# Patient Record
Sex: Male | Born: 1935 | Race: White | Hispanic: No | Smoking: Former smoker
Health system: Southern US, Community
[De-identification: ages and names within clinical notes are randomized; demographics above are authoritative.]

## PROBLEM LIST (undated history)

## (undated) DIAGNOSIS — Z7901 Long term (current) use of anticoagulants: Secondary | ICD-10-CM

## (undated) DIAGNOSIS — C61 Malignant neoplasm of prostate: Secondary | ICD-10-CM

## (undated) DIAGNOSIS — G459 Transient cerebral ischemic attack, unspecified: Secondary | ICD-10-CM

## (undated) DIAGNOSIS — I4510 Unspecified right bundle-branch block: Secondary | ICD-10-CM

## (undated) DIAGNOSIS — R29898 Other symptoms and signs involving the musculoskeletal system: Secondary | ICD-10-CM

## (undated) DIAGNOSIS — M489 Spondylopathy, unspecified: Secondary | ICD-10-CM

## (undated) DIAGNOSIS — I351 Nonrheumatic aortic (valve) insufficiency: Secondary | ICD-10-CM

## (undated) DIAGNOSIS — R001 Bradycardia, unspecified: Secondary | ICD-10-CM

## (undated) DIAGNOSIS — D472 Monoclonal gammopathy: Secondary | ICD-10-CM

## (undated) DIAGNOSIS — R197 Diarrhea, unspecified: Secondary | ICD-10-CM

## (undated) DIAGNOSIS — I739 Peripheral vascular disease, unspecified: Secondary | ICD-10-CM

## (undated) DIAGNOSIS — I4891 Unspecified atrial fibrillation: Secondary | ICD-10-CM

## (undated) DIAGNOSIS — D649 Anemia, unspecified: Secondary | ICD-10-CM

## (undated) DIAGNOSIS — I340 Nonrheumatic mitral (valve) insufficiency: Secondary | ICD-10-CM

## (undated) DIAGNOSIS — I350 Nonrheumatic aortic (valve) stenosis: Secondary | ICD-10-CM

## (undated) DIAGNOSIS — IMO0002 Reserved for concepts with insufficient information to code with codable children: Secondary | ICD-10-CM

## (undated) DIAGNOSIS — J209 Acute bronchitis, unspecified: Principal | ICD-10-CM

## (undated) DIAGNOSIS — K219 Gastro-esophageal reflux disease without esophagitis: Secondary | ICD-10-CM

## (undated) DIAGNOSIS — R413 Other amnesia: Secondary | ICD-10-CM

## (undated) DIAGNOSIS — S2231XA Fracture of one rib, right side, initial encounter for closed fracture: Secondary | ICD-10-CM

## (undated) DIAGNOSIS — I779 Disorder of arteries and arterioles, unspecified: Secondary | ICD-10-CM

## (undated) DIAGNOSIS — E785 Hyperlipidemia, unspecified: Secondary | ICD-10-CM

## (undated) DIAGNOSIS — Z79899 Other long term (current) drug therapy: Secondary | ICD-10-CM

## (undated) DIAGNOSIS — N289 Disorder of kidney and ureter, unspecified: Secondary | ICD-10-CM

## (undated) DIAGNOSIS — Z9079 Acquired absence of other genital organ(s): Secondary | ICD-10-CM

## (undated) DIAGNOSIS — R9430 Abnormal result of cardiovascular function study, unspecified: Secondary | ICD-10-CM

## (undated) DIAGNOSIS — I2510 Atherosclerotic heart disease of native coronary artery without angina pectoris: Secondary | ICD-10-CM

## (undated) DIAGNOSIS — R319 Hematuria, unspecified: Secondary | ICD-10-CM

## (undated) DIAGNOSIS — R0781 Pleurodynia: Secondary | ICD-10-CM

## (undated) HISTORY — DX: Acquired absence of other genital organ(s): Z90.79

## (undated) HISTORY — DX: Bradycardia, unspecified: R00.1

## (undated) HISTORY — DX: Spondylopathy, unspecified: M48.9

## (undated) HISTORY — DX: Other symptoms and signs involving the musculoskeletal system: R29.898

## (undated) HISTORY — DX: Acute bronchitis, unspecified: J20.9

## (undated) HISTORY — DX: Pleurodynia: R07.81

## (undated) HISTORY — DX: Abnormal result of cardiovascular function study, unspecified: R94.30

## (undated) HISTORY — DX: Reserved for concepts with insufficient information to code with codable children: IMO0002

## (undated) HISTORY — DX: Disorder of arteries and arterioles, unspecified: I77.9

## (undated) HISTORY — DX: Nonrheumatic mitral (valve) insufficiency: I34.0

## (undated) HISTORY — DX: Other long term (current) drug therapy: Z79.899

## (undated) HISTORY — DX: Nonrheumatic aortic (valve) insufficiency: I35.1

## (undated) HISTORY — PX: HERNIA REPAIR: SHX51

## (undated) HISTORY — DX: Long term (current) use of anticoagulants: Z79.01

## (undated) HISTORY — PX: EYE SURGERY: SHX253

## (undated) HISTORY — DX: Unspecified right bundle-branch block: I45.10

## (undated) HISTORY — DX: Other amnesia: R41.3

## (undated) HISTORY — DX: Malignant neoplasm of prostate: C61

## (undated) HISTORY — PX: OTHER SURGICAL HISTORY: SHX169

## (undated) HISTORY — DX: Hyperlipidemia, unspecified: E78.5

## (undated) HISTORY — DX: Atherosclerotic heart disease of native coronary artery without angina pectoris: I25.10

## (undated) HISTORY — PX: COLONOSCOPY: SHX174

## (undated) HISTORY — DX: Disorder of kidney and ureter, unspecified: N28.9

## (undated) HISTORY — DX: Unspecified atrial fibrillation: I48.91

## (undated) HISTORY — PX: CATARACT EXTRACTION: SUR2

## (undated) HISTORY — DX: Hematuria, unspecified: R31.9

## (undated) HISTORY — DX: Fracture of one rib, right side, initial encounter for closed fracture: S22.31XA

## (undated) HISTORY — PX: CARDIAC CATHETERIZATION: SHX172

## (undated) HISTORY — DX: Diarrhea, unspecified: R19.7

## (undated) HISTORY — DX: Nonrheumatic aortic (valve) stenosis: I35.0

## (undated) HISTORY — DX: Peripheral vascular disease, unspecified: I73.9

## (undated) HISTORY — DX: Monoclonal gammopathy: D47.2

## (undated) HISTORY — DX: Gastro-esophageal reflux disease without esophagitis: K21.9

## (undated) HISTORY — DX: Transient cerebral ischemic attack, unspecified: G45.9

---

## 1998-04-29 ENCOUNTER — Other Ambulatory Visit: Admission: RE | Admit: 1998-04-29 | Discharge: 1998-04-29 | Payer: Self-pay | Admitting: Urology

## 1998-11-25 ENCOUNTER — Encounter: Admission: RE | Admit: 1998-11-25 | Discharge: 1999-02-23 | Payer: Self-pay | Admitting: Radiation Oncology

## 1998-11-28 ENCOUNTER — Encounter: Payer: Self-pay | Admitting: Urology

## 1998-11-28 ENCOUNTER — Ambulatory Visit (HOSPITAL_COMMUNITY): Admission: RE | Admit: 1998-11-28 | Discharge: 1998-11-28 | Payer: Self-pay | Admitting: Urology

## 1998-12-01 ENCOUNTER — Encounter: Payer: Self-pay | Admitting: Urology

## 1998-12-01 ENCOUNTER — Ambulatory Visit (HOSPITAL_COMMUNITY): Admission: RE | Admit: 1998-12-01 | Discharge: 1998-12-01 | Payer: Self-pay | Admitting: Urology

## 1998-12-16 ENCOUNTER — Ambulatory Visit (HOSPITAL_COMMUNITY): Admission: RE | Admit: 1998-12-16 | Discharge: 1998-12-16 | Payer: Self-pay | Admitting: Urology

## 1998-12-16 ENCOUNTER — Encounter: Payer: Self-pay | Admitting: Urology

## 1999-01-08 ENCOUNTER — Ambulatory Visit (HOSPITAL_BASED_OUTPATIENT_CLINIC_OR_DEPARTMENT_OTHER): Admission: RE | Admit: 1999-01-08 | Discharge: 1999-01-08 | Payer: Self-pay | Admitting: Urology

## 1999-05-27 ENCOUNTER — Other Ambulatory Visit: Admission: RE | Admit: 1999-05-27 | Discharge: 1999-05-27 | Payer: Self-pay | Admitting: Oncology

## 2000-10-04 ENCOUNTER — Ambulatory Visit (HOSPITAL_COMMUNITY): Admission: RE | Admit: 2000-10-04 | Discharge: 2000-10-04 | Payer: Self-pay | Admitting: Neurology

## 2000-10-04 ENCOUNTER — Encounter: Payer: Self-pay | Admitting: Neurology

## 2000-12-16 ENCOUNTER — Encounter: Payer: Self-pay | Admitting: General Surgery

## 2000-12-16 ENCOUNTER — Encounter: Admission: RE | Admit: 2000-12-16 | Discharge: 2000-12-16 | Payer: Self-pay | Admitting: General Surgery

## 2000-12-19 ENCOUNTER — Ambulatory Visit (HOSPITAL_BASED_OUTPATIENT_CLINIC_OR_DEPARTMENT_OTHER): Admission: RE | Admit: 2000-12-19 | Discharge: 2000-12-20 | Payer: Self-pay | Admitting: *Deleted

## 2001-09-04 ENCOUNTER — Encounter: Payer: Self-pay | Admitting: Oncology

## 2001-09-04 ENCOUNTER — Ambulatory Visit (HOSPITAL_COMMUNITY): Admission: RE | Admit: 2001-09-04 | Discharge: 2001-09-04 | Payer: Self-pay | Admitting: Oncology

## 2002-01-10 ENCOUNTER — Ambulatory Visit (HOSPITAL_COMMUNITY): Admission: RE | Admit: 2002-01-10 | Discharge: 2002-01-10 | Payer: Self-pay | Admitting: Oncology

## 2002-01-10 ENCOUNTER — Encounter: Payer: Self-pay | Admitting: Oncology

## 2002-11-15 DIAGNOSIS — Z9079 Acquired absence of other genital organ(s): Secondary | ICD-10-CM

## 2002-11-15 HISTORY — DX: Acquired absence of other genital organ(s): Z90.79

## 2003-06-14 ENCOUNTER — Encounter: Payer: Self-pay | Admitting: Urology

## 2003-06-14 ENCOUNTER — Encounter (INDEPENDENT_AMBULATORY_CARE_PROVIDER_SITE_OTHER): Payer: Self-pay | Admitting: Specialist

## 2003-06-14 ENCOUNTER — Observation Stay (HOSPITAL_COMMUNITY): Admission: RE | Admit: 2003-06-14 | Discharge: 2003-06-15 | Payer: Self-pay | Admitting: Urology

## 2004-02-04 ENCOUNTER — Inpatient Hospital Stay (HOSPITAL_BASED_OUTPATIENT_CLINIC_OR_DEPARTMENT_OTHER): Admission: RE | Admit: 2004-02-04 | Discharge: 2004-02-04 | Payer: Self-pay | Admitting: Cardiology

## 2004-09-20 ENCOUNTER — Ambulatory Visit: Payer: Self-pay | Admitting: Oncology

## 2004-12-25 ENCOUNTER — Ambulatory Visit: Payer: Self-pay | Admitting: Cardiology

## 2005-03-19 ENCOUNTER — Ambulatory Visit: Payer: Self-pay | Admitting: Oncology

## 2005-09-17 ENCOUNTER — Ambulatory Visit: Payer: Self-pay | Admitting: Oncology

## 2006-01-24 ENCOUNTER — Ambulatory Visit: Payer: Self-pay | Admitting: Cardiology

## 2006-03-17 ENCOUNTER — Ambulatory Visit: Payer: Self-pay | Admitting: Oncology

## 2006-03-21 LAB — MORPHOLOGY: RBC Comments: NORMAL

## 2006-03-21 LAB — CBC WITH DIFFERENTIAL/PLATELET
EOS%: 3.8 % (ref 0.0–7.0)
Eosinophils Absolute: 0.3 10*3/uL (ref 0.0–0.5)
MCV: 96.9 fL (ref 81.6–98.0)
MONO%: 8.3 % (ref 0.0–13.0)
NEUT#: 4 10*3/uL (ref 1.5–6.5)
RBC: 3.85 10*6/uL — ABNORMAL LOW (ref 4.20–5.71)
RDW: 14 % (ref 11.2–14.6)
lymph#: 2.4 10*3/uL (ref 0.9–3.3)

## 2006-03-22 LAB — COMPREHENSIVE METABOLIC PANEL
AST: 23 U/L (ref 0–37)
Alkaline Phosphatase: 41 U/L (ref 39–117)
BUN: 19 mg/dL (ref 6–23)
Creatinine, Ser: 1.2 mg/dL (ref 0.4–1.5)
Potassium: 4.5 mEq/L (ref 3.5–5.3)

## 2006-03-22 LAB — PSA, TOTAL AND FREE
PSA, Free: 0.4 ng/mL
PSA: 2.32 ng/mL (ref 0.10–4.00)

## 2006-03-22 LAB — BETA 2 MICROGLOBULIN, SERUM: Beta-2 Microglobulin: 1.83 mg/L — ABNORMAL HIGH (ref 1.01–1.73)

## 2006-04-15 ENCOUNTER — Ambulatory Visit: Payer: Self-pay | Admitting: Cardiology

## 2006-08-18 ENCOUNTER — Ambulatory Visit: Payer: Self-pay | Admitting: Oncology

## 2006-08-26 LAB — UIFE/LIGHT CHAINS/TP QN, 24-HR UR
Albumin, U: DETECTED
Free Kappa Lt Chains,Ur: 19.2 mg/dL — ABNORMAL HIGH (ref 0.04–1.51)
Free Kappa/Lambda Ratio: 68.57 ratio — ABNORMAL HIGH (ref 0.46–4.00)
Free Lambda Excretion/Day: 3.92 mg/d
Time: 24 hours
Total Protein, Urine-Ur/day: 302 mg/d — ABNORMAL HIGH (ref 10–140)
Total Protein, Urine: 21.6 mg/dL

## 2007-01-23 ENCOUNTER — Ambulatory Visit: Payer: Self-pay | Admitting: Cardiology

## 2007-01-27 ENCOUNTER — Ambulatory Visit: Payer: Self-pay

## 2007-02-08 ENCOUNTER — Ambulatory Visit: Payer: Self-pay | Admitting: Oncology

## 2007-02-13 LAB — CBC WITH DIFFERENTIAL/PLATELET
BASO%: 0.3 % (ref 0.0–2.0)
Eosinophils Absolute: 0.1 10*3/uL (ref 0.0–0.5)
LYMPH%: 25 % (ref 14.0–48.0)
MCHC: 35 g/dL (ref 32.0–35.9)
MONO#: 0.7 10*3/uL (ref 0.1–0.9)
MONO%: 11.2 % (ref 0.0–13.0)
NEUT#: 4 10*3/uL (ref 1.5–6.5)
Platelets: 246 10*3/uL (ref 145–400)
RBC: 3.77 10*6/uL — ABNORMAL LOW (ref 4.20–5.71)
RDW: 13.8 % (ref 11.2–14.6)
WBC: 6.5 10*3/uL (ref 4.0–10.0)

## 2007-02-13 LAB — MORPHOLOGY: RBC Comments: NORMAL

## 2007-02-13 LAB — LIPID PANEL
LDL Cholesterol: 101 mg/dL — ABNORMAL HIGH (ref 0–99)
Total CHOL/HDL Ratio: 3.4 Ratio
VLDL: 12 mg/dL (ref 0–40)

## 2007-02-16 LAB — COMPREHENSIVE METABOLIC PANEL
AST: 19 U/L (ref 0–37)
Alkaline Phosphatase: 51 U/L (ref 39–117)
BUN: 15 mg/dL (ref 6–23)
Glucose, Bld: 97 mg/dL (ref 70–99)
Potassium: 4.2 mEq/L (ref 3.5–5.3)
Sodium: 140 mEq/L (ref 135–145)
Total Bilirubin: 0.8 mg/dL (ref 0.3–1.2)

## 2007-02-16 LAB — LACTATE DEHYDROGENASE: LDH: 126 U/L (ref 94–250)

## 2007-02-16 LAB — UIFE/LIGHT CHAINS/TP QN, 24-HR UR
Albumin, U: DETECTED
Free Lambda Excretion/Day: 10.36 mg/d
Gamma Globulin, Urine: DETECTED — AB

## 2007-02-16 LAB — KAPPA/LAMBDA LIGHT CHAINS: Kappa:Lambda Ratio: 1.64 (ref 0.26–1.65)

## 2007-02-16 LAB — PSA, TOTAL AND FREE: PSA, Free Pct: 13 % — ABNORMAL LOW (ref >25)

## 2007-02-16 LAB — IGG: IgG (Immunoglobin G), Serum: 2120 mg/dL — ABNORMAL HIGH (ref 694–1618)

## 2007-04-12 ENCOUNTER — Encounter: Admission: RE | Admit: 2007-04-12 | Discharge: 2007-04-12 | Payer: Self-pay | Admitting: Gastroenterology

## 2007-06-14 ENCOUNTER — Ambulatory Visit: Payer: Self-pay | Admitting: Oncology

## 2007-06-16 LAB — LIPID PANEL
LDL Cholesterol: 92 mg/dL (ref 0–99)
Triglycerides: 92 mg/dL (ref <150)

## 2007-06-16 LAB — CBC WITH DIFFERENTIAL/PLATELET
Eosinophils Absolute: 0.3 10*3/uL (ref 0.0–0.5)
HGB: 12.6 g/dL — ABNORMAL LOW (ref 13.0–17.1)
MCV: 96.1 fL (ref 81.6–98.0)
MONO#: 0.7 10*3/uL (ref 0.1–0.9)
MONO%: 9.5 % (ref 0.0–13.0)
NEUT#: 4.2 10*3/uL (ref 1.5–6.5)
RBC: 3.69 10*6/uL — ABNORMAL LOW (ref 4.20–5.71)
RDW: 14 % (ref 11.2–14.6)
WBC: 7.7 10*3/uL (ref 4.0–10.0)
lymph#: 2.5 10*3/uL (ref 0.9–3.3)

## 2007-06-16 LAB — MORPHOLOGY: PLT EST: ADEQUATE

## 2007-06-18 LAB — COMPREHENSIVE METABOLIC PANEL
ALT: 17 U/L (ref 0–53)
Albumin: 4.3 g/dL (ref 3.5–5.2)
Alkaline Phosphatase: 45 U/L (ref 39–117)
CO2: 24 mEq/L (ref 19–32)
Glucose, Bld: 88 mg/dL (ref 70–99)
Potassium: 4.3 mEq/L (ref 3.5–5.3)
Sodium: 142 mEq/L (ref 135–145)
Total Protein: 8.2 g/dL (ref 6.0–8.3)

## 2007-06-18 LAB — BETA 2 MICROGLOBULIN, SERUM: Beta-2 Microglobulin: 2.05 mg/L — ABNORMAL HIGH (ref 1.01–1.73)

## 2007-06-18 LAB — PSA, TOTAL AND FREE: PSA: 2.53 ng/mL (ref 0.10–4.00)

## 2007-06-21 LAB — UIFE/LIGHT CHAINS/TP QN, 24-HR UR
Albumin, U: DETECTED
Free Kappa/Lambda Ratio: 58.1 ratio — ABNORMAL HIGH (ref 0.46–4.00)
Free Lambda Lt Chains,Ur: 0.21 mg/dL (ref 0.08–1.01)
Free Lt Chn Excr Rate: 151.28 mg/d
Total Protein, Urine-Ur/day: 166 mg/d — ABNORMAL HIGH (ref 10–140)
Total Protein, Urine: 13.4 mg/dL

## 2007-07-12 ENCOUNTER — Ambulatory Visit (HOSPITAL_COMMUNITY): Admission: RE | Admit: 2007-07-12 | Discharge: 2007-07-12 | Payer: Self-pay | Admitting: Gastroenterology

## 2007-10-19 ENCOUNTER — Ambulatory Visit: Payer: Self-pay | Admitting: Oncology

## 2007-10-23 LAB — CBC WITH DIFFERENTIAL/PLATELET
BASO%: 0.3 % (ref 0.0–2.0)
EOS%: 5.4 % (ref 0.0–7.0)
HGB: 13.1 g/dL (ref 13.0–17.1)
MCH: 33.7 pg — ABNORMAL HIGH (ref 28.0–33.4)
MCHC: 34.6 g/dL (ref 32.0–35.9)
RDW: 13.4 % (ref 11.2–14.6)
lymph#: 2.2 10*3/uL (ref 0.9–3.3)

## 2007-10-23 LAB — COMPREHENSIVE METABOLIC PANEL
BUN: 16 mg/dL (ref 6–23)
CO2: 25 mEq/L (ref 19–32)
Calcium: 9.1 mg/dL (ref 8.4–10.5)
Chloride: 107 mEq/L (ref 96–112)
Creatinine, Ser: 1.22 mg/dL (ref 0.40–1.50)
Total Bilirubin: 0.8 mg/dL (ref 0.3–1.2)

## 2007-10-23 LAB — MORPHOLOGY

## 2007-10-23 LAB — IGG: IgG (Immunoglobin G), Serum: 2250 mg/dL — ABNORMAL HIGH (ref 694–1618)

## 2007-10-23 LAB — KAPPA/LAMBDA LIGHT CHAINS
Kappa free light chain: 1.1 mg/dL (ref 0.33–1.94)
Lambda Free Lght Chn: 1.15 mg/dL (ref 0.57–2.63)

## 2007-10-23 LAB — LACTATE DEHYDROGENASE: LDH: 133 U/L (ref 94–250)

## 2007-10-26 LAB — UIFE/LIGHT CHAINS/TP QN, 24-HR UR
Alpha 2, Urine: DETECTED — AB
Beta, Urine: DETECTED — AB
Free Kappa Lt Chains,Ur: 11.7 mg/dL — ABNORMAL HIGH (ref 0.04–1.51)
Free Kappa/Lambda Ratio: 78 ratio — ABNORMAL HIGH (ref 0.46–4.00)
Free Lambda Lt Chains,Ur: 0.15 mg/dL (ref 0.08–1.01)
Free Lt Chn Excr Rate: 272.03 mg/d
Total Protein, Urine-Ur/day: 293 mg/d — ABNORMAL HIGH (ref 10–140)
Total Protein, Urine: 12.6 mg/dL
Volume, Urine: 2325 mL

## 2007-11-16 DIAGNOSIS — I340 Nonrheumatic mitral (valve) insufficiency: Secondary | ICD-10-CM

## 2007-11-16 HISTORY — DX: Nonrheumatic mitral (valve) insufficiency: I34.0

## 2008-03-06 ENCOUNTER — Ambulatory Visit: Payer: Self-pay | Admitting: Cardiology

## 2008-03-13 ENCOUNTER — Ambulatory Visit: Payer: Self-pay

## 2008-03-13 ENCOUNTER — Encounter: Payer: Self-pay | Admitting: Cardiology

## 2008-04-18 ENCOUNTER — Ambulatory Visit: Payer: Self-pay | Admitting: Oncology

## 2008-04-22 LAB — CBC WITH DIFFERENTIAL/PLATELET
EOS%: 5.1 % (ref 0.0–7.0)
Eosinophils Absolute: 0.3 10*3/uL (ref 0.0–0.5)
MCV: 96.3 fL (ref 81.6–98.0)
MONO%: 9.9 % (ref 0.0–13.0)
NEUT#: 3 10*3/uL (ref 1.5–6.5)
RBC: 3.81 10*6/uL — ABNORMAL LOW (ref 4.20–5.71)
RDW: 13.7 % (ref 11.2–14.6)

## 2008-04-22 LAB — MORPHOLOGY: PLT EST: ADEQUATE

## 2008-04-23 LAB — IGG: IgG (Immunoglobin G), Serum: 2400 mg/dL — ABNORMAL HIGH (ref 694–1618)

## 2008-04-23 LAB — COMPREHENSIVE METABOLIC PANEL
BUN: 22 mg/dL (ref 6–23)
CO2: 24 mEq/L (ref 19–32)
Calcium: 8.9 mg/dL (ref 8.4–10.5)
Creatinine, Ser: 1.22 mg/dL (ref 0.40–1.50)
Glucose, Bld: 99 mg/dL (ref 70–99)
Total Bilirubin: 0.7 mg/dL (ref 0.3–1.2)

## 2008-04-23 LAB — KAPPA/LAMBDA LIGHT CHAINS
Kappa free light chain: 1.61 mg/dL (ref 0.33–1.94)
Kappa:Lambda Ratio: 1.31 (ref 0.26–1.65)
Lambda Free Lght Chn: 1.23 mg/dL (ref 0.57–2.63)

## 2008-04-23 LAB — LACTATE DEHYDROGENASE: LDH: 135 U/L (ref 94–250)

## 2008-04-29 LAB — UIFE/LIGHT CHAINS/TP QN, 24-HR UR
Alpha 1, Urine: DETECTED — AB
Alpha 2, Urine: DETECTED — AB
Beta, Urine: DETECTED — AB
Free Kappa Lt Chains,Ur: 15.7 mg/dL — ABNORMAL HIGH (ref 0.04–1.51)
Free Lt Chn Excr Rate: 231.58 mg/d

## 2008-10-17 ENCOUNTER — Ambulatory Visit: Payer: Self-pay | Admitting: Oncology

## 2008-10-21 LAB — CBC WITH DIFFERENTIAL/PLATELET
Basophils Absolute: 0 10*3/uL (ref 0.0–0.1)
Eosinophils Absolute: 0.3 10*3/uL (ref 0.0–0.5)
HCT: 37.7 % — ABNORMAL LOW (ref 38.7–49.9)
HGB: 12.9 g/dL — ABNORMAL LOW (ref 13.0–17.1)
LYMPH%: 34 % (ref 14.0–48.0)
MCV: 99.1 fL — ABNORMAL HIGH (ref 81.6–98.0)
MONO#: 0.5 10*3/uL (ref 0.1–0.9)
MONO%: 8.4 % (ref 0.0–13.0)
NEUT#: 2.9 10*3/uL (ref 1.5–6.5)
NEUT%: 51.7 % (ref 40.0–75.0)
Platelets: 226 10*3/uL (ref 145–400)
WBC: 5.6 10*3/uL (ref 4.0–10.0)

## 2008-10-21 LAB — MORPHOLOGY: PLT EST: ADEQUATE

## 2008-10-23 LAB — IGG: IgG (Immunoglobin G), Serum: 2110 mg/dL — ABNORMAL HIGH (ref 694–1618)

## 2008-10-23 LAB — COMPREHENSIVE METABOLIC PANEL
ALT: 14 U/L (ref 0–53)
Albumin: 3.9 g/dL (ref 3.5–5.2)
CO2: 25 mEq/L (ref 19–32)
Calcium: 8.9 mg/dL (ref 8.4–10.5)
Chloride: 106 mEq/L (ref 96–112)
Potassium: 4.3 mEq/L (ref 3.5–5.3)
Sodium: 138 mEq/L (ref 135–145)
Total Bilirubin: 0.7 mg/dL (ref 0.3–1.2)
Total Protein: 7.5 g/dL (ref 6.0–8.3)

## 2008-10-23 LAB — LIPID PANEL
Cholesterol: 163 mg/dL (ref 0–200)
Total CHOL/HDL Ratio: 2.6 Ratio
VLDL: 15 mg/dL (ref 0–40)

## 2009-04-21 ENCOUNTER — Encounter: Payer: Self-pay | Admitting: Cardiology

## 2009-04-21 ENCOUNTER — Ambulatory Visit: Payer: Self-pay | Admitting: Oncology

## 2009-04-21 LAB — CBC WITH DIFFERENTIAL/PLATELET
BASO%: 0.1 % (ref 0.0–2.0)
Basophils Absolute: 0 10*3/uL (ref 0.0–0.1)
HCT: 35.1 % — ABNORMAL LOW (ref 38.4–49.9)
HGB: 12 g/dL — ABNORMAL LOW (ref 13.0–17.1)
MCHC: 34.2 g/dL (ref 32.0–36.0)
MONO#: 0.5 10*3/uL (ref 0.1–0.9)
NEUT%: 53.3 % (ref 39.0–75.0)
RDW: 13.3 % (ref 11.0–14.6)
WBC: 6.7 10*3/uL (ref 4.0–10.3)
lymph#: 2.4 10*3/uL (ref 0.9–3.3)

## 2009-04-21 LAB — LIPID PANEL
Cholesterol: 158 mg/dL (ref 0–200)
HDL: 53 mg/dL (ref >39)
Total CHOL/HDL Ratio: 3 Ratio
VLDL: 13 mg/dL (ref 0–40)

## 2009-04-22 LAB — COMPREHENSIVE METABOLIC PANEL
ALT: 14 U/L (ref 0–53)
Albumin: 3.8 g/dL (ref 3.5–5.2)
CO2: 24 mEq/L (ref 19–32)
Calcium: 9.3 mg/dL (ref 8.4–10.5)
Chloride: 108 mEq/L (ref 96–112)
Creatinine, Ser: 1.25 mg/dL (ref 0.40–1.50)
Potassium: 4.2 mEq/L (ref 3.5–5.3)

## 2009-04-22 LAB — LACTATE DEHYDROGENASE: LDH: 123 U/L (ref 94–250)

## 2009-04-22 LAB — IGG: IgG (Immunoglobin G), Serum: 2110 mg/dL — ABNORMAL HIGH (ref 694–1618)

## 2009-04-22 LAB — KAPPA/LAMBDA LIGHT CHAINS: Kappa:Lambda Ratio: 1.31 (ref 0.26–1.65)

## 2009-04-28 ENCOUNTER — Encounter: Payer: Self-pay | Admitting: Cardiology

## 2009-04-30 LAB — UIFE/LIGHT CHAINS/TP QN, 24-HR UR
Albumin, U: DETECTED
Alpha 1, Urine: DETECTED — AB
Beta, Urine: DETECTED — AB
Free Kappa Lt Chains,Ur: 9.05 mg/dL — ABNORMAL HIGH (ref 0.04–1.51)
Free Lambda Excretion/Day: 3.33 mg/d
Gamma Globulin, Urine: DETECTED — AB
Time: 24 hours

## 2009-05-05 ENCOUNTER — Encounter: Payer: Self-pay | Admitting: Cardiology

## 2009-10-23 ENCOUNTER — Ambulatory Visit: Payer: Self-pay | Admitting: Oncology

## 2009-10-27 ENCOUNTER — Encounter: Payer: Self-pay | Admitting: Cardiology

## 2009-10-27 LAB — CBC WITH DIFFERENTIAL/PLATELET
BASO%: 1.4 % (ref 0.0–2.0)
EOS%: 3.1 % (ref 0.0–7.0)
MCH: 34 pg — ABNORMAL HIGH (ref 27.2–33.4)
MCHC: 33.7 g/dL (ref 32.0–36.0)
MCV: 100.9 fL — ABNORMAL HIGH (ref 79.3–98.0)
MONO%: 7.4 % (ref 0.0–14.0)
NEUT#: 4.4 10*3/uL (ref 1.5–6.5)
RBC: 3.8 10*6/uL — ABNORMAL LOW (ref 4.20–5.82)
RDW: 13.9 % (ref 11.0–14.6)

## 2009-10-27 LAB — MORPHOLOGY

## 2009-10-28 LAB — KAPPA/LAMBDA LIGHT CHAINS
Kappa free light chain: 1.49 mg/dL (ref 0.33–1.94)
Kappa:Lambda Ratio: 1.03 (ref 0.26–1.65)

## 2009-10-28 LAB — COMPREHENSIVE METABOLIC PANEL
Alkaline Phosphatase: 44 U/L (ref 39–117)
BUN: 15 mg/dL (ref 6–23)
CO2: 26 mEq/L (ref 19–32)
Glucose, Bld: 88 mg/dL (ref 70–99)
Total Bilirubin: 0.6 mg/dL (ref 0.3–1.2)

## 2009-10-28 LAB — LACTATE DEHYDROGENASE: LDH: 134 U/L (ref 94–250)

## 2009-10-28 LAB — IGG: IgG (Immunoglobin G), Serum: 2140 mg/dL — ABNORMAL HIGH (ref 694–1618)

## 2009-10-28 LAB — PSA: PSA: 1.27 ng/mL (ref 0.10–4.00)

## 2010-04-23 ENCOUNTER — Ambulatory Visit: Payer: Self-pay | Admitting: Oncology

## 2010-04-27 ENCOUNTER — Encounter: Payer: Self-pay | Admitting: Cardiology

## 2010-04-27 LAB — CBC WITH DIFFERENTIAL/PLATELET
Eosinophils Absolute: 0.2 10*3/uL (ref 0.0–0.5)
MONO#: 0.5 10*3/uL (ref 0.1–0.9)
NEUT#: 4.1 10*3/uL (ref 1.5–6.5)
RBC: 3.67 10*6/uL — ABNORMAL LOW (ref 4.20–5.82)
RDW: 13.4 % (ref 11.0–14.6)
WBC: 7.2 10*3/uL (ref 4.0–10.3)
lymph#: 2.4 10*3/uL (ref 0.9–3.3)

## 2010-04-27 LAB — MORPHOLOGY: RBC Comments: NORMAL

## 2010-04-28 LAB — COMPREHENSIVE METABOLIC PANEL
AST: 19 U/L (ref 0–37)
Albumin: 3.9 g/dL (ref 3.5–5.2)
Alkaline Phosphatase: 41 U/L (ref 39–117)
BUN: 18 mg/dL (ref 6–23)
Potassium: 4.2 mEq/L (ref 3.5–5.3)
Sodium: 139 mEq/L (ref 135–145)

## 2010-04-28 LAB — KAPPA/LAMBDA LIGHT CHAINS: Kappa:Lambda Ratio: 1.78 — ABNORMAL HIGH (ref 0.26–1.65)

## 2010-04-28 LAB — LIPID PANEL
Cholesterol: 155 mg/dL (ref 0–200)
Total CHOL/HDL Ratio: 2.8 Ratio

## 2010-04-28 LAB — IGG: IgG (Immunoglobin G), Serum: 2110 mg/dL — ABNORMAL HIGH (ref 694–1618)

## 2010-05-04 ENCOUNTER — Encounter: Payer: Self-pay | Admitting: Cardiology

## 2010-05-06 ENCOUNTER — Encounter: Payer: Self-pay | Admitting: Cardiology

## 2010-06-13 ENCOUNTER — Ambulatory Visit: Payer: Self-pay | Admitting: Cardiology

## 2010-06-13 ENCOUNTER — Inpatient Hospital Stay (HOSPITAL_COMMUNITY): Admission: EM | Admit: 2010-06-13 | Discharge: 2010-06-18 | Payer: Self-pay | Admitting: Emergency Medicine

## 2010-06-14 ENCOUNTER — Encounter (INDEPENDENT_AMBULATORY_CARE_PROVIDER_SITE_OTHER): Payer: Self-pay | Admitting: Internal Medicine

## 2010-06-14 ENCOUNTER — Encounter: Payer: Self-pay | Admitting: Cardiology

## 2010-06-18 ENCOUNTER — Encounter (INDEPENDENT_AMBULATORY_CARE_PROVIDER_SITE_OTHER): Payer: Self-pay | Admitting: Internal Medicine

## 2010-06-18 ENCOUNTER — Encounter: Payer: Self-pay | Admitting: Cardiology

## 2010-07-14 ENCOUNTER — Encounter: Payer: Self-pay | Admitting: Cardiology

## 2010-07-15 ENCOUNTER — Ambulatory Visit: Payer: Self-pay | Admitting: Cardiology

## 2010-07-31 ENCOUNTER — Ambulatory Visit: Payer: Self-pay | Admitting: Cardiology

## 2010-08-03 ENCOUNTER — Telehealth: Payer: Self-pay | Admitting: Cardiology

## 2010-08-10 ENCOUNTER — Ambulatory Visit: Payer: Self-pay | Admitting: Cardiology

## 2010-08-12 LAB — CONVERTED CEMR LAB
Basophils Absolute: 0.1 10*3/uL (ref 0.0–0.1)
Basophils Relative: 0.8 % (ref 0.0–3.0)
CO2: 28 meq/L (ref 19–32)
Calcium: 8.6 mg/dL (ref 8.4–10.5)
Chloride: 109 meq/L (ref 96–112)
Creatinine, Ser: 1.7 mg/dL — ABNORMAL HIGH (ref 0.4–1.5)
Eosinophils Absolute: 0.4 10*3/uL (ref 0.0–0.7)
Glucose, Bld: 90 mg/dL (ref 70–99)
MCHC: 34.4 g/dL (ref 30.0–36.0)
MCV: 100.3 fL — ABNORMAL HIGH (ref 78.0–100.0)
Monocytes Absolute: 0.7 10*3/uL (ref 0.1–1.0)
Neutro Abs: 4.4 10*3/uL (ref 1.4–7.7)
Neutrophils Relative %: 52.6 % (ref 43.0–77.0)
RBC: 3.44 M/uL — ABNORMAL LOW (ref 4.22–5.81)
RDW: 13.5 % (ref 11.5–14.6)

## 2010-08-13 ENCOUNTER — Telehealth: Payer: Self-pay | Admitting: Cardiology

## 2010-08-17 ENCOUNTER — Ambulatory Visit: Payer: Self-pay | Admitting: Cardiology

## 2010-08-17 LAB — CONVERTED CEMR LAB
CO2: 23 meq/L (ref 19–32)
Calcium: 8.9 mg/dL (ref 8.4–10.5)
Creatinine, Ser: 1.8 mg/dL — ABNORMAL HIGH (ref 0.4–1.5)
GFR calc non Af Amer: 39.48 mL/min (ref >60)
Glucose, Bld: 91 mg/dL (ref 70–99)

## 2010-08-24 ENCOUNTER — Ambulatory Visit (HOSPITAL_COMMUNITY): Admission: RE | Admit: 2010-08-24 | Discharge: 2010-08-24 | Payer: Self-pay | Admitting: Oncology

## 2010-08-24 ENCOUNTER — Encounter: Payer: Self-pay | Admitting: Cardiology

## 2010-08-26 ENCOUNTER — Ambulatory Visit: Payer: Self-pay | Admitting: Oncology

## 2010-08-26 ENCOUNTER — Encounter: Payer: Self-pay | Admitting: Cardiology

## 2010-08-26 ENCOUNTER — Other Ambulatory Visit: Payer: Self-pay | Admitting: Oncology

## 2010-08-28 LAB — CREATININE CLEARANCE, URINE, 24 HOUR
Creatinine, Urine: 77.5 mg/dL
Creatinine: 1.61 mg/dL — ABNORMAL HIGH (ref 0.40–1.50)

## 2010-08-28 LAB — UIFE/LIGHT CHAINS/TP QN, 24-HR UR
Albumin, U: DETECTED
Alpha 1, Urine: DETECTED — AB
Free Kappa/Lambda Ratio: 39.77 ratio — ABNORMAL HIGH (ref 0.46–4.00)
Free Lambda Excretion/Day: 4.95 mg/d
Time: 24 hours
Total Protein, Urine: 9.7 mg/dL

## 2010-09-10 ENCOUNTER — Ambulatory Visit: Payer: Self-pay | Admitting: Cardiology

## 2010-10-14 ENCOUNTER — Encounter: Payer: Self-pay | Admitting: Cardiology

## 2010-10-14 ENCOUNTER — Ambulatory Visit: Payer: Self-pay | Admitting: Cardiology

## 2010-10-14 DIAGNOSIS — R197 Diarrhea, unspecified: Secondary | ICD-10-CM

## 2010-11-06 ENCOUNTER — Ambulatory Visit: Payer: Self-pay | Admitting: Oncology

## 2010-11-11 ENCOUNTER — Encounter: Payer: Self-pay | Admitting: Cardiology

## 2010-11-11 LAB — CBC WITH DIFFERENTIAL/PLATELET
Basophils Absolute: 0 10*3/uL (ref 0.0–0.1)
EOS%: 4.8 % (ref 0.0–7.0)
Eosinophils Absolute: 0.4 10*3/uL (ref 0.0–0.5)
HCT: 34.8 % — ABNORMAL LOW (ref 38.4–49.9)
HGB: 12 g/dL — ABNORMAL LOW (ref 13.0–17.1)
MCH: 34.2 pg — ABNORMAL HIGH (ref 27.2–33.4)
MCV: 99.2 fL — ABNORMAL HIGH (ref 79.3–98.0)
NEUT#: 4.3 10*3/uL (ref 1.5–6.5)
NEUT%: 53.7 % (ref 39.0–75.0)
lymph#: 2.5 10*3/uL (ref 0.9–3.3)

## 2010-11-11 LAB — MORPHOLOGY

## 2010-11-12 LAB — COMPREHENSIVE METABOLIC PANEL
ALT: 17 U/L (ref 0–53)
AST: 22 U/L (ref 0–37)
Albumin: 3.8 g/dL (ref 3.5–5.2)
Alkaline Phosphatase: 45 U/L (ref 39–117)
BUN: 16 mg/dL (ref 6–23)
Calcium: 8.7 mg/dL (ref 8.4–10.5)
Chloride: 106 mEq/L (ref 96–112)
Potassium: 4.1 mEq/L (ref 3.5–5.3)
Sodium: 137 mEq/L (ref 135–145)
Total Protein: 7.3 g/dL (ref 6.0–8.3)

## 2010-11-12 LAB — KAPPA/LAMBDA LIGHT CHAINS
Kappa free light chain: 2.33 mg/dL — ABNORMAL HIGH (ref 0.33–1.94)
Kappa:Lambda Ratio: 1.45 (ref 0.26–1.65)
Lambda Free Lght Chn: 1.61 mg/dL (ref 0.57–2.63)

## 2010-11-12 LAB — LACTATE DEHYDROGENASE: LDH: 140 U/L (ref 94–250)

## 2010-11-12 LAB — IGG: IgG (Immunoglobin G), Serum: 2600 mg/dL — ABNORMAL HIGH (ref 694–1618)

## 2010-11-23 ENCOUNTER — Encounter: Payer: Self-pay | Admitting: Cardiology

## 2010-12-02 ENCOUNTER — Telehealth: Payer: Self-pay | Admitting: Cardiology

## 2010-12-02 ENCOUNTER — Encounter: Payer: Self-pay | Admitting: Cardiology

## 2010-12-15 NOTE — Assessment & Plan Note (Signed)
Summary: [name] [name]   Visit Type:  post hospitalization Referring Provider:  Cammie Mcgee,  CC:  atrial fibrillation.  History of Present Illness: The patient is seen post hospitalization for atrial fibrillation.  He developed atrial fibrillation and noted a rapid heart rate.  He also had some mild tightness in his throat.  He was admitted and ultimately underwent cardiac catheterization in August, 2011.  He had 30% lesions in all 3 major vessels.  There was no significant obstructive disease.  3 echo revealed an ejection fraction of 60%.  He was started on Pradaxa as he refused Coumadin.  He is aware that this drug is not formally recommended in this age group.  After the loading dose he had a TEE cardioversion and he went home.  He's had no palpitations since then.   The patient is very concerned that he is not on aspirin.  Historically he had a TIA in the past.  It is well documented that this occurred approximately 10 days after he had held his aspirin.  He was sent home on Pradaxa and no aspirin.  He was also told he could not use Aleve.  I've had an extensive discussion with him about this.  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  Current Medications (verified): 1)  Lipitor 10 Mg Tabs (Atorvastatin Calcium) .... Take One Tablet By Mouth Daily. 2)  Atenolol 50 Mg Tabs (Atenolol) .... Take One Tablet By Mouth in Am and One Half in The Pm 3)  Pradaxa 150 Mg Caps (Dabigatran Etexilate Mesylate) .... Take 1 Tablet By Mouth Two Times A Day  Allergies (verified): 1)  ! Penicillin  Past History:  Past Medical History: CAD   minimal...cath....2005  /   cath 06/2010..30% lesions in three vessels...normal LV EF   60%...echo.Marland KitchenMarland Kitchen4/2009  /  normal  TEE and cath...06/2010  /  60%...echo.Marland Kitchen.05/2010 Hyperlipidemia Atrial fibrillation  05/2010.[facility]Hospital..Medications reviewed with patient for rate.Marland KitchenMarland KitchenPradaxa started...TEE cardioversion on full dose Pradaxa...home later  that day Pradaxa Rx Monoclonal Gamopathy     [place] TIA    question in past....Dr. Sandria Manly  (off ASA for 10 days at that time) Aortic stenosis   mild....echo.Marland KitchenMarland Kitchen4/2009  /  minimal by TEE...06/2010 /  mild...echo,,,05/2010 GERD   esophageal dilitation   1992...some esophageal spasm Transurethral prostate ablation 2004 AI   mild...echo.Marland KitchenMarland Kitchen4/2009  /   mild  by TEE  06/2010  /  mild...echo.Marland Kitchen.05/2010 MR    mild....echo....02/2008  /   mild..TEE...06/2010  /  mild...echo... 05/2010 Cervical spine disease Carotid doppler 2008...Marland Kitchenno significant abnormality  Review of Systems       Patient denies fever, chills, headache, sweats, rash, change in vision, change in hearing, chest pain, cough, nausea vomiting, urinary symptoms.  All other systems are reviewed and are negative.  Vital Signs:  Patient profile:   77 year old male Height:      69 inches Weight:      197 pounds BMI:     29.20 Pulse rate:   51 / minute BP sitting:   118 / 80  (left arm) Cuff size:   regular  Vitals Entered By: Hardin Negus, RMA (July 15, 2010 11:47 AM)  Physical Exam  General:  patient is quite stable today. Eyes:  no xanthelasma. Neck:  no jugular venous distention. Lungs:  lungs are clear.  Respiratory effort is nonlabored. Heart:  cardiac exam reveals S1-S2.  There is a soft systolic murmur. Abdomen:  abdomen is soft. Extremities:  no  peripheral edema. Psych:  patient is oriented to person time and place.  Affect is normal.   Impression & Recommendations:  Problem # 1:  ATRIAL FIBRILLATION, PAROXYSMAL (ICD-427.31)  His updated medication list for this problem includes:    Atenolol 50 Mg Tabs (Atenolol) .Marland Kitchen... Take one tablet by mouth in am and one half in the pm  Orders: EKG w/ Interpretation (93000) EKG is done today and reviewed by me.  There is sinus rhythm with sinus bradycardia.  He is on Pradaxa.  He refused Coumadin.  Problem # 2:  GERD (ICD-530.81) His symptoms are treated.  He is  stable.  Problem # 3:  MITRAL REGURGITATION (ICD-396.3) We know that his mitral regurgitation and aortic insufficiency are stable as well his his mild aortic stenosis.  Recent echo data shows this.  Problem # 4:  CAD, NATIVE VESSEL (ICD-414.01)  His updated medication list for this problem includes:    Atenolol 50 Mg Tabs (Atenolol) .Marland Kitchen... Take one tablet by mouth in am and one half in the pm catheterization revealed that he has mild coronary disease..  Intervention was not needed.  The patient has felt fatigue and wonders if it could be related to his atenolol dose.  He does have bradycardia.  The dose will be decreased to 25 mg b.i.d.  Problem # 5:  * TIA The patient is very concerned about being off aspirin.  He is well aware of the potential risks of aspirin plus Pradaxa, but this is his choice.  He will take 81 mg of aspirin 3 days per week on the days that he uses his PPI for GERD.  Problem # 6:  * PRADAXA RX As outlined this drug is not formally recommended in this age group.  Patient refused Coumadin.  He has chosen to be on the drug.  He is very well informed.  Problem # 7:  * ARTHRITIS The patient is used to using Aleve for his aches and pains.  He is limited without it.  It is not contraindicated with Pradaxa.  He and I discussed this at length.  He will use the job very sparingly.  Patient Instructions: 1)  Your physician recommends that you schedule a follow-up appointment in: 8 weeks. 2)  Your physician recommends that you continue on your current medications as directed. Please refer to the Current Medication list given to you today.

## 2010-12-15 NOTE — Progress Notes (Signed)
Summary: major sided effect from meds  Phone Note Call from Patient Call back at Home Phone [phone]   Caller: Patient Reason for Call: Talk to Nurse Summary of Call: c/o major sided effects from meds. METOPROLOL TARTRATE 25 MG / dronedraone 400 mg. pt wanted to know should he come in today for echo .  Initial call taken by: Lorne Skeens,  August 13, 2010 9:15 AM  Follow-up for Phone Call        spoke w/pt he states he feels like last night he flipped back into sinus rythm, but he is having horrible side effects from med, he has been having diarrhea which starts around 2am and last until after breakfast, he is very weak and generally feels bad, will discuss w/Dr Myrtis Ser and call him back Meredith Staggers, RN  August 13, 2010 9:44 AM   have been unable to reach Dr Myrtis Ser, discussed w/Dr Shirlee Latch and Kennon Rounds, phmD they both agree that diarrhea usually gets better as pts cont. taking med, discussed this w/pt he states it is much more than just diarrhea, and just doesn't know if he can handle the way he felt this am, he is not explaining what that means and ask to speak w/Dr Jens Som as he saw him in the hospital, have spoken w/Dr Jens Som and he will call pt later Meredith Staggers, RN  August 13, 2010 2:17 PM   Additional Follow-up for Phone Call Additional follow up Details #1::        Spoke with patient; is having diarrhea and fatigue; explained that diarrhea may resolve after one week and that he should take multaq with food; he is agreeable. Will decrease toprol to 12.5 two times a day for fatigue. If symptoms persist, may need to try different antiarrhythmic. Ferman Hamming, MD, [facility]  August 13, 2010 6:26 PM

## 2010-12-15 NOTE — Assessment & Plan Note (Signed)
Summary: 8WK F/U PT WANTED TO WAIT UNTIL 10 WEEKS/SL   Visit Type:  Follow-up Referring Provider:  Cammie Mcgee,  CC:  atrial fibrillation.  History of Present Illness: The patient is seen for followup of atrial fibrillation.  He is continuing to take Multaq.  Diarrhea is gone.  He is feeling stronger.  He still has a sensation of feeling weakness after exercising.  He has read the literature extensively and this sensation is described and listed as asthenia.  For now we will assume that this is the issue.  We have decided to cut his morning dose from 400 to 200 and he'll take 400 later in the day. we will stay with this drug as his antiarrhythmic for now.  Current Medications (verified): 1)  Lipitor 10 Mg Tabs (Atorvastatin Calcium) .... Take One Tablet By Mouth Daily. 2)  Pradaxa 150 Mg Caps (Dabigatran Etexilate Mesylate) .... Take 1 Tablet By Mouth Two Times A Day 3)  Prilosec 20 Mg Cpdr (Omeprazole) .... Take 1 Tablet By Mouth M, W, F 4)  Metoprolol Tartrate 25 Mg Tabs (Metoprolol Tartrate) .... 1/2 Tab Every Pm Only 5)  Aspirin 81 Mg Tbec (Aspirin) .Marland Kitchen.. 1 Tab Mon, Wed, Fri 6)  Multaq 400 Mg Tabs (Dronedarone Hcl) .... Take 1 Tablet By Mouth Two Times A Day  Allergies (verified): 1)  ! Penicillin 2)  ! * Guafinison  Past History:  Past Medical History: CAD   minimal...cath....2005  /   cath 06/2010..30% lesions in three vessels...normal LV EF   60%...echo.Marland KitchenMarland Kitchen4/2009  /  normal  TEE and cath...06/2010  /  60%...echo.Marland Kitchen.05/2010 Hyperlipidemia Atrial fibrillation  05/2010.Marland KitchenMarland KitchenMarland KitchenHospital..Medications reviewed with patient for rate.Marland KitchenMarland KitchenPradaxa started...TEE cardioversion on full dose Pradaxa...home later that day  /   return of atrial fibrillation July 30, 2010  /  Multaq started August 10, 2010 with plans for outpatient cardioversion August 17, 2010 /  EKG on October 3,2011 normal sinus rhythm.... Lopressor decreased to 12.5 mg in the evening only /  September 10, 2010 Multaq reduced to  200 mg a.m. / 400 mg p.o. Pradaxa Rx Monoclonal Gamopathy     [place] TIA    question in past....Dr. Sandria Manly  (off ASA for 10 days at that time) Aortic stenosis   mild....echo.Marland KitchenMarland Kitchen4/2009  /  minimal by TEE...06/2010 /  mild...echo,,,05/2010 GERD   esophageal dilitation   1992...some esophageal spasm Transurethral prostate ablation 2004 AI   mild...echo.Marland KitchenMarland Kitchen4/2009  /   mild  by TEE  06/2010  /  mild...echo.Marland Kitchen.05/2010 MR    mild....echo....02/2008  /   mild..TEE...06/2010  /  mild...echo... 05/2010 Cervical spine disease Carotid doppler 2008...Marland Kitchenno significant abnormality Renal insufficiency    prior creatinine 1.2.Marland KitchenMarland Kitchen creatinine checked August 14, 2010 1.7.Marland KitchenMarland KitchenMarland Kitchen creatinine August 17, 2010  1.8  Review of Systems       Patient denies fever, chills, headache, sweats, rest, change in vision, change in hearing, chest pain, cough, nausea vomiting, urinary symptoms.  All other systems are reviewed and are negative.  Vital Signs:  Patient profile:   77 year old male Height:      69 inches Weight:      199 pounds BMI:     29.49 Pulse rate:   85 / minute BP sitting:   134 / 76  (right arm) Cuff size:   regular  Vitals Entered By: Hardin Negus, RMA (September 10, 2010 2:38 PM)  Physical Exam  General:  patient is stable. Eyes:  no xanthelasma. Neck:  no jugular venous distention. Lungs:  lungs are clear.  Respiratory effort is nonlabored. Heart:  cardiac exam reveals an S1-S2.  No clicks or significant murmurs. Abdomen:  abdomen is soft. Extremities:  no peripheral edema. Psych:  patient is oriented to person time and place.  Affect is normal.   Impression & Recommendations:  Problem # 1:  * PRADAXA RX This is being continued.  Problem # 2:  AORTIC STENOSIS (ICD-424.1)  His updated medication list for this problem includes:    Metoprolol Tartrate 25 Mg Tabs (Metoprolol tartrate) .Marland Kitchen... 1/2 tab every pm only This is stable.  No change in therapy.  Problem # 3:  ATRIAL FIBRILLATION,  PAROXYSMAL (ICD-427.31)  His updated medication list for this problem includes:    Metoprolol Tartrate 25 Mg Tabs (Metoprolol tartrate) .Marland Kitchen... 1/2 tab every pm only    Aspirin 81 Mg Tbec (Aspirin) .Marland Kitchen... 1 tab mon, wed, fri    Multaq 400 Mg Tabs (Dronedarone hcl) .Marland Kitchen... Take 1 tablet by mouth two times a day The patient's rhythm is regular today.  I have not done an EKG.  He is holding sinus rhythm. If we cannot continue Multaq, we have other drug options.  Problem # 4:  * MULTAQ RX The patient reported that his review of the literature shows that this drug can cause some mild increase in serum creatinine.  It also can cause some weakness with exercise.  This may be what he is suffering from.  We will try to reduce the dose slightly and see how he feels.  Patient Instructions: 1)  Your physician recommends that you schedule a follow-up appointment in: 4 to 5 weeks. 2)  Your physician recommends that you continue on your current medications as directed. Please refer to the Current Medication list given to you today.

## 2010-12-15 NOTE — Letter (Signed)
Summary: Custom - Lipid  [facility], Main Office  [address]   [place], [place] [address]   Phone: [phone]  Fax: [phone]     May 06, 2010 MRN: [id number]   ARVON SCHREINER [address] [place], [place]  [address]   Dear Mr. Kilty,  We have reviewed your cholesterol results.  They are as follows:     Total Cholesterol:    155 (Desirable: less than 200)       HDL  Cholesterol:     56  (Desirable: greater than 40 for men and 50 for women)       LDL Cholesterol:       89  (Desirable: less than 100 for low risk and less than 70 for moderate to high risk)       Triglycerides:         (Desirable: less than 150)  Our recommendations include:  Looks Good, continue current medications   Call our office at the number listed above if you have any questions.  Lowering your LDL cholesterol is important, but it is only one of a large number of "risk factors" that may indicate that you are at risk for heart disease, stroke or other complications of hardening of the arteries.  Other risk factors include:   A.  Cigarette Smoking* B.  High Blood Pressure* C.  Obesity* D.   Low HDL Cholesterol (see yours above)* E.   Diabetes Mellitus (higher risk if your is uncontrolled) F.  Family history of premature heart disease G.  Previous history of stroke or cardiovascular disease    *These are risk factors YOU HAVE CONTROL OVER.  For more information, visit .  There is now evidence that lowering the TOTAL CHOLESTEROL AND LDL CHOLESTEROL can reduce the risk of heart disease.  The American Heart Association recommends the following guidelines for the treatment of elevated cholesterol:  1.  If there is now current heart disease and less than two risk factors, TOTAL CHOLESTEROL should be less than 200 and LDL CHOLESTEROL should be less than 100. 2.  If there is current heart disease or two or more risk factors, TOTAL CHOLESTEROL should be less than 200 and LDL  CHOLESTEROL should be less than 70.  A diet low in cholesterol, saturated fat, and calories is the cornerstone of treatment for elevated cholesterol.  Cessation of smoking and exercise are also important in the management of elevated cholesterol and preventing vascular disease.  Studies have shown that 30 to 60 minutes of physical activity most days can help lower blood pressure, lower cholesterol, and keep your weight at a healthy level.  Drug therapy is used when cholesterol levels do not respond to therapeutic lifestyle changes (smoking cessation, diet, and exercise) and remains unacceptably high.  If medication is started, it is important to have you levels checked periodically to evaluate the need for further treatment options.  Thank you,    [employer] Team

## 2010-12-15 NOTE — Progress Notes (Signed)
Summary: tylox  Phone Note Other Incoming   Caller: Dr Myrtis Ser Summary of Call: Dr Myrtis Ser called today and states that he has spoken w/pt over the weekend, pt feeling ok but is having some pain, per Dr Myrtis Ser get pt a prescription for Tylox  Follow-up for Phone Call        Dr Excell Seltzer signed prescription pt is aware and will come by to pick up later Meredith Staggers, RN  August 03, 2010 12:23 PM     New/Updated Medications: TYLOX 5-500 MG CAPS (OXYCODONE-ACETAMINOPHEN) 1 tab every 6 hours as needed for pain Prescriptions: TYLOX 5-500 MG CAPS (OXYCODONE-ACETAMINOPHEN) 1 tab every 6 hours as needed for pain  #15 x 0   Entered by:   Meredith Staggers, RN   Authorized by:   Norva Karvonen, MD   Signed by:   Meredith Staggers, RN on 08/03/2010   Method used:   Print then Give to Patient   RxID:   [id number]

## 2010-12-15 NOTE — Assessment & Plan Note (Signed)
Summary: needs ekg prior to dccv, call heather s. Marvelous.Norman  Nurse Visit   Vital Signs:  Patient profile:   77 year old male Pulse rate:   51 / minute  Vitals Entered By: Meredith Staggers, RN (August 17, 2010 11:31 AM)  Impression & Recommendations: Dr Myrtis Ser examined pt, ordered labs and made med changes, see his OV note dated 10/5 Meredith Staggers, RN  August 19, 2010 10:00 AM   Other Orders: EKG w/ Interpretation (93000) TLB-BMP (Basic Metabolic Panel-BMET) (80048-METABOL) TLB-CK Total Only(Creatine Kinase/CPK) (82550-CK)  Comments pt in for EKG which showed sinus brady at 51, dccv sch for today cx'd, Dr Myrtis Ser aware and came and saw pt to discuss med changes Meredith Staggers, RN  August 17, 2010 11:40 AM    Current Medications (verified): 1)  Lipitor 10 Mg Tabs (Atorvastatin Calcium) .... Take One Tablet By Mouth Daily. 2)  Pradaxa 150 Mg Caps (Dabigatran Etexilate Mesylate) .... Take 1 Tablet By Mouth Two Times A Day 3)  Prilosec 20 Mg Cpdr (Omeprazole) .... Take 1 Tablet By Mouth Once A Day 4)  Metoprolol Tartrate 25 Mg Tabs (Metoprolol Tartrate) .... 1/2 Tab Two Times A Day 5)  Aspirin 81 Mg Tbec (Aspirin) .Marland Kitchen.. 1 Tab Mon, Wed, Fri 6)  Tylox 5-500 Mg Caps (Oxycodone-Acetaminophen) .Marland Kitchen.. 1 Tab Every 6 Hours As Needed For Pain 7)  Multaq 400 Mg Tabs (Dronedarone Hcl) .... Take 1 Tablet By Mouth Two Times A Day  Allergies (verified): 1)  ! Penicillin  Orders Added: 1)  EKG w/ Interpretation [93000] 2)  TLB-BMP (Basic Metabolic Panel-BMET) [80048-METABOL] 3)  TLB-CK Total Only(Creatine Kinase/CPK) [82550-CK]   Patient Instructions: 1)  Labs today 2)  Decrease Metoprolol to 12.5mg  in PM only 3)  If not feeling better in a few days can decrease Multaq to 100mg  in AM and 400mg  in PM

## 2010-12-15 NOTE — Miscellaneous (Signed)
  Clinical Lists Changes  Problems: Added new problem of ATRIAL FIBRILLATION, PAROXYSMAL (ICD-427.31) Added new problem of * MONOCLONAL GAMOPATHY Added new problem of * TIA Added new problem of AORTIC STENOSIS (ICD-424.1) Added new problem of AORTIC REGURGITATION (ICD-424.1) Added new problem of MITRAL REGURGITATION (ICD-396.3) Added new problem of GERD (ICD-530.81) Observations: Added new observation of PAST MED HX: CAD   minimal...cath....2005  /   cath 06/2010..30% lesions in three vessels...normal LV EF   60%...echo.Marland KitchenMarland Kitchen4/2009  /  normal  TEE and cath...06/2010  /  60%...echo.Marland Kitchen.05/2010 Hyperlipidemia Atrial fibrillation  05/2010.Marland KitchenMarland KitchenMarland KitchenHospital..Medications reviewed with patient for rate.Marland KitchenMarland KitchenPradaxa started...TEE cardioversion on full dose Pradaxa...home later that day Monoclonal Gamopathy     [place] TIA    question in past....Dr. Sandria Manly  (off ASA for 10 days at that time) Aortic stenosis   mild....echo.Marland KitchenMarland Kitchen4/2009  /  minimal by TEE...06/2010 /  mild...echo,,,05/2010 GERD   esophageal dilitation   1992...some esophageal spasm Transurethral prostate ablation 2004 AI   mild...echo.Marland KitchenMarland Kitchen4/2009  /   mild  by TEE  06/2010  /  mild...echo.Marland Kitchen.05/2010 MR    mild....echo....02/2008  /   mild..TEE...06/2010  /  mild...echo... 05/2010 Cervical spine disease Carotid doppler 2008...Marland Kitchenno significant abnormality (07/14/2010 19:20) Added new observation of REFERRING MD: Cammie Mcgee, (07/14/2010 19:20)       Past History:  Past Medical History: CAD   minimal...cath....2005  /   cath 06/2010..30% lesions in three vessels...normal LV EF   60%...echo.Marland KitchenMarland Kitchen4/2009  /  normal  TEE and cath...06/2010  /  60%...echo.Marland Kitchen.05/2010 Hyperlipidemia Atrial fibrillation  05/2010.Marland KitchenMarland KitchenMarland KitchenHospital..Medications reviewed with patient for rate.Marland KitchenMarland KitchenPradaxa started...TEE cardioversion on full dose Pradaxa...home later that day Monoclonal Gamopathy     [place] TIA    question in past....Dr. Sandria Manly  (off ASA for 10 days at that time) Aortic  stenosis   mild....echo.Marland KitchenMarland Kitchen4/2009  /  minimal by TEE...06/2010 /  mild...echo,,,05/2010 GERD   esophageal dilitation   1992...some esophageal spasm Transurethral prostate ablation 2004 AI   mild...echo.Marland KitchenMarland Kitchen4/2009  /   mild  by TEE  06/2010  /  mild...echo.Marland Kitchen.05/2010 MR    mild....echo....02/2008  /   mild..TEE...06/2010  /  mild...echo... 05/2010 Cervical spine disease Carotid doppler 2008...Marland Kitchenno significant abnormality

## 2010-12-15 NOTE — Assessment & Plan Note (Signed)
Summary: [name] [name]   Visit Type:  Add on Referring Provider:  Cammie Mcgee,   History of Present Illness: The patient was not seen today.  This is a summary note.  On August 17, 2010, the patient came to the office for an EKG before the final plans for cardioversion.  He had been started on Multag in the prior week.  He had some diarrhea.  We encouraged him to remain on the drug and his diarrhea improved.  He did convert to sinus rhythm.  While here for his EKG he mentions an unusual sensation that he has when standing up.  He feels as if blood is rushing to his head.  He does not have syncope or presyncope. he also mentioned some fatigue in his legs.  I examined him briefly and there were no abnormal findings.  We decided that he would continue Multaq. chemistry was to be checked as we had noted during the prior week that his creatinine was up to 1.7.  It had been 1.2 earlier in the year.  CPK was also to be checked to rule out myositis. The CPK came back in the normal range.  Metoprolol was decreased to 12.5 mg in the evening only for a few days.  If he still felt the sensation he would decrease the Multaq to 200 mg in the morning and 400 mg in the evening.  We will then see him in followup.  His creatinine was up to 1.8.  Today I called Dr.  Cyndie Chime.  To update him about all of the issues.  He will followup his renal function.  Allergies: 1)  ! Penicillin  Past History:  Past Medical History: CAD   minimal...cath....2005  /   cath 06/2010..30% lesions in three vessels...normal LV EF   60%...echo.Marland KitchenMarland Kitchen4/2009  /  normal  TEE and cath...06/2010  /  60%...echo.Marland Kitchen.05/2010 Hyperlipidemia Atrial fibrillation  05/2010.Marland KitchenMarland KitchenMarland KitchenHospital..Medications reviewed with patient for rate.Marland KitchenMarland KitchenPradaxa started...TEE cardioversion on full dose Pradaxa...home later that day  /   return of atrial fibrillation July 30, 2010  /  Multaq started August 10, 2010 with plans for outpatient cardioversion August 17, 2010  /  EKG on October 3,2011 normal sinus rhythm.... Lopressor decreased to 12.5 mg in the evening only Pradaxa Rx Monoclonal Gamopathy     [place] TIA    question in past....Dr. Sandria Manly  (off ASA for 10 days at that time) Aortic stenosis   mild....echo.Marland KitchenMarland Kitchen4/2009  /  minimal by TEE...06/2010 /  mild...echo,,,05/2010 GERD   esophageal dilitation   1992...some esophageal spasm Transurethral prostate ablation 2004 AI   mild...echo.Marland KitchenMarland Kitchen4/2009  /   mild  by TEE  06/2010  /  mild...echo.Marland Kitchen.05/2010 MR    mild....echo....02/2008  /   mild..TEE...06/2010  /  mild...echo... 05/2010 Cervical spine disease Carotid doppler 2008...Marland Kitchenno significant abnormality Renal insufficiency    prior creatinine 1.2.Marland KitchenMarland Kitchen creatinine checked August 14, 2010 1.7.Marland KitchenMarland KitchenMarland Kitchen creatinine August 17, 2010  1.8

## 2010-12-15 NOTE — Assessment & Plan Note (Signed)
Summary: f2w   Visit Type:  Follow-up Referring Provider:  Cammie Mcgee,  CC:  atrial fibrillation.  History of Present Illness: The patient returns today for followup of atrial fibrillation.  I saw him last in the office on July 31, 2010.  At that time we decided to see how he would feel with adjustment in meds for rate control.  He was hesitant to take Cardizem because he had a pause in the hospital.  He wanted only to increase his beta blocker dose.  This was done.  He has symptoms of chest discomfort.  We know he has only mild coronary disease.  I believe that his chest discomfort is related to rapid atrial fibrillation.  He and I both agree that we need to convert him to sinus.  Previously I had reviewed with Dr.Taylor what our algorithm would be.  The patient cannot use flecainide or Rythmol because of mild coronary disease.  The plan will be to try Multaq.  If we cannot control him with this he will have either amiodarone as an outpatient or Tikosyn with an inpatient load.  Patient is in agreement.    Current Medications (verified): 1)  Lipitor 10 Mg Tabs (Atorvastatin Calcium) .... Take One Tablet By Mouth Daily. 2)  Pradaxa 150 Mg Caps (Dabigatran Etexilate Mesylate) .... Take 1 Tablet By Mouth Two Times A Day 3)  Prilosec 20 Mg Cpdr (Omeprazole) .... Take 1 Tablet By Mouth Once A Day 4)  Metoprolol Tartrate 25 Mg Tabs (Metoprolol Tartrate) .... Take 1 Tablet By Mouth in The Am and 2 in The Pm 5)  Aspirin 81 Mg Tbec (Aspirin) .Marland Kitchen.. 1 Tab Mon, Wed, Fri 6)  Tylox 5-500 Mg Caps (Oxycodone-Acetaminophen) .Marland Kitchen.. 1 Tab Every 6 Hours As Needed For Pain  Allergies (verified): 1)  ! Penicillin  Past History:  Past Medical History: CAD   minimal...cath....2005  /   cath 06/2010..30% lesions in three vessels...normal LV EF   60%...echo.Marland KitchenMarland Kitchen4/2009  /  normal  TEE and cath...06/2010  /  60%...echo.Marland Kitchen.05/2010 Hyperlipidemia Atrial fibrillation  05/2010.Mar[facility]Mar[facility]Hospital..Medications reviewed with  patient for rate.Marland KitchenMarland KitchenPradaxa started...TEE cardioversion on full dose Pradaxa...home later that day  /   return of atrial fibrillation July 30, 2010  /  Multaq started August 10, 2010 with plans for outpatient cardioversion August 17, 2010 Pradaxa Rx Monoclonal Gamopathy     [place] TIA    question in past....Dr. Sandria Manly  (off ASA for 10 days at that time) Aortic stenosis   mild....echo.Marland KitchenMarland Kitchen4/2009  /  minimal by TEE...06/2010 /  mild...echo,,,05/2010 GERD   esophageal dilitation   1992...some esophageal spasm Transurethral prostate ablation 2004 AI   mild...echo.Marland KitchenMarland Kitchen4/2009  /   mild  by TEE  06/2010  /  mild...echo.Marland Kitchen.05/2010 MR    mild....echo....02/2008  /   mild..TEE...06/2010  /  mild...echo... 05/2010 Cervical spine disease Carotid doppler 2008...Marland Kitchenno significant abnormality  Review of Systems       The patient denies fever, chills, headache, rash, change in vision, change in hearing, chest pain, cough, nausea vomiting, urinary symptoms.  All of the systems are reviewed and are negative.  Vital Signs:  Patient profile:   77 year old male Height:      69 inches Weight:      198 pounds BMI:     29.35 Pulse rate:   65 / minute BP sitting:   112 / 72  (left arm) Cuff size:   regular  Vitals Entered By: Hardin Negus, RMA (August 10, 2010 3:05 PM)  Physical Exam  General:  patient is stable today. Head:  head is atraumatic. Eyes:  no xanthelasma. Neck:  no jugular distention. Chest Wall:  no chest wall tenderness. Lungs:  lungs are clear.  Respiratory effort is not labored. Heart:  cardiac exam reveals S1 and S2.  Rhythm is irregularly irregular. There is a systolic murmur. Abdomen:  abdomen soft. Msk:  no musculoskeletal deformities. Extremities:  no peripheral edema. Skin:  no skin rashes. Psych:  patient is oriented to person time and place.  Affect is normal.   Impression & Recommendations:  Problem # 1:  * PRADAXA RX Patient continues on her backside and he is  tolerating it well.  Problem # 2:  GERD (ICD-530.81)  His updated medication list for this problem includes:    Prilosec 20 Mg Cpdr (Omeprazole) .Marland Kitchen... Take 1 tablet by mouth once a day The patient is taking his Prilosec.  The symptom that he is having in his chest does not appear to be reflux.  Problem # 3:  MITRAL REGURGITATION (ICD-396.3) Mitral regurgitation is mild.  Problem # 4:  AORTIC REGURGITATION (ICD-424.1)  The following medications were removed from the medication list:    Atenolol 25 Mg Tabs (Atenolol) .Marland Kitchen... 1 tab by mouth two times a day His updated medication list for this problem includes:    Metoprolol Tartrate 25 Mg Tabs (Metoprolol tartrate) .Marland Kitchen... Take 1 tablet by mouth in the am and 2 in the pm Aortic regurgitation is mild.  Problem # 5:  AORTIC STENOSIS (ICD-424.1)  The following medications were removed from the medication list:    Atenolol 25 Mg Tabs (Atenolol) .Marland Kitchen... 1 tab by mouth two times a day His updated medication list for this problem includes:    Metoprolol Tartrate 25 Mg Tabs (Metoprolol tartrate) .Marland Kitchen... Take 1 tablet by mouth in the am and 2 in the pm Aortic stenosis is mild.  Problem # 6:  ATRIAL FIBRILLATION, PAROXYSMAL (ICD-427.31)  The following medications were removed from the medication list:    Atenolol 25 Mg Tabs (Atenolol) .Marland Kitchen... 1 tab by mouth two times a day His updated medication list for this problem includes:    Metoprolol Tartrate 25 Mg Tabs (Metoprolol tartrate) .Marland Kitchen... Take 1 tablet by mouth in the am and 2 in the pm    Aspirin 81 Mg Tbec (Aspirin) .Marland Kitchen... 1 tab mon, wed, fri    Multaq 400 Mg Tabs (Dronedarone hcl) .Marland Kitchen... Take 1 tablet by mouth two times a day The patient has symptomatic atrial fibrillation.  I will try to increase his beta blocker for better rate control over the next few days. Multaq 400 mg p.o. b.i.d. is started today.  The patient will have an EKG in the morning of October 3.  If he remains in atrial fibrillation we  will proceed with outpatient cardioversion later that day.  Orders: TLB-BMP (Basic Metabolic Panel-BMET) (80048-METABOL) TLB-CBC Platelet - w/Differential (85025-CBCD) TLB-PT (Protime) (85610-PTP) Cardioversion (Cardioversion)  Problem # 7:  CAD, NATIVE VESSEL (ICD-414.01)  The following medications were removed from the medication list:    Atenolol 25 Mg Tabs (Atenolol) .Marland Kitchen... 1 tab by mouth two times a day His updated medication list for this problem includes:    Metoprolol Tartrate 25 Mg Tabs (Metoprolol tartrate) .Marland Kitchen... Take 1 tablet by mouth in the am and 2 in the pm    Aspirin 81 Mg Tbec (Aspirin) .Marland Kitchen... 1 tab mon, wed, fri We know from the patient's angiography that he has mild coronary disease.  I do believe that some of his chest discomfort is probably cardiac in origin from rapid atrial fib.  This is why we will push to get him back to sinus.  Patient Instructions: 1)  Start Multaq 400mg  two times a day  2)  Labs today 3)  Your physician has recommended that you have a cardioversion (DCCV).  Electrical cardioversion uses a jolt of electricity to your heart either through paddles or wired patches attached to your chest. This is a controlled, usually prescheduled, procedure. Defibrillation is done under light anesthesia in the hospital, and you usually go home the day of the procedure. This is done to get your heart back into a normal rhythm. You are not awake for the procedure. Please see the instruction sheet given to you today. 4)  Come by our office 10/3 at around 11:30 to have an EKG before going to the hospital for your cardioversion. Prescriptions: MULTAQ 400 MG TABS (DRONEDARONE HCL) Take 1 tablet by mouth two times a day  #60 x 6   Entered by:   Meredith Staggers, RN   Authorized by:   Talitha Givens, MD, [facility]   Signed by:   Meredith Staggers, RN on 08/10/2010   Method used:   Electronically to        [phone] (retail)       [address]        [place], [place]  [address]       Ph: [phone] or [phone]       Fax: [phone]   RxID:   [id number] TYLOX 5-500 MG CAPS (OXYCODONE-ACETAMINOPHEN) 1 tab every 6 hours as needed for pain  #30 x 0   Entered by:   Meredith Staggers, RN   Authorized by:   Talitha Givens, MD, [facility]   Signed by:   Meredith Staggers, RN on 08/10/2010   Method used:   Print then Give to Patient   RxID:   [id number]

## 2010-12-15 NOTE — Assessment & Plan Note (Signed)
Summary: PER CHECK OUT/SF   Visit Type:  Follow-up Referring Provider:  Cammie Mcgee,  CC:  atrial fibrillation.  History of Present Illness: The patient is seen back for followup of atrial fibrillation.  We did make an adjustment to his Multaq.  He takes 200 mg in the morning and 400 mg later in the day.  He is tolerating this well.  He has sinus rhythm.  He is feeling stronger and stronger and back to playing golf.  He normally has 2 bowel movements each morning.  He now has a third with some diarrhea.  He is tolerating this and we will not make any changes.  I had a careful discussion with him about the ongoing use of his anticoagulants.   Current Medications (verified): 1)  Lipitor 10 Mg Tabs (Atorvastatin Calcium) .... Take One Tablet By Mouth Daily. 2)  Pradaxa 150 Mg Caps (Dabigatran Etexilate Mesylate) .... Take 1 Tablet By Mouth Two Times A Day 3)  Prilosec 20 Mg Cpdr (Omeprazole) .... Take 1 Tablet By Mouth M, W, F 4)  Metoprolol Tartrate 25 Mg Tabs (Metoprolol Tartrate) .... 1/2 Tab Every Pm Only 5)  Aspirin 81 Mg Tbec (Aspirin) .Marland Kitchen.. 1 Tab Mon, Wed, Fri 6)  Multaq 400 Mg Tabs (Dronedarone Hcl) .... Take 1/2 Tablet in Am and 1 Tablet in Evening 7)  Naproxen Sodium 220 Mg Tabs (Naproxen Sodium) .... Take One As Needed  Allergies (verified): 1)  ! Penicillin 2)  ! * Guafinison  Past History:  Past Medical History: CAD   minimal...cath....2005  /   cath 06/2010..30% lesions in three vessels...normal LV EF   60%...echo.Marland KitchenMarland Kitchen4/2009  /  normal  TEE and cath...06/2010  /  60%...echo.Marland Kitchen.05/2010 Hyperlipidemia Atrial fibrillation  05/2010.Marland KitchenMarland KitchenMarland KitchenHospital..Medications reviewed with patient for rate.Marland KitchenMarland KitchenPradaxa started...TEE cardioversion on full dose Pradaxa...home later that day  /   return of atrial fibrillation July 30, 2010  /  Multaq started August 10, 2010 with plans for outpatient cardioversion August 17, 2010 /  EKG on October 3,2011 normal sinus rhythm.... Lopressor decreased  to 12.5 mg in the evening only /  September 10, 2010 Multaq reduced to 200 mg a.m. / 400 mg p.o.  //  tolerating this dose very well in November, 2011 Pradaxa Rx Monoclonal Gamopathy     [place] TIA    question in past....Dr. Sandria Manly  (off ASA for 10 days at that time) Aortic stenosis   mild....echo.Marland KitchenMarland Kitchen4/2009  /  minimal by TEE...06/2010 /  mild...echo,,,05/2010 GERD   esophageal dilitation   1992...some esophageal spasm Transurethral prostate ablation 2004 AI   mild...echo.Marland KitchenMarland Kitchen4/2009  /   mild  by TEE  06/2010  /  mild...echo.Marland Kitchen.05/2010 MR    mild....echo....02/2008  /   mild..TEE...06/2010  /  mild...echo... 05/2010 Cervical spine disease Carotid doppler 2008...Marland Kitchenno significant abnormality Renal insufficiency    prior creatinine 1.2.Marland KitchenMarland Kitchen creatinine checked August 14, 2010 1.7.Marland KitchenMarland KitchenMarland Kitchen creatinine August 17, 2010  1.8  Review of Systems       Patient denies fever, chills, headache, sweats, rash, change in vision, change in hearing, chest pain, cough, nausea vomiting,urinary symptoms.  All other systems are reviewed and are negative.  Vital Signs:  Patient profile:   77 year old male Height:      69 inches Weight:      196 pounds BMI:     29.05 Pulse rate:   60 / minute Pulse rhythm:   regular BP sitting:   130 / 84  (left arm)  Vitals Entered By: Jacquelin Hawking,  CMA (October 14, 2010 10:09 AM)  Physical Exam  General:  patient is feeling very well and looks good. Eyes:  no xanthelasma. Neck:  no jugular venous distention. Lungs:  lungs are clear.  Respiratory effort is not labored. Heart:  cardiac exam reveals S1 and S2.  There is a systolic murmur. Abdomen:  abdomen is soft. Extremities:  no peripheral edema. Psych:  patient is oriented to person time and place.  Affect is normal.   Impression & Recommendations:  Problem # 1:  * MULTAQ RX This is now working well.  No change in therapy. I. see him next we will decide about any followup labs for this drug.  Problem # 2:  * PRADAXA  RX The patient has reviewed an extensive document on atrial fibrillation.  He now understands all the arguments and agrees fully that he should remain on this therapy.  At the last visit he asked me if he could stop.  We'll rereview his risk factors.  They include question of hypertension, age greater than 8, and TIA.  Problem # 3:  AORTIC STENOSIS (ICD-424.1)  His updated medication list for this problem includes:    Metoprolol Tartrate 25 Mg Tabs (Metoprolol tartrate) .Marland Kitchen... 1/2 tab every pm only This is mild and will be followed.  Problem # 4:  ATRIAL FIBRILLATION, PAROXYSMAL (ICD-427.31)  His updated medication list for this problem includes:    Metoprolol Tartrate 25 Mg Tabs (Metoprolol tartrate) .Marland Kitchen... 1/2 tab every pm only    Aspirin 81 Mg Tbec (Aspirin) .Marland Kitchen... 1 tab mon, wed, fri    Multaq 400 Mg Tabs (Dronedarone hcl) .Marland Kitchen... Take 1/2 tablet in am and 1 tablet in evening EKG is done today and reviewed by me.  He is holding sinus rhythm.  No change in therapy.  Problem # 5:  DIARRHEA, CHRONIC (ICD-787.91) The patient chronically has 2 bowel movements each morning.  The second one is loose.  He now has a third movement that is slightly watery.  This may be related to the Multaq.  He is stable with this and no changes will be made.  Patient Instructions: 1)  Your physician recommends that you schedule a follow-up appointment in: 3 months 2)  Your physician recommends that you continue on your current medications as directed. Please refer to the Current Medication list given to you today.

## 2010-12-15 NOTE — Letter (Signed)
Summary: Cardioversion/TEE Instructions  Architectural technologist, Main Office  [address]   [place], [place] [address]   Phone: [phone]  Fax: [phone]    Cardioversion / TEE Cardioversion Instructions  08/10/2010 MRN: [id number]  [name] [name] [address] [place], [place]  [address]  Dear Mr. Clingenpeel, You are scheduled for a Cardioversion on Monday 08/17/10 with Dr Myrtis Ser   Please arrive at the [facility] at 12:00 p.m. on the day of your procedure.  1)   DIET:  A)   Nothing to eat or drink after midnight except your medications with a sip of water.  B)   May have clear liquid breakfast, then nothing to eat or drink after 8 a.m. / p.m.      Clear liquids include:  water, broth, Sprite, Ginger Ale, black coffee, tea (no sugar),      cranberry / grape / apple juice, jello (not red), popsicle from clear juices (not red).  2)   Come to the [facility] office on ____TODAY________________ for lab work. The lab at [facility] is open from 8:30 a.m. to 1:30 p.m. and 2:30 p.m. to 5:00 p.m. The lab at [facility] is open from 7:30 a.m. to 5:30 p.m. You do not have to be fasting.  3)   MAKE SURE YOU TAKE YOUR PRADAXA.  4)   A)   DO NOT TAKE these medications before your procedure:      ___________________________________________________________________     ___________________________________________________________________     ___________________________________________________________________  B)   YOU MAY TAKE ALL of your remaining medications with a small amount of water.    C)   START NEW medications:       ___________________________________________________________________     ___________________________________________________________________  5)  Must have a responsible person to drive you home.  6)   Bring a current list of your medications and current insurance cards.   * Special Note:  Every effort is  made to have your procedure done on time. Occasionally there are emergencies that present themselves at the hospital that may cause delays. Please be patient if a delay does occur.  * If you have any questions after you get home, please call the office at [phone].

## 2010-12-15 NOTE — Letter (Signed)
[facility]   Imported By: Marylou Mccoy 06/03/2010 18:37:00  _____________________________________________________________________  External Attachment:    Type:   Image     Comment:   External Document

## 2010-12-15 NOTE — Assessment & Plan Note (Signed)
Summary: rov   Visit Type:  add-on Referring [name] [name]:  [name] [name],  CC:  atrial fibrillation.  History of Present Illness: The patient called yesterday to say that he had gone back into atrial fibrillation.  He also did have some tightness in his neck.  He had had this sensation when he had atrial fib and went to the hospital.  We know that he has only mild coronary artery disease.  He felt safe taking extra beta blockers and coming to see me this morning.  Here in the office he looks stable.  He is in atrial fibrillation.  The rate is approximately 85-90.  Current Medications (verified): 1)  Lipitor 10 Mg Tabs (Atorvastatin Calcium) .... Take One Tablet By Mouth Daily. 2)  Atenolol 25 Mg Tabs (Atenolol) .Marland Kitchen.. 1 Tab By Mouth Two Times A Day 3)  Pradaxa 150 Mg Caps (Dabigatran Etexilate Mesylate) .... Take 1 Tablet By Mouth Two Times A Day 4)  Prilosec 20 Mg Cpdr (Omeprazole) .Marland Kitchen.. 1 Tab Mon, Wed, Fri 5)  Metoprolol Tartrate 25 Mg Tabs (Metoprolol Tartrate) .... Take One Tablet By Mouth Twice A Day 6)  Aspirin 81 Mg Tbec (Aspirin) .Marland Kitchen.. 1 Tab Mon, Wed, Fri  Allergies: 1)  ! Penicillin  Past History:  Past Medical History: CAD   minimal...cath....2005  /   cath 06/2010..30% lesions in three vessels...normal LV EF   60%...echo.Marland KitchenMarland Kitchen4/2009  /  normal  TEE and cath...06/2010  /  60%...echo.Marland Kitchen.05/2010 Hyperlipidemia Atrial fibrillation  05/2010.Marland KitchenMarland KitchenMarland KitchenHospital..Medications reviewed with patient for rate.Marland KitchenMarland KitchenPradaxa started...TEE cardioversion on full dose Pradaxa...home later that day  /   return of atrial fibrillation July 30, 2010 Pradaxa Rx Monoclonal Gamopathy     [place] TIA    question in past....Dr. Sandria Manly  (off ASA for 10 days at that time) Aortic stenosis   mild....echo.Marland KitchenMarland Kitchen4/2009  /  minimal by TEE...06/2010 /  mild...echo,,,05/2010 GERD   esophageal dilitation   1992...some esophageal spasm Transurethral prostate ablation 2004 AI   mild...echo.Marland KitchenMarland Kitchen4/2009  /   mild  by TEE  06/2010  /   mild...echo.Marland Kitchen.05/2010 MR    mild....echo....02/2008  /   mild..TEE...06/2010  /  mild...echo... 05/2010 Cervical spine disease Carotid doppler 2008...Marland Kitchenno significant abnormality  Review of Systems       Patient denies fever, chills, headache, sweats, rash, change in vision, change in hearing, cough, nausea vomiting, urinary symptoms.  All other systems are reviewed and are negative.  Vital Signs:  Patient profile:   77 year old male Height:      69 inches Weight:      198 pounds BMI:     29.35 Pulse rate:   106 / minute Resp:     12 per minute BP sitting:   116 / 74  (left arm)  Vitals Entered By: Kem Parkinson (July 31, 2010 10:29 AM)  Physical Exam  General:  Is stable today. Head:  head is atraumatic. Eyes:  no xanthelasma. Neck:  no jugular venous distention. Chest Wall:  no chest wall tenderness. Lungs:  lungs are clear.  Respiratory effort is nonlabored. Heart:  cardiac exam reveals his systolic murmur.  The rhythm is irregularly irregular. Abdomen:  abdomen is soft. Msk:  no musculoskeletal deformities. Extremities:  no peripheral edema. Skin:  no skin rashes. Psych:  patient is oriented to person time and place.  Affect is normal.   Impression & Recommendations:  Problem # 1:  * PRADAXA RX The patient continues on this med with no difficulties.  Problem # 2:  MITRAL REGURGITATION (ICD-396.3)  His  mitral and aortic valvular disease is stable.  No further workup.  Problem # 3:  CAD, NATIVE VESSEL (ICD-414.01) Coronary disease is stable.  I have reviewed again his catheter report.  He had scattered 30 and 40% lesions.  He does not have any significant obstructive disease.  Discomfort that he has in his throat is not fully explained.  I not convinced that it represents angina.  I believe it is a sensation that he has related to his atrial fib.  No further workup at this time.  Problem # 4:  ATRIAL FIBRILLATION, PAROXYSMAL (ICD-427.31)  His updated  medication list for this problem includes:    Atenolol 25 Mg Tabs (Atenolol) .Marland Kitchen... 1 tab by mouth two times a day    Metoprolol Tartrate 25 Mg Tabs (Metoprolol tartrate) .Marland Kitchen... Take one tablet by mouth twice a day    Aspirin 81 Mg Tbec (Aspirin) .Marland Kitchen... 1 tab mon, wed, fri The patient now has return of atrial fibrillation.  The rate is slightly increased.  He had been cardioverted in August , 2011.  He does not have marked symptoms at this time.  Therefore it does not seem prudent to proceed with another cardioversion at this time.  He is anticoagulated.  His left atrium is 51 mm.  The plan will be rate control to start with.  He tells me that he did have one 5 second pause while on IV Cardizem in the hospital.  Therefore he is somewhat hesitant about Cardizem.  He is on beta blockers.  We have decided to push the dose upward slightly.  I would like to consider adding digoxin, but with his mentioning the pause in the hospital we will move very slowly with the addition of other medicines.  The next  step will be to communicate with him about how he is feeling and then see him back in the office.  If we decide to proceed with antiarrhythmics Tambocor and Rythmol cannot be used.  I will strongly consider trying Multaque as the first drug.  After that the choice would be amiodarone with no need for hospitalization or Tikosyn with hospitalization.  As a first step he wants to take 25 mg a beta blocker in the morning and 50 later in the day.  He had noted before to 50 mg in the morning caused him to feel sluggish.  I have discussed this overall approach with Dr. Ladona Ridgel by telephone.

## 2010-12-17 NOTE — Assessment & Plan Note (Signed)
Summary: [facility]   Referring Provider:  Cammie Mcgee,   History of Present Illness: I called and spoke with the patient today.  After reviewing all medications and reviewing his renal status I have decided he decrease his Pradaxa from 150 mg b.i.d. to 75 mg b.i.d.  He was in agreement.    Allergies: 1)  ! Penicillin 2)  ! * Guafinison

## 2010-12-17 NOTE — Progress Notes (Signed)
Summary: changing Pradaxa dose  Phone Note Other Incoming   Summary of Call: pt on Multaq and Pradaxa 150mg  two times a day, Dr Myrtis Ser discussed w/Sally and they agreed to decrease Pradaxa to 75mg  two times a day, Dr Myrtis Ser discussed w/pt new rx sent in  Initial call taken by: Meredith Staggers, RN,  December 02, 2010 3:06 PM    New/Updated Medications: PRADAXA 75 MG CAPS (DABIGATRAN ETEXILATE MESYLATE) Take 1 capsule by mouth two times a day Prescriptions: PRADAXA 75 MG CAPS (DABIGATRAN ETEXILATE MESYLATE) Take 1 capsule by mouth two times a day  #60 x 6   Entered by:   Meredith Staggers, RN   Authorized by:   Talitha Givens, MD, [facility]   Signed by:   Meredith Staggers, RN on 12/02/2010   Method used:   Electronically to        [facility] [phone]* (retail)       [address]       [place], [place]  [address]       Ph: [phone] or [phone]       Fax: [phone]   RxID:   [id number]

## 2010-12-23 NOTE — Letter (Signed)
Summary: [facility]   Imported By: Marylou Mccoy 12/11/2010 10:04:07  _____________________________________________________________________  External Attachment:    Type:   Image     Comment:   External Document

## 2011-01-06 ENCOUNTER — Encounter: Payer: Self-pay | Admitting: Cardiology

## 2011-01-06 ENCOUNTER — Ambulatory Visit (INDEPENDENT_AMBULATORY_CARE_PROVIDER_SITE_OTHER): Payer: Medicare Other | Admitting: Cardiology

## 2011-01-06 DIAGNOSIS — I4891 Unspecified atrial fibrillation: Secondary | ICD-10-CM

## 2011-01-12 NOTE — Assessment & Plan Note (Signed)
Summary: rov/sl   Visit Type:  Follow-up Referring Provider:  Madie Reno Primary Provider:  Otis Dials  CC:  atrial fibrillation.  History of Present Illness: The patient is seen for followup of atrial fibrillation.  I saw him last October 14, 2010.  He continues to do well.  He is regained all of his strength since he was feeling poorly at the time of his last atrial fibrillation.  He is tolerating all medicines.  On the current dosing he is maintaining sinus rhythm.  He still has some diarrhea in the mornings.  However he stable with this.  Because of interaction with Multaq we had decided to lower his dose of Pradaxa to 75 mg b.i.d.  Is not having palpitations chest pain syncope or presyncope.  Current Medications (verified): 1)  Lipitor 10 Mg Tabs (Atorvastatin Calcium) .... Take One Tablet By Mouth Daily. 2)  Pradaxa 75 Mg Caps (Dabigatran Etexilate Mesylate) .... Take 1 Capsule By Mouth Two Times A Day 3)  Prilosec 20 Mg Cpdr (Omeprazole) .... Take 1 Tablet By Mouth M, W, F 4)  Metoprolol Tartrate 25 Mg Tabs (Metoprolol Tartrate) .... 1/2 Tab Every Pm Only 5)  Aspirin 81 Mg Tbec (Aspirin) .Marland Kitchen.. 1 Tab Mon, Wed, Fri 6)  Multaq 400 Mg Tabs (Dronedarone Hcl) .... Take 1/2 Tablet in Am and 1 Tablet in Evening 7)  Naproxen Sodium 220 Mg Tabs (Naproxen Sodium) .... Take One As Needed  Allergies: 1)  ! Penicillin 2)  ! * Guafinison  Past History:  Past Medical History: CAD   minimal...cath....2005  /   cath 06/2010..30% lesions in three vessels...normal LV EF   60%...echo.Marland KitchenMarland Kitchen4/2009  /  normal  TEE and cath...06/2010  /  60%...echo.Marland Kitchen.05/2010 Hyperlipidemia Atrial fibrillation  05/2010.[facility]..Medications reviewed with patient for rate.Marland KitchenMarland KitchenPradaxa started...TEE cardioversion on full dose Pradaxa...home later that day  /   return of atrial fibrillation July 30, 2010  /  Multaq started August 10, 2010 with plans for outpatient cardioversion  August 17, 2010 /  EKG on October 3,2011 normal sinus rhythm.... Lopressor decreased to 12.5 mg in the evening only /  September 10, 2010 Multaq reduced to 200 mg a.m. / 400 mg p.o.  //  tolerating this dose very well in November, 2011 Pradaxa Rx Monoclonal Gamopathy     [place] TIA    question in past....Dr. Sandria Manly  (off ASA for 10 days at that time) Aortic stenosis   mild....echo.Marland KitchenMarland Kitchen4/2009  /  minimal by TEE...06/2010 /  mild...echo,,,05/2010 GERD   esophageal dilitation   1992...some esophageal spasm.. Transurethral prostate ablation 2004 AI   mild...echo.Marland KitchenMarland Kitchen4/2009  /   mild  by TEE  06/2010  /  mild...echo.Marland Kitchen.05/2010 MR    mild....echo....02/2008  /   mild..TEE...06/2010  /  mild...echo... 05/2010 Cervical spine disease Carotid doppler 2008...Marland Kitchenno significant abnormality Renal insufficiency    prior creatinine 1.2.Marland KitchenMarland Kitchen creatinine checked August 14, 2010 1.7.Marland KitchenMarland KitchenMarland Kitchen creatinine August 17, 2010  1.8  Review of Systems       the patient denies fever, chills, headache, sweats, rash, change in vision, change in hearing, chest pain, cough, nausea vomiting, urinary symptoms.  All other systems are reviewed and are negative.  Vital Signs:  Patient profile:   77 year old male Height:      69 inches Weight:      196 pounds BMI:     29.05 Pulse rate:   60 / minute BP sitting:   146 / 62  (left arm)  Vitals Entered  By: Laurance Flatten CMA (January 06, 2011 8:12 AM)  Physical Exam  General:  She looks quite good today. Eyes:  no xanthelasma. Neck:  no jugular venous distention. Lungs:  lungs are clear.  Respiratory effort is nonlabored. Heart:  cardiac exam reveals S1-S2.  No clicks or significant murmurs.  Rhythm is regular. Abdomen:  abdomen is soft. Extremities:  no peripheral edema. Psych:  patient is oriented to person time and place.  Affect is normal.   Impression & Recommendations:  Problem # 1:  DIARRHEA, CHRONIC (ICD-787.91) The patient has mild chronic diarrhea.  It seems to be slightly  worse on Multaq.  However this occurs only in the morning and he deals with this with no difficulty.  No change in therapy.  Problem # 2:  * MULTAQ RX Is doing well with this.  No change in therapy.  Problem # 3:  * PRADAXA RX we did lower the dose to 75 mg b.i.d.  He is stable with this.  Problem # 4:  AORTIC STENOSIS (ICD-424.1)  His updated medication list for this problem includes:    Metoprolol Tartrate 25 Mg Tabs (Metoprolol tartrate) .Marland Kitchen... 1/2 tab every pm only I see him next time we will decide about the timing of the next echo.  Problem # 5:  ATRIAL FIBRILLATION, PAROXYSMAL (ICD-427.31) She is holding sinus rhythm.  No change in therapy.  Patient Instructions: 1)  Your physician recommends that you schedule a follow-up appointment in: 6 months with Dr. Myrtis Ser 2)  Your physician recommends that you continue on your current medications as directed. Please refer to the Current Medication list given to you today.

## 2011-01-29 LAB — BASIC METABOLIC PANEL
BUN: 11 mg/dL (ref 6–23)
CO2: 24 mEq/L (ref 19–32)
Chloride: 108 mEq/L (ref 96–112)
Chloride: 110 mEq/L (ref 96–112)
GFR calc non Af Amer: 48 mL/min — ABNORMAL LOW (ref >60)
Glucose, Bld: 101 mg/dL — ABNORMAL HIGH (ref 70–99)
Potassium: 3.6 mEq/L (ref 3.5–5.1)
Potassium: 3.8 mEq/L (ref 3.5–5.1)
Sodium: 141 mEq/L (ref 135–145)

## 2011-01-29 LAB — CBC
HCT: 35.5 % — ABNORMAL LOW (ref 39.0–52.0)
HCT: 36.2 % — ABNORMAL LOW (ref 39.0–52.0)
HCT: 36.8 % — ABNORMAL LOW (ref 39.0–52.0)
Hemoglobin: 12 g/dL — ABNORMAL LOW (ref 13.0–17.0)
Hemoglobin: 12.4 g/dL — ABNORMAL LOW (ref 13.0–17.0)
MCH: 32.9 pg (ref 26.0–34.0)
MCH: 32.9 pg (ref 26.0–34.0)
MCHC: 33.4 g/dL (ref 30.0–36.0)
MCV: 101.1 fL — ABNORMAL HIGH (ref 78.0–100.0)
MCV: 98.1 fL (ref 78.0–100.0)
MCV: 98.4 fL (ref 78.0–100.0)
MCV: 98.6 fL (ref 78.0–100.0)
RBC: 3.6 MIL/uL — ABNORMAL LOW (ref 4.22–5.81)
RBC: 3.64 MIL/uL — ABNORMAL LOW (ref 4.22–5.81)
RBC: 3.65 MIL/uL — ABNORMAL LOW (ref 4.22–5.81)
RDW: 13.4 % (ref 11.5–15.5)
RDW: 13.6 % (ref 11.5–15.5)
WBC: 7.9 10*3/uL (ref 4.0–10.5)
WBC: 8.4 10*3/uL (ref 4.0–10.5)

## 2011-01-30 LAB — POCT CARDIAC MARKERS
Myoglobin, poc: 144 ng/mL (ref 12–200)
Myoglobin, poc: 92.9 ng/mL (ref 12–200)
Troponin i, poc: <0.05 ng/mL (ref 0.00–0.09)

## 2011-01-30 LAB — TROPONIN I: Troponin I: 0.03 ng/mL (ref 0.00–0.06)

## 2011-01-30 LAB — TSH: TSH: 1.077 u[IU]/mL (ref 0.350–4.500)

## 2011-01-30 LAB — CULTURE, BLOOD (ROUTINE X 2)

## 2011-01-30 LAB — FOLATE: Folate: >20 ng/mL

## 2011-01-30 LAB — COMPREHENSIVE METABOLIC PANEL
BUN: 18 mg/dL (ref 6–23)
CO2: 24 mEq/L (ref 19–32)
Chloride: 109 mEq/L (ref 96–112)
Creatinine, Ser: 1.29 mg/dL (ref 0.4–1.5)
GFR calc non Af Amer: 53 mL/min — ABNORMAL LOW (ref >60)
Total Bilirubin: 0.8 mg/dL (ref 0.3–1.2)

## 2011-01-30 LAB — DIFFERENTIAL
Basophils Relative: 0 % (ref 0–1)
Eosinophils Absolute: 0.2 10*3/uL (ref 0.0–0.7)
Monocytes Relative: 10 % (ref 3–12)
Neutrophils Relative %: 49 % (ref 43–77)

## 2011-01-30 LAB — CARDIAC PANEL(CRET KIN+CKTOT+MB+TROPI)
CK, MB: 4 ng/mL (ref 0.3–4.0)
Relative Index: 3.3 — ABNORMAL HIGH (ref 0.0–2.5)
Total CK: 111 U/L (ref 7–232)
Total CK: 121 U/L (ref 7–232)
Total CK: 92 U/L (ref 7–232)
Troponin I: 0.03 ng/mL (ref 0.00–0.06)
Troponin I: 0.03 ng/mL (ref 0.00–0.06)

## 2011-01-30 LAB — BASIC METABOLIC PANEL
CO2: 26 mEq/L (ref 19–32)
Calcium: 9 mg/dL (ref 8.4–10.5)
Creatinine, Ser: 1.2 mg/dL (ref 0.4–1.5)
Glucose, Bld: 112 mg/dL — ABNORMAL HIGH (ref 70–99)

## 2011-01-30 LAB — CBC
Hemoglobin: 12.8 g/dL — ABNORMAL LOW (ref 13.0–17.0)
Hemoglobin: 13 g/dL (ref 13.0–17.0)
MCH: 33.8 pg (ref 26.0–34.0)
MCH: 34.5 pg — ABNORMAL HIGH (ref 26.0–34.0)
MCHC: 33.2 g/dL (ref 30.0–36.0)
MCV: 100.8 fL — ABNORMAL HIGH (ref 78.0–100.0)
Platelets: 255 10*3/uL (ref 150–400)
RBC: 3.75 MIL/uL — ABNORMAL LOW (ref 4.22–5.81)

## 2011-01-30 LAB — CK TOTAL AND CKMB (NOT AT ARMC): Total CK: 120 U/L (ref 7–232)

## 2011-01-30 LAB — LIPID PANEL
Cholesterol: 140 mg/dL (ref 0–200)
LDL Cholesterol: 76 mg/dL (ref 0–99)

## 2011-01-30 LAB — D-DIMER, QUANTITATIVE: D-Dimer, Quant: 0.35 ug/mL-FEU (ref 0.00–0.48)

## 2011-03-20 ENCOUNTER — Other Ambulatory Visit: Payer: Self-pay | Admitting: Cardiology

## 2011-03-22 ENCOUNTER — Other Ambulatory Visit: Payer: Self-pay | Admitting: Oncology

## 2011-03-22 ENCOUNTER — Encounter (HOSPITAL_BASED_OUTPATIENT_CLINIC_OR_DEPARTMENT_OTHER): Payer: Medicare Other | Admitting: Oncology

## 2011-03-22 DIAGNOSIS — I4891 Unspecified atrial fibrillation: Secondary | ICD-10-CM

## 2011-03-22 DIAGNOSIS — C61 Malignant neoplasm of prostate: Secondary | ICD-10-CM

## 2011-03-22 DIAGNOSIS — D472 Monoclonal gammopathy: Secondary | ICD-10-CM

## 2011-03-22 LAB — CBC WITH DIFFERENTIAL/PLATELET
Eosinophils Absolute: 0.2 10*3/uL (ref 0.0–0.5)
HCT: 36.2 % — ABNORMAL LOW (ref 38.4–49.9)
LYMPH%: 29.9 % (ref 14.0–49.0)
MONO#: 0.6 10*3/uL (ref 0.1–0.9)
NEUT#: 4.3 10*3/uL (ref 1.5–6.5)
NEUT%: 58.8 % (ref 39.0–75.0)
Platelets: 241 10*3/uL (ref 140–400)
RBC: 3.64 10*6/uL — ABNORMAL LOW (ref 4.20–5.82)
WBC: 7.4 10*3/uL (ref 4.0–10.3)

## 2011-03-22 LAB — MORPHOLOGY: PLT EST: ADEQUATE

## 2011-03-23 LAB — BASIC METABOLIC PANEL
Calcium: 9.3 mg/dL (ref 8.4–10.5)
Chloride: 104 mEq/L (ref 96–112)
Creatinine, Ser: 1.4 mg/dL (ref 0.40–1.50)

## 2011-03-23 LAB — KAPPA/LAMBDA LIGHT CHAINS
Kappa free light chain: 2.04 mg/dL — ABNORMAL HIGH (ref 0.33–1.94)
Lambda Free Lght Chn: 1.61 mg/dL (ref 0.57–2.63)

## 2011-03-23 LAB — IGG: IgG (Immunoglobin G), Serum: 2380 mg/dL — ABNORMAL HIGH (ref 700–1600)

## 2011-03-29 ENCOUNTER — Encounter (HOSPITAL_BASED_OUTPATIENT_CLINIC_OR_DEPARTMENT_OTHER): Payer: Medicare Other | Admitting: Oncology

## 2011-03-29 DIAGNOSIS — D472 Monoclonal gammopathy: Secondary | ICD-10-CM

## 2011-03-29 DIAGNOSIS — C61 Malignant neoplasm of prostate: Secondary | ICD-10-CM

## 2011-03-30 NOTE — Assessment & Plan Note (Signed)
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  [name] [name] is actually doing well.  [name] [name] sees  Dr. Cyndie Chime on a regular basis.  [name] [name] has a monoclonal gammopathy that  has been stable and [name] [name] had follow-up urine studies in December and Dr.  Cyndie Chime follows him on a general medical basis and from the  hematologic basis.  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  [name] [name]  has some aches and pains, but is not having any significant chest pain.  [name] [name] did mention to me that on a very cold day in the winter on one  occasion when walking up a hill, [name] [name] had an unusual sensation in his  throat.  This was short-lived.  [name] [name] has had no recurrence since then.  [name] [name]  is active now and not having any exertional chest symptoms.   The patient had minimal coronary disease in the past.   PAST MEDICAL HISTORY:   ALLERGIES:  PENICILLIN AND DEXAMETHASONE?   MEDICATIONS:  Aspirin, Prilosec and Lipitor 10.   OTHER MEDICAL PROBLEMS:  See the list below.   REVIEW OF SYSTEMS:  His review of systems today is really negative.  [name] [name]  is doing very well.   PHYSICAL EXAMINATION:  VITAL SIGNS:  Blood pressure today in the office  is 157/80.  [name] [name] brings me his blood pressure checks from at home.  On a  very repeated basis, his pressure runs from 120-140 systolic.  There is  a rare 150.  His diastolics run from 68-80.  Pulse rate is 65.  GENERAL:  The patient is oriented to person, time and place.  Affect is  normal.  HEENT:  Reveals no xanthelasma.  [name] [name] has normal extraocular motion.  NECK:  There are no carotid bruits.  There is no jugulovenous  distention.  LUNGS:  Clear.  Respiratory effort is not labored.  CARDIAC:  Reveals an S1-S2.  [name] [name] has at 2-3/6 crescendo-decrescendo  systolic murmur.  We have  heard this before.  It may be slightly more  harsh.  ABDOMEN:  Soft.  EXTREMITIES:  [name] [name] has no significant peripheral edema.  There are no  musculoskeletal deformities.   No labs were done today.   PROBLEMS:  1. History of a presumed TIA in the past.  The patient had symptoms      and spoke with Dr. Sandria Manly, and they both agreed over the phone that      [name] [name] had, had a TIA and [name] [name] has been stable.  [name] [name] had been off his      aspirin for approximately 10 days at that time, and [name] [name] has not      stopped his aspirin since.  2. Mildly elevated LDL.  We did put him on low-dose Lipitor and his      last LDL was in the range of 100.  3.  History of multiple  skin      cancers.  3. Monoclonal gammopathy, followed by Dr. Cyndie Chime.  4. GERD with esophageal dilatation in 1992, and some history of      esophageal spasm.  5. History of a transurethral needle ablation of his prostate in 2004.  6. History of mild palpitations in the past, which have never been a      clinical problem.  7. Aortic valve sclerosis.  I believe the murmur may be slightly more      harsh and we will proceed with a 2-D echo to reassess his aortic      valve.  8. Mild AI and mild MR in the past.  9. Cervical spine disease that has been stable.  10.Minimal coronary disease by cath that was done by Dr. Andee Lineman in      2005.  11.Mild blood pressure elevation when [name] [name] sees the doctor, but it is      well controlled at home.  12.Question of carotid disease, although a Doppler was done in March      2008, showing no significant disease.   We will proceed with a 2-D echo, and I will call him with the result.  [name] [name] is doing well.  [name] [name] sees Dr. Cyndie Chime on a very regular basis.     [name] Abed, MD, [facility]  Electronically Signed    JDK/MedQ  DD: 03/06/2008  DT: 03/06/2008  Job #: [id number]   cc:   Genene Churn. Cyndie Chime, M.D.

## 2011-03-30 NOTE — Op Note (Signed)
NAME:  [name] [name], [name] [name] NO.:  [id number]   MEDICAL RECORD NO.:  [id number]          PATIENT TYPE:  AMB   LOCATION:  ENDO                         FACILITY:  [facility]   PHYSICIAN:  Bernette Redbird, M.D.   DATE OF BIRTH:  Sep 22, 1925   DATE OF PROCEDURE:  07/12/2007  DATE OF DISCHARGE:                               OPERATIVE REPORT   PROCEDURE:  Flexible sigmoidoscopy with polypectomy.   INDICATION:  An 77 year old gentleman who is about 6 months status post  removal of a fairly large sessile distal rectal polyp.  We are checking  for any residual tissue.   FINDINGS:  Small amount of residual tissue in the distal rectum,  cauterized with the snare.   PROCEDURE:  The nature, purpose and risks of the procedure were familiar  to the patient, who provided written consent.  He underwent a standard  colonoscopy prep, but, due to the distal nature of this lesion, it was  not felt that sedation would be needed.  Digital rectal exam was  unremarkable.  The Pentax video colonoscope was advanced about 20 cm and  pullback was then performed.  The quality of prep was excellent.   In the distal rectum, exactly between the two tattoo marks, was a small  amount of residual sessile polyp tissue, measuring probably 6 x 10 mm in  greatest dimension.  The snare was used to cauterize this tissue.  A  small piece was actually snared off but was not able to be retrieved for  histologic analysis since it apparently got lost of the scope.  In any  event, after applying cautery several more times, there was no visible  residual tissue and a nice eschar without any bleeding.  The scope was  removed from the patient.  He tolerated the procedure well and there no  apparent complications.   IMPRESSION:  Residual rectal polyp tissue cauterized as described above.   PLAN:  Sigmoidoscopic evaluation in the office in 6 months with further  treatment thereafter if residual tissues identified at that  time.           ______________________________  Bernette Redbird, M.D.     RB/MEDQ  D:  07/12/2007  T:  07/12/2007  Job:  [id number]   cc:   Genene Churn. Cyndie Chime, M.D.  Fax: [phone]

## 2011-04-02 NOTE — Assessment & Plan Note (Signed)
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 He had actually been  in [place] and had a cold and came home and was feeling better but then  had what sounds like a true episode of the flu despite the flu shot. He  was quite weak. He is recovering. His cough persists.   PAST MEDICAL HISTORY:   ALLERGIES:  PENICILLIN, DEXAMETHASONE.   MEDICATIONS:  Aspirin, Prilosec, Lipitor.   OTHER MEDICAL PROBLEMS:  See the list below.   REVIEW OF SYSTEMS:  He has had some discomfort with certain motions of  his head with pain up the left side of his neck into the back of his  head. This sounds musculoskeletal. He says it is not bothered by his  golf but this may be playing a role. He has also had some mild dizziness  with this flu episode as he has had difficulties taking in fluid and had  an unusual taste from salt. This is now improving.   Otherwise his review of systems is negative.   PHYSICAL EXAMINATION:  VITAL SIGNS:  Blood pressure today is 149/80. His  blood pressure has been borderline before. Recurrent blood pressures in  the past however have been normal. Will make sure he knows to keep an  eye on his blood pressure.  GENERAL:  The patient is oriented to person, time and place. Affect is  normal.  HEENT:  Reveals no xanthelasma. He has normal extraocular motion. There  is no jugular venous distention. There is a soft, left carotid bruit.  LUNGS:  Clear. Respiratory effort is not labored.  CARDIAC:  Reveals an S1 with an S2. He does have a 2/6 crescendo-  decrescendo murmur.  ABDOMEN:  Soft. There are no masses or bruits. He has no peripheral  edema. He has 1+ distal pulses.   EKG is normal.   PROBLEM LIST:  1. History of a TIA in the  past. Dr. Sandria Manly was aware but he actually      did not see Dr. Sandria Manly. He had a brief episode of problems with his      vision that resolved and this was while he was off aspirin for a      brief period of time.  2. Elevated LDL. He is on low dose Lipitor and he will repeat his      fasting lipid profile.  3. History of multiple skin cancers.  4. Monoclonal gammopathy followed by Dr. Cyndie Chime.  5. GERD with esophageal dilatation in 1992 and some esophageal spasm.  6. History of a transurethral needle ablation of his prostate in 2004.  7. Penicillin allergy.  8. History of mild palpitations in the past.  9. Aortic valve sclerosis. We will need to do a followup echo in the      next year.  10.Mild AI and mild MR in  the past.  11.Cervical spine disease which appears to be slightly more active.  12.Very minimal coronary disease.  13.Mild elevation of his blood pressure today.  14.Left carotid Doppler. A carotid Doppler will be done. He will watch      his blood pressure. I will see him back and we will consider an      echo at the time of his next visit.  15.Recent flu illness from which he is recovering slowly.     Luis Abed, MD, [facility]  Electronically Signed    JDK/MedQ  DD: 01/23/2007  DT: 01/23/2007  Job #: [id number]   cc:   Genene Churn. Cyndie Chime, M.D.  Genene Churn. Love, M.D.

## 2011-04-02 NOTE — Op Note (Signed)
[place]  Patient:    [name] [name], [name] [name] DR.                  MRN: [id number]. Date: 12/19/00 Attending:  Gita Kudo, M.D. CC:         Genene Churn. Cyndie Chime, M.D.  Sigmund I. Patsi Sears, M.D.   Operative Report  PREOPERATIVE DIAGNOSIS:  Left inguinal hernia.  POSTOPERATIVE DIAGNOSIS:  Left inguinal hernia, direct.  OPERATION PERFORMED:  Left inguinal hernia repair--direct, with preperitoneal and onlay Prolene mesh.  SURGEON:  Gita Kudo, M.D.  ANESTHESIA:  General.  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  Physical examination confirms and he comes in for elective repair.  OPERATIVE FINDINGS:  The patient had a medium sized direct inguinal hernia on the left side.   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  There was a very tiny indirect hernia.  The cord vessels as well as the nerves were identified and not injured.  DESCRIPTION OF PROCEDURE:  Under satisfactory general anesthesia, having had 1.0 gm Ancef preoperatively, the patients abdomen was prepped and draped in a standard fashion.  A transverse incision was made and carried down to and through the external oblique.  With good exposure, using self-retaining retractors, the cord and its contents were mobilized with a Penrose drain.  A lipoma was excised after tying it high with 3-0 Vicryl suture.  Then the small indirect hernia was twisted and controlled with a 0 Prolene suture ligature and was so small that I did not even excise the remnant.  Then the floor of the canal was opened from the pubis to the internal ring and finger dissection used to develop a preperitoneal space and reduce the hernia contents.  They were held away with a gauze while a portion of the mesh was tailored into an oval to fit in the preperitoneal space.  It was anchored at [facility] ligament with a single 0 Prolene suture and then  unfolded inferiorly and laterally. The gauze was then removed and the mesh was unfolded medially and superiorly, lying nicely in the preperitoneal space.  The floor was then closed over this with running sutures of 0 Prolene taking intermittent bites of the mesh to hold it in good position.  At the internal ring, the suture was tied and the ends left long.  The remainder of the mesh was tailored into an oval with a slit to go around the cord structures.  It was anchored at the internal ring with the previous sutures and then tacked around the periphery with interrupted #0 Prolene to the inguinal ligament below and the internal oblique above.  The tails were then brought around the cord and sutured to each other and the fascia above and lateral to the internal ring.  The wound was then infiltrated with 30 cc of 0.5% Marcaine with epinephrine for postoperative analgesia.  It was lavaged with saline and closed in layers with running 2-0 Vicryl suture for external oblique, interrupted 2-0 Vicryl for deep fascia, 3-0 Vicryl for subcutaneous, Steri-Strips for skin.  Sterile absorbent dressings were then applied and the patient went to the recovery room from the operating room in good condition. The sponge and needle counts were correct and there were no complications. DD:  12/19/00 TD:  12/19/00 Job: [id number]

## 2011-04-02 NOTE — Op Note (Signed)
NAME:  [name], [name] NO.:  [id number]   MEDICAL RECORD NO.:  [id number]                   PATIENT TYPE:  AMB   LOCATION:  DAY                                  FACILITY:  [facility]   PHYSICIAN:  Sigmund I. Patsi Sears, M.D.         DATE OF BIRTH:  June 03, 1925   DATE OF PROCEDURE:  06/14/2003  DATE OF DISCHARGE:                                 OPERATIVE REPORT   PREOPERATIVE DIAGNOSES:  1. Prostate cancer.  2. Benign prostatic hypertrophy  3. Bladder outlet obstruction.   POSTOPERATIVE DIAGNOSES:  1. Prostate cancer.  2. Benign prostatic hypertrophy  3. Bladder outlet obstruction.   PROCEDURE:  1. Cystoscopy.  2. Transurethral resection of the prostate gland.   SURGEON:  Sigmund I. Patsi Sears, M.D.   ASSISTANT:  Susanne Borders, MD   ANESTHESIA:  General endotracheal.   INDICATION FOR PROCEDURE:  [name] is a 77 year old male with a history  of prostate cancer.  He has been on maximal medical therapy and has had  transurethral needle ablation of his prostate gland.  He continues to have  severe low urinary tract symptoms.  After counseling the patient, he has  decided to undergo a transurethral resection of the prostate gland.   DESCRIPTION OF PROCEDURE:  Patient brought to the operating room and  correctly identified by his identification bracelet.  He was given  preoperative antibiotics and general endotracheal anesthesia.  He was placed  in a dorsal lithotomy position and prepped and draped in a typical sterile  fashion.  The 26 French resectoscope sheath with obturator was inserted  through the patient's urethra into his bladder.  The 12-degree lens with  resectoscope was used to visualize the patient's bladder.  The patient had  marked bladder trabeculation with cellules diffusely.  He had no obvious  mucosal abnormalities.  Both ureteral orifices were seen in their normal  anatomical location, each effluxing clear urine.  The  resectoscope was then  used to remove the prostatic adenoma from approximately 2 o'clock to 10  o'clock.  There was minimal bleeding throughout the procedure, and the  bleeding was fulgurated with the loop.  At the end of the procedure, the  patient had a nice open prostatic urethra.  Great care was taken not to  resect past the verumontanum, hence, traumatizing the sphincter.  At the end  of the procedure, there was  no bleeding with the irrigation off.  The resectoscope was removed, and a  Foley catheter was placed.  Please note that Sigmund I. Patsi Sears, M.D. was  the primary surgeon and participated throughout all aspects of the case.  The patient awakened easily from his anesthesia and was taken to the  postanesthesia care unit in a stable condition.     Susanne Borders, MD                           Sigmund I.  Patsi Sears, M.D.    DR/MEDQ  D:  06/14/2003  T:  06/14/2003  Job:  [id number]

## 2011-04-02 NOTE — Cardiovascular Report (Signed)
NAME:  [name] [name], [name] [name] NO.:  [id number]   MEDICAL RECORD NO.:  [id number]                   PATIENT TYPE:  OIB   LOCATION:  6501                                 FACILITY:  [facility]   PHYSICIAN:  Learta Codding, M.D. [facility]             DATE OF BIRTH:  [birth year]   DATE OF PROCEDURE:  02/04/2004  DATE OF DISCHARGE:  02/04/2004                              CARDIAC CATHETERIZATION   PROCEDURE:  1. Left heart cardiac catheterization with selective coronary angiography.  2. Ventriculography.   CARDIOLOGIST:  Learta Codding, M.D.   REFERRING PHYSICIANS:  1. Genene Churn. Cyndie Chime, M.D.  2. Willa Rough, M.D.   DIAGNOSIS:  1. No evidence of significant flow limiting coronary artery disease.  2. Mild atherosclerosis and calcification of the proximal left anterior     descending.  3. Normal left ventricular systolic function.  4. Mildly dilated aortic root but no evidence of aneurysm.   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 Myrtis Ser.  The patient reports over the last several weeks episodes of chest  discomfort radiating over the entire precordium as well as a heavy feeling  down the arms.  A Cardiolite study has been negative for ischemia.  Dr. Myrtis Ser  referred Dr. [name] for further evaluation to rule out ischemic heart  disease.   DESCRIPTION OF PROCEDURE:  After informed consent was obtained, the patient  was brought to the catheterization laboratory.  The right groin was  sterilely prepped and draped.  A 4 French arterial sheath was placed using  the modified Seldinger technique.  The 4 [place] and JR4 catheter were  used to engage the left and right coronary ostia.  Selective coronary  angiography was performed in various projections using manual injections of  contrast.  Subsequently, a 6 French angled pigtail catheter was used for  ventriculography.  Images were obtained in a single plane RAO projection.  Appropriate left-sided hemodynamics were obtained.  At the termination of  the procedure, all catheter sheaths were removed, the patient was brought  back to the holding area.  No complications were encountered.   FINDINGS:  HEMODYNAMICS:  Left ventricular pressure is 128/7 mmHg.  Aortic  pressure 128/68 mmHg.   VENTRICULOGRAPHY:  Ejection fraction was 70% without wall motion  abnormalities.  No mitral regurgitation was noted.  In the ventriculography,  we pinned down along the aorta.  There was no evidence of abdominal aortic  aneurysm or renal artery stenosis.  The aortic root was mildly dilated  without a frank aneurysm.   SELECTIVE CORONARY ANGIOGRAPHY:  1. The left main coronary artery was a large caliber vessel which     demonstrated some calcification.  There was no evidence of flow limiting     defect.  2. The left anterior descending artery is a large caliber vessel which     demonstrated diffuse blocking in the proximal segment  of approximately     20% stenosis and the mid vessel had a 20-30% diffuse stenosis.  The     diagonal branch was free of flow-limiting disease.  3. The circumflex coronary artery was a relatively small vessel. There were     two obtuse marginal branches, the second one being a very large branch     reaching towards the apex.  No flow-limiting disease was seen.  4. The right coronary artery was dominant.  The RCA terminates in a large     posterior descending and several large posterior lateral branches.  The     RCA proper was free of flow-limiting lesions as well as the terminal     branch.   CONCLUSION:  No definite evidence of flow limiting coronary artery disease.  This is essentially benign cardiac catheterization with no evidence of  aneurysm and normal left ventricular function.  I have discussed the  findings with Dr. Myrtis Ser.                                               Learta Codding, M.D. [facility]    GED/MEDQ  D:  02/04/2004  T:  02/06/2004   Job:  [id number]   cc:   Genene Churn. Cyndie Chime, M.D.  [address] [facility]  [place]  [place] [address]  Fax: [phone]   Willa Rough, M.D.

## 2011-04-05 ENCOUNTER — Telehealth: Payer: Self-pay | Admitting: *Deleted

## 2011-04-05 NOTE — Telephone Encounter (Signed)
Pt called this AM and c/o a-fib during golf yest, he states he went home and rested, he is fine in SR now HR 72, he states he is on Multaq 600 mg a day and metoprolol 12.5 mg daily wanted to know if he should increase meds, discussed w/Dr Myrtis Ser will have pt continue current meds for now and continue to monitor, pt is aware and agreeable.

## 2011-04-25 ENCOUNTER — Other Ambulatory Visit: Payer: Self-pay | Admitting: Cardiology

## 2011-06-10 ENCOUNTER — Encounter: Payer: Self-pay | Admitting: Cardiology

## 2011-06-22 IMAGING — US US RENAL
1 series · 14 of 25 positions shown · non-contrast
Comparison: None.

CLINICAL DATA: Rising creatinine level, history of prostate
carcinoma

RENAL/URINARY TRACT ULTRASOUND COMPLETE

[Series 1: us renal · 0.22mm/px · 14 of 43 slices shown]
[im 1/43]
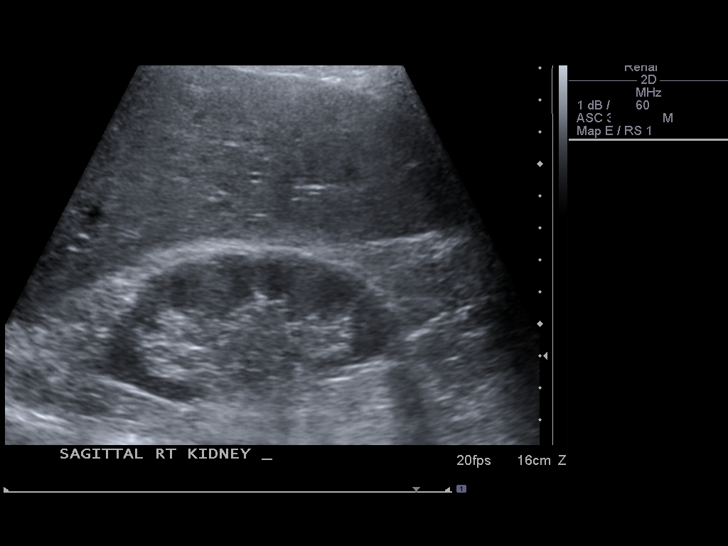
[im 4/43]
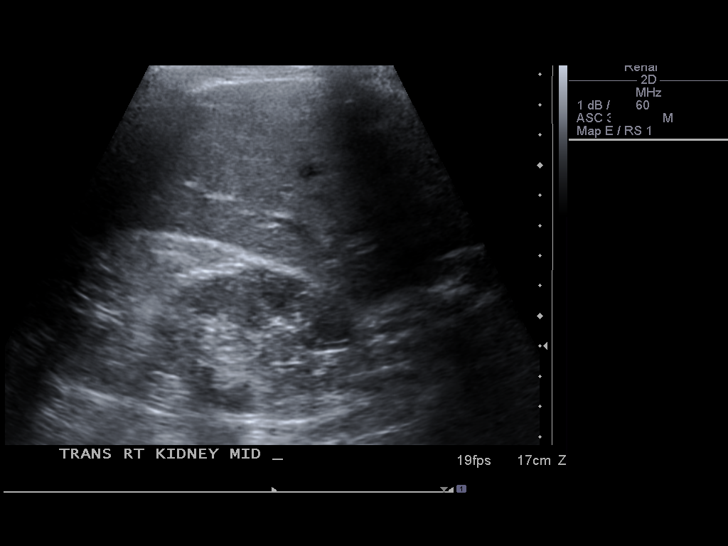
[im 8/43]
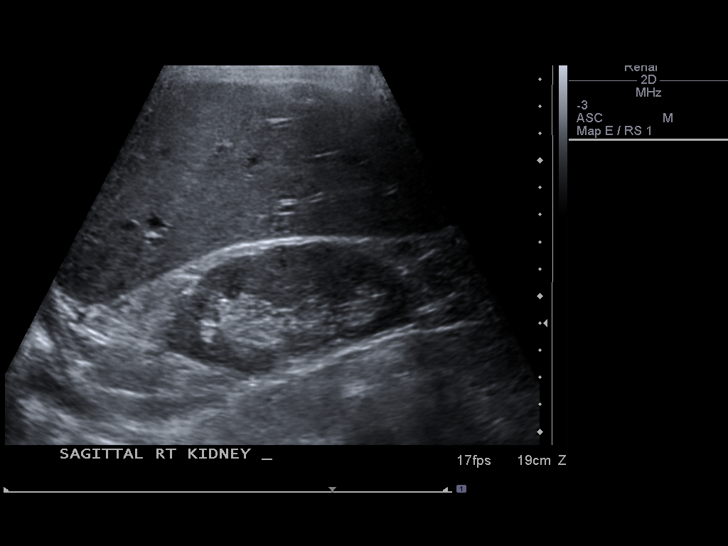
[im 11/43]
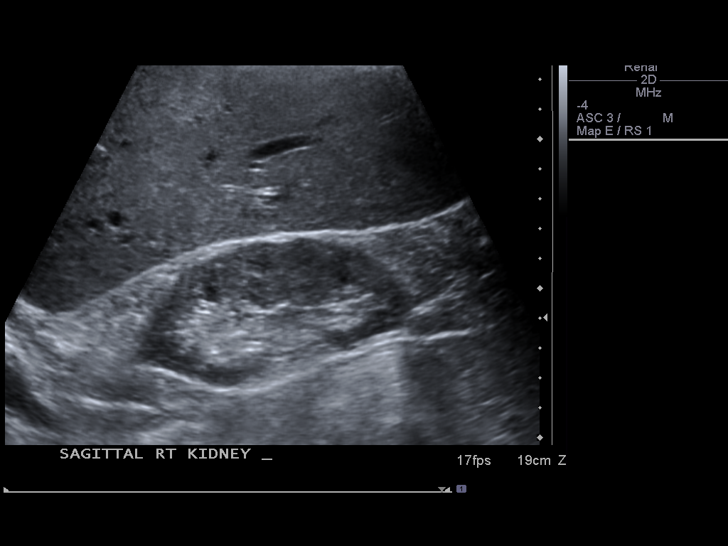
[im 15/43]
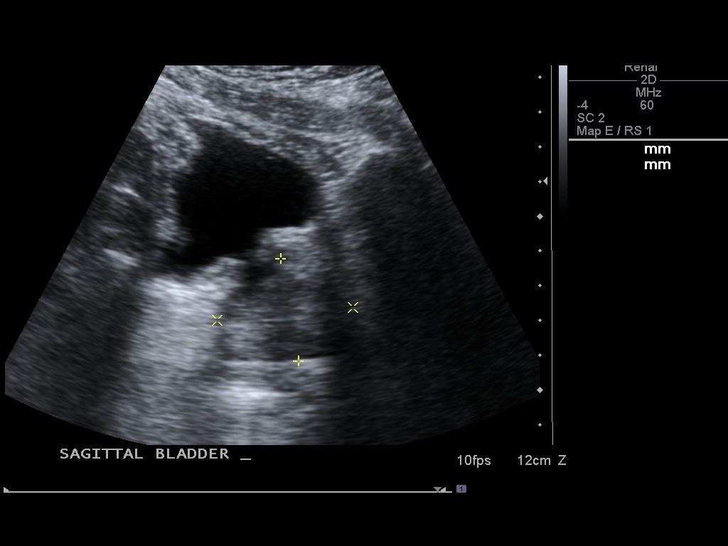
[im 16/43]
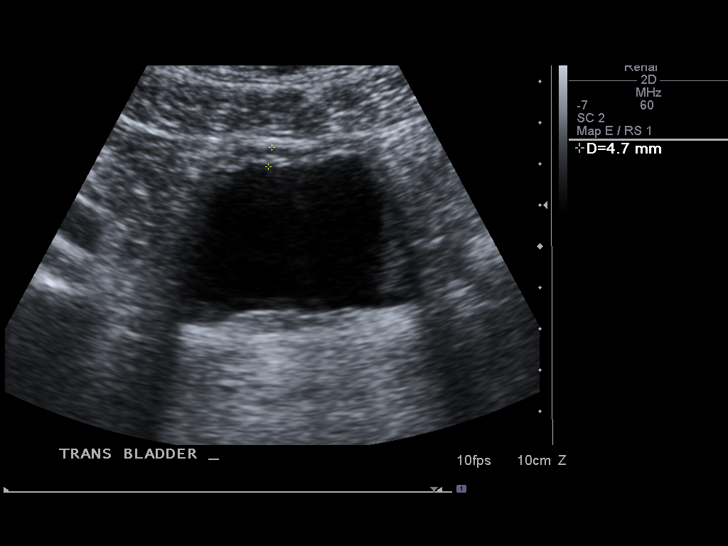
[im 20/43]
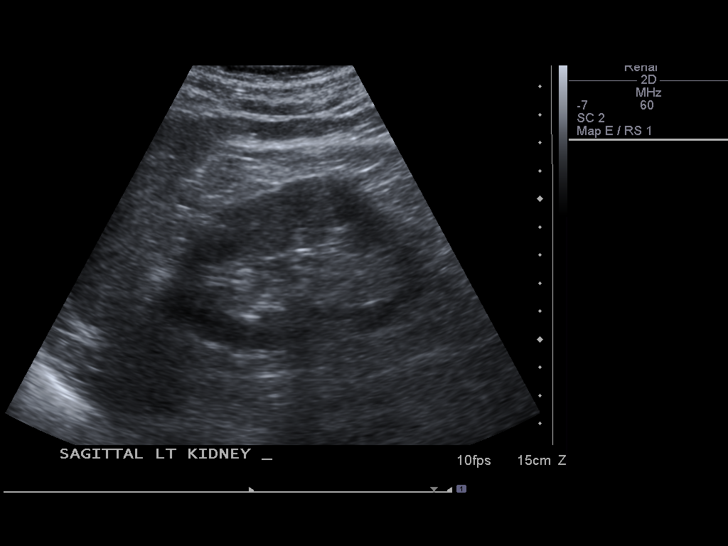
[im 23/43]
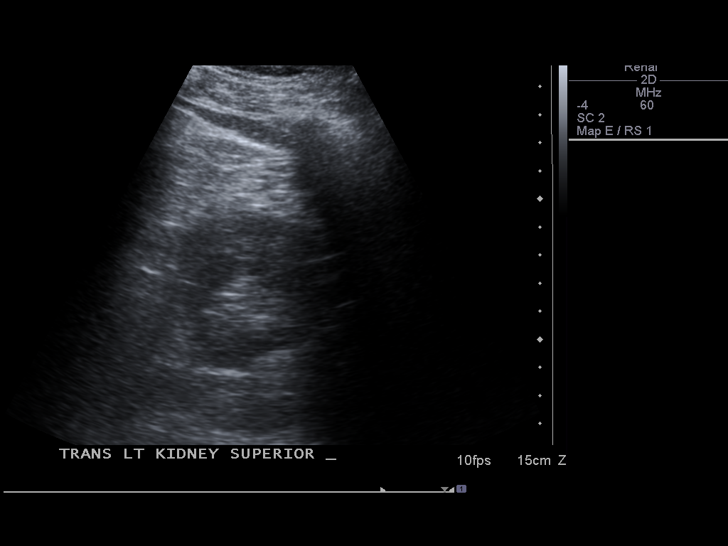
[im 27/43]
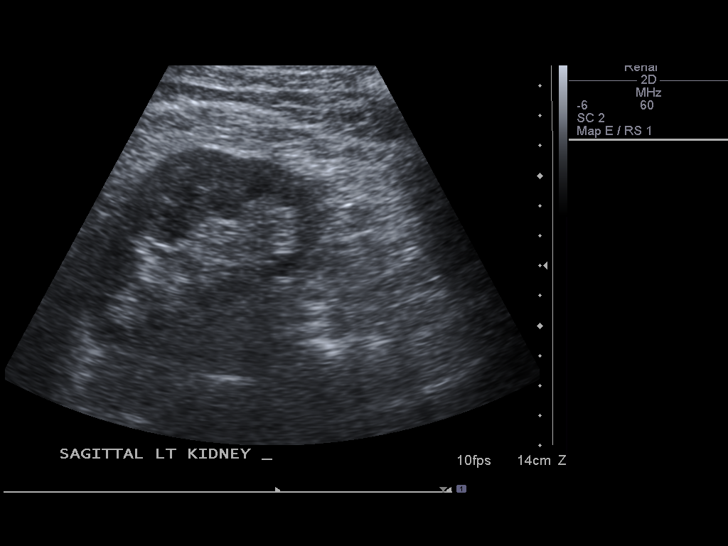
[im 29/43]
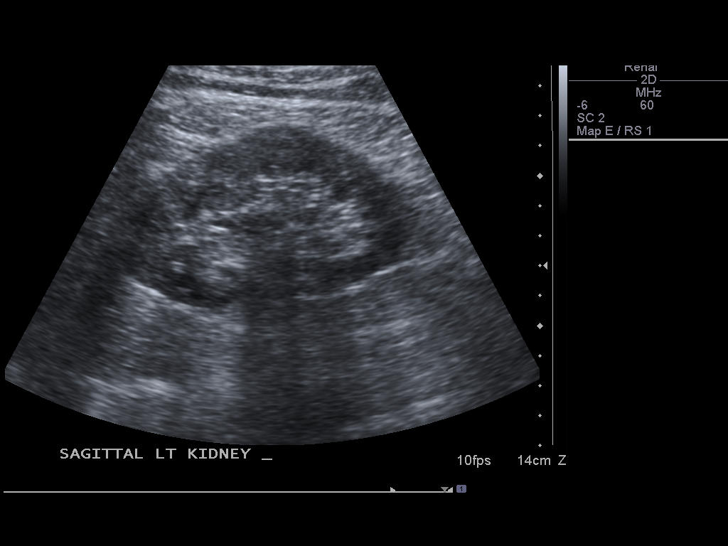
[im 32/43]
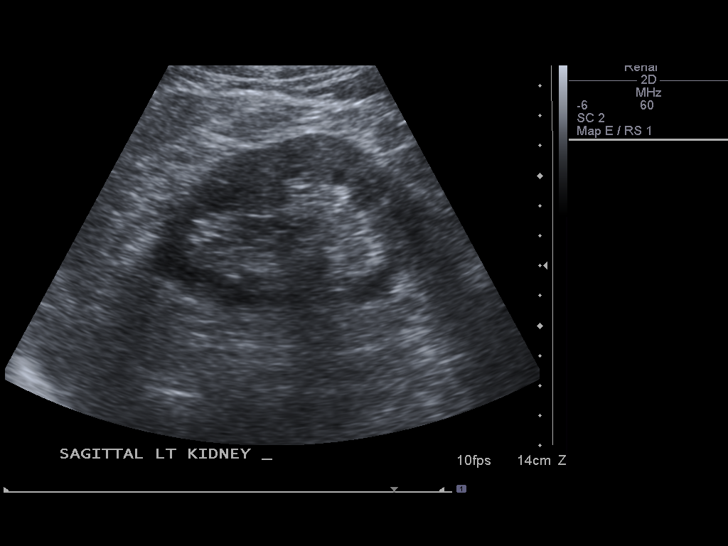
[im 36/43]
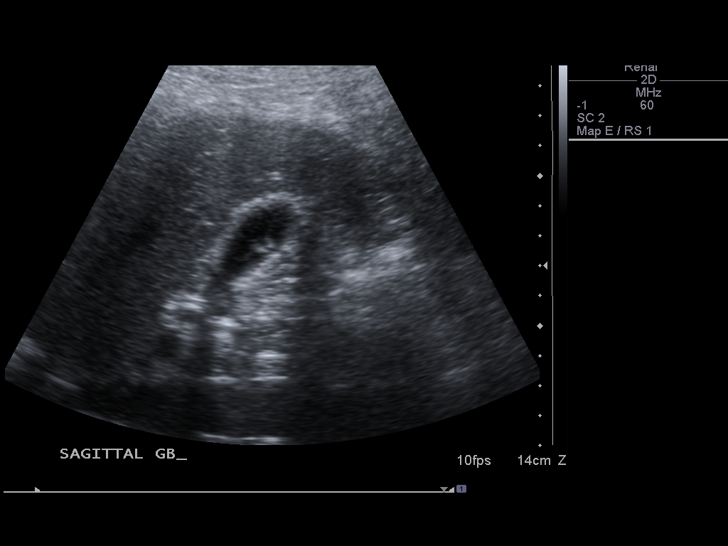
[im 39/43]
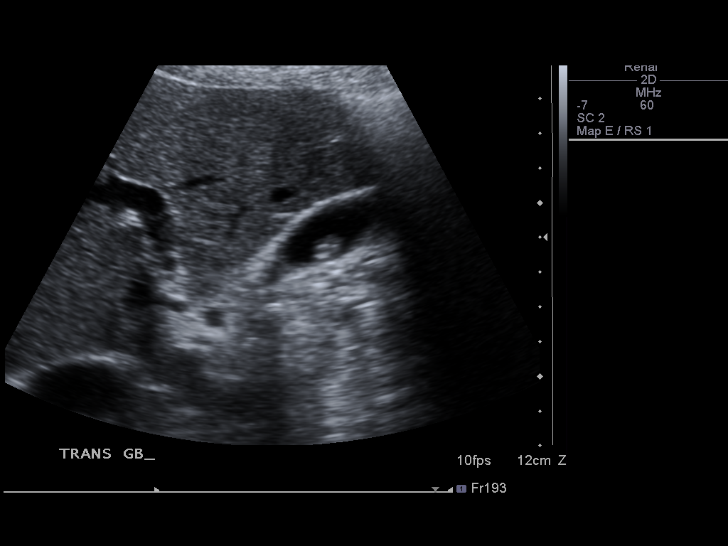
[im 43/43]
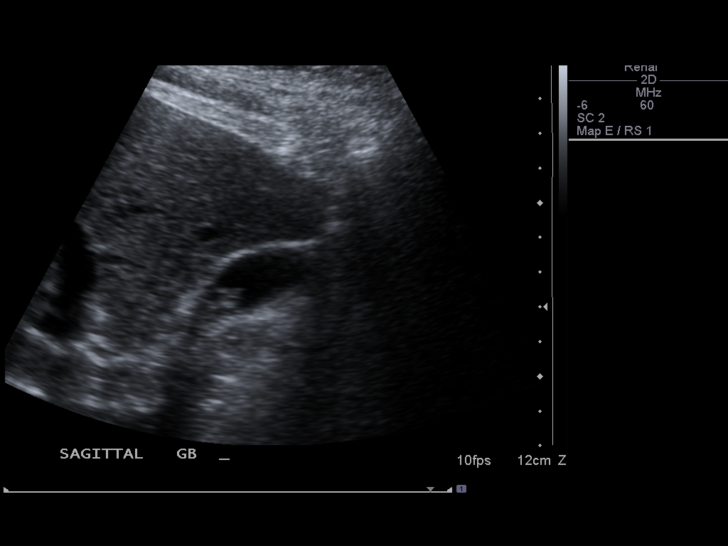

[14 of 25 positions shown; findings below may reference images not displayed]

FINDINGS: Right Kidney:  No hydronephrosis is seen.  The right kidney
measures 10.0 cm sagittally.

Left Kidney:  No hydronephrosis.  The left kidney measures 10.1 cm.

Bladder:  The urinary bladder is not distended and therefore the
urinary bladder wall is somewhat prominent.  The prostate measures
3.0 x 3.9 x 4.4 cm.

Incidental echogenicity is noted in the gallbladder consistent with
nonshadowing gallstones versus gallbladder sludge.
IMPRESSION: 1.  No hydronephrosis.
2.  Gallbladder sludge versus nonshadowing gallstones incidentally
noted.

## 2011-06-28 ENCOUNTER — Other Ambulatory Visit: Payer: Self-pay | Admitting: Cardiology

## 2011-07-02 ENCOUNTER — Other Ambulatory Visit: Payer: Self-pay | Admitting: *Deleted

## 2011-07-02 MED ORDER — DABIGATRAN ETEXILATE MESYLATE 75 MG PO CAPS: 75.0000 mg | ORAL_CAPSULE | Freq: Two times a day (BID) | ORAL | Status: DC

## 2011-07-02 NOTE — Telephone Encounter (Signed)
rx sent in today

## 2011-07-14 ENCOUNTER — Encounter: Payer: Self-pay | Admitting: Cardiology

## 2011-07-14 DIAGNOSIS — I2510 Atherosclerotic heart disease of native coronary artery without angina pectoris: Secondary | ICD-10-CM | POA: Insufficient documentation

## 2011-07-14 DIAGNOSIS — M489 Spondylopathy, unspecified: Secondary | ICD-10-CM | POA: Insufficient documentation

## 2011-07-14 DIAGNOSIS — Z9079 Acquired absence of other genital organ(s): Secondary | ICD-10-CM | POA: Insufficient documentation

## 2011-07-14 DIAGNOSIS — I4891 Unspecified atrial fibrillation: Secondary | ICD-10-CM | POA: Insufficient documentation

## 2011-07-14 DIAGNOSIS — E785 Hyperlipidemia, unspecified: Secondary | ICD-10-CM | POA: Insufficient documentation

## 2011-07-14 DIAGNOSIS — N289 Disorder of kidney and ureter, unspecified: Secondary | ICD-10-CM | POA: Insufficient documentation

## 2011-07-14 DIAGNOSIS — I350 Nonrheumatic aortic (valve) stenosis: Secondary | ICD-10-CM | POA: Insufficient documentation

## 2011-07-14 DIAGNOSIS — G459 Transient cerebral ischemic attack, unspecified: Secondary | ICD-10-CM | POA: Insufficient documentation

## 2011-07-14 DIAGNOSIS — I351 Nonrheumatic aortic (valve) insufficiency: Secondary | ICD-10-CM | POA: Insufficient documentation

## 2011-07-14 DIAGNOSIS — D472 Monoclonal gammopathy: Secondary | ICD-10-CM | POA: Insufficient documentation

## 2011-07-15 ENCOUNTER — Ambulatory Visit (INDEPENDENT_AMBULATORY_CARE_PROVIDER_SITE_OTHER): Payer: Medicare Other | Admitting: Cardiology

## 2011-07-15 ENCOUNTER — Encounter: Payer: Self-pay | Admitting: Cardiology

## 2011-07-15 DIAGNOSIS — Z5189 Encounter for other specified aftercare: Secondary | ICD-10-CM

## 2011-07-15 DIAGNOSIS — I4510 Unspecified right bundle-branch block: Secondary | ICD-10-CM

## 2011-07-15 DIAGNOSIS — I350 Nonrheumatic aortic (valve) stenosis: Secondary | ICD-10-CM

## 2011-07-15 DIAGNOSIS — I359 Nonrheumatic aortic valve disorder, unspecified: Secondary | ICD-10-CM

## 2011-07-15 DIAGNOSIS — Z79899 Other long term (current) drug therapy: Secondary | ICD-10-CM

## 2011-07-15 DIAGNOSIS — I4891 Unspecified atrial fibrillation: Secondary | ICD-10-CM

## 2011-07-15 LAB — BASIC METABOLIC PANEL
BUN: 23 mg/dL (ref 6–23)
CO2: 26 mEq/L (ref 19–32)
Calcium: 8.7 mg/dL (ref 8.4–10.5)
Chloride: 108 mEq/L (ref 96–112)
Creatinine, Ser: 1.4 mg/dL (ref 0.4–1.5)

## 2011-07-15 NOTE — Assessment & Plan Note (Signed)
This is a new finding for him.  However I'm not convinced that represents any significant abnormality.  No further workup at this time.

## 2011-07-15 NOTE — Patient Instructions (Signed)
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   You will receive a reminder letter in the mail two months in advance. If you don't receive a letter, please call our office to schedule the follow-up appointment.

## 2011-07-15 NOTE — Progress Notes (Signed)
HPI Patient is seen for followup atrial fibrillation.  He's actually doing well.  Is not having chest pain or shortness of breath.  I saw him last January 06, 2011.  As part of today's visit I have carefully reviewed his old records and updated the current EMR.  The patient had brief atrial fibrillation while playing golf in May, 2012.  We're in touch with him by telephone and decided not to change his medicine dosing.  He has not had any recurrence since then.  He notices that if he walks up a hill he has some shortness of breath.  This resolved rapidly after stopping.  He does not feel tachypalpitations.  There is no chest pain.  He is fully active. Allergies  Allergen Reactions  . Penicillins     Current Outpatient Prescriptions  Medication Sig Dispense Refill  . aspirin 81 MG tablet Take 81 mg by mouth. One tab Monday, Wednesday, Friday       . atorvastatin (LIPITOR) 10 MG tablet take 1 tablet by mouth once daily  30 tablet  6  . dabigatran (PRADAXA) 75 MG CAPS Take 1 capsule (75 mg total) by mouth every 12 (twelve) hours.  60 capsule  6  . dronedarone (MULTAQ) 400 MG tablet 1 tab am 1/2 tab qhs      . metoprolol tartrate (LOPRESSOR) 25 MG tablet Take 12.5 mg by mouth every evening.        . naproxen sodium (ANAPROX) 220 MG tablet Take 220 mg by mouth as needed.        Marland Kitchen omeprazole (PRILOSEC) 20 MG capsule Take 20 mg by mouth. Take 1 tablet Monday, Wednesday, Friday         History   Social History  . Marital Status: Widowed    Spouse Name: N/A    Number of Children: N/A  . Years of Education: N/A   Occupational History  . Not on file.   Social History Main Topics  . Smoking status: Former Games developer  . Smokeless tobacco: Not on file  . Alcohol Use: Yes  . Drug Use: No  . Sexually Active:    Other Topics Concern  . Not on file   Social History Narrative  . No narrative on file    No family history on file.  Past Medical History  Diagnosis Date  . Coronary artery  disease     minimal, cath 2005 / catheterization August, 2011, 30% in 3 vessels, normal LV function  . Hyperlipidemia   . Atrial fibrillation 2011    Multaq Started September, 2011, dose adjusted October, 2011, 200 mg a.m., 400 mg p.o.  . Monoclonal gammopathy     granfortuna  . TIA (transient ischemic attack)     Dr. Sandria Manly,, question in the past. when off ASA for 10 days  . GERD (gastroesophageal reflux disease)     Esophageal dilatation in 1992, some esophageal spasm  . S/P transurethral resection of prostate 2004    2004  . MR (mitral regurgitation) 2009    mild, Echo, July, 2011  . Cervical spine disease   . Renal insufficiency     Prior creatinine 1.2 /  September, 2011.. 1.7  /  October, 2011.. 1.8  . Ejection fraction     EF 60%, echo, 2009  /  TEE normal August, 2011 /   EF 60%, echo, July, 2011  . Drug therapy     Pradaxa Started July, 2011  . Aortic stenosis  Mild, echo, July, 2011  . Aortic insufficiency     Mild, echo, July, 2011  . Carotid artery disease     Doppler, 200 weight, no significant abnormality  . Diarrhea     Chronic  . Incomplete right bundle branch block     August, 2012    Past Surgical History  Procedure Date  . History of turp   . Hernia repair   . Fibular fracture repair   . Cataract extraction     ROS  Patient denies fever, chills, headache, sweats, rash, change in vision, change in hearing, chest pain, cough, nausea vomiting, urinary symptoms.  All other systems are reviewed and are negative.  PHYSICAL EXAM Patient looks quite good today.  Head is atraumatic.  There's no jugular venous distentio lungs are clear.  Respiratory effort is unlabored.  Cardiac exam reveals S1 and S2.   There is a 2/6 crescendo decrescendo systolic murmur.  Abdomen is soft there's no peripheral edema.  There are no musculoskeletal deformities.  There are no skin rashes. Filed Vitals:   07/15/11 0859  BP: 150/75  Pulse: 59  Resp: 12  Height: 5\' 9"  (1.753  m)  Weight: 197 lb (89.359 kg)    EKG Is done today and reviewed by me.  It is compared with old tracings.  The QRS is 114 ms.  The pattern now is incomplete right bundle branch block.  He did not have this in the past.  There is normal sinus rhythm  ASSESSMENT & PLAN

## 2011-07-15 NOTE — Assessment & Plan Note (Addendum)
He will continue on Pradaxa.  He is on a an adjusted dose. I have reviewed the recommendations for followup labs the patient on Pradaxa.  These include the following of renal function.  The patient's doses already adjusted downward.  This would be important for him for both renal function and because he is on Multaq.

## 2011-07-15 NOTE — Assessment & Plan Note (Signed)
His valvular heart disease is stable.  He had an echo one year ago.  I have decided not to proceed with a followup echo at this time.  I think that his shortness of breath when walking up a hill is baseline for him.

## 2011-07-15 NOTE — Assessment & Plan Note (Addendum)
The patient is holding sinus rhythm.  His EKG has changed incomplete right bundle branch block.  This is not a significant change.  He will be kept on the same dose of Multaq. I have reviewed the recommendations for lab followup on patient's on Multaq.  These include careful attention to potassium and magnesium.  Recently his potassium was normal.  A magnesium can be obtained with his next labs.  Creatinine is being followed.  Liver functions  Should also be followed.

## 2011-07-20 NOTE — Progress Notes (Signed)
Pt notified of results

## 2011-07-20 NOTE — Progress Notes (Signed)
LMTC

## 2011-09-20 ENCOUNTER — Other Ambulatory Visit: Payer: Self-pay | Admitting: Internal Medicine

## 2011-09-27 ENCOUNTER — Telehealth: Payer: Self-pay | Admitting: Oncology

## 2011-09-27 ENCOUNTER — Other Ambulatory Visit (HOSPITAL_BASED_OUTPATIENT_CLINIC_OR_DEPARTMENT_OTHER): Payer: Medicare Other | Admitting: Lab

## 2011-09-27 ENCOUNTER — Other Ambulatory Visit: Payer: Self-pay | Admitting: Oncology

## 2011-09-27 DIAGNOSIS — C61 Malignant neoplasm of prostate: Secondary | ICD-10-CM

## 2011-09-27 DIAGNOSIS — D472 Monoclonal gammopathy: Secondary | ICD-10-CM

## 2011-09-27 DIAGNOSIS — I4891 Unspecified atrial fibrillation: Secondary | ICD-10-CM

## 2011-09-27 LAB — CBC WITH DIFFERENTIAL/PLATELET
BASO%: 0.3 % (ref 0.0–2.0)
EOS%: 4 % (ref 0.0–7.0)
LYMPH%: 35.5 % (ref 14.0–49.0)
MCHC: 34.3 g/dL (ref 32.0–36.0)
MCV: 99 fL — ABNORMAL HIGH (ref 79.3–98.0)
MONO%: 9.1 % (ref 0.0–14.0)
Platelets: 230 10*3/uL (ref 140–400)
RBC: 3.52 10*6/uL — ABNORMAL LOW (ref 4.20–5.82)
RDW: 13.5 % (ref 11.0–14.6)

## 2011-09-27 NOTE — Progress Notes (Signed)
Received call from [facility] in scheduling re: pt's F/U appt to be scheduled for January.  Pt reports January is "unacceptable."  Pt currently has no lab or F/U appts scheduled.  Note to Dr Cyndie Chime. dph

## 2011-09-28 LAB — COMPREHENSIVE METABOLIC PANEL
AST: 22 U/L (ref 0–37)
Albumin: 4 g/dL (ref 3.5–5.2)
Alkaline Phosphatase: 40 U/L (ref 39–117)
BUN: 21 mg/dL (ref 6–23)
Calcium: 9.1 mg/dL (ref 8.4–10.5)
Chloride: 106 mEq/L (ref 96–112)
Creatinine, Ser: 1.41 mg/dL — ABNORMAL HIGH (ref 0.50–1.35)
Glucose, Bld: 97 mg/dL (ref 70–99)
Potassium: 4.5 mEq/L (ref 3.5–5.3)

## 2011-09-28 LAB — IGG: IgG (Immunoglobin G), Serum: 2170 mg/dL — ABNORMAL HIGH (ref 650–1600)

## 2011-09-28 LAB — KAPPA/LAMBDA LIGHT CHAINS: Kappa:Lambda Ratio: 1.94 — ABNORMAL HIGH (ref 0.26–1.65)

## 2011-10-13 ENCOUNTER — Encounter: Payer: Self-pay | Admitting: *Deleted

## 2011-10-13 NOTE — Progress Notes (Unsigned)
Recent labs routed to Dr. Jerral Bonito per Dr. Cyndie Chime & copy of labs mailed to pt.

## 2011-10-21 ENCOUNTER — Other Ambulatory Visit: Payer: Self-pay | Admitting: Cardiology

## 2011-11-22 ENCOUNTER — Telehealth: Payer: Self-pay | Admitting: Oncology

## 2011-11-22 ENCOUNTER — Other Ambulatory Visit: Payer: Self-pay | Admitting: Oncology

## 2011-11-22 ENCOUNTER — Encounter: Payer: Self-pay | Admitting: Oncology

## 2011-11-22 DIAGNOSIS — D472 Monoclonal gammopathy: Secondary | ICD-10-CM

## 2011-11-22 DIAGNOSIS — I4891 Unspecified atrial fibrillation: Secondary | ICD-10-CM

## 2011-11-22 DIAGNOSIS — C61 Malignant neoplasm of prostate: Secondary | ICD-10-CM

## 2011-11-22 HISTORY — DX: Malignant neoplasm of prostate: C61

## 2011-11-22 NOTE — Telephone Encounter (Signed)
Dr Reece Agar had stated 5/14 but wed. Are not good for the pt.  Appts made and printed for 5/6 and 5/13.    aom

## 2011-11-29 ENCOUNTER — Other Ambulatory Visit: Payer: Self-pay | Admitting: Surgery

## 2011-12-06 ENCOUNTER — Other Ambulatory Visit: Payer: Self-pay

## 2011-12-06 DIAGNOSIS — H6090 Unspecified otitis externa, unspecified ear: Secondary | ICD-10-CM

## 2011-12-06 MED ORDER — NEOMYCIN-POLYMYXIN-HC 3.5-10000-1 OT SOLN: 3.0000 [drp] | Freq: Three times a day (TID) | OTIC | Status: AC

## 2011-12-08 ENCOUNTER — Other Ambulatory Visit: Payer: Self-pay | Admitting: *Deleted

## 2011-12-08 DIAGNOSIS — C61 Malignant neoplasm of prostate: Secondary | ICD-10-CM

## 2011-12-08 DIAGNOSIS — D472 Monoclonal gammopathy: Secondary | ICD-10-CM

## 2011-12-08 MED ORDER — OXYCODONE-ACETAMINOPHEN 5-325 MG PO TABS: 1.0000 | ORAL_TABLET | ORAL | Status: AC | PRN

## 2011-12-08 MED ORDER — MOXIFLOXACIN HCL 400 MG PO TABS: 400.0000 mg | ORAL_TABLET | Freq: Every day | ORAL | Status: AC

## 2012-01-26 ENCOUNTER — Other Ambulatory Visit: Payer: Self-pay | Admitting: Cardiology

## 2012-01-27 ENCOUNTER — Encounter: Payer: Self-pay | Admitting: Cardiology

## 2012-01-31 ENCOUNTER — Ambulatory Visit: Payer: Medicare Other | Admitting: Cardiology

## 2012-02-09 ENCOUNTER — Other Ambulatory Visit: Payer: Self-pay | Admitting: Cardiology

## 2012-03-20 ENCOUNTER — Other Ambulatory Visit (HOSPITAL_BASED_OUTPATIENT_CLINIC_OR_DEPARTMENT_OTHER): Payer: Medicare Other | Admitting: Lab

## 2012-03-20 DIAGNOSIS — I4891 Unspecified atrial fibrillation: Secondary | ICD-10-CM

## 2012-03-20 DIAGNOSIS — C61 Malignant neoplasm of prostate: Secondary | ICD-10-CM

## 2012-03-20 DIAGNOSIS — D472 Monoclonal gammopathy: Secondary | ICD-10-CM

## 2012-03-20 LAB — CBC WITH DIFFERENTIAL/PLATELET
BASO%: 0.4 % (ref 0.0–2.0)
Eosinophils Absolute: 0.3 10*3/uL (ref 0.0–0.5)
LYMPH%: 34.9 % (ref 14.0–49.0)
MCHC: 33.5 g/dL (ref 32.0–36.0)
MONO#: 0.8 10*3/uL (ref 0.1–0.9)
NEUT#: 3.6 10*3/uL (ref 1.5–6.5)
Platelets: 229 10*3/uL (ref 140–400)
RBC: 3.55 10*6/uL — ABNORMAL LOW (ref 4.20–5.82)
RDW: 13.5 % (ref 11.0–14.6)
WBC: 7.2 10*3/uL (ref 4.0–10.3)
lymph#: 2.5 10*3/uL (ref 0.9–3.3)
nRBC: 0 % (ref 0–0)

## 2012-03-22 LAB — IMMUNOFIXATION ELECTROPHORESIS: Total Protein, Serum Electrophoresis: 7.5 g/dL (ref 6.0–8.3)

## 2012-03-22 LAB — COMPREHENSIVE METABOLIC PANEL
ALT: 16 U/L (ref 0–53)
AST: 20 U/L (ref 0–37)
CO2: 25 mEq/L (ref 19–32)
Calcium: 8.9 mg/dL (ref 8.4–10.5)
Chloride: 106 mEq/L (ref 96–112)
Creatinine, Ser: 1.48 mg/dL — ABNORMAL HIGH (ref 0.50–1.35)
Potassium: 4.7 mEq/L (ref 3.5–5.3)
Sodium: 138 mEq/L (ref 135–145)
Total Protein: 7.5 g/dL (ref 6.0–8.3)

## 2012-03-22 LAB — PSA: PSA: 2.62 ng/mL (ref <=4.00)

## 2012-03-22 LAB — KAPPA/LAMBDA LIGHT CHAINS: Kappa:Lambda Ratio: 1.99 — ABNORMAL HIGH (ref 0.26–1.65)

## 2012-03-27 ENCOUNTER — Telehealth: Payer: Self-pay | Admitting: Oncology

## 2012-03-27 ENCOUNTER — Encounter: Payer: Self-pay | Admitting: Oncology

## 2012-03-27 ENCOUNTER — Ambulatory Visit (HOSPITAL_BASED_OUTPATIENT_CLINIC_OR_DEPARTMENT_OTHER): Payer: Medicare Other | Admitting: Oncology

## 2012-03-27 VITALS — BP 178/79 | HR 56 | Temp 98.1°F | Ht 65.0 in | Wt 197.5 lb

## 2012-03-27 DIAGNOSIS — R319 Hematuria, unspecified: Secondary | ICD-10-CM | POA: Insufficient documentation

## 2012-03-27 DIAGNOSIS — C61 Malignant neoplasm of prostate: Secondary | ICD-10-CM

## 2012-03-27 DIAGNOSIS — D472 Monoclonal gammopathy: Secondary | ICD-10-CM

## 2012-03-27 HISTORY — DX: Hematuria, unspecified: R31.9

## 2012-03-27 NOTE — Telephone Encounter (Signed)
appts made and printed for pt aom °

## 2012-03-27 NOTE — Progress Notes (Signed)
Hematology and Oncology Follow Up Visit  [name] [name] [id number] [birth year] 78 y.o. 03/27/2012 7:35 PM   Principle Diagnosis: Encounter Diagnoses  Name Primary?  . Prostate cancer   . Monoclonal gammopathy Yes  . 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 Overall doing well since his last visit until the other night when he had an episode of sudden painless gross hematuria. He is on Pradaxa as thromboprophylaxis for atrial fibrillation current dose 75 mg twice daily. He held his evening dose. Hematuria resolved and he took the morning dose. He had a second episode the following evening and again held the evening dose.  Medications: reviewed  Allergies:  Allergies  Allergen Reactions  . Penicillins     Review of Systems: Constitutional:   No constitutional symptoms Respiratory: No cough or dyspnea Cardiovascular:  No chest pain or palpitations Gastrointestinal: Genito-Urinary: See above Musculoskeletal: Chronic back pain Neurologic: Skin: Remaining ROS negative.  Physical Exam: Blood pressure 178/79, pulse 56, temperature 98.1 F (36.7 C), temperature source Oral, height 5\' 5"  (1.651 m), weight 197 lb 8 oz (89.585 kg). Wt Readings from Last 3 Encounters:  03/27/12 197 lb 8 oz (89.585 kg)  07/15/11 197 lb (89.359 kg)  01/06/11 196 lb (88.905 kg)     General appearance: Well-nourished Caucasian man HENNT: Pharynx no erythema or exudate Lymph nodes: Stable 2 cm deep lymph node left axilla Breasts: Lungs: Clear to auscultation resonant to percussion Heart: Regular rhythm 3/6 systolic murmur loudest at the second right intercostal space Abdomen: Soft nontender no mass no organomegaly Extremities: No edema no calf tenderness Vascular: No cyanosis Neurologic: Mental status intact, cranial nerves grossly normal, motor  strength 5 over 5, reflexes 1+ symmetric. Skin: No rash or ecchymosis  Lab Results: Lab Results  Component Value Date   WBC 7.2 03/20/2012   HGB 11.9* 03/20/2012   HCT 35.5* 03/20/2012   MCV 100.0* 03/20/2012   PLT 229 03/20/2012     Chemistry      Component Value Date/Time   NA 138 03/20/2012 1049   K 4.7 03/20/2012 1049   CL 106 03/20/2012 1049   CO2 25 03/20/2012 1049   BUN 25* 03/20/2012 1049   CREATININE 1.48* 03/20/2012 1049   CREATININE 1.61* 08/26/2010 0955      Component Value Date/Time   CALCIUM 8.9 03/20/2012 1049   ALKPHOS 37* 03/20/2012 1049   AST 20 03/20/2012 1049   ALT 16 03/20/2012 1049   BILITOT 0.6 03/20/2012 1049    Serum IgG 2600 mg percent compared with 2170 09/27/2011 and 23 right ear on 03/22/2011 IgA and IgM remain in normal range. There is monoclonal IgG kappa on immunofixation electrophoresis. Serum kappa free light chains 4.99 compared with 2.54 lambda free light chains 2.5 on compared with 1.31 ratio 1.99 (0.26-1.65). Hemoglobin recorded above is stable compared with previous values. PSA is 2.62 compared to 0.57 in November and 2.19 in December 2011. BUN 25 creatinine 1.48 compared with 1.4 in November and 1.4 in May of 2012   Impression and Plan: #1. IgG kappa monoclonal gammopathy of undetermined significance. Initial diagnosis July 2000. Lab parameters remain stable. No evidence for transformation to multiple myeloma at this time. Plan: Continue every 6 month laboratory monitoring.  #2. Localized prostate cancer treated with radiofrequency ablation at time of diagnosis in February of 2000. Stable PSA.  #3. 2 episodes of  self-limited gross hematuria This may be a result of anticoagulation with Pradaxa. However, I told him to get in touch with his urologist. Given his history of prostate cancer, he will likely need to have a survey cystoscopy.  #4. Single vessel coronary artery disease.  #5. Aortic systolic murmur with aortic sclerosis but no stenosis on  echocardiogram.  #6. Atrial fibrillation diagnosed 2 years ago. Currently stable on Multaq(Dronedarone). 200 mg in the morning 400 mg in the evening. Previous relapse after DC cardioversion. Currently on Pradaxa 75 mg twice a day.  #7. GERD  #8. Hyperlipidemia  #9. Status post excision multiple basal cell carcinomas  #10. Degenerative arthritis of the spine.  #11. Remote history of a transient ischemic attack.   CC:. Dr. Willa Rough; Dr. Jethro Bolus; Dr. Bernette Redbird   [name], MD 5/13/20137:35 PM

## 2012-04-03 ENCOUNTER — Telehealth: Payer: Self-pay | Admitting: Cardiology

## 2012-04-03 NOTE — Telephone Encounter (Deleted)
FYI:  Patient called to cancel appnt and notify doctor she had a stroke and has been in the hospital for the last week.

## 2012-04-04 ENCOUNTER — Encounter: Payer: Self-pay | Admitting: Cardiology

## 2012-04-04 DIAGNOSIS — I739 Peripheral vascular disease, unspecified: Secondary | ICD-10-CM

## 2012-04-05 ENCOUNTER — Encounter: Payer: Self-pay | Admitting: Cardiology

## 2012-04-05 ENCOUNTER — Ambulatory Visit (INDEPENDENT_AMBULATORY_CARE_PROVIDER_SITE_OTHER): Payer: Medicare Other | Admitting: Cardiology

## 2012-04-05 VITALS — BP 150/80 | HR 55 | Ht 67.0 in | Wt 195.0 lb

## 2012-04-05 DIAGNOSIS — R319 Hematuria, unspecified: Secondary | ICD-10-CM

## 2012-04-05 DIAGNOSIS — I4891 Unspecified atrial fibrillation: Secondary | ICD-10-CM

## 2012-04-05 DIAGNOSIS — I2510 Atherosclerotic heart disease of native coronary artery without angina pectoris: Secondary | ICD-10-CM

## 2012-04-05 DIAGNOSIS — Z79899 Other long term (current) drug therapy: Secondary | ICD-10-CM

## 2012-04-05 DIAGNOSIS — I350 Nonrheumatic aortic (valve) stenosis: Secondary | ICD-10-CM

## 2012-04-05 DIAGNOSIS — I359 Nonrheumatic aortic valve disorder, unspecified: Secondary | ICD-10-CM

## 2012-04-05 DIAGNOSIS — Z5189 Encounter for other specified aftercare: Secondary | ICD-10-CM

## 2012-04-05 NOTE — Assessment & Plan Note (Signed)
I am hopeful that his recent hematuria will resolve with the antibiotics so that his pradaxa can be restarted. If there is an ongoing issue with bleeding my choice would be for him to be on pradaxa with aspirin held. He feels strongly that he wants to remain on aspirin if possible. He takes it only 81 mg 3 days per week. He would try to restart his per DAX and and be in touch with me with his progress.

## 2012-04-05 NOTE — Assessment & Plan Note (Signed)
Aortic stenosis is mild. We'll plan a followup echo next year.

## 2012-04-05 NOTE — Assessment & Plan Note (Signed)
The patient is maintaining normal sinus rhythm. He is receiving Multaq. No change in therapy.

## 2012-04-05 NOTE — Assessment & Plan Note (Signed)
The patient has failed his pradaxa. It will be very important to get this restarted if we can.

## 2012-04-05 NOTE — Assessment & Plan Note (Signed)
Patient has mild coronary disease. No change in therapy.

## 2012-04-05 NOTE — Patient Instructions (Signed)
Your physician recommends that you schedule a follow-up appointment in: 6 months. The office will mail you a reminder letter 2 months prior appointment date. Your physician recommends that you continue on your current medications as directed. Please refer to the Current Medication list given to you today. 

## 2012-04-05 NOTE — Progress Notes (Signed)
HPI Patient is seen today to followup atrial fibrillation. He has not had significant palpitations. He has been on pradaxa. Recently he developed significant hematuria. With this he put his pradaxa on hold briefly. When he restarted he had more bleeding. Eventually cystoscopy was done by Dr. Lonia Mad. The patient describes some areas of abnormality. There was also question of some prostatitis. He was put on a short course of antibiotics. We are hoping that this will resolve the problem.  Patient also mentioned that during the winter he might have a very vague discomfort when walking to and from his mailbox. This was very short lived and he is not having a problem now.     Allergies  Allergen Reactions  . Penicillins     Current Outpatient Prescriptions  Medication Sig Dispense Refill  . aspirin 81 MG tablet Take 81 mg by mouth daily.       Marland Kitchen atorvastatin (LIPITOR) 10 MG tablet take 1 tablet by mouth once daily  30 tablet  6  . dronedarone (MULTAQ) 400 MG tablet Take 1.5 tablets (600 mg total) by mouth daily.  60 tablet  6  . metoprolol tartrate (LOPRESSOR) 25 MG tablet Take 12.5 mg by mouth daily.       . naproxen sodium (ANAPROX) 220 MG tablet Take 220 mg by mouth as needed.        Marland Kitchen omeprazole (PRILOSEC) 20 MG capsule Take 20 mg by mouth. Take 1 tablet Monday, Wednesday, Friday, Saturday      . DISCONTD: dronedarone (MULTAQ) 400 MG tablet 1 tab am 1/2 tab qhs      . dabigatran (PRADAXA) 75 MG CAPS Take 1 capsule (75 mg total) by mouth every 12 (twelve) hours.  60 capsule  6    History   Social History  . Marital Status: Widowed    Spouse Name: N/A    Number of Children: N/A  . Years of Education: N/A   Occupational History  . Not on file.   Social History Main Topics  . Smoking status: Former Games developer  . Smokeless tobacco: Not on file  . Alcohol Use: Yes  . Drug Use: No  . Sexually Active:    Other Topics Concern  . Not on file   Social History Narrative  . No  narrative on file    No family history on file.  Past Medical History  Diagnosis Date  . Coronary artery disease     minimal, cath 2005 / catheterization August, 2011, 30% in 3 vessels, normal LV function  . Hyperlipidemia   . Atrial fibrillation 2011    Multaq Started September, 2011, dose adjusted October, 2011, 200 mg a.m., 400 mg p.o.  . Monoclonal gammopathy     granfortuna  . TIA (transient ischemic attack)     Dr. Sandria Manly,, question in the past. when off ASA for 10 days  . GERD (gastroesophageal reflux disease)     Esophageal dilatation in 1992, some esophageal spasm  . S/P transurethral resection of prostate 2004    2004  . MR (mitral regurgitation) 2009    mild, Echo, July, 2011  . Cervical spine disease   . Renal insufficiency     Prior creatinine 1.2 /  September, 2011.. 1.7  /  October, 2011.. 1.8  . Ejection fraction     EF 60%, echo, 2009  /  TEE normal August, 2011 /   EF 60%, echo, July, 2011  . Drug therapy     Pradaxa Started  July, 2011  . Aortic stenosis     Mild, echo, July, 2011  . Aortic insufficiency     Mild, echo, July, 2011  . Carotid artery disease     Doppler, 2008 no significant abnormality  . Diarrhea     Chronic  . Incomplete right bundle branch block     August, 2012  . Prostate cancer 11/22/2011  . Painless hematuria 03/27/2012    Occurred x 2 5/12 & 5/13 on Pradaxa 75 mg BID    Past Surgical History  Procedure Date  . History of turp   . Hernia repair   . Fibular fracture repair   . Cataract extraction     ROS Patient denies fever, chills, headache, sweats, rash, change in vision, change in hearing, chest pain, cough, nausea vomiting. All other systems are are reviewed and negative other than the history of present illness.  PHYSICAL EXAM  Patient is stable today. There is no jugulovenous distention. Lungs are clear. Respiratory effort is nonlabored. Cardiac exam reveals S1 and S2. There are no clicks or significant murmurs. The  abdomen is soft. He has no significant peripheral edema.  Filed Vitals:   04/05/12 1024  BP: 150/80  Pulse: 55  Height: 5\' 7"  (1.702 m)  Weight: 195 lb (88.451 kg)     ASSESSMENT & PLAN

## 2012-05-31 ENCOUNTER — Other Ambulatory Visit: Payer: Self-pay | Admitting: Cardiology

## 2012-07-18 ENCOUNTER — Telehealth: Payer: Self-pay

## 2012-07-18 NOTE — Telephone Encounter (Signed)
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  He increased his Multaq to 400mg  bid (he was on 200am and 400pm) and his  his Metoprolol 12.5mg  bid on Sunday but states he hasn't converted back to nsr.  He states his bp this am is 120/70 and his heart rate=92.

## 2012-07-18 NOTE — Telephone Encounter (Signed)
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  He is also to see Dr Myrtis Ser next week or a PA this week per Dr Excell Seltzer.  Dr [name] chose to see Dr Myrtis Ser next week, appt was made and pt will call back if increasing the metoprolol does not help.  He was also offered an appt this am for an ekg but does not want to come in for one.

## 2012-07-24 ENCOUNTER — Encounter: Payer: Self-pay | Admitting: Cardiology

## 2012-07-24 ENCOUNTER — Ambulatory Visit (INDEPENDENT_AMBULATORY_CARE_PROVIDER_SITE_OTHER): Payer: Medicare Other | Admitting: Cardiology

## 2012-07-24 VITALS — BP 141/98 | HR 53 | Wt 197.0 lb

## 2012-07-24 DIAGNOSIS — I4891 Unspecified atrial fibrillation: Secondary | ICD-10-CM

## 2012-07-24 DIAGNOSIS — I2510 Atherosclerotic heart disease of native coronary artery without angina pectoris: Secondary | ICD-10-CM

## 2012-07-24 DIAGNOSIS — I350 Nonrheumatic aortic (valve) stenosis: Secondary | ICD-10-CM

## 2012-07-24 DIAGNOSIS — IMO0002 Reserved for concepts with insufficient information to code with codable children: Secondary | ICD-10-CM

## 2012-07-24 DIAGNOSIS — I359 Nonrheumatic aortic valve disorder, unspecified: Secondary | ICD-10-CM

## 2012-07-24 DIAGNOSIS — R9430 Abnormal result of cardiovascular function study, unspecified: Secondary | ICD-10-CM

## 2012-07-24 DIAGNOSIS — R0989 Other specified symptoms and signs involving the circulatory and respiratory systems: Secondary | ICD-10-CM

## 2012-07-24 DIAGNOSIS — I4510 Unspecified right bundle-branch block: Secondary | ICD-10-CM

## 2012-07-24 DIAGNOSIS — N289 Disorder of kidney and ureter, unspecified: Secondary | ICD-10-CM

## 2012-07-24 DIAGNOSIS — R319 Hematuria, unspecified: Secondary | ICD-10-CM

## 2012-07-24 DIAGNOSIS — Z01812 Encounter for preprocedural laboratory examination: Secondary | ICD-10-CM

## 2012-07-24 LAB — CBC WITH DIFFERENTIAL/PLATELET
Basophils Relative: 0.4 % (ref 0.0–3.0)
Eosinophils Relative: 2.8 % (ref 0.0–5.0)
HCT: 36.3 % — ABNORMAL LOW (ref 39.0–52.0)
Hemoglobin: 12 g/dL — ABNORMAL LOW (ref 13.0–17.0)
Lymphs Abs: 2.5 10*3/uL (ref 0.7–4.0)
Monocytes Relative: 9.3 % (ref 3.0–12.0)
Neutro Abs: 4.6 10*3/uL (ref 1.4–7.7)
Platelets: 228 10*3/uL (ref 150.0–400.0)
RBC: 3.62 Mil/uL — ABNORMAL LOW (ref 4.22–5.81)
WBC: 8.1 10*3/uL (ref 4.5–10.5)

## 2012-07-24 LAB — BASIC METABOLIC PANEL
CO2: 25 mEq/L (ref 19–32)
Calcium: 9 mg/dL (ref 8.4–10.5)
Glucose, Bld: 87 mg/dL (ref 70–99)
Sodium: 139 mEq/L (ref 135–145)

## 2012-07-24 LAB — PROTIME-INR: INR: 1.4 ratio — ABNORMAL HIGH (ref 0.8–1.0)

## 2012-07-24 NOTE — Assessment & Plan Note (Signed)
Today he has right bundle branch block.

## 2012-07-24 NOTE — Assessment & Plan Note (Addendum)
The patient has return of atrial fibrillation 10 days ago. At that time he increased his Multaq back up to 400 by mouth twice a day and his Pradaxa back up to 75 twice a day. He is in atrial fibrillation today. He asked me to cardiovert him today. I made it very clear to him that this could not be done. I also explained to him that I would prefer for him to be on full dose anticoagulation for 21 days. The patient is insistent that he is fully anticoagulated and has been. 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 He insists that we proceed with cardioversion without waiting longer for his anticoagulation.  I have agreed to proceed on September 11 with cardioversion. This will be in the setting of 12 of Pradaxa 75 mg twice a day. As outlined she had previously been on once daily. He made it clear to me that he would sign any type of release concerning this approach. I will show him this note on that day and asked him to sign. Overall I suspect that he will be safe.   IMPORTANT   07/25/2012:  I spent a great deal of time considering the timing of the cardioversion. I decided that it must weight until the 17th. I called the patient and he understands. The cardioversion will be moved over to August 01, 2012. IMPORTANT

## 2012-07-24 NOTE — Assessment & Plan Note (Signed)
He has mild aortic stenosis and mild aortic insufficiency by echo in July, 2011. He will need a followup echo at some point but not yet.

## 2012-07-24 NOTE — H&P (Signed)
[name] Rough, MD  07/24/2012  4:50 PM  Pended    HPI   The patient is seen today to followup atrial fibrillation. I spent an extensive amount of time with him today reviewing the history of his atrial fibrillation and discussing his medications. Historically the patient developed atrial fibrillation in 2011. He had a TEE cardioversion but reverted to atrial fib within a week. He was then loaded with  Mulltaq. He chose this  medication as he did not want amiodarone. He converted rapidly to sinus rhythm. He has been treated with pradaxa. It was appropriate to use 75 mg twice daily in combination with Multaq. He had some diarrhea and ultimately lowered the Multaq dose to 400 mg in the morning and 200 mg in the evening. The patient has done relatively well. He then developed some painless hematuria. He has had some urology studies. Ultimately he cut his pradaxa to 75 mg daily. 10 days ago he developed recurrent atrial fibrillation. He feels well at rest. However he has marked fatigue with any exercise. He increased his Multaq to 400 twice a day And he increased his pradaxa to 75 mg twice a day. He continues in atrial fibrillation.    Allergies   Allergen  Reactions   .  Guaifenesin & Derivatives     .  Penicillins         Current Outpatient Prescriptions   Medication  Sig  Dispense  Refill   .  aspirin 81 MG tablet  Take 81 mg by mouth daily.          Marland Kitchen  atorvastatin (LIPITOR) 10 MG tablet  take 1 tablet by mouth once daily   30 tablet   9   .  dabigatran (PRADAXA) 75 MG CAPS  Take 1 capsule (75 mg total) by mouth every 12 (twelve) hours.   60 capsule   6   .  dronedarone (MULTAQ) 400 MG tablet  Take 400 mg by mouth 2 (two) times daily with a meal.         .  metoprolol tartrate (LOPRESSOR) 25 MG tablet  Take 25 mg by mouth 2 (two) times daily.          .  naproxen sodium (ANAPROX) 220 MG tablet  Take 220 mg by mouth as needed.           Marland Kitchen  omeprazole (PRILOSEC) 20 MG capsule  Take 20 mg by  mouth. Take 1 tablet Monday, Wednesday, Friday, Saturday         .  DISCONTD: dronedarone (MULTAQ) 400 MG tablet  Take 1.5 tablets (600 mg total) by mouth daily.   60 tablet   6       History       Social History   .  Marital Status:  Widowed       Spouse Name:  N/A       Number of Children:  N/A   .  Years of Education:  N/A       Occupational History   .  Not on file.       Social History Main Topics   .  Smoking status:  Former Games developer   .  Smokeless tobacco:  Not on file   .  Alcohol Use:  Yes   .  Drug Use:  No   .  Sexually Active:         Other Topics  Concern   .  Not on file       Social History Narrative   .  No narrative on file      No family history on file.    Past Medical History   Diagnosis  Date   .  Coronary artery disease         minimal, cath 2005 / catheterization August, 2011, 30% in 3 vessels, normal LV function   .  Hyperlipidemia     .  Atrial fibrillation  2011       Multaq Started September, 2011, dose adjusted October, 2011, 200 mg a.m., 400 mg p.o.   .  Monoclonal gammopathy         granfortuna   .  TIA (transient ischemic attack)         Dr. Sandria Manly,, question in the past. when off ASA for 10 days   .  GERD (gastroesophageal reflux disease)         Esophageal dilatation in 1992, some esophageal spasm   .  S/P transurethral resection of prostate  2004       2004   .  MR (mitral regurgitation)  2009       mild, Echo, July, 2011   .  Cervical spine disease     .  Renal insufficiency         Prior creatinine 1.2 /  September, 2011.. 1.7  /  October, 2011.. 1.8   .  Ejection fraction         EF 60%, echo, 2009  /  TEE normal August, 2011 /   EF 60%, echo, July, 2011   .  Drug therapy         Pradaxa Started July, 2011   .  Aortic stenosis         Mild, echo, July, 2011   .  Aortic insufficiency         Mild, echo, July, 2011   .  Carotid artery disease         Doppler, 2008 no significant abnormality   .  Diarrhea          Chronic   .  Incomplete right bundle branch block         August, 2012   .  Prostate cancer  11/22/2011   .  Painless hematuria  03/27/2012       Occurred x 2 5/12 & 5/13 on Pradaxa 75 mg BID       Past Surgical History   Procedure  Date   .  History of turp     .  Hernia repair     .  Fibular fracture repair     .  Cataract extraction        ROS    Patient denies fever, chills, headache, sweats, rash, change in vision, change in hearing, chest pain, cough, nausea vomiting, urinary symptoms. All other systems are reviewed and are negative.   PHYSICAL EXAM  Patient is oriented to person time and place. Affect is normal. There is no jugulovenous distention. Lungs are clear. Respiratory effort is nonlabored. Cardiac exam reveals S1 and S2. There are no clicks. He does have a soft systolic murmur consistent with his mild aortic stenosis. The abdomen is soft. Is no peripheral edema. There no musculoskeletal deformities. There are no skin rashes.    Filed Vitals:     07/24/12 1357   BP:  141/98   Pulse:  53   Weight:  197 lb (89.359  kg)    EKG is done today and reviewed by me. He has old right bundle branch block. He has atrial fibrillation with a controlled rate.   ASSESSMENT & PLAN        Coronary artery disease - [name] Rough, MD  07/24/2012  4:50 PM  Signed Historically the patient has minimal coronary disease. No further workup.  Aortic stenosis - [name] Rough, MD  07/24/2012  4:51 PM  Signed He has mild aortic stenosis and mild aortic insufficiency by echo in July, 2011. He will need a followup echo at some point but not yet.  Incomplete right bundle branch block - [name] Rough, MD  07/24/2012  4:51 PM  Signed Today he has right bundle branch block.  Ejection fraction - [name] Rough, MD  07/24/2012  4:51 PM  Signed There is normal LV function.  Renal insufficiency - [name] Rough, MD  07/24/2012  4:52 PM  Signed Patient has mild renal insufficiency by history.  Painless hematuria  - [name] Rough, MD  07/24/2012  4:52 PM  Signed The patient has intermittently had painless hematuria. He has been assessed very carefully by urology. Most recently he reduced his anticoagulant dose to 75 mg once daily. When his atrial fib returned he increased it back to pradaxa 75 twice a day.  Atrial fibrillation - [name] Rough, MD  07/24/2012  4:56 PM  Signed The patient has return of atrial fibrillation 10 days ago. At that time he increased his Multaq back up to 400 by mouth twice a day and his Pradaxa back up to 75 twice a day. He is in atrial fibrillation today. He asked me to cardiovert him today. I made it very clear to him that this could not be done. I also explained to him that I would prefer for him to be on full dose anticoagulation for 21 days. The patient is insistent that he is fully anticoagulated and has been. 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 He insists that we proceed with cardioversion without waiting longer for his anticoagulation.  I have agreed to proceed on September 11 with cardioversion. This will be in the setting of 12 of Pradaxa 75 mg twice a day. As outlined she had previously been on once daily. He made it clear to me that he would sign any type of release concerning this approach. I will show him this note on that day and asked him to sign. Overall I suspect that he will be safe.   Jerral Bonito, MD

## 2012-07-24 NOTE — Assessment & Plan Note (Signed)
Historically the patient has minimal coronary disease. No further workup.

## 2012-07-24 NOTE — Progress Notes (Signed)
HPI   The patient is seen today to followup atrial fibrillation. I spent an extensive amount of time with him today reviewing the history of his atrial fibrillation and discussing his medications. Historically the patient developed atrial fibrillation in 2011. He had a TEE cardioversion but reverted to atrial fib within a week. He was then loaded with  Mulltaq. He chose this  medication as he did not want amiodarone. He converted rapidly to sinus rhythm. He has been treated with pradaxa. It was appropriate to use 75 mg twice daily in combination with Multaq. He had some diarrhea and ultimately lowered the Multaq dose to 400 mg in the morning and 200 mg in the evening. The patient has done relatively well. He then developed some painless hematuria. He has had some urology studies. Ultimately he cut his pradaxa to 75 mg daily. 10 days ago he developed recurrent atrial fibrillation. He feels well at rest. However he has marked fatigue with any exercise. He increased his Multaq to 400 twice a day And he increased his pradaxa to 75 mg twice a day. He continues in atrial fibrillation.  Allergies  Allergen Reactions  . Guaifenesin & Derivatives   . Penicillins     Current Outpatient Prescriptions  Medication Sig Dispense Refill  . aspirin 81 MG tablet Take 81 mg by mouth daily.       Marland Kitchen atorvastatin (LIPITOR) 10 MG tablet take 1 tablet by mouth once daily  30 tablet  9  . dabigatran (PRADAXA) 75 MG CAPS Take 1 capsule (75 mg total) by mouth every 12 (twelve) hours.  60 capsule  6  . dronedarone (MULTAQ) 400 MG tablet Take 400 mg by mouth 2 (two) times daily with a meal.      . metoprolol tartrate (LOPRESSOR) 25 MG tablet Take 25 mg by mouth 2 (two) times daily.       . naproxen sodium (ANAPROX) 220 MG tablet Take 220 mg by mouth as needed.        Marland Kitchen omeprazole (PRILOSEC) 20 MG capsule Take 20 mg by mouth. Take 1 tablet Monday, Wednesday, Friday, Saturday      . DISCONTD: dronedarone (MULTAQ) 400 MG  tablet Take 1.5 tablets (600 mg total) by mouth daily.  60 tablet  6    History   Social History  . Marital Status: Widowed    Spouse Name: N/A    Number of Children: N/A  . Years of Education: N/A   Occupational History  . Not on file.   Social History Main Topics  . Smoking status: Former Games developer  . Smokeless tobacco: Not on file  . Alcohol Use: Yes  . Drug Use: No  . Sexually Active:    Other Topics Concern  . Not on file   Social History Narrative  . No narrative on file    No family history on file.  Past Medical History  Diagnosis Date  . Coronary artery disease     minimal, cath 2005 / catheterization August, 2011, 30% in 3 vessels, normal LV function  . Hyperlipidemia   . Atrial fibrillation 2011    Multaq Started September, 2011, dose adjusted October, 2011, 200 mg a.m., 400 mg p.o.  . Monoclonal gammopathy     granfortuna  . TIA (transient ischemic attack)     Dr. Sandria Manly,, question in the past. when off ASA for 10 days  . GERD (gastroesophageal reflux disease)     Esophageal dilatation in 1992, some esophageal spasm  .  S/P transurethral resection of prostate 2004    2004  . MR (mitral regurgitation) 2009    mild, Echo, July, 2011  . Cervical spine disease   . Renal insufficiency     Prior creatinine 1.2 /  September, 2011.. 1.7  /  October, 2011.. 1.8  . Ejection fraction     EF 60%, echo, 2009  /  TEE normal August, 2011 /   EF 60%, echo, July, 2011  . Drug therapy     Pradaxa Started July, 2011  . Aortic stenosis     Mild, echo, July, 2011  . Aortic insufficiency     Mild, echo, July, 2011  . Carotid artery disease     Doppler, 2008 no significant abnormality  . Diarrhea     Chronic  . Incomplete right bundle branch block     August, 2012  . Prostate cancer 11/22/2011  . Painless hematuria 03/27/2012    Occurred x 2 5/12 & 5/13 on Pradaxa 75 mg BID    Past Surgical History  Procedure Date  . History of turp   . Hernia repair   . Fibular  fracture repair   . Cataract extraction     ROS   Patient denies fever, chills, headache, sweats, rash, change in vision, change in hearing, chest pain, cough, nausea vomiting, urinary symptoms. All other systems are reviewed and are negative.  PHYSICAL EXAM  Patient is oriented to person time and place. Affect is normal. There is no jugulovenous distention. Lungs are clear. Respiratory effort is nonlabored. Cardiac exam reveals S1 and S2. There are no clicks. He does have a soft systolic murmur consistent with his mild aortic stenosis. The abdomen is soft. Is no peripheral edema. There no musculoskeletal deformities. There are no skin rashes.  Filed Vitals:   07/24/12 1357  BP: 141/98  Pulse: 53  Weight: 197 lb (89.359 kg)   EKG is done today and reviewed by me. He has old right bundle branch block. He has atrial fibrillation with a controlled rate.  ASSESSMENT & PLAN

## 2012-07-24 NOTE — Assessment & Plan Note (Signed)
There is normal LV function.

## 2012-07-24 NOTE — Assessment & Plan Note (Signed)
The patient has intermittently had painless hematuria. He has been assessed very carefully by urology. Most recently he reduced his anticoagulant dose to 75 mg once daily. When his atrial fib returned he increased it back to pradaxa 75 twice a day.

## 2012-07-24 NOTE — Patient Instructions (Addendum)
Your physician recommends that you return for lab work in: today (pt/inr, bmet, cbc w/diff)  A chest x-ray takes a picture of the organs and structures inside the chest, including the heart, lungs, and blood vessels. This test can show several things, including, whether the heart is enlarges; whether fluid is building up in the lungs; and whether pacemaker / defibrillator leads are still in place.   You have been scheduled for a cardioversion on 07/26/12.  Please refer to the letter you were given for your instructions

## 2012-07-24 NOTE — Assessment & Plan Note (Signed)
Patient has mild renal insufficiency by history.

## 2012-07-25 ENCOUNTER — Telehealth: Payer: Self-pay

## 2012-07-25 NOTE — Telephone Encounter (Signed)
Pt. advised that cardioversion has been changed to 9/17 @ 10 am, he verbalized understanding./LV

## 2012-07-25 NOTE — Telephone Encounter (Signed)
Due to scheduling, pt. Is advised that cardioversion has be re scheduled to 9/17 @ 2 pm, he verbalized understanding./LV

## 2012-08-01 ENCOUNTER — Encounter (HOSPITAL_COMMUNITY): Payer: Self-pay | Admitting: Anesthesiology

## 2012-08-01 ENCOUNTER — Ambulatory Visit (HOSPITAL_COMMUNITY): Payer: Medicare Other | Admitting: Anesthesiology

## 2012-08-01 ENCOUNTER — Ambulatory Visit (HOSPITAL_COMMUNITY)
Admission: RE | Admit: 2012-08-01 | Discharge: 2012-08-01 | Disposition: A | Discharge status: Home / Self Care | Payer: Medicare Other | Source: Ambulatory Visit | Attending: Cardiology | Admitting: Cardiology

## 2012-08-01 ENCOUNTER — Encounter (HOSPITAL_COMMUNITY)
Admission: RE | Disposition: A | Discharge status: Home / Self Care | Payer: Self-pay | Source: Ambulatory Visit | Attending: Cardiology

## 2012-08-01 ENCOUNTER — Encounter (HOSPITAL_COMMUNITY): Payer: Self-pay

## 2012-08-01 DIAGNOSIS — I4891 Unspecified atrial fibrillation: Secondary | ICD-10-CM | POA: Insufficient documentation

## 2012-08-01 HISTORY — PX: CARDIOVERSION: SHX1299

## 2012-08-01 SURGERY — CARDIOVERSION
Anesthesia: Monitor Anesthesia Care | Wound class: Clean

## 2012-08-01 MED ORDER — SODIUM CHLORIDE 0.9 % IV SOLN
INTRAVENOUS | Status: DC
  Administered 2012-08-01: 14:00:00 via INTRAVENOUS

## 2012-08-01 MED ORDER — PROPOFOL 10 MG/ML IV BOLUS
INTRAVENOUS | Status: DC | PRN
  Administered 2012-08-01: 80 mg via INTRAVENOUS

## 2012-08-01 MED ORDER — LIDOCAINE HCL (CARDIAC) 20 MG/ML IV SOLN
INTRAVENOUS | Status: DC | PRN
  Administered 2012-08-01: 80 mg via INTRAVENOUS

## 2012-08-01 NOTE — Transfer of Care (Signed)
Immediate Anesthesia Transfer of Care Note  Patient: [name] [name]  Procedure(s) Performed: Procedure(s) (LRB) with comments: CARDIOVERSION (N/A)  Patient Location: PACU and Short Stay  Anesthesia Type: MAC  Level of Consciousness: awake, alert  and oriented  Airway & Oxygen Therapy: Patient Spontanous Breathing and Patient connected to nasal cannula oxygen  Post-op Assessment: Report given to PACU RN and Post -op Vital signs reviewed and stable  Post vital signs: Reviewed  Complications: No apparent anesthesia complications

## 2012-08-01 NOTE — Progress Notes (Signed)
Patient ID: [name] [name], male   DOB: 1925/08/26, 78 y.o.   MRN: [id number]   All of the information about this patient is outlined in the history and physical already present in this record. The patient is stable today. He is on all the appropriate medications. He is ready to proceed with cardioversion.  Jerral Bonito, MD

## 2012-08-01 NOTE — CV Procedure (Signed)
   The patient was prepared carefully for cardioversion. He has been on full dose Pradaxa for 21 days. He's been on 400 mg of Multaq twice daily for this period of time. I spoke with his daughter and the patient before the procedure. A time out was done. The patient answered all the questions correctly concerning his identity in the purpose of the procedure today. Appropriate consent was also obtained.  Anesthesia was present. The patient received 80 mg of IV propofol.  Anterior posterior pads were in place. The patient received 120 J of biphasic energy. One shock was done. He immediately converted to sinus rhythm. He is holding sinus rhythm at this time.  There are no complications.  The patient will be allowed to go home after he is fully awake. He is to remain on his same medications. I will see him back in the office in followup.  I spoke to the family after the procedure.

## 2012-08-01 NOTE — Anesthesia Preprocedure Evaluation (Signed)
Anesthesia Evaluation  Patient identified by MRN, date of birth, ID band Patient awake    Reviewed: Allergy & Precautions, H&P , NPO status , Patient's Chart, lab work & pertinent test results, reviewed documented beta blocker date and time   Airway Mallampati: II      Dental  (+) Teeth Intact and Dental Advisory Given   Pulmonary          Cardiovascular + CAD + dysrhythmias Atrial Fibrillation     Neuro/Psych TIA   GI/Hepatic GERD-  Medicated,  Endo/Other    Renal/GU Renal InsufficiencyRenal disease     Musculoskeletal   Abdominal   Peds  Hematology   Anesthesia Other Findings   Reproductive/Obstetrics                           Anesthesia Physical Anesthesia Plan  ASA: III  Anesthesia Plan: MAC   Post-op Pain Management:    Induction:   Airway Management Planned: Mask  Additional Equipment:   Intra-op Plan:   Post-operative Plan:   Informed Consent: I have reviewed the patients History and Physical, chart, labs and discussed the procedure including the risks, benefits and alternatives for the proposed anesthesia with the patient or authorized representative who has indicated his/her understanding and acceptance.   Dental advisory given  Plan Discussed with: Anesthesiologist  Anesthesia Plan Comments:         Anesthesia Quick Evaluation

## 2012-08-01 NOTE — Preoperative (Signed)
Beta Blockers   Reason not to administer Beta Blockers:Not Applicable. Lopressor 25 mg 08/01/12 am

## 2012-08-02 ENCOUNTER — Other Ambulatory Visit: Payer: Self-pay

## 2012-08-02 ENCOUNTER — Encounter (HOSPITAL_COMMUNITY): Payer: Self-pay | Admitting: Cardiology

## 2012-08-02 MED ORDER — DRONEDARONE HCL 400 MG PO TABS: 400.0000 mg | ORAL_TABLET | Freq: Two times a day (BID) | ORAL | Status: DC

## 2012-08-04 NOTE — Anesthesia Postprocedure Evaluation (Signed)
Anesthesia Post Note  Patient: [name] [name]  Procedure(s) Performed: Procedure(s) (LRB): CARDIOVERSION (N/A)  Anesthesia type: MAC  Patient location: PACU  Post pain: Pain level controlled and Adequate analgesia  Post assessment: Post-op Vital signs reviewed, Patient's Cardiovascular Status Stable and Respiratory Function Stable  Last Vitals:  Filed Vitals:   08/01/12 1510  BP: 131/77  Pulse:   Temp:   Resp: 12    Post vital signs: Reviewed and stable  Level of consciousness: awake, alert  and oriented  Complications: No apparent anesthesia complications

## 2012-08-10 ENCOUNTER — Telehealth: Payer: Self-pay

## 2012-08-10 NOTE — Telephone Encounter (Signed)
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  He is having extreme weakness combined with malaise since he started the mutaq, worse since increasing to the full dose of multaq after having the burst of afib.  He is also thinking he is having  He also feels like his head is about to burst when he bends over.  He reduced his dose of multaq, (thinking  which helped a little but not a lot.  He wants to discuss another medication.

## 2012-08-15 ENCOUNTER — Encounter: Payer: Self-pay | Admitting: Cardiology

## 2012-08-15 NOTE — Progress Notes (Signed)
   The patient was cardioverted recently. He is holding sinus rhythm. However we had to use 400 mg by mouth twice a day of Multaq. The patient called to tell me that once again at this dose he has a sensation of asthenia. He feels very weak. Yet when he cut the dose down he feels okay. For now he will remain on 200 mg in the morning and 400 mg at night. He does not think his metoprolol is causing this problem. I offered to lower the dose to 12.5 twice a day and he chose remain on 25 by mouth twice a day.  I will plan to see him back in the office. Also I will make an appointment for him to see Dr. Johney Frame. The patient has requested that he be considered for possible atrial fibrillation ablation. He is 78 years of age. I am aware that we need to be very careful in considering this procedure in a patient this age. The patient is very bright and wants to consider all of his options.  Jerral Bonito, MD

## 2012-08-17 ENCOUNTER — Encounter: Payer: Self-pay | Admitting: Cardiology

## 2012-08-17 ENCOUNTER — Ambulatory Visit (INDEPENDENT_AMBULATORY_CARE_PROVIDER_SITE_OTHER): Payer: Medicare Other | Admitting: Cardiology

## 2012-08-17 VITALS — BP 114/72 | HR 60 | Ht 69.0 in | Wt 195.0 lb

## 2012-08-17 DIAGNOSIS — I359 Nonrheumatic aortic valve disorder, unspecified: Secondary | ICD-10-CM

## 2012-08-17 DIAGNOSIS — I2510 Atherosclerotic heart disease of native coronary artery without angina pectoris: Secondary | ICD-10-CM

## 2012-08-17 DIAGNOSIS — I059 Rheumatic mitral valve disease, unspecified: Secondary | ICD-10-CM

## 2012-08-17 DIAGNOSIS — R001 Bradycardia, unspecified: Secondary | ICD-10-CM | POA: Insufficient documentation

## 2012-08-17 DIAGNOSIS — I498 Other specified cardiac arrhythmias: Secondary | ICD-10-CM

## 2012-08-17 DIAGNOSIS — Z79899 Other long term (current) drug therapy: Secondary | ICD-10-CM

## 2012-08-17 DIAGNOSIS — I350 Nonrheumatic aortic (valve) stenosis: Secondary | ICD-10-CM

## 2012-08-17 DIAGNOSIS — C61 Malignant neoplasm of prostate: Secondary | ICD-10-CM

## 2012-08-17 DIAGNOSIS — I340 Nonrheumatic mitral (valve) insufficiency: Secondary | ICD-10-CM

## 2012-08-17 DIAGNOSIS — D472 Monoclonal gammopathy: Secondary | ICD-10-CM

## 2012-08-17 DIAGNOSIS — I4891 Unspecified atrial fibrillation: Secondary | ICD-10-CM

## 2012-08-17 MED ORDER — METOPROLOL TARTRATE 25 MG PO TABS: 12.5000 mg | ORAL_TABLET | Freq: Two times a day (BID) | ORAL | Status: DC

## 2012-08-17 NOTE — Assessment & Plan Note (Signed)
He is on per pradaxa. I am hopeful that he will not start to have hematuria again.

## 2012-08-17 NOTE — Assessment & Plan Note (Signed)
There is minimal coronary disease in 2011. No further workup.

## 2012-08-17 NOTE — Progress Notes (Signed)
HPI   The patient is seen to followup atrial fibrillation. Since seeing the patient last in the office on July 24, 2012, He underwent successful cardioversion while on Multaq 400 mg by mouth twice a day and full dose per pradaxa. He converted with the first shock. After this time he called to say that once again he had asthenia related to the full dose of the drug. It was decreased to 200 mg in the morning and 400 in the evening and he feels better. He does have some decreased stamina. He is back today for followup. Also since the last visit I have discussed his case on multiple occasions with Dr. Johney Frame. The patient has requested consideration of atrial fibrillation. I told him I thought it was unlikely that this would be recommended but that we would consider all options.  Allergies  Allergen Reactions  . Guaifenesin & Derivatives   . Penicillins     Current Outpatient Prescriptions  Medication Sig Dispense Refill  . aspirin 81 MG tablet Take 81 mg by mouth 3 (three) times a week.       Marland Kitchen atorvastatin (LIPITOR) 10 MG tablet take 1 tablet by mouth once daily  30 tablet  9  . dabigatran (PRADAXA) 75 MG CAPS Take 1 capsule (75 mg total) by mouth every 12 (twelve) hours.  60 capsule  6  . dronedarone (MULTAQ) 400 MG tablet Take 200 mg in the AM and 400 mg in the PM      . metoprolol tartrate (LOPRESSOR) 25 MG tablet Take 25 mg by mouth 2 (two) times daily.       . naproxen sodium (ANAPROX) 220 MG tablet Take 220 mg by mouth as needed.       Marland Kitchen omeprazole (PRILOSEC) 20 MG capsule Take 20 mg by mouth. Take 1 tablet Monday, Wednesday, Friday, Saturday      . DISCONTD: dronedarone (MULTAQ) 400 MG tablet Take 1 tablet (400 mg total) by mouth 2 (two) times daily with a meal.  60 tablet  11    History   Social History  . Marital Status: Widowed    Spouse Name: N/A    Number of Children: N/A  . Years of Education: N/A   Occupational History  . Not on file.   Social History Main Topics    . Smoking status: Former Games developer  . Smokeless tobacco: Former Neurosurgeon    Quit date: 08/01/1997  . Alcohol Use: Yes  . Drug Use: No  . Sexually Active:    Other Topics Concern  . Not on file   Social History Narrative  . No narrative on file    No family history on file.  Past Medical History  Diagnosis Date  . Coronary artery disease     minimal, cath 2005 / catheterization August, 2011, 30% in 3 vessels, normal LV function  . Hyperlipidemia   . Atrial fibrillation 2011    Multaq Started September, 2011, dose adjusted October, 2011, 200 mg a.m., 400 mg p.o.  . Monoclonal gammopathy     granfortuna  . TIA (transient ischemic attack)     Dr. Sandria Manly,, question in the past. when off ASA for 10 days  . GERD (gastroesophageal reflux disease)     Esophageal dilatation in 1992, some esophageal spasm  . S/P transurethral resection of prostate 2004    2004  . MR (mitral regurgitation) 2009    mild, Echo, July, 2011  . Cervical spine disease   . Renal insufficiency  Prior creatinine 1.2 /  September, 2011.. 1.7  /  October, 2011.. 1.8  . Ejection fraction     EF 60%, echo, 2009  /  TEE normal August, 2011 /   EF 60%, echo, July, 2011  . Drug therapy     Pradaxa Started July, 2011  . Aortic stenosis     Mild, echo, July, 2011  . Aortic insufficiency     Mild, echo, July, 2011  . Carotid artery disease     Doppler, 2008 no significant abnormality  . Diarrhea     Chronic  . Incomplete right bundle branch block     August, 2012  . Prostate cancer 11/22/2011  . Painless hematuria 03/27/2012    Occurred x 2 5/12 & 5/13 on Pradaxa 75 mg BID    Past Surgical History  Procedure Date  . History of turp   . Hernia repair   . Cataract extraction   . Fibular fracture repair   . Cardioversion 08/01/2012    Procedure: CARDIOVERSION;  Surgeon: Luis Abed, MD;  Location: [facility] ENDOSCOPY;  Service: Cardiovascular;  Laterality: N/A;    ROS   Patient denies fever, chills, headache,  sweats, rash, change in vision, change in hearing, chest pain, cough, nausea vomiting, urinary symptoms. All other systems are reviewed and are negative other than the history of present illness.  PHYSICAL EXAM   Patient is oriented to person time and place. Affect is normal. There is no jugulovenous distention. There no carotid bruits. Lungs are clear. Respiratory effort is nonlabored. Cardiac exam reveals S1 and S2. There is a 3/6 crescendo decrescendo systolic murmur consistent with aortic valvular disease. The abdomen is soft. There is no peripheral edema. There no musculoskeletal deformities. There are no skin rashes.  Filed Vitals:   08/17/12 1349  BP: 114/72  Pulse: 60  Height: 5\' 9"  (1.753 m)  Weight: 195 lb (88.451 kg)    EKG is done today and reviewed by me. He is in sinus rhythm. He does have sinus bradycardia at a rate of 50.  ASSESSMENT & PLAN

## 2012-08-17 NOTE — Assessment & Plan Note (Signed)
The patient today has sinus bradycardia with a rate of 50. We'll reduce his beta blocker from 25 twice a day to 12.5 twice a day. It is possible that this rate a Cardiolite limit our ability to use amiodarone if its effect on heart rate is different from Multaq.

## 2012-08-17 NOTE — Assessment & Plan Note (Signed)
The patient is relatively stable. EKG is done today and reviewed by me. He has sinus rhythm but there is sinus bradycardia. He will be seeing Dr. Johney Frame for consultation September 04, 2012. I did talk with the patient today about the possibility of using amiodarone. For the first time he seems willing to consider this if necessary. It would be nice to have a plan in place so that if and when he converts atrial fib again in the near future, we can move on to Plan B

## 2012-08-17 NOTE — Assessment & Plan Note (Signed)
There is mild mitral regurgitation by TEE in 2011. He needs a followup echo now for other reasons.

## 2012-08-17 NOTE — Assessment & Plan Note (Signed)
Aortic stenosis was very mild by echo 2011. His murmur seems louder. I doubt severe aortic stenosis. We will proceed with a 2-D echo to reassess LV function and mitral regurgitation and aortic valvular disease.

## 2012-08-17 NOTE — Patient Instructions (Addendum)
Your physician has recommended you make the following change in your medication:  1.) DECREASE METOPROLOL (12.5 MG ) TWICE A DAY   Your physician has requested that you have an echocardiogram. DX:  424.1, MITRAL REGIRG  Echocardiography is a painless test that uses sound waves to create images of your heart. It provides your doctor with information about the size and shape of your heart and how well your heart's chambers and valves are working. This procedure takes approximately one hour. There are no restrictions for this procedure.   Your physician recommends that you schedule a follow-up appointment ON October 21 AT 12:00 PM WITH DR. ALLRED FOR A NEW PATIENT VISIT THAT I ALREADY MADE AND WE DISCUSSED  Your physician recommends that you schedule a follow-up appointment WITH DR. KATZ IN 6 WEEKS

## 2012-08-23 ENCOUNTER — Ambulatory Visit (HOSPITAL_COMMUNITY): Payer: Medicare Other | Attending: Cardiovascular Disease | Admitting: Radiology

## 2012-08-23 ENCOUNTER — Encounter: Payer: Self-pay | Admitting: Cardiology

## 2012-08-23 DIAGNOSIS — I359 Nonrheumatic aortic valve disorder, unspecified: Secondary | ICD-10-CM

## 2012-08-23 DIAGNOSIS — I2510 Atherosclerotic heart disease of native coronary artery without angina pectoris: Secondary | ICD-10-CM | POA: Insufficient documentation

## 2012-08-23 DIAGNOSIS — I4891 Unspecified atrial fibrillation: Secondary | ICD-10-CM

## 2012-08-23 DIAGNOSIS — R001 Bradycardia, unspecified: Secondary | ICD-10-CM

## 2012-08-23 DIAGNOSIS — I369 Nonrheumatic tricuspid valve disorder, unspecified: Secondary | ICD-10-CM | POA: Insufficient documentation

## 2012-08-23 DIAGNOSIS — I340 Nonrheumatic mitral (valve) insufficiency: Secondary | ICD-10-CM

## 2012-08-23 DIAGNOSIS — I350 Nonrheumatic aortic (valve) stenosis: Secondary | ICD-10-CM

## 2012-08-23 DIAGNOSIS — I080 Rheumatic disorders of both mitral and aortic valves: Secondary | ICD-10-CM | POA: Insufficient documentation

## 2012-08-23 DIAGNOSIS — Z79899 Other long term (current) drug therapy: Secondary | ICD-10-CM

## 2012-08-23 DIAGNOSIS — I379 Nonrheumatic pulmonary valve disorder, unspecified: Secondary | ICD-10-CM | POA: Insufficient documentation

## 2012-08-23 NOTE — Progress Notes (Signed)
Echocardiogram performed.  

## 2012-09-04 ENCOUNTER — Ambulatory Visit (INDEPENDENT_AMBULATORY_CARE_PROVIDER_SITE_OTHER): Payer: Medicare Other | Admitting: Internal Medicine

## 2012-09-04 ENCOUNTER — Encounter: Payer: Self-pay | Admitting: Internal Medicine

## 2012-09-04 VITALS — BP 122/74 | HR 56 | Ht 69.0 in | Wt 198.0 lb

## 2012-09-04 DIAGNOSIS — I4891 Unspecified atrial fibrillation: Secondary | ICD-10-CM

## 2012-09-04 NOTE — Assessment & Plan Note (Signed)
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  He has done rather well, with only 3 episodes of symptomatic afib in 3 years.  He takes multaq 200mg  qam and 400mg  qpm with reasonable afib control.  He is anticoagulated with pradaxa.  His CrCl is about 45.  As recommended by [facility], in the presence of multaq, his pradaxa dose has been reduced to 75mg  BID.  I have encouraged him today strongly to stop ASA which I think increases bleeding risks without identifiable benefit.  He is reluctant to do so.  He says "I have taken aspirin for over 30 years". I think that he would be well served to be followed in our anticoagulation clinic by [facility].  I did not however make this referral today.  I will defer this decision to Dr Myrtis Ser.  Therapeutic strategies for afib including medicine and ablation were discussed in detail with the patient today. Risk, benefits, and alternatives to EP study and radiofrequency ablation for afib were also discussed in detail today.  Given his advanced age, my recommendation is to avoid ablation (and other invasive procedures) if possible. If he has further afib, then I think that either tikosyn or amiodarone would be reasonable.  I would favor amiodarone long term prior to consideration of ablation.  The patient understands.  He wishes to continue his current regimen at this time, which I think is just fine.  He should have liver profile twice per year. I have therefore made no changes today. He will continue regular follow-up with Dr Myrtis Ser and I will see as needed going forward.

## 2012-09-04 NOTE — Patient Instructions (Signed)
Your physician recommends that you schedule a follow-up appointment as needed  

## 2012-09-04 NOTE — Progress Notes (Signed)
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  He reports initially being diagnosed with atrial fibrillation July 2011 after presenting with symptoms of fatigue.  He checked his pulse and noted that it was quite irregular at the time.  He was admitted to [facility] and was initiated on pradaxa and multaq.  He underwent TEE guided cardioversion at that time.  He did well but reports feeling "washed out" on 400mg  BID of multaq.  He has since decreased multaq to 200mg  qam and 400mg  qpm.  With this regimen, he has done rather well.  He reports having one episode of afib in 2012 while playing golf which self terminated in several hours.  He did well until September 2013 when he again returned to afib.  He again required cardioversion.  He has maintained sinus rhythm since that time.  Today, he denies symptoms of palpitations, chest pain, shortness of breath, orthopnea, PND, lower extremity edema, dizziness, presyncope, syncope, or neurologic sequela. The patient is tolerating medications without difficulties and is otherwise without complaint today.   Past Medical History  Diagnosis Date  . Coronary artery disease     minimal, cath 2005 / catheterization August, 2011, 30% in 3 vessels, normal LV function  . Hyperlipidemia   . Atrial fibrillation 2011    Multaq Started September, 2011, dose adjusted October, 2011, 200 mg a.m., 400 mg p.o.  . Monoclonal gammopathy     granfortuna  . TIA (transient ischemic attack)     Dr. Sandria Manly,, question in the past. when off ASA for 10 days  . GERD (gastroesophageal reflux disease)     Esophageal dilatation in 1992, some esophageal spasm  . S/P transurethral resection of prostate 2004    2004  . MR (mitral regurgitation) 2009    mild, Echo, July, 2011  . Cervical spine disease   . Renal insufficiency     Prior  creatinine 1.2 /  September, 2011.. 1.7  /  October, 2011.. 1.8  . Ejection fraction     EF 60%, echo, 2009  /  TEE normal August, 2011 /   EF 60%, echo, July, 2011  . Drug therapy     Pradaxa Started July, 2011  . Aortic stenosis     Mild, echo, July, 2011  . Aortic insufficiency     Mild, echo, July, 2011  . Carotid artery disease     Doppler, 2008 no significant abnormality  . Diarrhea     Chronic  . Incomplete right bundle branch block     August, 2012  . Prostate cancer 11/22/2011  . Painless hematuria 03/27/2012    Occurred x 2 5/12 & 5/13 on Pradaxa 75 mg BID  . Bradycardia     Sinus bradycardia while on 25 Lopressor twice a day October, 2013   Past Surgical History  Procedure Date  . History of turp   . Hernia repair   . Cataract extraction   . Fibular fracture repair   . Cardioversion 08/01/2012    Procedure: CARDIOVERSION;  Surgeon: Luis Abed, MD;  Location: [facility] ENDOSCOPY;  Service: Cardiovascular;  Laterality: N/A;    Current Outpatient Prescriptions  Medication Sig Dispense Refill  . aspirin 81 MG tablet Take 81 mg by mouth 3 (three) times a week.       Marland Kitchen atorvastatin (LIPITOR) 10 MG tablet take 1  tablet by mouth once daily  30 tablet  9  . dabigatran (PRADAXA) 75 MG CAPS Take 1 capsule (75 mg total) by mouth every 12 (twelve) hours.  60 capsule  6  . dronedarone (MULTAQ) 400 MG tablet Take 200 mg in the AM and 400 mg in the PM      . metoprolol tartrate (LOPRESSOR) 25 MG tablet Take 0.5 tablets (12.5 mg total) by mouth 2 (two) times daily.  30 tablet  11  . naproxen sodium (ANAPROX) 220 MG tablet Take 220 mg by mouth as needed.       Marland Kitchen omeprazole (PRILOSEC) 20 MG capsule Take 20 mg by mouth. Take 1 tablet Monday, Wednesday, Friday, Saturday        Allergies  Allergen Reactions  . Guaifenesin & Derivatives   . Penicillins     History   Social History  . Marital Status: Widowed    Spouse Name: N/A    Number of Children: N/A  . Years of Education: N/A     Occupational History  . Not on file.   Social History Main Topics  . Smoking status: Former Games developer  . Smokeless tobacco: Former Neurosurgeon    Quit date: 08/01/1997  . Alcohol Use: Yes  . Drug Use: No  . Sexually Active:    Other Topics Concern  . Not on file   Social History Narrative  . No narrative on file   Physical Exam: Filed Vitals:   09/04/12 1157  BP: 122/74  Pulse: 56  Height: 5\' 9"  (1.753 m)  Weight: 198 lb (89.812 kg)  SpO2: 98%    GEN- The patient is well but older appearing, alert and oriented x 3 today.   Head- normocephalic, atraumatic Eyes-  Sclera clear, conjunctiva pink Ears- hearing intact Oropharynx- clear Neck- supple  Lungs- Clear to ausculation bilaterally, normal work of breathing Heart- Regular rate and rhythm,2/6 SEM LUSB (mid peaking) GI- soft, NT, ND, + BS Extremities- no clubbing, cyanosis, or edema MS- age appropriate muscle atrophy Skin- no rash or lesion Psych- euthymic mood, full affect Neuro- strength and sensation are intact  EKG today reveals sinus rhythm 56 bpm, PR 120 msec, RBBB  Assessment and Plan:

## 2012-09-18 ENCOUNTER — Other Ambulatory Visit (HOSPITAL_BASED_OUTPATIENT_CLINIC_OR_DEPARTMENT_OTHER): Payer: Medicare Other | Admitting: Lab

## 2012-09-18 DIAGNOSIS — D472 Monoclonal gammopathy: Secondary | ICD-10-CM

## 2012-09-18 DIAGNOSIS — C61 Malignant neoplasm of prostate: Secondary | ICD-10-CM

## 2012-09-18 LAB — CBC WITH DIFFERENTIAL/PLATELET
BASO%: 0.7 % (ref 0.0–2.0)
LYMPH%: 34.8 % (ref 14.0–49.0)
MCHC: 33.7 g/dL (ref 32.0–36.0)
MONO#: 0.7 10*3/uL (ref 0.1–0.9)
Platelets: 243 10*3/uL (ref 140–400)
RBC: 3.74 10*6/uL — ABNORMAL LOW (ref 4.20–5.82)
RDW: 13.9 % (ref 11.0–14.6)
WBC: 8.5 10*3/uL (ref 4.0–10.3)
lymph#: 3 10*3/uL (ref 0.9–3.3)

## 2012-09-18 LAB — COMPREHENSIVE METABOLIC PANEL (CC13)
ALT: 21 U/L (ref 0–55)
AST: 21 U/L (ref 5–34)
Albumin: 3.7 g/dL (ref 3.5–5.0)
Alkaline Phosphatase: 46 U/L (ref 40–150)
BUN: 17 mg/dL (ref 7.0–26.0)
CO2: 24 mEq/L (ref 22–29)
Calcium: 9.4 mg/dL (ref 8.4–10.4)
Chloride: 109 mEq/L — ABNORMAL HIGH (ref 98–107)
Creatinine: 1.5 mg/dL — ABNORMAL HIGH (ref 0.7–1.3)
Glucose: 98 mg/dl (ref 70–99)
Potassium: 4.6 mEq/L (ref 3.5–5.1)
Sodium: 139 mEq/L (ref 136–145)
Total Bilirubin: 0.9 mg/dL (ref 0.20–1.20)
Total Protein: 7.9 g/dL (ref 6.4–8.3)

## 2012-09-18 LAB — MORPHOLOGY

## 2012-09-19 LAB — KAPPA/LAMBDA LIGHT CHAINS: Lambda Free Lght Chn: 1.28 mg/dL (ref 0.57–2.63)

## 2012-09-19 LAB — PSA: PSA: 3.07 ng/mL (ref <=4.00)

## 2012-09-25 ENCOUNTER — Ambulatory Visit (HOSPITAL_BASED_OUTPATIENT_CLINIC_OR_DEPARTMENT_OTHER): Payer: Medicare Other | Admitting: Oncology

## 2012-09-25 ENCOUNTER — Telehealth: Payer: Self-pay | Admitting: Oncology

## 2012-09-25 VITALS — BP 172/71 | HR 51 | Temp 98.8°F | Resp 20 | Ht 69.0 in | Wt 197.4 lb

## 2012-09-25 DIAGNOSIS — I4891 Unspecified atrial fibrillation: Secondary | ICD-10-CM

## 2012-09-25 DIAGNOSIS — C61 Malignant neoplasm of prostate: Secondary | ICD-10-CM

## 2012-09-25 DIAGNOSIS — D472 Monoclonal gammopathy: Secondary | ICD-10-CM

## 2012-09-25 DIAGNOSIS — R310 Gross hematuria: Secondary | ICD-10-CM

## 2012-09-25 NOTE — Progress Notes (Signed)
Hematology and Oncology Follow Up Visit  [name] [name] [id number] [birth year] 78 y.o. 09/25/2012 11:44 AM   Principle Diagnosis: Encounter Diagnoses  Name Primary?  . Monoclonal gammopathy Yes  . 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 Main active problem relates to his atrial fibrillation. Initial onset in 2011. He underwent cardioversion at that time(06/18/2010). He has been maintained on Multaq 200 mg in the morning 400 mg in the evening as well as a beta blocker. He is anticoagulated with Pradaxa 75 mg twice daily. He had a recent exacerbation and required a second cardioversion on 07/24/2012 and he tolerated this well. He does not tolerate escalation of his Multaq. His beta blocker was adjusted to a current dose of metoprolol 12.5 mg twice daily.  At time of most recent visit here in May of this year he had complained of self-limited onset of gross hematuria. He did see his urologist. I don't have a note from that visit but it was felt that the hematuria was likely due to the Pradaxa.     Medications: reviewed  Allergies:  Allergies  Allergen Reactions  . Guaifenesin & Derivatives   . Penicillins     Review of Systems: Constitutional:   No constitutional symptoms Respiratory: No chest pain or dyspnea at rest. He does get dyspneic on exertion especially when walking up an incline but has not any associated chest pain or palpitations Cardiovascular:  See above Gastrointestinal: No change in bowel habit Genito-Urinary: No urinary tract symptoms. Musculoskeletal: Chronic arthritis pain of his back stable Neurologic: No headache or change in vision. Skin: Recurrent skin lesion left arm likely benign. He just says  dermatologist 2 weeks ago. No concerns raised. Remaining ROS negative.  Physical Exam: Blood pressure 172/71, pulse 51, temperature 98.8 F (37.1 C), temperature source Oral, resp. rate 20, height 5\' 9"  (1.753 m), weight 197 lb 6.4 oz (89.54 kg). Wt Readings from Last 3 Encounters:  09/25/12 197 lb 6.4 oz (89.54 kg)  09/04/12 198 lb (89.812 kg)  08/17/12 195 lb (88.451 kg)     General appearance: Well-nourished Caucasian man HENNT: Pharynx no erythema or exudate Lymph nodes: Persistent 2 x 2 centimeter left axillary lymph node unchanged compare with prior exams Breasts: Lungs: Clear to auscultation resonant to percussion Heart: Regular rhythm, 2/6 aortic systolic murmur Abdomen: Soft, nontender, no mass, no organomegaly, no inguinal adenopathy Extremities: No edema, no calf tenderness Vascular: No cyanosis Neurologic: Decreased hearing, motor strength 5 over 5, reflexes 2+ symmetric, sensation is moderately decreased to vibration over the fingertips of the left hand and mildly decreased over the right hand Skin: Healing left proximal arm skin lesion  Lab Results: Lab Results  Component Value Date   WBC 8.5 09/18/2012   HGB 12.6* 09/18/2012   HCT 37.5* 09/18/2012   MCV 100.3* 09/18/2012   PLT 243 09/18/2012     Chemistry      Component Value Date/Time   NA 139 09/18/2012 1046   NA 139 07/24/2012 1508   K 4.6 09/18/2012 1046   K 4.3 07/24/2012 1508   CL 109* 09/18/2012 1046   CL 107 07/24/2012 1508   CO2 24 09/18/2012 1046   CO2 25 07/24/2012 1508   BUN 17.0 09/18/2012 1046   BUN 19 07/24/2012 1508  CREATININE 1.5* 09/18/2012 1046   CREATININE 1.5 07/24/2012 1508   CREATININE 1.61* 08/26/2010 0955      Component Value Date/Time   CALCIUM 9.4 09/18/2012 1046   CALCIUM 9.0 07/24/2012 1508   ALKPHOS 46 09/18/2012 1046   ALKPHOS 37* 03/20/2012 1049   AST 21 09/18/2012 1046   AST 20 03/20/2012 1049   ALT 21 09/18/2012 1046   ALT 16 03/20/2012 1049   BILITOT 0.90 09/18/2012 1046   BILITOT 0.6  03/20/2012 1049    PSA 3.07 done 09/19/2012 compare with 2.62 on 03/20/2012 and 2.57 on 09/27/2011. IgG 2860 mg percent compare with 2600 mg percent on May 6 and 2170 mg percent on 09/27/2011 Serum Her free light chain 2.64 lambda 1.28 ratio 2.06 borderline elevated processes normal 0.26-1.65) but stable over time   Radiological Studies: Most recent echocardiogram done 08/23/2012 with estimated ejection fraction 60-65%. Mildly dilated left ventricular cavity. Mild left ventricular hypertrophy. Mild aortic stenosis and regurgitation. Moderately thickened and severely calcified leaflets.   Impression and Plan:  #1. IgG kappa monoclonal gammopathy of undetermined significance. Initial diagnosis July 2000.  Trend for progressive slow rise in the total IgG but free light chains remain stable.   Plan: Continue every 6 month laboratory monitoring.   #2. Localized prostate cancer treated with radiofrequency ablation at time of diagnosis in February of 2000. Stable PSA with minor fluctuations.  #3. 2 episodes of self-limited gross hematuria  This may be a result of anticoagulation with Pradaxa.   #4. Single vessel coronary artery disease.   #5. Aortic systolic murmur with mild aortic stenosis and regurgitation on echocardiogram.   #6. Atrial fibrillation diagnosed 2 years ago. Currently stable on Multaq(Dronedarone). 200 mg in the morning 400 mg in the evening. Previous relapse after DC cardioversion. Recent repeat cardioversion with addition of low-dose beta blocker to his medical regimen. Currently on anticoagulation with Pradaxa 75 mg twice a day.   #7. GERD   #8. Hyperlipidemia   #9. Status post excision multiple basal cell carcinomas   #10. Degenerative arthritis of the spine.   #11. Remote history of a transient ischemic attack.     CC:. Dr. Willa Rough; Dr. Jethro Bolus; Dr. Bernette Redbird      [name], MD 11/11/201311:44 AM

## 2012-09-25 NOTE — Patient Instructions (Signed)
Lab in May, 2014 Repeat lab 11/14 MD visit 11/14

## 2012-09-25 NOTE — Telephone Encounter (Signed)
gv pt appt schedule for may and November 2014.

## 2012-10-04 ENCOUNTER — Ambulatory Visit (INDEPENDENT_AMBULATORY_CARE_PROVIDER_SITE_OTHER): Payer: Medicare Other | Admitting: *Deleted

## 2012-10-04 ENCOUNTER — Other Ambulatory Visit: Payer: Self-pay | Admitting: *Deleted

## 2012-10-04 DIAGNOSIS — I4891 Unspecified atrial fibrillation: Secondary | ICD-10-CM

## 2012-10-04 MED ORDER — DABIGATRAN ETEXILATE MESYLATE 75 MG PO CAPS: 75.0000 mg | ORAL_CAPSULE | Freq: Two times a day (BID) | ORAL | Status: DC

## 2012-10-04 NOTE — Patient Instructions (Addendum)
Pt was started on Pradaxa for atrial fib on 07/02/2011.   Reviewed patients medication list.  Pt is not  currently on any combined P-gp and strong CYP3A4 inhibitors/inducers (ketoconazole, traconazole, ritonavir, carbamazepine, phenytoin, rifampin, St. John's wort).  Reviewed labs.  SCr 1.5, Weight 89.54 kg, CrCl-45.6.  Dose Pradaxa 75mg   q 12 hours  based on CrCl.   Hgb  12.6 and HCT 37.5  A full discussion of the nature of anticoagulants has been carried out.  A benefit/risk analysis has been presented to the patient, so that they understand the justification for choosing anticoagulation with Praxada at this time.  The need for compliance is stressed.  Pt is aware to take the medication twice daily.  Side effects of potential bleeding are discussed, including unusual colored urine or stools, coughing up blood or coffee ground emesis, nose bleeds or serious fall or head trauma.  Discussed signs and symptoms of stroke. The patient should avoid any OTC items containing aspirin or ibuprofen.  Avoid alcohol consumption.   Call if any signs of abnormal bleeding.  Discussed financial obligations and resolved any difficulty in obtaining medication.  Asked pt to call MD if any medications change or he is scheduled for a procedure. Next lab test test in 6 months. Pt states has had slight nose bleed at times and has reduced ASA  81 mg to three times a week. Is followed  By Dr Marlena Clipper and will have labs done every 6 months.

## 2012-10-13 ENCOUNTER — Encounter: Payer: Self-pay | Admitting: Cardiology

## 2012-10-16 ENCOUNTER — Other Ambulatory Visit: Payer: Self-pay | Admitting: Internal Medicine

## 2012-10-16 DIAGNOSIS — I350 Nonrheumatic aortic (valve) stenosis: Secondary | ICD-10-CM

## 2012-10-16 DIAGNOSIS — C61 Malignant neoplasm of prostate: Secondary | ICD-10-CM

## 2012-10-16 DIAGNOSIS — I2510 Atherosclerotic heart disease of native coronary artery without angina pectoris: Secondary | ICD-10-CM

## 2012-10-16 DIAGNOSIS — Z79899 Other long term (current) drug therapy: Secondary | ICD-10-CM

## 2012-10-16 DIAGNOSIS — I4891 Unspecified atrial fibrillation: Secondary | ICD-10-CM

## 2012-10-16 DIAGNOSIS — I340 Nonrheumatic mitral (valve) insufficiency: Secondary | ICD-10-CM

## 2012-10-16 DIAGNOSIS — D472 Monoclonal gammopathy: Secondary | ICD-10-CM

## 2012-10-16 DIAGNOSIS — R001 Bradycardia, unspecified: Secondary | ICD-10-CM

## 2012-10-16 MED ORDER — METOPROLOL TARTRATE 25 MG PO TABS: 12.5000 mg | ORAL_TABLET | Freq: Two times a day (BID) | ORAL | Status: DC

## 2012-10-17 ENCOUNTER — Encounter: Payer: Self-pay | Admitting: Cardiology

## 2012-10-17 ENCOUNTER — Ambulatory Visit (INDEPENDENT_AMBULATORY_CARE_PROVIDER_SITE_OTHER): Payer: Medicare Other | Admitting: Cardiology

## 2012-10-17 VITALS — BP 130/70 | HR 60 | Ht 68.0 in | Wt 198.0 lb

## 2012-10-17 DIAGNOSIS — I350 Nonrheumatic aortic (valve) stenosis: Secondary | ICD-10-CM

## 2012-10-17 DIAGNOSIS — I359 Nonrheumatic aortic valve disorder, unspecified: Secondary | ICD-10-CM

## 2012-10-17 DIAGNOSIS — Z79899 Other long term (current) drug therapy: Secondary | ICD-10-CM

## 2012-10-17 DIAGNOSIS — I4891 Unspecified atrial fibrillation: Secondary | ICD-10-CM

## 2012-10-17 NOTE — Assessment & Plan Note (Signed)
The patient is on Pradaxa. It is our preference that he not be on aspirin. He wants to remain on low-dose aspirin. He will be followed on these meds. I will consider referral to the anticoagulation clinic.

## 2012-10-17 NOTE — Progress Notes (Signed)
Patient ID: [name] [name], male   DOB: Mar 11, 1925, 78 y.o.   MRN: [id number]   HPI  The patient is seen today to followup atrial fibrillation. He is doing well. I saw him last August 17, 2012. That visit was after he had been cardioverted. We had reduced some of his medicines again and he was stable. He had a followup 2-D echo later in October, 2013. LV function was good. His aortic valve stenosis was mild. He has seen Dr. Johney Frame for electrophysiology consultation. Dr. Johney Frame felt it would be best to avoid ablation if possible. He felt that Tikosyn or amiodarone could be considered. He favored amiodarone prior to consideration of ablation. Everyone is in agreement.  The patient is stable. He's playing golf and he feels well.  Allergies  Allergen Reactions  . Guaifenesin & Derivatives   . Penicillins     Current Outpatient Prescriptions  Medication Sig Dispense Refill  . aspirin 81 MG tablet Take 81 mg by mouth 3 (three) times a week.       Marland Kitchen atorvastatin (LIPITOR) 10 MG tablet take 1 tablet by mouth once daily  30 tablet  9  . dabigatran (PRADAXA) 75 MG CAPS Take 1 capsule (75 mg total) by mouth every 12 (twelve) hours.  60 capsule  6  . dronedarone (MULTAQ) 400 MG tablet Take 200 mg in the AM and 400 mg in the PM      . metoprolol tartrate (LOPRESSOR) 25 MG tablet Take 0.5 tablets (12.5 mg total) by mouth 2 (two) times daily.  30 tablet  11  . naproxen sodium (ANAPROX) 220 MG tablet Take 220 mg by mouth as needed.       Marland Kitchen omeprazole (PRILOSEC) 20 MG capsule Take 20 mg by mouth. Take 1 tablet Monday, Wednesday, Friday, Saturday        History   Social History  . Marital Status: Widowed    Spouse Name: N/A    Number of Children: N/A  . Years of Education: N/A   Occupational History  . Not on file.   Social History Main Topics  . Smoking status: Former Games developer  . Smokeless tobacco: Former Neurosurgeon    Quit date: 08/01/1997  . Alcohol Use: Yes  . Drug Use: No  . Sexually Active:      Other Topics Concern  . Not on file   Social History Narrative  . No narrative on file    No family history on file.  Past Medical History  Diagnosis Date  . Coronary artery disease     minimal, cath 2005 / catheterization August, 2011, 30% in 3 vessels, normal LV function  . Hyperlipidemia   . Atrial fibrillation 2011    Multaq Started September, 2011, dose adjusted October, 2011, 200 mg a.m., 400 mg p.o.  . Monoclonal gammopathy     granfortuna  . TIA (transient ischemic attack)     Dr. Sandria Manly,, question in the past. when off ASA for 10 days  . GERD (gastroesophageal reflux disease)     Esophageal dilatation in 1992, some esophageal spasm  . S/P transurethral resection of prostate 2004    2004  . MR (mitral regurgitation) 2009    mild, Echo, July, 2011  . Cervical spine disease   . Renal insufficiency     Prior creatinine 1.2 /  September, 2011.. 1.7  /  October, 2011.. 1.8  . Ejection fraction     EF 60%, echo, 2009  /  TEE normal August,  2011 /   EF 60%, echo, July, 2011  . Drug therapy     Pradaxa Started July, 2011  . Aortic stenosis     Mild, echo, July, 2011  . Aortic insufficiency     Mild, echo, July, 2011  . Carotid artery disease     Doppler, 2008 no significant abnormality  . Diarrhea     Chronic  . Incomplete right bundle branch block     August, 2012  . Prostate cancer 11/22/2011  . Painless hematuria 03/27/2012    Occurred x 2 5/12 & 5/13 on Pradaxa 75 mg BID  . Bradycardia     Sinus bradycardia while on 25 Lopressor twice a day October, 2013    Past Surgical History  Procedure Date  . History of turp   . Hernia repair   . Cataract extraction   . Fibular fracture repair   . Cardioversion 08/01/2012    Procedure: CARDIOVERSION;  Surgeon: Luis Abed, MD;  Location: [facility] ENDOSCOPY;  Service: Cardiovascular;  Laterality: N/A;    Patient Active Problem List  Diagnosis  . DIARRHEA, CHRONIC  . Atrial fibrillation  . Coronary artery disease  .  Hyperlipidemia  . Monoclonal gammopathy  . TIA (transient ischemic attack)  . S/P transurethral resection of prostate  . MR (mitral regurgitation)  . Cervical spine disease  . Renal insufficiency  . Ejection fraction  . Drug therapy  . Aortic stenosis  . Aortic insufficiency  . Incomplete right bundle branch block  . Prostate cancer  . Painless hematuria  . Carotid artery disease  . Bradycardia    ROS   Patient denies fever, chills, headache, sweats, rash, change in vision, change in hearing, chest pain, cough, nausea vomiting, urinary symptoms. All other systems are reviewed and are negative.  PHYSICAL EXAM  Patient is oriented to person time and place. Affect is normal. There is no jugular venous distention. Lungs are clear. Respiratory effort is nonlabored. Cardiac exam reveals S1 and S2. There is a soft systolic murmur. The abdomen is soft. There is no peripheral edema. The rhythm is regular.  Filed Vitals:   10/17/12 1617  BP: 130/70  Pulse: 60  Height: 5\' 8"  (1.727 m)  Weight: 198 lb (89.812 kg)     ASSESSMENT & PLAN

## 2012-10-17 NOTE — Patient Instructions (Addendum)
Your physician wants you to follow-up in:  6 months. You will receive a reminder letter in the mail two months in advance. If you don't receive a letter, please call our office to schedule the follow-up appointment.   

## 2012-10-17 NOTE — Assessment & Plan Note (Signed)
The patient is holding sinus rhythm on physical exam. He is feeling well. No change in therapy. If he has recurrent atrial fibrillation on a frequent basis we will consider adding amiodarone. If it is very infrequent we could consider intermittent cardioversion. Ablation will not be considered by Dr. Johney Frame unless we have tried amiodarone without success.

## 2012-10-17 NOTE — Assessment & Plan Note (Signed)
Echo in October, 2013 revealed mild aortic insufficiency. There is mild aortic stenosis. There is significant leaflet calcification.

## 2012-10-23 ENCOUNTER — Encounter: Payer: Self-pay | Admitting: Cardiology

## 2012-10-23 NOTE — Progress Notes (Signed)
    I received a message from Dr. Cyndie Chime letting me know that the cancer center also has a clinic that can follow patients with anticoagulation. When I see the patient back we will decide where he will be followed specifically in this regard as relates to his Pradaxa.  [name] [name]

## 2013-01-22 ENCOUNTER — Telehealth: Payer: Self-pay

## 2013-01-22 NOTE — Telephone Encounter (Signed)
Dr [name] wanted Dr Myrtis Ser to know that he converted into afib on Friday around 1:30 am.  He states he increased his Multaq to 400 mg bid and his Metoprolol to 25 mg bid.  He states he converted to nsr on Saturday around 6:00pm.  He states he is still in nsr with a rate of 60 and feeling fine.  He wants to know if he needs to be seen sooner than June because of this episode of afib?

## 2013-01-29 ENCOUNTER — Encounter: Payer: Self-pay | Admitting: Cardiology

## 2013-01-29 NOTE — Progress Notes (Signed)
   The patient had called the office around January 22, 2013. He mentioned that he had had some atrial fibrillation. On his own he increased his Multaq to 400 twice a day and metoprolol to 25 twice a day. He reverted spontaneously after approximately 48 hours. Later I talked with him and he was feeling fine. There was no reason to bring him to the office for early followup.  Jerral Bonito, MD

## 2013-01-29 NOTE — Telephone Encounter (Signed)
I spoke with the patient in person. He does not need to come in for an earlier visit.

## 2013-01-31 ENCOUNTER — Other Ambulatory Visit: Payer: Self-pay

## 2013-01-31 DIAGNOSIS — Z79899 Other long term (current) drug therapy: Secondary | ICD-10-CM

## 2013-01-31 DIAGNOSIS — R001 Bradycardia, unspecified: Secondary | ICD-10-CM

## 2013-01-31 DIAGNOSIS — I350 Nonrheumatic aortic (valve) stenosis: Secondary | ICD-10-CM

## 2013-01-31 DIAGNOSIS — I2510 Atherosclerotic heart disease of native coronary artery without angina pectoris: Secondary | ICD-10-CM

## 2013-01-31 DIAGNOSIS — D472 Monoclonal gammopathy: Secondary | ICD-10-CM

## 2013-01-31 DIAGNOSIS — I340 Nonrheumatic mitral (valve) insufficiency: Secondary | ICD-10-CM

## 2013-01-31 DIAGNOSIS — I4891 Unspecified atrial fibrillation: Secondary | ICD-10-CM

## 2013-01-31 DIAGNOSIS — C61 Malignant neoplasm of prostate: Secondary | ICD-10-CM

## 2013-01-31 MED ORDER — METOPROLOL TARTRATE 25 MG PO TABS: 25.0000 mg | ORAL_TABLET | Freq: Two times a day (BID) | ORAL | Status: DC

## 2013-01-31 NOTE — Telephone Encounter (Signed)
In the documentation it stated that pt increased his Metoprolol to 1 tablet BID himself. He needs a refill and I didn't know if we send in a refill for 1/2 tablet BID or 1 tablet BID

## 2013-02-08 ENCOUNTER — Other Ambulatory Visit: Payer: Self-pay | Admitting: Dermatology

## 2013-02-28 ENCOUNTER — Telehealth: Payer: Self-pay | Admitting: Oncology

## 2013-02-28 NOTE — Telephone Encounter (Signed)
pt needed diff time for lab on 5.5.14 due to another appt....Marland KitchenMarland KitchenDone

## 2013-03-19 ENCOUNTER — Other Ambulatory Visit (HOSPITAL_BASED_OUTPATIENT_CLINIC_OR_DEPARTMENT_OTHER): Payer: Medicare PPO

## 2013-03-19 ENCOUNTER — Other Ambulatory Visit: Payer: Medicare Other

## 2013-03-19 DIAGNOSIS — C61 Malignant neoplasm of prostate: Secondary | ICD-10-CM

## 2013-03-19 DIAGNOSIS — D472 Monoclonal gammopathy: Secondary | ICD-10-CM

## 2013-03-19 LAB — COMPREHENSIVE METABOLIC PANEL (CC13)
Albumin: 3.3 g/dL — ABNORMAL LOW (ref 3.5–5.0)
BUN: 20.5 mg/dL (ref 7.0–26.0)
CO2: 26 mEq/L (ref 22–29)
Glucose: 85 mg/dl (ref 70–99)
Potassium: 4.8 mEq/L (ref 3.5–5.1)
Sodium: 137 mEq/L (ref 136–145)
Total Bilirubin: 0.67 mg/dL (ref 0.20–1.20)
Total Protein: 7.7 g/dL (ref 6.4–8.3)

## 2013-03-19 LAB — CBC WITH DIFFERENTIAL/PLATELET
EOS%: 4.8 % (ref 0.0–7.0)
Eosinophils Absolute: 0.3 10*3/uL (ref 0.0–0.5)
LYMPH%: 30.4 % (ref 14.0–49.0)
MCH: 33 pg (ref 27.2–33.4)
MCV: 99.7 fL — ABNORMAL HIGH (ref 79.3–98.0)
MONO%: 10.7 % (ref 0.0–14.0)
NEUT#: 3.9 10*3/uL (ref 1.5–6.5)
Platelets: 220 10*3/uL (ref 140–400)
RBC: 3.33 10*6/uL — ABNORMAL LOW (ref 4.20–5.82)
RDW: 13.4 % (ref 11.0–14.6)

## 2013-03-19 LAB — MORPHOLOGY

## 2013-03-20 LAB — KAPPA/LAMBDA LIGHT CHAINS
Kappa free light chain: 2.5 mg/dL — ABNORMAL HIGH (ref 0.33–1.94)
Lambda Free Lght Chn: 0.96 mg/dL (ref 0.57–2.63)

## 2013-04-04 ENCOUNTER — Other Ambulatory Visit: Payer: Self-pay

## 2013-04-04 MED ORDER — ATORVASTATIN CALCIUM 10 MG PO TABS: 10.0000 mg | ORAL_TABLET | Freq: Every day | ORAL | Status: DC

## 2013-04-04 NOTE — Telephone Encounter (Signed)
atorvastatin (LIPITOR) 10 MG tablet  take 1 tablet by mouth once daily   30 tablet   9    Patient Instructions    Your physician wants you to follow-up in: 6 months.   You will receive a reminder letter in the mail two months in advance. If you don't receive a letter, please call our office to schedule the follow-up appointment.    Patient Instructions History Recorded     Previous Visit      Provider Department Encounter #    10/16/2012  1:47 PM Willa Rough, MD [facility] [place] [id number]

## 2013-04-16 ENCOUNTER — Ambulatory Visit: Payer: Medicare Other | Admitting: Cardiology

## 2013-05-04 ENCOUNTER — Other Ambulatory Visit: Payer: Self-pay

## 2013-05-04 MED ORDER — DABIGATRAN ETEXILATE MESYLATE 75 MG PO CAPS: 75.0000 mg | ORAL_CAPSULE | Freq: Two times a day (BID) | ORAL | Status: DC

## 2013-05-04 NOTE — Telephone Encounter (Signed)
Drug therapy - [name] Abed, MD at 10/17/2012  5:02 PM  Status: Written Related Problem: Drug therapy  The patient is on Pradaxa. It is our preference that he not be on aspirin. He wants to remain on low-dose aspirin. He will be followed on these meds. I will consider referral to the anticoagulation clinic.

## 2013-05-09 ENCOUNTER — Ambulatory Visit (INDEPENDENT_AMBULATORY_CARE_PROVIDER_SITE_OTHER): Payer: Medicare PPO | Admitting: Cardiology

## 2013-05-09 ENCOUNTER — Encounter: Payer: Self-pay | Admitting: Cardiology

## 2013-05-09 VITALS — BP 100/78 | HR 52 | Ht 68.0 in | Wt 197.1 lb

## 2013-05-09 DIAGNOSIS — I359 Nonrheumatic aortic valve disorder, unspecified: Secondary | ICD-10-CM

## 2013-05-09 DIAGNOSIS — R29898 Other symptoms and signs involving the musculoskeletal system: Secondary | ICD-10-CM | POA: Insufficient documentation

## 2013-05-09 DIAGNOSIS — R413 Other amnesia: Secondary | ICD-10-CM | POA: Insufficient documentation

## 2013-05-09 DIAGNOSIS — I4891 Unspecified atrial fibrillation: Secondary | ICD-10-CM

## 2013-05-09 DIAGNOSIS — I350 Nonrheumatic aortic (valve) stenosis: Secondary | ICD-10-CM

## 2013-05-09 DIAGNOSIS — I739 Peripheral vascular disease, unspecified: Secondary | ICD-10-CM

## 2013-05-09 DIAGNOSIS — R0989 Other specified symptoms and signs involving the circulatory and respiratory systems: Secondary | ICD-10-CM

## 2013-05-09 NOTE — Assessment & Plan Note (Signed)
The patient tells me today that he is concerned about some short-term memory issues. This worries him because it limits him from continuing to learn. I have not made any changes.

## 2013-05-09 NOTE — Assessment & Plan Note (Signed)
Currently his atrial fib is controlled. No change in therapy.

## 2013-05-09 NOTE — Assessment & Plan Note (Signed)
He mentions his symptom as outlined above. After walking up a hill he feels marked fatigue in his lower body. There is no pain. It does not sound like claudication but we need to rule out a vascular abnormality. He does have a decreased dorsalis pedis pulse in the left foot. We will start by ruling out abdominal aortic aneurysm or iliac disease with an ultrasound.

## 2013-05-09 NOTE — Progress Notes (Signed)
HPI Patient is seen to followup atrial fibrillation. [name] [name] is actually doing well. Several months ago [name] [name] made a mild adjustment in his meds when [name] [name] had some mild difficulties. [name] [name] then adjusted them back. Today [name] [name] tells me that [name] [name] is concerned about 2 issues. [name] [name] thinks [name] [name] is having some problems with short-term memory. [name] [name] wonders if this could be related to the beta blocker. However, [name] [name] does not want to make any changes. [name] [name] also mentions that there is an area near his house where [name] [name] walks downhill to feed the ducks. When [name] [name] returns up the hill [name] [name] does not have shortness of breath. [name] [name] does not have chest pain. [name] [name] has an overwhelming fatigue in his lower body at the level of his buttocks  And thighs. There is no pain.[name] [name] rests and this improves.  Allergies  Allergen Reactions  . Guaifenesin & Derivatives   . Penicillins     Current Outpatient Prescriptions  Medication Sig Dispense Refill  . aspirin 81 MG tablet Take 81 mg by mouth 3 (three) times a week.       . dronedarone (MULTAQ) 400 MG tablet Take 400 mg in the AM and 400 mg in the PM      . metoprolol tartrate (LOPRESSOR) 25 MG tablet Take 1 tablet (25 mg total) by mouth 2 (two) times daily.  30 tablet  11  . naproxen sodium (ANAPROX) 220 MG tablet Take 220 mg by mouth as needed.       Marland Kitchen omeprazole (PRILOSEC) 20 MG capsule Take 20 mg by mouth. Take 1 tablet Monday, Wednesday, Friday, Saturday      . atorvastatin (LIPITOR) 10 MG tablet Take 1 tablet (10 mg total) by mouth daily.  30 tablet  9  . dabigatran (PRADAXA) 75 MG CAPS Take 1 capsule (75 mg total) by mouth every 12 (twelve) hours.  60 capsule  6   No current facility-administered medications for this visit.    History   Social History  . Marital Status: Widowed    Spouse Name: N/A    Number of Children: N/A  . Years of Education: N/A   Occupational History  . Not on file.   Social History Main Topics  . Smoking status: Former Games developer  . Smokeless tobacco: Former Neurosurgeon   Quit date: 08/01/1997  . Alcohol Use: Yes  . Drug Use: No  . Sexually Active:    Other Topics Concern  . Not on file   Social History Narrative  . No narrative on file    History reviewed. No pertinent family history.  Past Medical History  Diagnosis Date  . Coronary artery disease     minimal, cath 2005 / catheterization August, 2011, 30% in 3 vessels, normal LV function  . Hyperlipidemia   . Atrial fibrillation 2011    Multaq Started September, 2011, dose adjusted October, 2011, 200 mg a.m., 400 mg p.o.  . Monoclonal gammopathy     granfortuna  . TIA (transient ischemic attack)     Dr. Sandria Manly,, question in the past. when off ASA for 10 days  . GERD (gastroesophageal reflux disease)     Esophageal dilatation in 1992, some esophageal spasm  . S/P transurethral resection of prostate 2004    2004  . MR (mitral regurgitation) 2009    mild, Echo, July, 2011  . Cervical spine disease   . Renal insufficiency     Prior creatinine 1.2 /  September, 2011.. 1.7  /  October, 2011.. 1.8  .  Ejection fraction     EF 60%, echo, 2009  /  TEE normal August, 2011 /   EF 60%, echo, July, 2011  . Drug therapy     Pradaxa Started July, 2011  . Aortic stenosis     Mild, echo, July, 2011  . Aortic insufficiency     Mild, echo, July, 2011  . Carotid artery disease     Doppler, 2008 no significant abnormality  . Diarrhea     Chronic  . Incomplete right bundle branch block     August, 2012  . Prostate cancer 11/22/2011  . Painless hematuria 03/27/2012    Occurred x 2 5/12 & 5/13 on Pradaxa 75 mg BID  . Bradycardia     Sinus bradycardia while on 25 Lopressor twice a day October, 2013    Past Surgical History  Procedure Laterality Date  . History of turp    . Hernia repair    . Cataract extraction    . Fibular fracture repair    . Cardioversion  08/01/2012    Procedure: CARDIOVERSION;  Surgeon: Luis Abed, MD;  Location: [facility] ENDOSCOPY;  Service: Cardiovascular;  Laterality: N/A;     Patient Active Problem List   Diagnosis Date Noted  . Incomplete right bundle branch block     Priority: High  . Atrial fibrillation     Priority: High  . Coronary artery disease     Priority: High  . MR (mitral regurgitation)     Priority: High  . Drug therapy     Priority: High  . Aortic stenosis     Priority: High  . Aortic insufficiency     Priority: High  . Bradycardia   . Carotid artery disease   . Painless hematuria 03/27/2012  . Prostate cancer 11/22/2011  . Hyperlipidemia   . Monoclonal gammopathy   . TIA (transient ischemic attack)   . S/P transurethral resection of prostate   . Cervical spine disease   . Renal insufficiency   . Ejection fraction   . DIARRHEA, CHRONIC 10/14/2010    ROS   patient denies fever, chills, headache, sweats, rash, change in vision, change in hearing, chest pain, cough, nausea vomiting, urinary symptoms. All other systems are reviewed and are negative.  PHYSICAL EXAM   [name] [name] really looks good. [name] [name] is oriented to person time and place. Affect is normal. There is no jugulovenous distention. Lungs are clear. Respiratory effort is nonlabored. Cardiac exam reveals a crescendo decrescendo systolic murmur compatible with is mild aortic stenosis. The abdomen is soft. There is question of an abdominal bruit. There is no peripheral edema. There are no musculoskeletal deformities. There are no skin rashes. [name] [name] has palpable dorsalis pedis and posterior tibial pulses on the right foot. There is a posterior tib pulse on the left, but I do not feel dorsalis pedis.   Filed Vitals:   05/09/13 1345  BP: 100/78  Pulse: 52  Height: 5\' 8"  (1.727 m)  Weight: 197 lb 1.9 oz (89.413 kg)  SpO2: 98%     ASSESSMENT & PLan

## 2013-05-09 NOTE — Assessment & Plan Note (Signed)
There is no need to do a followup echo yet.

## 2013-05-09 NOTE — Patient Instructions (Addendum)
Your physician recommends that you continue on your current medications as directed. Please refer to the Current Medication list given to you today.  Your physician recommends that you have a Aorto-iliac Duplex, we will arrange.  Your physician recommends that you schedule a follow-up appointment in: 3 months

## 2013-05-11 ENCOUNTER — Encounter: Payer: Self-pay | Admitting: Cardiology

## 2013-05-14 ENCOUNTER — Encounter (INDEPENDENT_AMBULATORY_CARE_PROVIDER_SITE_OTHER): Payer: Medicare PPO

## 2013-05-14 DIAGNOSIS — I700 Atherosclerosis of aorta: Secondary | ICD-10-CM

## 2013-05-14 DIAGNOSIS — I70219 Atherosclerosis of native arteries of extremities with intermittent claudication, unspecified extremity: Secondary | ICD-10-CM

## 2013-05-14 DIAGNOSIS — R0989 Other specified symptoms and signs involving the circulatory and respiratory systems: Secondary | ICD-10-CM

## 2013-05-14 DIAGNOSIS — I739 Peripheral vascular disease, unspecified: Secondary | ICD-10-CM

## 2013-06-20 ENCOUNTER — Other Ambulatory Visit: Payer: Self-pay

## 2013-07-23 ENCOUNTER — Encounter: Payer: Self-pay | Admitting: Cardiology

## 2013-07-25 ENCOUNTER — Ambulatory Visit (INDEPENDENT_AMBULATORY_CARE_PROVIDER_SITE_OTHER): Payer: Medicare PPO | Admitting: Cardiology

## 2013-07-25 ENCOUNTER — Encounter: Payer: Self-pay | Admitting: Cardiology

## 2013-07-25 VITALS — BP 130/70 | HR 55 | Ht 68.0 in | Wt 203.0 lb

## 2013-07-25 DIAGNOSIS — I359 Nonrheumatic aortic valve disorder, unspecified: Secondary | ICD-10-CM

## 2013-07-25 DIAGNOSIS — I4891 Unspecified atrial fibrillation: Secondary | ICD-10-CM

## 2013-07-25 DIAGNOSIS — I350 Nonrheumatic aortic (valve) stenosis: Secondary | ICD-10-CM

## 2013-07-25 DIAGNOSIS — I498 Other specified cardiac arrhythmias: Secondary | ICD-10-CM

## 2013-07-25 DIAGNOSIS — R29898 Other symptoms and signs involving the musculoskeletal system: Secondary | ICD-10-CM

## 2013-07-25 DIAGNOSIS — R001 Bradycardia, unspecified: Secondary | ICD-10-CM

## 2013-07-25 DIAGNOSIS — Z79899 Other long term (current) drug therapy: Secondary | ICD-10-CM

## 2013-07-25 DIAGNOSIS — R197 Diarrhea, unspecified: Secondary | ICD-10-CM

## 2013-07-25 NOTE — Patient Instructions (Signed)
**Note De-identified  Obfuscation** Your physician recommends that you continue on your current medications as directed. Please refer to the Current Medication list given to you today.  Your physician wants you to follow-up in: 6 months. You will receive a reminder letter in the mail two months in advance. If you don't receive a letter, please call our office to schedule the follow-up appointment.  

## 2013-07-25 NOTE — Assessment & Plan Note (Signed)
He is holding sinus rhythm. We will continue his current therapy.

## 2013-07-25 NOTE — Progress Notes (Signed)
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 He is doing well. He continues to play golf many times a week. He wishes that his stamina were  Better,  but he is actually doing well.  Allergies  Allergen Reactions  . Guaifenesin & Derivatives   . Penicillins     Current Outpatient Prescriptions  Medication Sig Dispense Refill  . aspirin 81 MG tablet Take 81 mg by mouth 3 (three) times a week.       Marland Kitchen atorvastatin (LIPITOR) 10 MG tablet Take 1 tablet (10 mg total) by mouth daily.  30 tablet  9  . dabigatran (PRADAXA) 75 MG CAPS Take 1 capsule (75 mg total) by mouth every 12 (twelve) hours.  60 capsule  6  . dronedarone (MULTAQ) 400 MG tablet Take 400 mg in the AM and 400 mg in the PM      . metoprolol tartrate (LOPRESSOR) 25 MG tablet Take 1 tablet (25 mg total) by mouth 2 (two) times daily.  30 tablet  11  . naproxen sodium (ANAPROX) 220 MG tablet Take 220 mg by mouth as needed.       Marland Kitchen omeprazole (PRILOSEC) 20 MG capsule Take 20 mg by mouth. Take 1 tablet Monday, Wednesday, Friday, Saturday       No current facility-administered medications for this visit.    History   Social History  . Marital Status: Widowed    Spouse Name: N/A    Number of Children: N/A  . Years of Education: N/A   Occupational History  . Not on file.   Social History Main Topics  . Smoking status: Former Games developer  . Smokeless tobacco: Former Neurosurgeon    Quit date: 08/01/1997  . Alcohol Use: Yes  . Drug Use: No  . Sexual Activity:    Other Topics Concern  . Not on file   Social History Narrative  . No narrative on file    No family history on file.  Past Medical History  Diagnosis Date  . Coronary artery disease     minimal, cath 2005 / catheterization August, 2011, 30% in 3 vessels, normal LV function  . Hyperlipidemia   . Atrial fibrillation 2011    Multaq Started September, 2011, dose adjusted October, 2011, 200 mg a.m., 400 mg p.o.  . Monoclonal gammopathy       granfortuna  . TIA (transient ischemic attack)     Dr. Sandria Manly,, question in the past. when off ASA for 10 days  . GERD (gastroesophageal reflux disease)     Esophageal dilatation in 1992, some esophageal spasm  . S/P transurethral resection of prostate 2004    2004  . MR (mitral regurgitation) 2009    mild, Echo, July, 2011  . Cervical spine disease   . Renal insufficiency     Prior creatinine 1.2 /  September, 2011.. 1.7  /  October, 2011.. 1.8  . Ejection fraction     EF 60%, echo, 2009  /  TEE normal August, 2011 /   EF 60%, echo, July, 2011  . Drug therapy     Pradaxa Started July, 2011  . Aortic stenosis     Mild, echo, July, 2011  . Aortic insufficiency     Mild, echo, July, 2011  . Carotid artery disease     Doppler, 2008 no significant abnormality  . Diarrhea     Chronic  . Incomplete right bundle branch block     August, 2012  .  Prostate cancer 11/22/2011  . Painless hematuria 03/27/2012    Occurred x 2 5/12 & 5/13 on Pradaxa 75 mg BID  . Bradycardia     Sinus bradycardia while on 25 Lopressor twice a day October, 2013  . Memory change     June, 2014  . Leg fatigue     June, 2014    Past Surgical History  Procedure Laterality Date  . History of turp    . Hernia repair    . Cataract extraction    . Fibular fracture repair    . Cardioversion  08/01/2012    Procedure: CARDIOVERSION;  Surgeon: Luis Abed, MD;  Location: [facility] ENDOSCOPY;  Service: Cardiovascular;  Laterality: N/A;    Patient Active Problem List   Diagnosis Date Noted  . Incomplete right bundle branch block     Priority: High  . Atrial fibrillation     Priority: High  . Coronary artery disease     Priority: High  . MR (mitral regurgitation)     Priority: High  . Drug therapy     Priority: High  . Aortic stenosis     Priority: High  . Aortic insufficiency     Priority: High  . Memory change   . Leg fatigue   . Bradycardia   . Carotid artery disease   . Painless hematuria 03/27/2012   . Prostate cancer 11/22/2011  . Hyperlipidemia   . Monoclonal gammopathy   . TIA (transient ischemic attack)   . S/P transurethral resection of prostate   . Cervical spine disease   . Renal insufficiency   . Ejection fraction   . DIARRHEA, CHRONIC 10/14/2010    ROS   Patient denies fever, chills, headache, sweats, rash, change in vision, change in hearing, chest pain, cough, nausea vomiting, urinary symptoms. All other systems are reviewed and are negative.  PHYSICAL EXAM   He looks good today. He is oriented to person time and place. Affect is normal. There is no jugulovenous distention. Lungs are clear. Respiratory effort is nonlabored. Cardiac exam reveals his crescendo decrescendo systolic murmur from the aortic valve. There is some radiation to the neck. Abdomen is soft. There is no peripheral edema. His rhythm is regular.  Filed Vitals:   07/25/13 1021  BP: 130/70  Pulse: 55  Height: 5\' 8"  (1.727 m)  Weight: 203 lb (92.08 kg)  SpO2: 98%    ASSESSMENT & PLAN

## 2013-07-25 NOTE — Assessment & Plan Note (Signed)
He has not had any significant diarrhea recently.

## 2013-07-25 NOTE — Assessment & Plan Note (Signed)
His aortic valvular disease is mild. No further workup

## 2013-07-25 NOTE — Assessment & Plan Note (Signed)
At this time he is tolerating his low dose beta blocker

## 2013-07-25 NOTE — Assessment & Plan Note (Signed)
I saw him last we wanted to be sure he was not having symptoms from peripheral vascular disease. Abdominal aortic duplex revealed normal abdominal aorta. Also there was no evidence of any stenoses of the major vessels to his legs. He is stable with this. He has symptoms only when walking up a hill. I suspect this is more of a stamina issue than true claudication. No further workup

## 2013-07-25 NOTE — Assessment & Plan Note (Signed)
He continues on Pradaxa. He is stable with this

## 2013-07-31 ENCOUNTER — Other Ambulatory Visit: Payer: Self-pay | Admitting: Dermatology

## 2013-08-06 ENCOUNTER — Other Ambulatory Visit: Payer: Self-pay | Admitting: *Deleted

## 2013-08-06 MED ORDER — DRONEDARONE HCL 400 MG PO TABS: 400.0000 mg | ORAL_TABLET | Freq: Two times a day (BID) | ORAL | Status: DC

## 2013-08-08 ENCOUNTER — Ambulatory Visit: Payer: Medicare PPO | Admitting: Cardiology

## 2013-08-20 ENCOUNTER — Other Ambulatory Visit: Payer: Self-pay | Admitting: *Deleted

## 2013-08-20 DIAGNOSIS — D472 Monoclonal gammopathy: Secondary | ICD-10-CM

## 2013-08-20 DIAGNOSIS — I340 Nonrheumatic mitral (valve) insufficiency: Secondary | ICD-10-CM

## 2013-08-20 DIAGNOSIS — I350 Nonrheumatic aortic (valve) stenosis: Secondary | ICD-10-CM

## 2013-08-20 DIAGNOSIS — Z79899 Other long term (current) drug therapy: Secondary | ICD-10-CM

## 2013-08-20 DIAGNOSIS — C61 Malignant neoplasm of prostate: Secondary | ICD-10-CM

## 2013-08-20 DIAGNOSIS — I4891 Unspecified atrial fibrillation: Secondary | ICD-10-CM

## 2013-08-20 DIAGNOSIS — I2510 Atherosclerotic heart disease of native coronary artery without angina pectoris: Secondary | ICD-10-CM

## 2013-08-20 DIAGNOSIS — R001 Bradycardia, unspecified: Secondary | ICD-10-CM

## 2013-08-20 MED ORDER — METOPROLOL TARTRATE 25 MG PO TABS: 25.0000 mg | ORAL_TABLET | Freq: Two times a day (BID) | ORAL | Status: DC

## 2013-09-17 ENCOUNTER — Other Ambulatory Visit: Payer: Medicare Other | Admitting: Lab

## 2013-09-17 ENCOUNTER — Other Ambulatory Visit (HOSPITAL_BASED_OUTPATIENT_CLINIC_OR_DEPARTMENT_OTHER): Payer: Medicare PPO | Admitting: Lab

## 2013-09-17 ENCOUNTER — Telehealth: Payer: Self-pay | Admitting: Oncology

## 2013-09-17 DIAGNOSIS — C61 Malignant neoplasm of prostate: Secondary | ICD-10-CM

## 2013-09-17 DIAGNOSIS — D472 Monoclonal gammopathy: Secondary | ICD-10-CM

## 2013-09-17 LAB — COMPREHENSIVE METABOLIC PANEL (CC13)
Albumin: 3.5 g/dL (ref 3.5–5.0)
Anion Gap: 8 mEq/L (ref 3–11)
BUN: 17.8 mg/dL (ref 7.0–26.0)
CO2: 23 mEq/L (ref 22–29)
Glucose: 96 mg/dl (ref 70–140)
Potassium: 4.5 mEq/L (ref 3.5–5.1)
Sodium: 138 mEq/L (ref 136–145)
Total Bilirubin: 0.91 mg/dL (ref 0.20–1.20)
Total Protein: 8.1 g/dL (ref 6.4–8.3)

## 2013-09-17 LAB — CBC WITH DIFFERENTIAL/PLATELET
BASO%: 0.4 % (ref 0.0–2.0)
Basophils Absolute: 0 10*3/uL (ref 0.0–0.1)
EOS%: 4.7 % (ref 0.0–7.0)
HCT: 34 % — ABNORMAL LOW (ref 38.4–49.9)
HGB: 11.4 g/dL — ABNORMAL LOW (ref 13.0–17.1)
MCH: 33.3 pg (ref 27.2–33.4)
MONO#: 0.7 10*3/uL (ref 0.1–0.9)
NEUT#: 3.6 10*3/uL (ref 1.5–6.5)
NEUT%: 50 % (ref 39.0–75.0)
RBC: 3.42 10*6/uL — ABNORMAL LOW (ref 4.20–5.82)
RDW: 13.8 % (ref 11.0–14.6)
WBC: 7.3 10*3/uL (ref 4.0–10.3)
lymph#: 2.6 10*3/uL (ref 0.9–3.3)

## 2013-09-17 LAB — MORPHOLOGY: PLT EST: ADEQUATE

## 2013-09-17 NOTE — Telephone Encounter (Signed)
, °

## 2013-09-19 ENCOUNTER — Telehealth: Payer: Self-pay | Admitting: *Deleted

## 2013-09-19 LAB — IGG: IgG (Immunoglobin G), Serum: 2660 mg/dL — ABNORMAL HIGH (ref 650–1600)

## 2013-09-19 LAB — KAPPA/LAMBDA LIGHT CHAINS: Kappa free light chain: 2.94 mg/dL — ABNORMAL HIGH (ref 0.33–1.94)

## 2013-09-19 NOTE — Telephone Encounter (Signed)
Message copied by Orbie Hurst on Wed Sep 19, 2013  1:56 PM ------      Message from: [name]      Created: Wed Sep 19, 2013  9:47 AM       Call Dr [name] - lab all in range of previous values - please release for his review in my chart ------

## 2013-09-19 NOTE — Telephone Encounter (Signed)
Spoke with patient.  Let him know that labs were all in range of previous values.  I am unable to release this for him to see, however the results should automatically release after 4 days.  He is aware of his appt. To see Dr. Cyndie Chime on Monday 09/24/13.

## 2013-09-24 ENCOUNTER — Ambulatory Visit (HOSPITAL_BASED_OUTPATIENT_CLINIC_OR_DEPARTMENT_OTHER): Payer: Medicare PPO | Admitting: Oncology

## 2013-09-24 VITALS — BP 155/72 | HR 48 | Temp 98.2°F | Resp 18 | Ht 68.0 in | Wt 198.3 lb

## 2013-09-24 DIAGNOSIS — K219 Gastro-esophageal reflux disease without esophagitis: Secondary | ICD-10-CM

## 2013-09-24 DIAGNOSIS — I4891 Unspecified atrial fibrillation: Secondary | ICD-10-CM

## 2013-09-24 DIAGNOSIS — D472 Monoclonal gammopathy: Secondary | ICD-10-CM

## 2013-09-24 DIAGNOSIS — C61 Malignant neoplasm of prostate: Secondary | ICD-10-CM

## 2013-09-24 NOTE — Progress Notes (Signed)
Hematology and Oncology Follow Up Visit  [name] [name] [id number] [birth year] 79 y.o. 09/24/2013 8:26 PM   Principle Diagnosis: Encounter Diagnoses  Name Primary?  . Prostate cancer   . 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  He has chronic atrial fibrillation. Initial onset in 2011. He underwent cardioversion at that time(06/18/2010). He has been maintained on Multaq 200 mg in the morning 400 mg in the evening as well as a beta blocker. He is anticoagulated with Pradaxa 75 mg twice daily. He is also on aspirin 81 mg 3 times a week. He had an exacerbation and required a second cardioversion on 07/24/2012 and he tolerated this well. He did not initially tolerate full doses of his Multaq but over time, this has been able to be escalated to full dose.Marland Kitchen His beta blocker was adjusted to a current dose of metoprolol 25 mg twice daily.  He has had no interim medical problems.   Medications: reviewed  Allergies:  Allergies  Allergen Reactions  . Guaifenesin & Derivatives   . Penicillins     Review of Systems: Hematology: See above ENT ROS no sore throat Breast ROS:  Respiratory ROS no cough or dyspnea Cardiovascular ROS:   No ischemic chest pain, pressure, or recent palpitations Gastrointestinal ROS:   No abdominal pain or change in bowel habit Genito-Urinary ROS: No urinary tract symptoms. He is still followed by urology on an annual basis and gets his rectal exam by Dr. Patsi [name]. Musculoskeletal ROS: He describes increasing soreness in his gluteal muscles particularly on the right which only occurs when he is walking up a flight of stairs or a hill. Neurological ROS: No headache or change in  vision Dermatological ROS no rash or ecchymosis Remaining ROS negative.  Physical Exam: Blood pressure 155/72, pulse 48, temperature 98.2 F (36.8 C), temperature source Oral, resp. rate 18, height 5\' 8"  (1.727 m), weight 198 lb 4.8 oz (89.948 kg). Wt Readings from Last 3 Encounters:  09/24/13 198 lb 4.8 oz (89.948 kg)  07/25/13 203 lb (92.08 kg)  05/09/13 197 lb 1.9 oz (89.413 kg)     General appearance: Well-nourished Caucasian man HENNT: Pharynx no erythema, exudate, mass, or ulcer. No thyromegaly or thyroid nodules Lymph nodes: No cervical, supraclavicular, or axillary lymphadenopathy Breasts: Lungs: Clear to auscultation, resonant to percussion throughout Heart: Regular rhythm, 2/6 aortic systolic murmur no gallop, no rub, no click, no edema Abdomen: Soft, nontender, normal bowel sounds, no mass, no organomegaly Extremities: No edema, no calf tenderness Musculoskeletal: no joint deformities GU Vascular: Carotid pulses 2+, no bruits,  Neurologic: Alert, oriented, PERRLA,  cranial nerves grossly normal, motor strength 5 over 5, reflexes 1+ symmetric, upper body coordination normal, gait normal, Skin: No rash or ecchymosis  Lab Results: CBC W/Diff    Component Value Date/Time   WBC 7.3 09/17/2013 1030   WBC 8.1 07/24/2012 1508   RBC 3.42* 09/17/2013 1030   RBC 3.62* 07/24/2012 1508   HGB 11.4* 09/17/2013 1030   HGB 12.0* 07/24/2012 1508   HCT 34.0* 09/17/2013 1030   HCT 36.3* 07/24/2012 1508   PLT 217 09/17/2013 1030   PLT 228.0 07/24/2012 1508   MCV 99.4* 09/17/2013 1030   MCV 100.3* 07/24/2012 1508   MCH 33.3 09/17/2013 1030   MCH 32.9  06/18/2010 0423   MCHC 33.5 09/17/2013 1030   MCHC 33.1 07/24/2012 1508   RDW 13.8 09/17/2013 1030   RDW 13.9 07/24/2012 1508   LYMPHSABS 2.6 09/17/2013 1030   LYMPHSABS 2.5 07/24/2012 1508   MONOABS 0.7 09/17/2013 1030   MONOABS 0.8 07/24/2012 1508   EOSABS 0.3 09/17/2013 1030   EOSABS 0.2 07/24/2012 1508   BASOSABS 0.0 09/17/2013 1030   BASOSABS 0.0 07/24/2012  1508     Chemistry      Component Value Date/Time   NA 138 09/17/2013 1030   NA 139 07/24/2012 1508   K 4.5 09/17/2013 1030   K 4.3 07/24/2012 1508   CL 107 03/19/2013 0949   CL 107 07/24/2012 1508   CO2 23 09/17/2013 1030   CO2 25 07/24/2012 1508   BUN 17.8 09/17/2013 1030   BUN 19 07/24/2012 1508   CREATININE 1.6* 09/17/2013 1030   CREATININE 1.5 07/24/2012 1508   CREATININE 1.61* 08/26/2010 0955      Component Value Date/Time   CALCIUM 9.5 09/17/2013 1030   CALCIUM 9.0 07/24/2012 1508   ALKPHOS 42 09/17/2013 1030   ALKPHOS 37* 03/20/2012 1049   AST 23 09/17/2013 1030   AST 20 03/20/2012 1049   ALT 18 09/17/2013 1030   ALT 16 03/20/2012 1049   BILITOT 0.91 09/17/2013 1030   BILITOT 0.6 03/20/2012 1049    IgG: 2660 mg percent within range of previous values Serum free light chains:kappa 2.94, lambda 1.41, ratio 2.09, all within range of previous values PSA: 2.55-within range of previous diabetes  Impression:   #1. IgG kappa monoclonal gammopathy of undetermined significance. Initial diagnosis July 2000.   IgG and free light chains remain stable.  His hemoglobin is down 1 g from his baseline. His renal function has slowly declined with creatinine now 1.6. I'm going to go ahead and get a 24-hour urine for creatinine clearance, total protein, and immunofixation electrophoresis. Otherwise continue every six-month lab monitoring. #2. Localized prostate cancer treated with radiofrequency ablation at time of diagnosis in February of 2000. Stable PSA with minor fluctuations.  #3. History of self-limited gross hematuria  This may be a result of anticoagulation with Pradaxa.  #4. Single vessel coronary artery disease. Asymptomatic. #5. Aortic systolic murmur with mild aortic stenosis and regurgitation on echocardiogram.  #6. Atrial fibrillation diagnosed in 2011 . Currently stable on Multaq(Dronedarone). 400 mg twice a day and Lopressor 25 mg twice a day. He remains on chronic anticoagulation with Pradaxa and  aspirin. #7. GERD  #8. Hyperlipidemia  #9. Status post excision multiple basal cell carcinomas  #10. Degenerative arthritis of the spine.  #11. Remote history of a transient ischemic attack.   CC: Patient Care Team: [name], MD as PCP - General (Hematology and Oncology) Evie Lacks, MD (Neurology) Florencia Reasons, MD (Gastroenterology) Kathi Ludwig, MD (Urology)   [name], MD 11/10/20148:26 PM

## 2013-09-27 ENCOUNTER — Ambulatory Visit: Payer: Medicare PPO | Admitting: Lab

## 2013-09-27 DIAGNOSIS — D472 Monoclonal gammopathy: Secondary | ICD-10-CM

## 2013-09-28 LAB — KAPPA/LAMBDA LIGHT CHAINS: Kappa free light chain: 3.49 mg/dL — ABNORMAL HIGH (ref 0.33–1.94)

## 2013-10-01 LAB — UIFE/LIGHT CHAINS/TP QN, 24-HR UR
Albumin, U: DETECTED
Free Kappa Lt Chains,Ur: 6.55 mg/dL — ABNORMAL HIGH (ref 0.14–2.42)
Free Kappa/Lambda Ratio: 59.55 ratio — ABNORMAL HIGH (ref 2.04–10.37)
Free Lambda Excretion/Day: 1.87 mg/d
Free Lambda Lt Chains,Ur: 0.11 mg/dL (ref 0.02–0.67)
Free Lt Chn Excr Rate: 111.35 mg/d
Gamma Globulin, Urine: DETECTED — AB
Time: 24 hours
Total Protein, Urine: 8.2 mg/dL
Volume, Urine: 1700 mL

## 2013-10-01 LAB — CREATININE CLEARANCE, URINE, 24 HOUR
Creatinine, 24H Ur: 1713 mg/d (ref 800–2000)
Creatinine, Urine: 100.8 mg/dL

## 2013-11-02 ENCOUNTER — Other Ambulatory Visit: Payer: Self-pay | Admitting: *Deleted

## 2013-11-02 MED ORDER — METHYLPREDNISOLONE 4 MG PO KIT: PACK | ORAL | Status: DC

## 2013-11-27 ENCOUNTER — Telehealth: Payer: Self-pay | Admitting: Cardiology

## 2013-11-27 NOTE — Telephone Encounter (Signed)
New message         Pt has gone back into afib and would like to know what dr Ron Parker wants him to do.

## 2013-11-27 NOTE — Telephone Encounter (Signed)
**Note De-Identified  Obfuscation** Per Dr Ron Parker the pt is advised to come to the office tomorrow morning to have EKG done. Once the triage nurse obtains EKG she should call Dr Ron Parker for instructions. The pt may need cardioversion. Pt agrees with plan and is coming to office at 9 am in the morning for EKG.

## 2013-11-27 NOTE — Telephone Encounter (Signed)
The pt states that he became dizzy for a few minutes at breakfast time this morning. Later he checked his pulse and noted that he was back in A-fib. He states that he waited a while before re-checking his pulse as he has gone into a-fib in the past and then converted back to NSR on his own. While we were on the phone the pt had his house keeper check his BP which was 140/60 and his HR is irregular at 64.  The pt wants to know what to do. Please advise.

## 2013-11-28 ENCOUNTER — Ambulatory Visit (INDEPENDENT_AMBULATORY_CARE_PROVIDER_SITE_OTHER): Payer: Medicare PPO

## 2013-11-28 ENCOUNTER — Other Ambulatory Visit (INDEPENDENT_AMBULATORY_CARE_PROVIDER_SITE_OTHER): Payer: Medicare PPO

## 2013-11-28 VITALS — BP 110/90 | HR 93 | Ht 68.0 in | Wt 201.5 lb

## 2013-11-28 DIAGNOSIS — I4891 Unspecified atrial fibrillation: Secondary | ICD-10-CM

## 2013-11-28 LAB — CBC WITH DIFFERENTIAL/PLATELET
Basophils Absolute: 0 10*3/uL (ref 0.0–0.1)
Basophils Relative: 0.2 % (ref 0.0–3.0)
Eosinophils Absolute: 0.5 10*3/uL (ref 0.0–0.7)
Eosinophils Relative: 6.2 % — ABNORMAL HIGH (ref 0.0–5.0)
HEMATOCRIT: 34.7 % — AB (ref 39.0–52.0)
Hemoglobin: 11.8 g/dL — ABNORMAL LOW (ref 13.0–17.0)
Lymphocytes Relative: 31.7 % (ref 12.0–46.0)
Lymphs Abs: 2.4 10*3/uL (ref 0.7–4.0)
MCHC: 34.1 g/dL (ref 30.0–36.0)
MCV: 97.8 fl (ref 78.0–100.0)
MONO ABS: 0.6 10*3/uL (ref 0.1–1.0)
Monocytes Relative: 8.1 % (ref 3.0–12.0)
Neutro Abs: 4.1 10*3/uL (ref 1.4–7.7)
Neutrophils Relative %: 53.8 % (ref 43.0–77.0)
PLATELETS: 218 10*3/uL (ref 150.0–400.0)
RBC: 3.55 Mil/uL — ABNORMAL LOW (ref 4.22–5.81)
RDW: 14.1 % (ref 11.5–14.6)
WBC: 7.5 10*3/uL (ref 4.5–10.5)

## 2013-11-28 LAB — BASIC METABOLIC PANEL
BUN: 20 mg/dL (ref 6–23)
CALCIUM: 8.9 mg/dL (ref 8.4–10.5)
CO2: 26 mEq/L (ref 19–32)
CREATININE: 1.8 mg/dL — AB (ref 0.4–1.5)
Chloride: 108 mEq/L (ref 96–112)
GFR: 38.17 mL/min — AB (ref >60.00)
Glucose, Bld: 99 mg/dL (ref 70–99)
Potassium: 5.1 mEq/L (ref 3.5–5.1)
SODIUM: 137 meq/L (ref 135–145)

## 2013-11-28 LAB — PROTIME-INR
INR: 1.6 ratio — ABNORMAL HIGH (ref 0.8–1.0)
PROTHROMBIN TIME: 16.3 s — AB (ref 10.2–12.4)

## 2013-11-28 NOTE — Progress Notes (Signed)
Patient came to office for EKG.EKG was done and revealed atrial fib with rate 93 beats/min.Dr.Katz was notified and he advised schedule outpatient cardioversion tomorrow 11/29/13.Cardioversion scheduled 11/29/13 at 12:00 noon,patient to arrive at 10:30 am.Patient was given instructions and lab work bmet,cbc,pt was done.

## 2013-11-28 NOTE — H&P (Addendum)
HPI  The patient has a history of atrial fibrillation. He was cardioverted last August, 2013. He is anticoagulated and has been on Multaq.  He has been seen by electrophysiology. The plan has been to continue his current regimen. If over time he has significant return of atrial fibrillation, we will consider Tikosyn or amiodarone. If these were to fail ablation could be considered. However he has not had atrial fib since August, 2013. On November 27, 2013 he called to say that he thought he was back in atrial flutter. He tolerates this but does not feel great. On November 28, 2013 patient was brought in the office for an EKG. It did show atrial fibrillation with a controlled rate. Labs were drawn and plans were made to proceed with cardioversion November 29, 2013.  Allergies  Allergen Reactions  . Guaifenesin & Derivatives   . Penicillins     No current facility-administered medications for this encounter.   Current Outpatient Prescriptions  Medication Sig Dispense Refill  . aspirin 81 MG tablet Take 81 mg by mouth 3 (three) times a week.       Marland Kitchen atorvastatin (LIPITOR) 10 MG tablet Take 1 tablet (10 mg total) by mouth daily.  30 tablet  9  . dabigatran (PRADAXA) 75 MG CAPS Take 1 capsule (75 mg total) by mouth every 12 (twelve) hours.  60 capsule  6  . dronedarone (MULTAQ) 400 MG tablet Take 1 tablet (400 mg total) by mouth 2 (two) times daily with a meal.  60 tablet  6  . methylPREDNISolone (MEDROL DOSEPAK) 4 MG tablet follow package directions  21 tablet  1  . metoprolol tartrate (LOPRESSOR) 25 MG tablet Take 1 tablet (25 mg total) by mouth 2 (two) times daily.  180 tablet  3  . naproxen sodium (ANAPROX) 220 MG tablet Take 220 mg by mouth as needed.       Marland Kitchen omeprazole (PRILOSEC) 20 MG capsule Take 20 mg by mouth. Take 1 tablet Monday, Wednesday, Friday, Saturday        History   Social History  . Marital Status: Widowed    Spouse Name: N/A    Number of Children: N/A  . Years of  Education: N/A   Occupational History  . Not on file.   Social History Main Topics  . Smoking status: Former Research scientist (life sciences)  . Smokeless tobacco: Former Systems developer    Quit date: 08/01/1997  . Alcohol Use: Yes  . Drug Use: No  . Sexual Activity:    Other Topics Concern  . Not on file   Social History Narrative  . No narrative on file    No family history on file.  Past Medical History  Diagnosis Date  . Coronary artery disease     minimal, cath 2005 / catheterization August, 2011, 30% in 3 vessels, normal LV function  . Hyperlipidemia   . Atrial fibrillation 2011    Multaq Started September, 2011, dose adjusted October, 2011, 200 mg a.m., 400 mg p.o.  . Monoclonal gammopathy     granfortuna  . TIA (transient ischemic attack)     Dr. Erling Cruz,, question in the past. when off ASA for 10 days  . GERD (gastroesophageal reflux disease)     Esophageal dilatation in 1992, some esophageal spasm  . S/P transurethral resection of prostate 2004    2004  . MR (mitral regurgitation) 2009    mild, Echo, July, 2011  . Cervical spine disease   . Renal insufficiency  Prior creatinine 1.2 /  September, 2011.. 1.7  /  October, 2011.. 1.8  . Ejection fraction     EF 60%, echo, 2009  /  TEE normal August, 2011 /   EF 60%, echo, July, 2011  . Drug therapy     Pradaxa Started July, 2011  . Aortic stenosis     Mild, echo, July, 2011  . Aortic insufficiency     Mild, echo, July, 2011  . Carotid artery disease     Doppler, 2008 no significant abnormality  . Diarrhea     Chronic  . Incomplete right bundle branch block     August, 2012  . Prostate cancer 11/22/2011  . Painless hematuria 03/27/2012    Occurred x 2 5/12 & 5/13 on Pradaxa 75 mg BID  . Bradycardia     Sinus bradycardia while on 25 Lopressor twice a day October, 2013  . Memory change     June, 2014  . Leg fatigue     June, 2014    Past Surgical History  Procedure Laterality Date  . History of turp    . Hernia repair    . Cataract  extraction    . Fibular fracture repair    . Cardioversion  08/01/2012    Procedure: CARDIOVERSION;  Surgeon: Carlena Bjornstad, MD;  Location: [facility] END[facility]COPY;  Service: Cardiovascular;  Laterality: N/A;    Patient Active Problem List   Diagnosis Date Noted  . Incomplete right bundle branch block     Priority: High  . Atrial fibrillation     Priority: High  . Coronary artery disease     Priority: High  . MR (mitral regurgitation)     Priority: High  . Drug therapy     Priority: High  . Aortic stenosis     Priority: High  . Aortic insufficiency     Priority: High  . Memory change   . Leg fatigue   . Bradycardia   . Carotid artery disease   . Painless hematuria 03/27/2012  . Prostate cancer 11/22/2011  . Hyperlipidemia   . Monoclonal gammopathy   . TIA (transient ischemic attack)   . S/P transurethral resection of prostate   . Cervical spine disease   . Renal insufficiency   . Ejection fraction   . DIARRHEA, CHRONIC 10/14/2010    ASSESSMENT & PLAN  Recurrent atrial fibrillation.      The plan will be to proceed with cardioversion November 29, 2013. If he does not convert to sinus rhythm, or if he does not hold sinus rhythm for prolonged period of time, the most likely course will be to switch him to amiodarone. After he is loaded repeat cardioversion will be done if he does not convert to sinus spontaneously.  11/29/2013:   Patient here and stable. Ready for cardioversion.  Daryel November, MD

## 2013-11-29 ENCOUNTER — Ambulatory Visit (HOSPITAL_COMMUNITY)
Admission: RE | Admit: 2013-11-29 | Discharge: 2013-11-29 | Disposition: A | Discharge status: Home / Self Care | Payer: Medicare PPO | Source: Ambulatory Visit | Attending: Cardiology | Admitting: Cardiology

## 2013-11-29 ENCOUNTER — Encounter (HOSPITAL_COMMUNITY)
Admission: RE | Disposition: A | Discharge status: Home / Self Care | Payer: Self-pay | Source: Ambulatory Visit | Attending: Cardiology

## 2013-11-29 ENCOUNTER — Encounter (HOSPITAL_COMMUNITY): Payer: Self-pay | Admitting: Anesthesiology

## 2013-11-29 ENCOUNTER — Ambulatory Visit (HOSPITAL_COMMUNITY): Payer: Medicare PPO | Admitting: Anesthesiology

## 2013-11-29 ENCOUNTER — Encounter (HOSPITAL_COMMUNITY): Payer: Medicare PPO | Admitting: Anesthesiology

## 2013-11-29 DIAGNOSIS — Z87891 Personal history of nicotine dependence: Secondary | ICD-10-CM | POA: Insufficient documentation

## 2013-11-29 DIAGNOSIS — K219 Gastro-esophageal reflux disease without esophagitis: Secondary | ICD-10-CM | POA: Insufficient documentation

## 2013-11-29 DIAGNOSIS — Z7982 Long term (current) use of aspirin: Secondary | ICD-10-CM | POA: Insufficient documentation

## 2013-11-29 DIAGNOSIS — I4891 Unspecified atrial fibrillation: Secondary | ICD-10-CM | POA: Insufficient documentation

## 2013-11-29 DIAGNOSIS — Z7901 Long term (current) use of anticoagulants: Secondary | ICD-10-CM | POA: Insufficient documentation

## 2013-11-29 HISTORY — PX: CARDIOVERSION: SHX1299

## 2013-11-29 SURGERY — CARDIOVERSION
Anesthesia: Monitor Anesthesia Care

## 2013-11-29 MED ORDER — HYDROCORTISONE 1 % EX CREA: 1.0000 "application " | TOPICAL_CREAM | Freq: Three times a day (TID) | CUTANEOUS | Status: DC | PRN

## 2013-11-29 MED ORDER — PROPOFOL 10 MG/ML IV BOLUS
INTRAVENOUS | Status: DC | PRN
  Administered 2013-11-29: 80 mg via INTRAVENOUS

## 2013-11-29 MED ORDER — SODIUM CHLORIDE 0.9 % IV SOLN
INTRAVENOUS | Status: DC
  Administered 2013-11-29: 12:00:00 via INTRAVENOUS

## 2013-11-29 MED ORDER — HYDROCORTISONE 1 % EX CREA
1.0000 "application " | TOPICAL_CREAM | Freq: Three times a day (TID) | CUTANEOUS | Status: DC | PRN
  Filled 2013-11-29: qty 28

## 2013-11-29 NOTE — CV Procedure (Signed)
Cardioversion:  Patient fully prepped.  Consent signed. Timeout done.  Anesthesia present.  Biphasic defibrillator.  Ant/post pads.  80mg  IV propofol  120joules led to NSR.   Successful crdioversion.  NSR with one shock.  Daryel November, MD

## 2013-11-29 NOTE — Anesthesia Postprocedure Evaluation (Signed)
  Anesthesia Post-op Note  Patient: [name] [name]  Procedure(s) Performed: Procedure(s): CARDIOVERSION (N/A)  Patient Location: PACU  Anesthesia Type:MAC  Level of Consciousness: awake, alert  and oriented  Airway and Oxygen Therapy: Patient Spontanous Breathing  Post-op Pain: none  Post-op Assessment: Post-op Vital signs reviewed, Patient's Cardiovascular Status Stable, Respiratory Function Stable, Patent Airway, No signs of Nausea or vomiting and Pain level controlled  Post-op Vital Signs: Reviewed and stable  Complications: No apparent anesthesia complications

## 2013-11-29 NOTE — Preoperative (Signed)
Beta Blockers   Reason not to administer Beta Blockers:Not Applicable 

## 2013-11-29 NOTE — Discharge Instructions (Addendum)
Electrical Cardioversion Electrical cardioversion is the delivery of a jolt of electricity to change the rhythm of the heart. Sticky patches or metal paddles are placed on the chest to deliver the electricity from a device. This is done to restore a normal rhythm. A rhythm that is too fast or not regular keeps the heart from pumping well. Electrical cardioversion is done in an emergency if:   There is low or no blood pressure as a result of the heart rhythm.   Normal rhythm must be restored as fast as possible to protect the brain and heart from further damage.   It may save a life. Cardioversion may be done for heart rhythms that are not immediately life-threatening, such as atrial fibrillation or flutter, in which:   The heart is beating too fast or is not regular.   Medicine to change the rhythm has not worked.   It is safe to wait in order to allow time for preparation.  Symptoms of the abnormal rhythm are bothersome.  The risk of stroke and other serious complications can be reduced. LET YOUR CAREGIVER KNOW ABOUT:   All medicines you are taking, including vitamins, herbs, eye drops, creams, and over-the-counter medicines.   Previous problems you or members of your family have had with the use of anesthetics.   Any blood disorders you have.   Previous surgeries you have had.   Medical conditions you have. RISKS AND COMPLICATIONS  Generally, this is a safe procedure. However, as with any procedure, complications can occur. Possible complications include:   Breathing problems related to the anesthetic used.  Cardiac arrest This risk is rare.  A blood clot that breaks free and travels to other parts of your body. This could cause a stroke or other problems. The risk of this is lowered by use of blood thinning medicine (anticoagulant) prior to the procedure. BEFORE THE PROCEDURE   You may have tests to detect blood clots in your heart and evaluate heart  function.  You may start taking anticoagulants so your blood does not clot as easily.   Medicines may be given to help stabilize your heart rate and rhythm. PROCEDURE  You will be given medicine through an IV tube to reduce discomfort and make you sleepy (sedative).   An electrical shock will be delivered. AFTER THE PROCEDURE Your heart rhythm will be watched to make sure it does not change.You may be able to go home within a few hours.  Document Released: 10/22/2002 Document Revised: 08/22/2013 Document Reviewed: 05/16/2013 [facility] Patient Information 2014 [place].   Monitored Anesthesia Care  Monitored anesthesia care is an anesthesia service for a medical procedure. Anesthesia is the loss of the ability to feel pain. It is produced by medications called anesthetics. It may affect a small area of your body (local anesthesia), a large area of your body (regional anesthesia), or your entire body (general anesthesia). The need for monitored anesthesia care depends your procedure, your condition, and the potential need for regional or general anesthesia. It is often provided during procedures where:   General anesthesia may be needed if there are complications. This is because you need special care when you are under general anesthesia.   You will be under local or regional anesthesia. This is so that you are able to have higher levels of anesthesia if needed.   You will receive calming medications (sedatives). This is especially the case if sedatives are given to put you in a semi-conscious state of relaxation (  deep sedation). This is because the amount of sedative needed to produce this state can be hard to predict. Too much of a sedative can produce general anesthesia. °Monitored anesthesia care is performed by one or more caregivers who have special training in all types of anesthesia. You will need to meet with these caregivers before your procedure. During this meeting, they  will ask you about your medical history. They will also give you instructions to follow. (For example, you will need to stop eating and drinking before your procedure. You may also need to stop or change medications you are taking.) During your procedure, your caregivers will stay with you. They will:  °· Watch your condition. This includes watching you blood pressure, breathing, and level of pain.   °· Diagnose and treat problems that occur.   °· Give medications if they are needed. These may include calming medications (sedatives) and anesthetics.   °· Make sure you are comfortable.   °Having monitored anesthesia care does not necessarily mean that you will be under anesthesia. It does mean that your caregivers will be able to manage anesthesia if you need it or if it occurs. It also means that you will be able to have a different type of anesthesia than you are having if you need it. When your procedure is complete, your caregivers will continue to watch your condition. They will make sure any medications wear off before you are allowed to go home.  °Document Released: 07/28/2005 Document Revised: 02/26/2013 Document Reviewed: 12/13/2012 °[employer]® Patient Information ©2014 [employer]. ° °

## 2013-11-29 NOTE — Transfer of Care (Signed)
Immediate Anesthesia Transfer of Care Note  Patient: [name] [name]  Procedure(s) Performed: Procedure(s): CARDIOVERSION (N/A)  Patient Location: PACU  Anesthesia Type:MAC  Level of Consciousness: awake and alert   Airway & Oxygen Therapy: Patient Spontanous Breathing and Patient connected to nasal cannula oxygen  Post-op Assessment: Report given to PACU RN and Post -op Vital signs reviewed and stable  Post vital signs: Reviewed and stable  Complications: No apparent anesthesia complications

## 2013-11-29 NOTE — Anesthesia Preprocedure Evaluation (Addendum)
Anesthesia Evaluation    Airway       Dental   Pulmonary former smoker,          Cardiovascular     Neuro/Psych    GI/Hepatic   Endo/Other    Renal/GU      Musculoskeletal   Abdominal   Peds  Hematology   Anesthesia Other Findings   Reproductive/Obstetrics                          Anesthesia Physical Anesthesia Plan  ASA: III  Anesthesia Plan: MAC   Post-op Pain Management:    Induction: Intravenous  Airway Management Planned: Mask  Additional Equipment:   Intra-op Plan:   Post-operative Plan:   Informed Consent:   Plan Discussed with:   Anesthesia Plan Comments:         Anesthesia Quick Evaluation

## 2013-11-30 ENCOUNTER — Encounter (HOSPITAL_COMMUNITY): Payer: Self-pay | Admitting: Cardiology

## 2013-11-30 ENCOUNTER — Telehealth: Payer: Self-pay

## 2013-11-30 NOTE — Telephone Encounter (Signed)
**Note De-Identified  Obfuscation** Per Dr Ron Parker the pt needs to f/u after his cardioversion. The pt scheduled for 12/24/13.

## 2013-12-03 ENCOUNTER — Other Ambulatory Visit: Payer: Self-pay | Admitting: *Deleted

## 2013-12-03 MED ORDER — DABIGATRAN ETEXILATE MESYLATE 75 MG PO CAPS: 75.0000 mg | ORAL_CAPSULE | Freq: Two times a day (BID) | ORAL | Status: DC

## 2013-12-11 ENCOUNTER — Encounter: Payer: Self-pay | Admitting: *Deleted

## 2013-12-11 ENCOUNTER — Other Ambulatory Visit: Payer: Self-pay | Admitting: *Deleted

## 2013-12-11 ENCOUNTER — Telehealth: Payer: Self-pay | Admitting: *Deleted

## 2013-12-11 MED ORDER — OSELTAMIVIR PHOSPHATE 75 MG PO CAPS: 75.0000 mg | ORAL_CAPSULE | Freq: Two times a day (BID) | ORAL | Status: DC

## 2013-12-11 MED ORDER — AZITHROMYCIN 250 MG PO TABS: ORAL_TABLET | ORAL | Status: DC

## 2013-12-11 MED ORDER — AMOXICILLIN-POT CLAVULANATE 875-125 MG PO TABS: 1.0000 | ORAL_TABLET | Freq: Two times a day (BID) | ORAL | Status: DC

## 2013-12-11 NOTE — Telephone Encounter (Signed)
Notified Dr. [name] that Dr. Beryle Beams prescribed him Z-pak and Tamiflu; it has been sent to pt pharmacy.  Dr. [name] verbalized understanding and expressed appreciation for call back.

## 2013-12-11 NOTE — Telephone Encounter (Signed)
Spoke with pharmacist at [facility]. Zpack can cause severe qt prolongation. Dr Beryle Beams changed order to Augmentin.

## 2013-12-11 NOTE — Telephone Encounter (Signed)
Received call from pt stating; "I've had a cold that has turned into bronchitis; now have productive cough bringing up greenish-yellow pus; yesterday 12/10/13 had 3 episodes of shaking chills but have no fever today.  Could you ask Dr. Beryle Beams to possible call in an antibiotic?"  Note to Dr. Beryle Beams.

## 2013-12-21 ENCOUNTER — Encounter: Payer: Self-pay | Admitting: Cardiology

## 2013-12-24 ENCOUNTER — Encounter: Payer: Self-pay | Admitting: Cardiology

## 2013-12-24 ENCOUNTER — Ambulatory Visit (INDEPENDENT_AMBULATORY_CARE_PROVIDER_SITE_OTHER): Payer: Medicare PPO | Admitting: Cardiology

## 2013-12-24 VITALS — BP 128/60 | HR 52 | Ht 68.0 in | Wt 195.0 lb

## 2013-12-24 DIAGNOSIS — G459 Transient cerebral ischemic attack, unspecified: Secondary | ICD-10-CM

## 2013-12-24 DIAGNOSIS — I4891 Unspecified atrial fibrillation: Secondary | ICD-10-CM

## 2013-12-24 DIAGNOSIS — I350 Nonrheumatic aortic (valve) stenosis: Secondary | ICD-10-CM

## 2013-12-24 DIAGNOSIS — L989 Disorder of the skin and subcutaneous tissue, unspecified: Secondary | ICD-10-CM

## 2013-12-24 DIAGNOSIS — I359 Nonrheumatic aortic valve disorder, unspecified: Secondary | ICD-10-CM

## 2013-12-24 NOTE — Assessment & Plan Note (Signed)
Patient underwent elective cardioversion on November 29, 2013. Successful with one shock. His last cardioversion prior to that was September, 2013. He had seen Dr. Rayann Heman for consultation October, 2013. The plan then was to continue the same therapy. If he had more episodes we would consider Tikosyn or more probably amiodarone. However the patient's episodes have been very infrequent. We will continue the same approach at this point. If he has more episodes we will consider a change in therapy.

## 2013-12-24 NOTE — Patient Instructions (Signed)
Your physician recommends that you continue on your current medications as directed. Please refer to the Current Medication list given to you today.  Your physician wants you to follow-up in: 6 months with Dr. Katz.  You will receive a reminder letter in the mail two months in advance. If you don't receive a letter, please call our office to schedule the follow-up appointment.  

## 2013-12-24 NOTE — Assessment & Plan Note (Signed)
There is question of a TIA in the past during a 10 day. When he was off aspirin. This is long before we documented atrial fibrillation. With this history the patient wants to remain on aspirin. Therefore his aspirin is continued at low dose along with his anticoagulant

## 2013-12-24 NOTE — Progress Notes (Signed)
HPI  The patient is seen today to followup atrial fibrillation. I saw him last in the office September, 2014. More recently in January, 2015 he called to say that he had had return of atrial fibrillation. We brought him in for an outpatient cardioversion that was successful with one shock on November 29, 2013. Since that time, he is actually had a significant upper respiratory infection. He is still recovering from this. Despite this stress, he has not had return of atrial fibrillation. Overall he is doing well.  Allergies  Allergen Reactions  . Guaifenesin & Derivatives   . Penicillins     Current Outpatient Prescriptions  Medication Sig Dispense Refill  . aspirin 81 MG tablet Take 81 mg by mouth 3 (three) times a week.       Marland Kitchen atorvastatin (LIPITOR) 10 MG tablet Take 1 tablet (10 mg total) by mouth daily.  30 tablet  9  . dabigatran (PRADAXA) 75 MG CAPS capsule Take 1 capsule (75 mg total) by mouth every 12 (twelve) hours.  60 capsule  5  . dronedarone (MULTAQ) 400 MG tablet Take 1 tablet (400 mg total) by mouth 2 (two) times daily with a meal.  60 tablet  6  . hydrocortisone cream 1 % Apply 1 application topically 3 (three) times daily as needed for itching (skin irritation).  30 g  0  . metoprolol tartrate (LOPRESSOR) 25 MG tablet Take 1 tablet (25 mg total) by mouth 2 (two) times daily.  180 tablet  3  . naproxen sodium (ANAPROX) 220 MG tablet Take 220 mg by mouth as needed.       Marland Kitchen omeprazole (PRILOSEC) 20 MG capsule Take 20 mg by mouth. Take 1 tablet Monday, Wednesday, Friday, Saturday      . oseltamivir (TAMIFLU) 75 MG capsule Take 1 capsule (75 mg total) by mouth 2 (two) times daily.  10 capsule  0  . amoxicillin-clavulanate (AUGMENTIN) 875-125 MG per tablet Take 1 tablet by mouth 2 (two) times daily.  10 tablet  1   No current facility-administered medications for this visit.    History   Social History  . Marital Status: Widowed    Spouse Name: N/A    Number of Children:  N/A  . Years of Education: N/A   Occupational History  . Not on file.   Social History Main Topics  . Smoking status: Former Games developer  . Smokeless tobacco: Former Neurosurgeon    Quit date: 08/01/1997  . Alcohol Use: Yes  . Drug Use: No  . Sexual Activity:    Other Topics Concern  . Not on file   Social History Narrative  . No narrative on file    No family history on file.  Past Medical History  Diagnosis Date  . Coronary artery disease     minimal, cath 2005 / catheterization August, 2011, 30% in 3 vessels, normal LV function  . Hyperlipidemia   . Atrial fibrillation 2011    Multaq Started September, 2011, dose adjusted October, 2011, 200 mg a.m., 400 mg p.o.  . Monoclonal gammopathy     granfortuna  . TIA (transient ischemic attack)     Dr. Sandria Manly,, question in the past. when off ASA for 10 days  . GERD (gastroesophageal reflux disease)     Esophageal dilatation in 1992, some esophageal spasm  . S/P transurethral resection of prostate 2004    2004  . MR (mitral regurgitation) 2009    mild, Echo, July, 2011  .  Cervical spine disease   . Renal insufficiency     Prior creatinine 1.2 /  September, 2011.. 1.7  /  October, 2011.. 1.8  . Ejection fraction     EF 60%, echo, 2009  /  TEE normal August, 2011 /   EF 60%, echo, July, 2011  . Drug therapy     Pradaxa Started July, 2011  . Aortic stenosis     Mild, echo, July, 2011  . Aortic insufficiency     Mild, echo, July, 2011  . Carotid artery disease     Doppler, 2008 no significant abnormality  . Diarrhea     Chronic  . Incomplete right bundle branch block     August, 2012  . Prostate cancer 11/22/2011  . Painless hematuria 03/27/2012    Occurred x 2 5/12 & 5/13 on Pradaxa 75 mg BID  . Bradycardia     Sinus bradycardia while on 25 Lopressor twice a day October, 2013  . Memory change     June, 2014  . Leg fatigue     June, 2014    Past Surgical History  Procedure Laterality Date  . History of turp    . Hernia  repair    . Cataract extraction    . Fibular fracture repair    . Cardioversion  08/01/2012    Procedure: CARDIOVERSION;  Surgeon: Carlena Bjornstad, MD;  Location: [facility] ENDOSCOPY;  Service: Cardiovascular;  Laterality: N/A;  . Cardioversion N/A 11/29/2013    Procedure: CARDIOVERSION;  Surgeon: Carlena Bjornstad, MD;  Location: [facility] ENDOSCOPY;  Service: Cardiovascular;  Laterality: N/A;    Patient Active Problem List   Diagnosis Date Noted  . Incomplete right bundle branch block     Priority: High  . Atrial fibrillation     Priority: High  . Coronary artery disease     Priority: High  . MR (mitral regurgitation)     Priority: High  . Drug therapy     Priority: High  . Aortic stenosis     Priority: High  . Aortic insufficiency     Priority: High  . Memory change   . Leg fatigue   . Bradycardia   . Carotid artery disease   . Painless hematuria 03/27/2012  . Prostate cancer 11/22/2011  . Hyperlipidemia   . Monoclonal gammopathy   . TIA (transient ischemic attack)   . S/P transurethral resection of prostate   . Cervical spine disease   . Renal insufficiency   . Ejection fraction   . DIARRHEA, CHRONIC 10/14/2010    ROS   Patient denies fever, chills, headache, sweats, rash, change in vision, change in hearing, chest pain, cough, nausea vomiting, urinary symptoms. He has a an area of itching that occurs in the center of his back with a small skin lesion. All other systems are reviewed and are negative.  PHYSICAL EXAM  Patient is oriented to person time and place. Affect is normal. There is no jugulovenous distention. Lungs are clear. Respiratory effort is nonlabored. Cardiac reveals soft systolic murmur. This is consistent with his aortic valve sclerosis. Abdomen is soft. Is no peripheral edema. He has a very small skin lesion the center of his back less than 1 cm in diameter. It is red. There is only an extremely small area of scaling. Abdomen is soft. There is no peripheral  edema.  Filed Vitals:   12/24/13 0916  BP: 128/60  Pulse: 52  Height: 5\' 8"  (1.727 m)  Weight: 195 lb (  88.451 kg)     ASSESSMENT & PLAN

## 2013-12-24 NOTE — Assessment & Plan Note (Signed)
He has very mild aortic stenosis and very mild aortic insufficiency. No further workup is needed at this time.

## 2013-12-24 NOTE — Assessment & Plan Note (Signed)
He has a small skin lesion on his back. I am not comfortable making any definitive diagnosis. He will be following up with his dermatologist.

## 2014-01-11 ENCOUNTER — Telehealth: Payer: Self-pay | Admitting: Cardiology

## 2014-01-11 NOTE — Telephone Encounter (Signed)
F/u ° ° ° °Pt calling concerning previous message. °

## 2014-01-11 NOTE — Telephone Encounter (Signed)
Will forward to Dr. Katz 

## 2014-01-11 NOTE — Telephone Encounter (Signed)
Spoke with pt, he is needing to have right shoulder surgery and anxious about getting it scheduled. Dr Ron Parker will need to talk to dr Noemi Chapel. Pt aware dr Ron Parker is not here and will be able to make the call as soon as he returns. Patient voiced understanding will forward to dr Ron Parker

## 2014-01-11 NOTE — Telephone Encounter (Signed)
New message    Dr. Noemi Chapel is aware Dr. Ron Parker is off this week. Would like for him to call him next week regarding patient.

## 2014-01-13 ENCOUNTER — Encounter: Payer: Self-pay | Admitting: Oncology

## 2014-01-14 ENCOUNTER — Other Ambulatory Visit (HOSPITAL_BASED_OUTPATIENT_CLINIC_OR_DEPARTMENT_OTHER): Payer: Medicare PPO

## 2014-01-14 DIAGNOSIS — D472 Monoclonal gammopathy: Secondary | ICD-10-CM

## 2014-01-14 LAB — CBC WITH DIFFERENTIAL/PLATELET
BASO%: 0.4 % (ref 0.0–2.0)
BASOS ABS: 0 10*3/uL (ref 0.0–0.1)
EOS%: 4 % (ref 0.0–7.0)
Eosinophils Absolute: 0.3 10*3/uL (ref 0.0–0.5)
HEMATOCRIT: 36.5 % — AB (ref 38.4–49.9)
HEMOGLOBIN: 12.1 g/dL — AB (ref 13.0–17.1)
LYMPH#: 2.6 10*3/uL (ref 0.9–3.3)
LYMPH%: 34.8 % (ref 14.0–49.0)
MCH: 33.2 pg (ref 27.2–33.4)
MCHC: 33.3 g/dL (ref 32.0–36.0)
MCV: 99.8 fL — AB (ref 79.3–98.0)
MONO#: 0.7 10*3/uL (ref 0.1–0.9)
MONO%: 9.5 % (ref 0.0–14.0)
NEUT#: 3.8 10*3/uL (ref 1.5–6.5)
NEUT%: 51.3 % (ref 39.0–75.0)
PLATELETS: 199 10*3/uL (ref 140–400)
RBC: 3.66 10*6/uL — ABNORMAL LOW (ref 4.20–5.82)
RDW: 13.7 % (ref 11.0–14.6)
WBC: 7.4 10*3/uL (ref 4.0–10.3)

## 2014-01-14 LAB — COMPREHENSIVE METABOLIC PANEL (CC13)
ALT: 20 U/L (ref 0–55)
AST: 21 U/L (ref 5–34)
Albumin: 3.4 g/dL — ABNORMAL LOW (ref 3.5–5.0)
Alkaline Phosphatase: 45 U/L (ref 40–150)
Anion Gap: 6 mEq/L (ref 3–11)
BILIRUBIN TOTAL: 0.71 mg/dL (ref 0.20–1.20)
BUN: 16.7 mg/dL (ref 7.0–26.0)
CALCIUM: 9.4 mg/dL (ref 8.4–10.4)
CHLORIDE: 108 meq/L (ref 98–109)
CO2: 25 mEq/L (ref 22–29)
CREATININE: 1.7 mg/dL — AB (ref 0.7–1.3)
Glucose: 98 mg/dl (ref 70–140)
Potassium: 4.8 mEq/L (ref 3.5–5.1)
Sodium: 139 mEq/L (ref 136–145)
Total Protein: 7.8 g/dL (ref 6.4–8.3)

## 2014-01-15 ENCOUNTER — Encounter: Payer: Self-pay | Admitting: Cardiology

## 2014-01-15 NOTE — Telephone Encounter (Signed)
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 Noemi Chapel and cleared him for shoulder surgery.

## 2014-01-15 NOTE — Progress Notes (Signed)
The patient has a localized rotator cuff tear. He is very active. This is keeping him from golfing, which is his favorite activity. He does not have significant coronary disease. Left ventricular function is good. He has mild aortic stenosis. He has paroxysmal atrial fibrillation. When he is in atrial fibrillation his rate is controlled. He wants to have shoulder surgery for quality of life. There is no reason from a cardiac basis to argue against his wishes.  I've spoken directly with Dr. Noemi Chapel. The patient is cleared for shoulder surgery. His Pradaxa can be held for 5 days before the procedure. I would like for her to be restarted as soon after the procedure as it is felt to be safe.  Daryel November, MD

## 2014-01-16 LAB — IMMUNOFIXATION ELECTROPHORESIS
IGG (IMMUNOGLOBIN G), SERUM: 2120 mg/dL — AB (ref 650–1600)
IgA: 129 mg/dL (ref 68–379)
IgM, Serum: 99 mg/dL (ref 41–251)
Total Protein, Serum Electrophoresis: 7.5 g/dL (ref 6.0–8.3)

## 2014-01-16 NOTE — Telephone Encounter (Signed)
Follow up     Talk to the nurse regarding clearance for shoulder surgery

## 2014-01-16 NOTE — Telephone Encounter (Signed)
Notified [name] [name] (patient) that Dr. Ron Parker did speak to Dr. Noemi Chapel to clear him for surgery. Patient stated he knew this but was awaiting actual surgery date. Patient asked if I could call Judeen Hammans at Dr. Archie Endo office to check on it. I spoke to [facility] at Dr. Archie Endo office. She stated they did not need anything further from Dr. Ron Parker office, as they were only waiting on an OR room to open up for the date selected. Judeen Hammans stated she would call [name] [name] back to inform him of the hold-up with scheduling.

## 2014-01-17 ENCOUNTER — Telehealth: Payer: Self-pay | Admitting: *Deleted

## 2014-01-17 ENCOUNTER — Other Ambulatory Visit: Payer: Self-pay | Admitting: Physician Assistant

## 2014-01-17 NOTE — Telephone Encounter (Signed)
Notified pt of lab results.  He was able to view these on MyChart.  He wanted to notify Dr Beryle Beams that he is having surgery Wed 01/23/14.  Note to Dr Beryle Beams.

## 2014-01-17 NOTE — Telephone Encounter (Signed)
Message copied by Jesse Fall on Thu Jan 17, 2014  4:54 PM ------      Message from: Annia Belt      Created: Wed Jan 16, 2014  3:24 PM       Call pt: lab released for his review - everything stable ------

## 2014-01-18 ENCOUNTER — Encounter (HOSPITAL_COMMUNITY): Payer: Self-pay | Admitting: Pharmacy Technician

## 2014-01-20 ENCOUNTER — Encounter: Payer: Self-pay | Admitting: Physician Assistant

## 2014-01-20 ENCOUNTER — Other Ambulatory Visit: Payer: Self-pay | Admitting: Physician Assistant

## 2014-01-20 DIAGNOSIS — K219 Gastro-esophageal reflux disease without esophagitis: Secondary | ICD-10-CM | POA: Insufficient documentation

## 2014-01-20 NOTE — H&P (Signed)
[name] [name] is an 80 y.o. male.   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 He has pain when he plays golf and with overuse. He also has right medial elbow pain without any specific injury, intermittent sharp pain in the ulnar groove.   Past Medical History  Diagnosis Date  . Coronary artery disease     minimal, cath 2005 / catheterization August, 2011, 30% in 3 vessels, normal LV function  . Hyperlipidemia   . Atrial fibrillation 2011, 2015    Multaq Started September, 2011, dose adjusted October, 2011, 200 mg a.m., 400 mg p.o.  . Monoclonal gammopathy     granfortuna  . TIA (transient ischemic attack)     Dr. Erling Cruz,, question in the past. when off ASA for 10 days  . GERD (gastroesophageal reflux disease)     Esophageal dilatation in 1992, some esophageal spasm  . S/P transurethral resection of prostate 2004    2004  . MR (mitral regurgitation) 2009    mild, Echo, July, 2011  . Cervical spine disease   . Renal insufficiency     Prior creatinine 1.2 /  September, 2011.. 1.7  /  October, 2011.. 1.8  . Ejection fraction     EF 60%, echo, 2009  /  TEE normal August, 2011 /   EF 60%, echo, July, 2011  . Drug therapy     Pradaxa Started July, 2011  . Aortic stenosis     Mild, echo, July, 2011  . Aortic insufficiency     Mild, echo, July, 2011  . Carotid artery disease     Doppler, 2008 no significant abnormality  . Diarrhea     Chronic  . Incomplete right bundle branch block     August, 2012  . Prostate cancer 11/22/2011  . Painless hematuria 03/27/2012    Occurred x 2 5/12 & 5/13 on Pradaxa 75 mg BID  . Bradycardia     Sinus bradycardia while on 25 Lopressor twice a day October, 2013  . Memory change     June, 2014  . Leg fatigue     June, 2014    Past Surgical History  Procedure Laterality Date  . History of turp    . Hernia repair    . Cataract  extraction    . Fibular fracture repair    . Cardioversion  08/01/2012    Procedure: CARDIOVERSION;  Surgeon: Carlena Bjornstad, MD;  Location: [facility] ENDOSCOPY;  Service: Cardiovascular;  Laterality: N/A;  . Cardioversion N/A 11/29/2013    Procedure: CARDIOVERSION;  Surgeon: Carlena Bjornstad, MD;  Location: [facility] ENDOSCOPY;  Service: Cardiovascular;  Laterality: N/A;    No family history on file. Social History:  reports that he has quit smoking. He quit smokeless tobacco use about 16 years ago. He reports that he drinks alcohol. He reports that he does not use illicit drugs.  Allergies:  Allergies  Allergen Reactions  . Guaifenesin & Derivatives   . Penicillins      (Not in a hospital admission)  No results found for this or any previous visit (from the past 48 hour(s)). No results found.  Review of Systems  Constitutional: Negative.   HENT: Negative.   Eyes: Negative.   Respiratory: Negative.   Cardiovascular: Negative.   Gastrointestinal: Negative.   Genitourinary: Negative.   Musculoskeletal: Positive for joint pain.  Shoulder  Skin: Negative.   Neurological: Negative.   Endo/Heme/Allergies: Negative.   Psychiatric/Behavioral: Negative.     Height 5' 9" (1.753 m), weight 88.451 kg (195 lb). Physical Exam  Constitutional: He is oriented to person, place, and time. He appears well-developed and well-nourished.  HENT:  Head: Normocephalic.  Eyes: Conjunctivae are normal. Pupils are equal, round, and reactive to light.  Neck: Neck supple.  Cardiovascular: Normal rate.   Respiratory: Effort normal.  GI: Soft.  Genitourinary:  Not pertinent to current symptomatology therefore not examined.  Musculoskeletal:  Examination of his right shoulder reveals anterior biceps pain. No posterior pain range of motion 80% of normal with pain at the extremes no instability. Exam of his left shoulder reveals full range of motion without pain swelling weakness or instability. Exam of the  right elbow reveals pain in the ulnar groove he has no swelling, he has negative Tinel's sign no pain over the medial epicondyle full range of motion elbow is stable. Exam of the left elbow reveals full range of motion without pain swelling weakness or instability. Vascular exam: pulses 2+ and symmetric.  Neurological: He is alert and oriented to person, place, and time.  Skin: Skin is warm and dry.  Psychiatric: He has a normal mood and affect. His behavior is normal.     Assessment  Right shouder rotator cuff tear  Patient Active Problem List   Diagnosis Date Noted  . GERD (gastroesophageal reflux disease)   . Skin lesion of back 12/24/2013  . Memory change   . Leg fatigue   . Bradycardia   . Carotid artery disease   . Painless hematuria 03/27/2012  . Prostate cancer 11/22/2011  . Incomplete right bundle branch block   . Atrial fibrillation   . Coronary artery disease   . Hyperlipidemia   . Monoclonal gammopathy   . TIA (transient ischemic attack)   . S/P transurethral resection of prostate   . MR (mitral regurgitation)   . Cervical spine disease   . Renal insufficiency   . Ejection fraction   . Drug therapy   . Aortic stenosis   . Aortic insufficiency   . DIARRHEA, CHRONIC 10/14/2010    Plan I spoke to Dr. Mctague concerning his right shoulder MRI that revealed a complete rotator cuff tear with biceps tendinopathy with impingement and AC joint arthropathy. We injected his right shoulder on 12/03/13 which helped for 24 hours and then pain recurred. He has pain on a daily basis and pain at night and in the morning and pain playing golf which he loves to do. His primary care physician is Dr. Granfortuna and is followed for his monoclonal gammopathy. Also followed by Dr. Katz of cardiology for his atrial fibrillation. He had cardioversion a month ago and is doing well. Current medications: aspirin, Pradaxia, Prilosec, Metoprolol, Lipitor and Naproxen. Drug allergy to penicillin.   I told him with these findings his significant persistent pain and lack of response to conservative care he may need a right shoulder arthroscopy with rotator cuff repair subacromial decompression and distal clavicle excision. Discussed risks benefits and possible complications in detail and he understands this completely. He will need preoperative clearance from Dr. Katz and Dr. Granfortuna. He has limited help at home and will need to arrange for this versus potentially a temporary stay in a rehab facility. We will set him up for this when his medical and social issues are addressed.  Kendrew Paci J 01/20/2014, 11:15 AM    

## 2014-01-21 ENCOUNTER — Ambulatory Visit (HOSPITAL_BASED_OUTPATIENT_CLINIC_OR_DEPARTMENT_OTHER): Payer: Medicare PPO | Admitting: Oncology

## 2014-01-21 ENCOUNTER — Encounter (HOSPITAL_COMMUNITY)
Admission: RE | Admit: 2014-01-21 | Discharge: 2014-01-21 | Disposition: A | Discharge status: Home / Self Care | Payer: Medicare PPO | Source: Ambulatory Visit | Attending: Orthopedic Surgery | Admitting: Orthopedic Surgery

## 2014-01-21 ENCOUNTER — Encounter (HOSPITAL_COMMUNITY)
Admission: RE | Admit: 2014-01-21 | Discharge: 2014-01-21 | Disposition: A | Discharge status: Home / Self Care | Payer: Medicare PPO | Source: Ambulatory Visit | Attending: Physician Assistant | Admitting: Physician Assistant

## 2014-01-21 ENCOUNTER — Encounter (HOSPITAL_COMMUNITY): Payer: Self-pay

## 2014-01-21 ENCOUNTER — Other Ambulatory Visit (HOSPITAL_COMMUNITY): Payer: Self-pay | Admitting: *Deleted

## 2014-01-21 VITALS — BP 172/66 | HR 55 | Temp 97.9°F | Resp 18 | Ht 69.0 in | Wt 200.4 lb

## 2014-01-21 DIAGNOSIS — D472 Monoclonal gammopathy: Secondary | ICD-10-CM

## 2014-01-21 DIAGNOSIS — C61 Malignant neoplasm of prostate: Secondary | ICD-10-CM

## 2014-01-21 DIAGNOSIS — I4891 Unspecified atrial fibrillation: Secondary | ICD-10-CM

## 2014-01-21 DIAGNOSIS — Z8673 Personal history of transient ischemic attack (TIA), and cerebral infarction without residual deficits: Secondary | ICD-10-CM

## 2014-01-21 DIAGNOSIS — E785 Hyperlipidemia, unspecified: Secondary | ICD-10-CM

## 2014-01-21 DIAGNOSIS — K219 Gastro-esophageal reflux disease without esophagitis: Secondary | ICD-10-CM

## 2014-01-21 LAB — PROTIME-INR
INR: 1.21 (ref 0.00–1.49)
Prothrombin Time: 15 seconds (ref 11.6–15.2)

## 2014-01-21 LAB — APTT: aPTT: 28 seconds (ref 24–37)

## 2014-01-21 NOTE — Progress Notes (Signed)
Pt had CBC w/ diff and CMET drawn on 01/14/14. He was adamant that he had PT-INR drawn that day also, but there are no results noted in EPIC.   Cardiac clearance in EPIC by Dr. Ron Parker.   Pt saw Dr. Beryle Beams today prior to PAT visit.

## 2014-01-21 NOTE — Progress Notes (Signed)
Hematology and Oncology Follow Up Visit  [name] [name] [id number] [birth year] 80 y.o. 01/21/2014 8:00 PM   Principle Diagnosis: Encounter Diagnoses  Name Primary?  . Prostate cancer Yes  . 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  Main active problem relates to his atrial fibrillation. Initial onset in 2011. He has now had 3 cardioversion procedures most recent 11/29/2013. He is back in sinus rhythm.  He is planning to have right shoulder arthroscopy and rotator cuff repair by Dr. Elsie Saas this week on March 11. I advised him to hold his Pradaxa for 4 days in view of his low creatinine clearance. Resume Pradaxa the day after surgery.  He had an episode of bronchitis last month which responded to oral antibiotics as an outpatient. No other interim medical problems.   Medications: reviewed  Allergies:  Allergies  Allergen Reactions  . Guaifenesin & Derivatives   . Penicillins     Review of Systems: Hematology:  No bleeding or bruising ENT ROS: No sore throat Breast ROS:  Respiratory ROS: Resolved bronchitis Cardiovascular ROS:  No ischemic type chest pain or palpitations Gastrointestinal ROS: No abdominal pain or change in bowel habit. Last colonoscopy 6 years ago was normal and he was told he does not need any further studies.   Genito-Urinary ROS: No urinary tract symptoms Musculoskeletal ROS: Right shoulder discomfort Neurological ROS: No headache or change in vision Dermatological ROS: No rash Remaining ROS negative:   Physical Exam: Blood pressure 172/66, pulse 55, temperature 97.9 F (36.6 C), temperature source Oral, resp. rate 18, height 5\' 9"  (1.753 m), weight 200 lb 6.4 oz (90.901 kg), SpO2  100.00%. Wt Readings from Last 3 Encounters:  01/21/14 200 lb 6.4 oz (90.901 kg)  01/21/14 200 lb 6.4 oz (90.901 kg)  01/20/14 195 lb (88.451 kg)     General appearance: Well-nourished Caucasian man HENNT: Pharynx no erythema, exudate, mass, or ulcer. No thyromegaly or thyroid nodules Lymph nodes: No cervical, supraclavicular adenopathy; persistent approximate 2 cm deep lymph node in the left axilla unchanged and they approximate 1 cm lymph node in the right axilla that I don't recall from prior exams. Breasts:  Lungs: Clear to auscultation, resonant to percussion throughout Heart: Regular rhythm, 6-1/6 aortic systolic murmur transmitted to the left sternal border , no gallop, no rub, no click, no edema Abdomen: Soft, nontender, normal bowel sounds, no mass, no organomegaly Extremities: No edema, no calf tenderness Musculoskeletal: no joint deformities GU:  Vascular: Carotid pulses 2+, no bruits, Neurologic: Alert, oriented, PERRLA, , cranial nerves grossly normal, motor strength 5 over 5, reflexes 1+ symmetric, upper body coordination normal, gait normal, sensation intact to vibration over the fingertips by tuning fork exam Skin: No rash or ecchymosis  Lab Results: CBC W/Diff    Component Value Date/Time   WBC 7.4 01/14/2014 0804   WBC 7.5 11/28/2013 0948   RBC 3.66* 01/14/2014 0804   RBC 3.55* 11/28/2013 0948   HGB 12.1* 01/14/2014 0804   HGB 11.8* 11/28/2013 0948   HCT 36.5* 01/14/2014 0804   HCT 34.7* 11/28/2013 0948   PLT 199 01/14/2014 0804   PLT 218.0 11/28/2013 0948   MCV 99.8* 01/14/2014 0804   MCV 97.8 11/28/2013 0948   MCH 33.2 01/14/2014 0804   MCH 32.9 06/18/2010  0423   MCHC 33.3 01/14/2014 0804   MCHC 34.1 11/28/2013 0948   RDW 13.7 01/14/2014 0804   RDW 14.1 11/28/2013 0948   LYMPHSABS 2.6 01/14/2014 0804   LYMPHSABS 2.4 11/28/2013 0948   MONOABS 0.7 01/14/2014 0804   MONOABS 0.6 11/28/2013 0948   EOSABS 0.3 01/14/2014 0804   EOSABS 0.5 11/28/2013 0948   BASOSABS 0.0 01/14/2014 0804    BASOSABS 0.0 11/28/2013 0948     Chemistry      Component Value Date/Time   NA 139 01/14/2014 0804   NA 137 11/28/2013 0948   K 4.8 01/14/2014 0804   K 5.1 11/28/2013 0948   CL 108 11/28/2013 0948   CL 107 03/19/2013 0949   CO2 25 01/14/2014 0804   CO2 26 11/28/2013 0948   BUN 16.7 01/14/2014 0804   BUN 20 11/28/2013 0948   CREATININE 1.7* 01/14/2014 0804   CREATININE 1.8* 11/28/2013 0948   CREATININE 1.59* 09/27/2013 0848      Component Value Date/Time   CALCIUM 9.4 01/14/2014 0804   CALCIUM 8.9 11/28/2013 0948   ALKPHOS 45 01/14/2014 0804   ALKPHOS 37* 03/20/2012 1049   AST 21 01/14/2014 0804   AST 20 03/20/2012 1049   ALT 20 01/14/2014 0804   ALT 16 03/20/2012 1049   BILITOT 0.71 01/14/2014 0804   BILITOT 0.6 03/20/2012 1049    IgG 2120 mg percent on 01/14/2014 comparable to previous values and lower than the last 2 values(2660 on 09/17/2013 and 2410 on 03/19/13) Kappa/lambda light chain ratio borderline elevated at 2.34 but consistent with prior values.  Most recent PSA: 2.55 on 09/17/2013 comparable with previous values to last 2 years.   Radiological Studies: Dg Chest 2 View  01/21/2014   CLINICAL DATA:  Preop chest x-ray for right rotator cuff repair.  EXAM: CHEST  2 VIEW  COMPARISON:  06/13/2010  FINDINGS: Heart size is within normal limits. The lungs are free of focal consolidations and pleural effusions. Note is made of significant mid thoracic degenerative changes. No suspicious lytic or blastic lesions are identified.  IMPRESSION: 1.  No evidence for acute cardiopulmonary abnormality. 2. Mid thoracic degenerative changes.   Electronically Signed   By: Shon Hale M.D.   On: 01/21/2014 14:38    Impression:  #1. IgG kappa monoclonal gammopathy of undetermined significance. Initial diagnosis July 2000.   Hemoglobin, total IgG, and free light chains remain stable.  Plan: Continue every 6 month laboratory monitoring.   #2. Localized prostate cancer treated with radiofrequency ablation at time of  diagnosis in February of 2000. Stable PSA with minor fluctuations.   #3. 2 episodes of self-limited gross hematuria  This may be a result of anticoagulation with Pradaxa.   #4. Single vessel coronary artery disease.   #5. Aortic systolic murmur with mild aortic stenosis and regurgitation on echocardiogram.   #6. Atrial fibrillation diagnosed 4 years ago. Currently stable on Multaq(Dronedarone). 400 mg in the morning 400 mg in the evening. Previous relapse after DC cardioversion x 2. Recent repeat cardioversion 1/15  with addition of low-dose beta blocker metoprolol, 25 mg twice a day, to his medical regimen. Currently on anticoagulation with Pradaxa 75 mg twice a day.   #7. GERD   #8. Hyperlipidemia   #9. Status post excision multiple basal cell carcinomas   #10. Degenerative arthritis of the spine.   #11. Remote history of a transient ischemic attack.  #12. Orthopedic problems with right shoulder for arthroscopy and rotator cuff repair this week 01/23/2014.  CC: Patient Care Team: Annia Belt, MD as PCP - General (Hematology and Oncology) Lennon Alstrom, MD (Neurology) Cleotis Nipper, MD (Gastroenterology) Ailene Rud, MD (Urology)   Annia Belt, MD 3/9/20158:00 PM

## 2014-01-21 NOTE — Pre-Procedure Instructions (Signed)
[name] [name]  01/21/2014   Your procedure is scheduled on:  Wednesday, January 23, 2014 at 11:30 AM.   Report to [facility] Entrance "A" Admitting Office at 9:30 AM.   Call this number if you have problems the morning of surgery: [phone]   Remember:   Do not eat food or drink liquids after midnight Tuesday, 01/22/14.   Take these medicines the morning of surgery with A SIP OF WATER: dronedarone (MULTAQ), metoprolol tartrate (LOPRESSOR), omeprazole (PRILOSEC), oxyCODONE-acetaminophen (PERCOCET/ROXICET) - if needed. You may use your eye drops if needed.  Stop Naproxen (Aleve) as of today. Follow instructions from Dr. Ron Parker about Pradaxa and Aspirin.    Do not wear jewelry.  Do not wear lotions, powders, or cologne. You may NOT wear deodorant.  Men may shave face and neck.  Do not bring valuables to the hospital.  [facility] is not responsible                  for any belongings or valuables.               Contacts, dentures or bridgework may not be worn into surgery.  Leave suitcase in the car. After surgery it may be brought to your room.  For patients admitted to the hospital, discharge time is determined by your                treatment team.                             Special Instructions:  - Preparing for Surgery  Before surgery, you can play an important role.  Because skin is not sterile, your skin needs to be as free of germs as possible.  You can reduce the number of germs on you skin by washing with CHG (chlorahexidine gluconate) soap before surgery.  CHG is an antiseptic cleaner which kills germs and bonds with the skin to continue killing germs even after washing.  Please DO NOT use if you have an allergy to CHG or antibacterial soaps.  If your skin becomes reddened/irritated stop using the CHG and inform your nurse when you arrive at Short Stay.  Do not shave (including legs and underarms) for at least 48 hours prior to the first CHG shower.  You  may shave your face.  Please follow these instructions carefully:   1.  Shower with CHG Soap the night before surgery and the                                morning of Surgery.  2.  If you choose to wash your hair, wash your hair first as usual with your       normal shampoo.  3.  After you shampoo, rinse your hair and body thoroughly to remove the                      Shampoo.  4.  Use CHG as you would any other liquid soap.  You can apply chg directly       to the skin and wash gently with scrungie or a clean washcloth.  5.  Apply the CHG Soap to your body ONLY FROM THE NECK DOWN.        Do not use on open wounds or open sores.  Avoid contact with your eyes,  ears, mouth and genitals (private parts).  Wash genitals (private parts) with your normal soap.  6.  Wash thoroughly, paying special attention to the area where your surgery        will be performed.  7.  Thoroughly rinse your body with warm water from the neck down.  8.  DO NOT shower/wash with your normal soap after using and rinsing off       the CHG Soap.  9.  Pat yourself dry with a clean towel.            10.  Wear clean pajamas.            11.  Place clean sheets on your bed the night of your first shower and do not        sleep with pets.  Day of Surgery  Do not apply any lotions/deodorants the morning of surgery.  Please wear clean clothes to the hospital/surgery center.     Please read over the following fact sheets that you were given: Pain Booklet, Coughing and Deep Breathing and Surgical Site Infection Prevention

## 2014-01-22 MED ORDER — CLINDAMYCIN PHOSPHATE 900 MG/50ML IV SOLN: 900.0000 mg | INTRAVENOUS | Status: DC

## 2014-01-23 ENCOUNTER — Encounter (HOSPITAL_COMMUNITY): Payer: Medicare PPO | Admitting: Anesthesiology

## 2014-01-23 ENCOUNTER — Observation Stay (HOSPITAL_COMMUNITY)
Admission: RE | Admit: 2014-01-23 | Discharge: 2014-01-24 | Disposition: A | Discharge status: Home / Self Care | Payer: Medicare PPO | Source: Ambulatory Visit | Attending: Orthopedic Surgery | Admitting: Orthopedic Surgery

## 2014-01-23 ENCOUNTER — Ambulatory Visit (HOSPITAL_COMMUNITY): Payer: Medicare PPO | Admitting: Anesthesiology

## 2014-01-23 ENCOUNTER — Encounter (HOSPITAL_COMMUNITY): Payer: Self-pay | Admitting: Anesthesiology

## 2014-01-23 ENCOUNTER — Encounter (HOSPITAL_COMMUNITY)
Admission: RE | Disposition: A | Discharge status: Home / Self Care | Payer: Self-pay | Source: Ambulatory Visit | Attending: Orthopedic Surgery

## 2014-01-23 DIAGNOSIS — I4891 Unspecified atrial fibrillation: Secondary | ICD-10-CM | POA: Insufficient documentation

## 2014-01-23 DIAGNOSIS — G459 Transient cerebral ischemic attack, unspecified: Secondary | ICD-10-CM | POA: Diagnosis present

## 2014-01-23 DIAGNOSIS — I2510 Atherosclerotic heart disease of native coronary artery without angina pectoris: Secondary | ICD-10-CM | POA: Insufficient documentation

## 2014-01-23 DIAGNOSIS — D472 Monoclonal gammopathy: Secondary | ICD-10-CM | POA: Insufficient documentation

## 2014-01-23 DIAGNOSIS — Z8673 Personal history of transient ischemic attack (TIA), and cerebral infarction without residual deficits: Secondary | ICD-10-CM | POA: Insufficient documentation

## 2014-01-23 DIAGNOSIS — M719 Bursopathy, unspecified: Secondary | ICD-10-CM | POA: Insufficient documentation

## 2014-01-23 DIAGNOSIS — M67919 Unspecified disorder of synovium and tendon, unspecified shoulder: Secondary | ICD-10-CM | POA: Insufficient documentation

## 2014-01-23 DIAGNOSIS — E785 Hyperlipidemia, unspecified: Secondary | ICD-10-CM | POA: Insufficient documentation

## 2014-01-23 DIAGNOSIS — I498 Other specified cardiac arrhythmias: Secondary | ICD-10-CM | POA: Insufficient documentation

## 2014-01-23 DIAGNOSIS — M5030 Other cervical disc degeneration, unspecified cervical region: Secondary | ICD-10-CM | POA: Insufficient documentation

## 2014-01-23 DIAGNOSIS — Z8546 Personal history of malignant neoplasm of prostate: Secondary | ICD-10-CM | POA: Insufficient documentation

## 2014-01-23 DIAGNOSIS — M7580 Other shoulder lesions, unspecified shoulder: Secondary | ICD-10-CM

## 2014-01-23 DIAGNOSIS — M75120 Complete rotator cuff tear or rupture of unspecified shoulder, not specified as traumatic: Principal | ICD-10-CM | POA: Insufficient documentation

## 2014-01-23 DIAGNOSIS — M75101 Unspecified rotator cuff tear or rupture of right shoulder, not specified as traumatic: Secondary | ICD-10-CM | POA: Diagnosis present

## 2014-01-23 DIAGNOSIS — Z87891 Personal history of nicotine dependence: Secondary | ICD-10-CM | POA: Insufficient documentation

## 2014-01-23 DIAGNOSIS — I4510 Unspecified right bundle-branch block: Secondary | ICD-10-CM | POA: Insufficient documentation

## 2014-01-23 DIAGNOSIS — M24119 Other articular cartilage disorders, unspecified shoulder: Secondary | ICD-10-CM | POA: Insufficient documentation

## 2014-01-23 DIAGNOSIS — K219 Gastro-esophageal reflux disease without esophagitis: Secondary | ICD-10-CM | POA: Insufficient documentation

## 2014-01-23 DIAGNOSIS — I080 Rheumatic disorders of both mitral and aortic valves: Secondary | ICD-10-CM | POA: Insufficient documentation

## 2014-01-23 DIAGNOSIS — I350 Nonrheumatic aortic (valve) stenosis: Secondary | ICD-10-CM | POA: Diagnosis present

## 2014-01-23 DIAGNOSIS — N289 Disorder of kidney and ureter, unspecified: Secondary | ICD-10-CM | POA: Insufficient documentation

## 2014-01-23 DIAGNOSIS — M25819 Other specified joint disorders, unspecified shoulder: Secondary | ICD-10-CM | POA: Insufficient documentation

## 2014-01-23 DIAGNOSIS — I779 Disorder of arteries and arterioles, unspecified: Secondary | ICD-10-CM | POA: Insufficient documentation

## 2014-01-23 HISTORY — PX: SHOULDER ARTHROSCOPY WITH ROTATOR CUFF REPAIR: SHX5685

## 2014-01-23 SURGERY — ARTHROSCOPY, SHOULDER, WITH ROTATOR CUFF REPAIR
Anesthesia: Regional | Site: Shoulder | Laterality: Right

## 2014-01-23 MED ORDER — ACETAMINOPHEN 650 MG RE SUPP: 650.0000 mg | Freq: Four times a day (QID) | RECTAL | Status: DC | PRN

## 2014-01-23 MED ORDER — PROPOFOL 10 MG/ML IV BOLUS
INTRAVENOUS | Status: DC | PRN
  Administered 2014-01-23: 100 mg via INTRAVENOUS

## 2014-01-23 MED ORDER — GLYCOPYRROLATE 0.2 MG/ML IJ SOLN
INTRAMUSCULAR | Status: DC | PRN
Start: 2014-01-23 — End: 2014-01-23
  Administered 2014-01-23: .8 mg via INTRAVENOUS

## 2014-01-23 MED ORDER — ASPIRIN EC 81 MG PO TBEC
81.0000 mg | DELAYED_RELEASE_TABLET | Freq: Every day | ORAL | Status: DC
  Filled 2014-01-23: qty 1

## 2014-01-23 MED ORDER — DOCUSATE SODIUM 100 MG PO CAPS
100.0000 mg | ORAL_CAPSULE | Freq: Two times a day (BID) | ORAL | Status: DC
  Filled 2014-01-23 (×2): qty 1

## 2014-01-23 MED ORDER — LIDOCAINE HCL (CARDIAC) 20 MG/ML IV SOLN
INTRAVENOUS | Status: AC
Start: 2014-01-23 — End: 2014-01-23
  Filled 2014-01-23: qty 5

## 2014-01-23 MED ORDER — CLINDAMYCIN PHOSPHATE 600 MG/50ML IV SOLN
600.0000 mg | Freq: Four times a day (QID) | INTRAVENOUS | Status: AC
  Administered 2014-01-23 – 2014-01-24 (×3): 600 mg via INTRAVENOUS
  Filled 2014-01-23 (×3): qty 50

## 2014-01-23 MED ORDER — 0.9 % SODIUM CHLORIDE (POUR BTL) OPTIME: TOPICAL | Status: DC | PRN

## 2014-01-23 MED ORDER — EPINEPHRINE HCL 1 MG/ML IJ SOLN
INTRAMUSCULAR | Status: AC
  Filled 2014-01-23: qty 1

## 2014-01-23 MED ORDER — CHLORHEXIDINE GLUCONATE 4 % EX LIQD: 60.0000 mL | Freq: Once | CUTANEOUS | Status: DC

## 2014-01-23 MED ORDER — LACTATED RINGERS IV SOLN: INTRAVENOUS | Status: DC

## 2014-01-23 MED ORDER — ARTIFICIAL TEARS OP OINT
TOPICAL_OINTMENT | OPHTHALMIC | Status: AC
Start: 2014-01-23 — End: 2014-01-23
  Filled 2014-01-23: qty 3.5

## 2014-01-23 MED ORDER — LIDOCAINE HCL (CARDIAC) 20 MG/ML IV SOLN
INTRAVENOUS | Status: DC | PRN
  Administered 2014-01-23: 20 mg via INTRAVENOUS

## 2014-01-23 MED ORDER — SODIUM CHLORIDE 0.9 % IR SOLN
Status: DC | PRN
  Administered 2014-01-23 (×4): 3000 mL

## 2014-01-23 MED ORDER — DRONEDARONE HCL 400 MG PO TABS
400.0000 mg | ORAL_TABLET | Freq: Two times a day (BID) | ORAL | Status: DC
  Administered 2014-01-23 – 2014-01-24 (×2): 400 mg via ORAL
  Filled 2014-01-23 (×5): qty 1

## 2014-01-23 MED ORDER — PANTOPRAZOLE SODIUM 40 MG PO TBEC: 40.0000 mg | DELAYED_RELEASE_TABLET | Freq: Every day | ORAL | Status: DC

## 2014-01-23 MED ORDER — EPHEDRINE SULFATE 50 MG/ML IJ SOLN
INTRAMUSCULAR | Status: AC
  Filled 2014-01-23: qty 1

## 2014-01-23 MED ORDER — ONDANSETRON HCL 4 MG/2ML IJ SOLN: 4.0000 mg | Freq: Once | INTRAMUSCULAR | Status: DC | PRN

## 2014-01-23 MED ORDER — MIDAZOLAM HCL 2 MG/2ML IJ SOLN
INTRAMUSCULAR | Status: AC
  Administered 2014-01-23: 2 mg
  Filled 2014-01-23: qty 2

## 2014-01-23 MED ORDER — FENTANYL CITRATE 0.05 MG/ML IJ SOLN
50.0000 ug | INTRAMUSCULAR | Status: DC | PRN
  Administered 2014-01-23: 50 ug via INTRAVENOUS

## 2014-01-23 MED ORDER — METOPROLOL TARTRATE 25 MG PO TABS
25.0000 mg | ORAL_TABLET | Freq: Two times a day (BID) | ORAL | Status: DC
  Administered 2014-01-23 – 2014-01-24 (×2): 25 mg via ORAL
  Filled 2014-01-23 (×4): qty 1

## 2014-01-23 MED ORDER — ONDANSETRON HCL 4 MG/2ML IJ SOLN: 4.0000 mg | Freq: Four times a day (QID) | INTRAMUSCULAR | Status: DC | PRN

## 2014-01-23 MED ORDER — ACETAMINOPHEN 325 MG PO TABS: 650.0000 mg | ORAL_TABLET | Freq: Four times a day (QID) | ORAL | Status: DC | PRN

## 2014-01-23 MED ORDER — LACTATED RINGERS IV SOLN
INTRAVENOUS | Status: DC | PRN
  Administered 2014-01-23 (×2): via INTRAVENOUS

## 2014-01-23 MED ORDER — METOCLOPRAMIDE HCL 10 MG PO TABS: 5.0000 mg | ORAL_TABLET | Freq: Three times a day (TID) | ORAL | Status: DC | PRN

## 2014-01-23 MED ORDER — MIDAZOLAM HCL 2 MG/2ML IJ SOLN: 1.0000 mg | INTRAMUSCULAR | Status: DC | PRN

## 2014-01-23 MED ORDER — FENTANYL CITRATE 0.05 MG/ML IJ SOLN
INTRAMUSCULAR | Status: AC
  Filled 2014-01-23: qty 2

## 2014-01-23 MED ORDER — NEOSTIGMINE METHYLSULFATE 1 MG/ML IJ SOLN
INTRAMUSCULAR | Status: AC
  Filled 2014-01-23: qty 10

## 2014-01-23 MED ORDER — BUPIVACAINE-EPINEPHRINE (PF) 0.25% -1:200000 IJ SOLN
INTRAMUSCULAR | Status: AC
  Filled 2014-01-23: qty 30

## 2014-01-23 MED ORDER — FENTANYL CITRATE 0.05 MG/ML IJ SOLN
INTRAMUSCULAR | Status: DC | PRN
  Administered 2014-01-23: 75 ug via INTRAVENOUS

## 2014-01-23 MED ORDER — PHENOL 1.4 % MT LIQD: 1.0000 | OROMUCOSAL | Status: DC | PRN

## 2014-01-23 MED ORDER — OXYCODONE HCL 5 MG/5ML PO SOLN: 5.0000 mg | Freq: Once | ORAL | Status: DC | PRN

## 2014-01-23 MED ORDER — CLINDAMYCIN PHOSPHATE 900 MG/50ML IV SOLN
INTRAVENOUS | Status: AC
  Administered 2014-01-23: 900 mg via INTRAVENOUS
  Filled 2014-01-23: qty 50

## 2014-01-23 MED ORDER — DABIGATRAN ETEXILATE MESYLATE 75 MG PO CAPS
75.0000 mg | ORAL_CAPSULE | Freq: Two times a day (BID) | ORAL | Status: DC
  Filled 2014-01-23 (×3): qty 1

## 2014-01-23 MED ORDER — ROCURONIUM BROMIDE 100 MG/10ML IV SOLN
INTRAVENOUS | Status: DC | PRN
  Administered 2014-01-23: 40 mg via INTRAVENOUS
  Administered 2014-01-23: 10 mg via INTRAVENOUS

## 2014-01-23 MED ORDER — POLYETHYL GLYCOL-PROPYL GLYCOL 0.4-0.3 % OP SOLN: 1.0000 [drp] | Freq: Every day | OPHTHALMIC | Status: DC

## 2014-01-23 MED ORDER — LACTATED RINGERS IV SOLN
INTRAVENOUS | Status: DC
  Administered 2014-01-23: 10:00:00 via INTRAVENOUS

## 2014-01-23 MED ORDER — HYDROMORPHONE HCL PF 1 MG/ML IJ SOLN: 0.2500 mg | INTRAMUSCULAR | Status: DC | PRN

## 2014-01-23 MED ORDER — ONDANSETRON HCL 4 MG PO TABS: 4.0000 mg | ORAL_TABLET | Freq: Four times a day (QID) | ORAL | Status: DC | PRN

## 2014-01-23 MED ORDER — ASPIRIN 81 MG PO TABS: 81.0000 mg | ORAL_TABLET | ORAL | Status: DC

## 2014-01-23 MED ORDER — MENTHOL 3 MG MT LOZG: 1.0000 | LOZENGE | OROMUCOSAL | Status: DC | PRN

## 2014-01-23 MED ORDER — METOPROLOL TARTRATE 25 MG PO TABS
25.0000 mg | ORAL_TABLET | Freq: Two times a day (BID) | ORAL | Status: DC
  Filled 2014-01-23: qty 1

## 2014-01-23 MED ORDER — ARTIFICIAL TEARS OP OINT
TOPICAL_OINTMENT | OPHTHALMIC | Status: DC | PRN
  Administered 2014-01-23: 1 via OPHTHALMIC

## 2014-01-23 MED ORDER — METOCLOPRAMIDE HCL 5 MG/ML IJ SOLN: 5.0000 mg | Freq: Three times a day (TID) | INTRAMUSCULAR | Status: DC | PRN

## 2014-01-23 MED ORDER — HYDROMORPHONE HCL PF 1 MG/ML IJ SOLN: 0.5000 mg | INTRAMUSCULAR | Status: DC | PRN

## 2014-01-23 MED ORDER — BISACODYL 5 MG PO TBEC
10.0000 mg | DELAYED_RELEASE_TABLET | Freq: Every day | ORAL | Status: DC
  Filled 2014-01-23: qty 2

## 2014-01-23 MED ORDER — EPHEDRINE SULFATE 50 MG/ML IJ SOLN
INTRAMUSCULAR | Status: DC | PRN
  Administered 2014-01-23: 10 mg via INTRAVENOUS

## 2014-01-23 MED ORDER — EPINEPHRINE HCL 1 MG/ML IJ SOLN
INTRAMUSCULAR | Status: DC | PRN
  Administered 2014-01-23 (×4): 1 mg

## 2014-01-23 MED ORDER — NEOSTIGMINE METHYLSULFATE 1 MG/ML IJ SOLN
INTRAMUSCULAR | Status: DC | PRN
  Administered 2014-01-23: 5 mg via INTRAVENOUS

## 2014-01-23 MED ORDER — ONDANSETRON HCL 4 MG/2ML IJ SOLN
INTRAMUSCULAR | Status: AC
  Filled 2014-01-23: qty 2

## 2014-01-23 MED ORDER — GLYCOPYRROLATE 0.2 MG/ML IJ SOLN
INTRAMUSCULAR | Status: AC
  Filled 2014-01-23: qty 3

## 2014-01-23 MED ORDER — ROCURONIUM BROMIDE 50 MG/5ML IV SOLN
INTRAVENOUS | Status: AC
  Filled 2014-01-23: qty 1

## 2014-01-23 MED ORDER — ATORVASTATIN CALCIUM 10 MG PO TABS
10.0000 mg | ORAL_TABLET | Freq: Every day | ORAL | Status: DC
  Administered 2014-01-24: 10 mg via ORAL
  Filled 2014-01-23 (×3): qty 1

## 2014-01-23 MED ORDER — FENTANYL CITRATE 0.05 MG/ML IJ SOLN
INTRAMUSCULAR | Status: AC
  Filled 2014-01-23: qty 5

## 2014-01-23 MED ORDER — PROPOFOL 10 MG/ML IV BOLUS
INTRAVENOUS | Status: AC
  Filled 2014-01-23: qty 20

## 2014-01-23 MED ORDER — POLYVINYL ALCOHOL 1.4 % OP SOLN
1.0000 [drp] | Freq: Every day | OPHTHALMIC | Status: DC
  Filled 2014-01-23: qty 15

## 2014-01-23 MED ORDER — OXYCODONE HCL 5 MG PO TABS: 5.0000 mg | ORAL_TABLET | Freq: Once | ORAL | Status: DC | PRN

## 2014-01-23 MED ORDER — OXYCODONE HCL 5 MG PO TABS: 5.0000 mg | ORAL_TABLET | ORAL | Status: DC | PRN

## 2014-01-23 MED ORDER — POTASSIUM CHLORIDE IN NACL 20-0.9 MEQ/L-% IV SOLN
INTRAVENOUS | Status: DC
  Administered 2014-01-23: 100 mL/h via INTRAVENOUS
  Filled 2014-01-23 (×4): qty 1000

## 2014-01-23 SURGICAL SUPPLY — 49 items
ANCH SUT SWLK 19.1X4.75 (Anchor) ×1 IMPLANT
ANCHOR SUT BIO SW 4.75X19.1 (Anchor) ×2 IMPLANT
BLADE CUTTER GATOR 3.5 (BLADE) ×3 IMPLANT
BLADE GREAT WHITE 4.2 (BLADE) ×1 IMPLANT
BLADE GREAT WHITE 4.2MM (BLADE) ×1
BNDG COHESIVE 4X5 TAN STRL (GAUZE/BANDAGES/DRESSINGS) ×4 IMPLANT
BUR OVAL 6.0 (BURR) ×3 IMPLANT
CANNULA TWIST IN 8.25X7CM (CANNULA) ×4 IMPLANT
DRAPE PROXIMA HALF (DRAPES) ×2 IMPLANT
DRAPE SHOULDER BEACH CHAIR (DRAPES) ×3 IMPLANT
DRAPE U-SHAPE 47X51 STRL (DRAPES) ×6 IMPLANT
DURAPREP 26ML APPLICATOR (WOUND CARE) ×3 IMPLANT
ELECT REM PT RETURN 9FT ADLT (ELECTROSURGICAL) ×3
ELECTRODE REM PT RTRN 9FT ADLT (ELECTROSURGICAL) ×1 IMPLANT
GAUZE XEROFORM 1X8 LF (GAUZE/BANDAGES/DRESSINGS) ×3 IMPLANT
GLOVE BIO SURGEON STRL SZ8.5 (GLOVE) ×2 IMPLANT
GLOVE BIOGEL PI IND STRL 7.5 (GLOVE) ×1 IMPLANT
GLOVE BIOGEL PI INDICATOR 7.5 (GLOVE) ×2
GLOVE SS BIOGEL STRL SZ 7.5 (GLOVE) ×1 IMPLANT
GLOVE SUPERSENSE BIOGEL SZ 7.5 (GLOVE) ×2
GOWN STRL REUS W/ TWL LRG LVL3 (GOWN DISPOSABLE) ×1 IMPLANT
GOWN STRL REUS W/TWL LRG LVL3 (GOWN DISPOSABLE) ×3
KIT BASIN OR (CUSTOM PROCEDURE TRAY) ×3 IMPLANT
MANIFOLD NEPTUNE II (INSTRUMENTS) ×3 IMPLANT
NDL FILTER BLUNT 18X1 1/2 (NEEDLE) IMPLANT
NDL SAFETY ECLIPSE 18X1.5 (NEEDLE) ×1 IMPLANT
NDL SCORPION MULTI FIRE (NEEDLE) IMPLANT
NEEDLE FILTER BLUNT 18X 1/2SAF (NEEDLE) ×2
NEEDLE FILTER BLUNT 18X1 1/2 (NEEDLE) ×1 IMPLANT
NEEDLE HYPO 18GX1.5 SHARP (NEEDLE) ×3
NEEDLE SCORPION MULTI FIRE (NEEDLE) ×3 IMPLANT
PACK ARTHROSCOPY DSU (CUSTOM PROCEDURE TRAY) ×3 IMPLANT
PAD ABD 8X10 STRL (GAUZE/BANDAGES/DRESSINGS) ×3 IMPLANT
SET ARTHROSCOPY TUBING (MISCELLANEOUS) ×3
SET ARTHROSCOPY TUBING LN (MISCELLANEOUS) ×1 IMPLANT
SPONGE GAUZE 4X4 12PLY (GAUZE/BANDAGES/DRESSINGS) ×3 IMPLANT
SPONGE LAP 4X18 X RAY DECT (DISPOSABLE) ×2 IMPLANT
SUT ETHILON 3 0 PS 1 (SUTURE) ×3 IMPLANT
SUT FIBERWIRE #2 38 T-5 BLUE (SUTURE) ×3
SUTURE FIBERWR #2 38 T-5 BLUE (SUTURE) IMPLANT
SYR 20CC LL (SYRINGE) ×2 IMPLANT
SYR 5ML LL (SYRINGE) ×3 IMPLANT
SYR TB 1ML LUER SLIP (SYRINGE) ×4 IMPLANT
TAPE STRIPS DRAPE STRL (GAUZE/BANDAGES/DRESSINGS) ×3 IMPLANT
TOWEL OR 17X24 6PK STRL BLUE (TOWEL DISPOSABLE) ×3 IMPLANT
TUBE CONNECTING 20'X1/4 (TUBING) ×1
TUBE CONNECTING 20X1/4 (TUBING) ×1 IMPLANT
WAND STAR VAC 90 (SURGICAL WAND) ×3 IMPLANT
WATER STERILE IRR 1000ML POUR (IV SOLUTION) ×3 IMPLANT

## 2014-01-23 NOTE — Progress Notes (Signed)
Pt states has been itching since he took his CHG shower.  Back of neck and upper back red with light rash.  CHG added to allergy list.

## 2014-01-23 NOTE — H&P (View-Only) (Signed)
[name] [name] is an 80 y.o. male.   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 He has pain when he plays golf and with overuse. He also has right medial elbow pain without any specific injury, intermittent sharp pain in the ulnar groove.   Past Medical History  Diagnosis Date  . Coronary artery disease     minimal, cath 2005 / catheterization August, 2011, 30% in 3 vessels, normal LV function  . Hyperlipidemia   . Atrial fibrillation 2011, 2015    Multaq Started September, 2011, dose adjusted October, 2011, 200 mg a.m., 400 mg p.o.  . Monoclonal gammopathy     granfortuna  . TIA (transient ischemic attack)     Dr. Erling Cruz,, question in the past. when off ASA for 10 days  . GERD (gastroesophageal reflux disease)     Esophageal dilatation in 1992, some esophageal spasm  . S/P transurethral resection of prostate 2004    2004  . MR (mitral regurgitation) 2009    mild, Echo, July, 2011  . Cervical spine disease   . Renal insufficiency     Prior creatinine 1.2 /  September, 2011.. 1.7  /  October, 2011.. 1.8  . Ejection fraction     EF 60%, echo, 2009  /  TEE normal August, 2011 /   EF 60%, echo, July, 2011  . Drug therapy     Pradaxa Started July, 2011  . Aortic stenosis     Mild, echo, July, 2011  . Aortic insufficiency     Mild, echo, July, 2011  . Carotid artery disease     Doppler, 2008 no significant abnormality  . Diarrhea     Chronic  . Incomplete right bundle branch block     August, 2012  . Prostate cancer 11/22/2011  . Painless hematuria 03/27/2012    Occurred x 2 5/12 & 5/13 on Pradaxa 75 mg BID  . Bradycardia     Sinus bradycardia while on 25 Lopressor twice a day October, 2013  . Memory change     June, 2014  . Leg fatigue     June, 2014    Past Surgical History  Procedure Laterality Date  . History of turp    . Hernia repair    . Cataract  extraction    . Fibular fracture repair    . Cardioversion  08/01/2012    Procedure: CARDIOVERSION;  Surgeon: Carlena Bjornstad, MD;  Location: [facility] ENDOSCOPY;  Service: Cardiovascular;  Laterality: N/A;  . Cardioversion N/A 11/29/2013    Procedure: CARDIOVERSION;  Surgeon: Carlena Bjornstad, MD;  Location: [facility] ENDOSCOPY;  Service: Cardiovascular;  Laterality: N/A;    No family history on file. Social History:  reports that he has quit smoking. He quit smokeless tobacco use about 16 years ago. He reports that he drinks alcohol. He reports that he does not use illicit drugs.  Allergies:  Allergies  Allergen Reactions  . Guaifenesin & Derivatives   . Penicillins      (Not in a hospital admission)  No results found for this or any previous visit (from the past 48 hour(s)). No results found.  Review of Systems  Constitutional: Negative.   HENT: Negative.   Eyes: Negative.   Respiratory: Negative.   Cardiovascular: Negative.   Gastrointestinal: Negative.   Genitourinary: Negative.   Musculoskeletal: Positive for joint pain.  Shoulder  Skin: Negative.   Neurological: Negative.   Endo/Heme/Allergies: Negative.   Psychiatric/Behavioral: Negative.     Height 5\' 9"  (1.753 m), weight 88.451 kg (195 lb). Physical Exam  Constitutional: He is oriented to person, place, and time. He appears well-developed and well-nourished.  HENT:  Head: Normocephalic.  Eyes: Conjunctivae are normal. Pupils are equal, round, and reactive to light.  Neck: Neck supple.  Cardiovascular: Normal rate.   Respiratory: Effort normal.  GI: Soft.  Genitourinary:  Not pertinent to current symptomatology therefore not examined.  Musculoskeletal:  Examination of his right shoulder reveals anterior biceps pain. No posterior pain range of motion 80% of normal with pain at the extremes no instability. Exam of his left shoulder reveals full range of motion without pain swelling weakness or instability. Exam of the  right elbow reveals pain in the ulnar groove he has no swelling, he has negative Tinel's sign no pain over the medial epicondyle full range of motion elbow is stable. Exam of the left elbow reveals full range of motion without pain swelling weakness or instability. Vascular exam: pulses 2+ and symmetric.  Neurological: He is alert and oriented to person, place, and time.  Skin: Skin is warm and dry.  Psychiatric: He has a normal mood and affect. His behavior is normal.     Assessment  Right shouder rotator cuff tear  Patient Active Problem List   Diagnosis Date Noted  . GERD (gastroesophageal reflux disease)   . Skin lesion of back 12/24/2013  . Memory change   . Leg fatigue   . Bradycardia   . Carotid artery disease   . Painless hematuria 03/27/2012  . Prostate cancer 11/22/2011  . Incomplete right bundle branch block   . Atrial fibrillation   . Coronary artery disease   . Hyperlipidemia   . Monoclonal gammopathy   . TIA (transient ischemic attack)   . S/P transurethral resection of prostate   . MR (mitral regurgitation)   . Cervical spine disease   . Renal insufficiency   . Ejection fraction   . Drug therapy   . Aortic stenosis   . Aortic insufficiency   . 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 We injected his right shoulder on 12/03/13 which helped for 24 hours and then pain recurred. He has pain on a daily basis and pain at night and in the morning and pain playing golf which he loves to do. His primary care physician is Dr. Beryle Beams and is followed for his monoclonal gammopathy. Also followed by Dr. Ron Parker of cardiology for his atrial fibrillation. He had cardioversion a month ago and is doing well. Current medications: aspirin, Pradaxia, Prilosec, Metoprolol, Lipitor and Naproxen. Drug allergy to penicillin.   I told him with these findings his significant persistent pain and lack of response to conservative care he may need a right shoulder arthroscopy with rotator cuff repair subacromial decompression and distal clavicle excision. Discussed risks benefits and possible complications in detail and he understands this completely. He will need preoperative clearance from Dr. Ron Parker and Dr. Beryle Beams. He has limited help at home and will need to arrange for this versus potentially a temporary stay in a rehab facility. We will set him up for this when his medical and social issues are addressed.  Lexx Monte J 01/20/2014, 11:15 AM

## 2014-01-23 NOTE — Transfer of Care (Signed)
Immediate Anesthesia Transfer of Care Note  Patient: [name] [name]  Procedure(s) Performed: Procedure(s): RIGHT SHOULDER ARTHROSCOPY WITH DEBRIDEMENT AND ROTATOR CUFF REPAIR (Right)  Patient Location: PACU  Anesthesia Type:General and Regional  Level of Consciousness: awake, alert , oriented and sedated  Airway & Oxygen Therapy: Patient Spontanous Breathing and Patient connected to nasal cannula oxygen  Post-op Assessment: Report given to PACU RN, Post -op Vital signs reviewed and stable and Patient moving all extremities  Post vital signs: Reviewed and stable  Complications: No apparent anesthesia complications

## 2014-01-23 NOTE — Interval H&P Note (Signed)
History and Physical Interval Note:  01/23/2014 12:39 PM  [name] [name]  has presented today for surgery, with the diagnosis of right shoulder impingment and rotator cuff tear  The various methods of treatment have been discussed with the patient and family. After consideration of risks, benefits and other options for treatment, the patient has consented to  Procedure(s): RIGHT SHOULDER ARTHROSCOPY WITH DEBRIDEMENT AND ROTATOR CUFF REPAIR (Right) as a surgical intervention .  The patient's history has been reviewed, patient examined, no change in status, stable for surgery.  I have reviewed the patient's chart and labs.  Questions were answered to the patient's satisfaction.     [name]

## 2014-01-23 NOTE — Anesthesia Postprocedure Evaluation (Signed)
  Anesthesia Post-op Note  Patient: [name] [name]  Procedure(s) Performed: Procedure(s): RIGHT SHOULDER ARTHROSCOPY WITH DEBRIDEMENT AND ROTATOR CUFF REPAIR (Right)  Patient Location: PACU  Anesthesia Type:GA combined with regional for post-op pain  Level of Consciousness: awake, alert  and oriented  Airway and Oxygen Therapy: Patient Spontanous Breathing and Patient connected to nasal cannula oxygen  Post-op Pain: none  Post-op Assessment: Post-op Vital signs reviewed, Patient's Cardiovascular Status Stable, Respiratory Function Stable, Patent Airway, No signs of Nausea or vomiting and Pain level controlled  Post-op Vital Signs: stable  Complications: No apparent anesthesia complications

## 2014-01-23 NOTE — Anesthesia Procedure Notes (Addendum)
Procedure Name: Intubation Date/Time: 01/23/2014 12:54 PM Performed by: Scheryl Darter Pre-anesthesia Checklist: Patient identified, Emergency Drugs available, Suction available, Patient being monitored and Timeout performed Patient Re-evaluated:Patient Re-evaluated prior to inductionOxygen Delivery Method: Circle system utilized Preoxygenation: Pre-oxygenation with 100% oxygen Intubation Type: IV induction Ventilation: Mask ventilation without difficulty Laryngoscope Size: Miller and Mac Grade View: Grade I Tube type: Oral Tube size: 7.5 mm Number of attempts: 1 Airway Equipment and Method: Stylet Placement Confirmation: ETT inserted through vocal cords under direct vision,  positive ETCO2 and breath sounds checked- equal and bilateral Secured at: 23 cm Tube secured with: Tape Dental Injury: Teeth and Oropharynx as per pre-operative assessment    Anesthesia Regional Block:  Interscalene brachial plexus block  Pre-Anesthetic Checklist: ,, timeout performed, Correct Patient, Correct Site, Correct Laterality, Correct Procedure, Correct Position, site marked, Risks and benefits discussed,  Surgical consent,  Pre-op evaluation,  At surgeon's request and post-op pain management  Laterality: Left  Prep: Betadine       Needles:  Injection technique: Single-shot  Needle Type: Echogenic Stimulator Needle     Needle Length: 9cm 9 cm Needle Gauge: 22 and 22 G    Additional Needles:  Procedures: ultrasound guided (picture in chart) and nerve stimulator Interscalene brachial plexus block Narrative:  Start time: 01/23/2014 12:25 PM End time: 01/23/2014 12:30 PM Injection made incrementally with aspirations every 5 mL.  Performed by: Personally   Additional Notes: 15 cc 0.5% Naropin injected easily

## 2014-01-23 NOTE — Anesthesia Preprocedure Evaluation (Addendum)
Anesthesia Evaluation  Patient identified by MRN, date of birth, ID band Patient awake    Reviewed: Allergy & Precautions, H&P , NPO status , Patient's Chart, lab work & pertinent test results  Airway Mallampati: II TM Distance: >3 FB Neck ROM: Full    Dental  (+) Teeth Intact, Dental Advisory Given   Pulmonary former smoker,  breath sounds clear to auscultation        Cardiovascular Rhythm:Regular Rate:Normal     Neuro/Psych    GI/Hepatic   Endo/Other    Renal/GU      Musculoskeletal   Abdominal   Peds  Hematology   Anesthesia Other Findings   Reproductive/Obstetrics                           Anesthesia Physical Anesthesia Plan  ASA: III  Anesthesia Plan: General   Post-op Pain Management:    Induction: Intravenous  Airway Management Planned: Oral ETT  Additional Equipment:   Intra-op Plan:   Post-operative Plan: Extubation in OR  Informed Consent: I have reviewed the patients History and Physical, chart, labs and discussed the procedure including the risks, benefits and alternatives for the proposed anesthesia with the patient or authorized representative who has indicated his/her understanding and acceptance.   Dental advisory given  Plan Discussed with: CRNA and Anesthesiologist  Anesthesia Plan Comments: (Impingement L, shoulder H/O atrial fibrillation EF 60% on pradaxa off since 3/7 Mild aortic stenosis  mild nonobstructive CAD by cath 06/2010 normal EF Monoclonal gammopathy  Plan GA with oral ETT and interscalene block  Roberts Gaudy, MD )      Anesthesia Quick Evaluation

## 2014-01-23 NOTE — Preoperative (Signed)
Beta Blockers   Reason not to administer Beta Blockers:Not Applicable 

## 2014-01-24 ENCOUNTER — Encounter (HOSPITAL_COMMUNITY): Payer: Self-pay | Admitting: General Practice

## 2014-01-24 MED ORDER — OXYCODONE HCL 5 MG PO TABS: ORAL_TABLET | ORAL | Status: DC

## 2014-01-24 MED ORDER — ACETAMINOPHEN 325 MG PO TABS: 650.0000 mg | ORAL_TABLET | Freq: Four times a day (QID) | ORAL | Status: DC | PRN

## 2014-01-24 NOTE — Care Management Note (Signed)
CARE MANAGEMENT NOTE 01/24/2014  Patient:  [name] [name], [name] [name]   Account Number:  [id number]  Date Initiated:  01/24/2014  Documentation initiated by:  Ricki Miller  Subjective/Objective Assessment:   68 yr.old male s/p right shoulder rotator cuff repair.     Action/Plan:   No home health or DME needs identified. Patient discharged home with family.   Anticipated DC Date:  01/24/2014   Anticipated DC Plan:  HOME/SELF CARE      DC Planning Services  CM consult      Choice offered to / List presented to:     DME arranged  NA        HH arranged  NA      Status of service:  Completed, signed off Medicare Important Message given?   (If response is "NO", the following Medicare IM given date fields will be blank) Date Medicare IM given:   Date Additional Medicare IM given:    Discharge Disposition:  HOME/SELF CARE

## 2014-01-24 NOTE — Discharge Summary (Signed)
Patient ID: [name] [name] MRN: [id number] DOB/AGE: November 26, 1924 80 y.o.  Admit date: 01/23/2014 Discharge date: 01/24/2014  Admission Diagnoses:  Principal Problem:   Rotator cuff tear, right Active Problems:   Atrial fibrillation   Coronary artery disease   TIA (transient ischemic attack)   Ejection fraction   Aortic stenosis   Incomplete right bundle branch block   GERD (gastroesophageal reflux disease)   Discharge Diagnoses:  Same  Past Medical History  Diagnosis Date  . Coronary artery disease     minimal, cath 2005 / catheterization August, 2011, 30% in 3 vessels, normal LV function  . Hyperlipidemia   . Atrial fibrillation 2011, 2015    Multaq Started September, 2011, dose adjusted October, 2011, 200 mg a.m., 400 mg p.o.  . Monoclonal gammopathy     granfortuna  . TIA (transient ischemic attack)     Dr. Erling Cruz,, question in the past. when off ASA for 10 days  . GERD (gastroesophageal reflux disease)     Esophageal dilatation in 1992, some esophageal spasm  . S/P transurethral resection of prostate 2004    2004  . MR (mitral regurgitation) 2009    mild, Echo, July, 2011  . Cervical spine disease   . Renal insufficiency     Prior creatinine 1.2 /  September, 2011.. 1.7  /  October, 2011.. 1.8  . Ejection fraction     EF 60%, echo, 2009  /  TEE normal August, 2011 /   EF 60%, echo, July, 2011  . Drug therapy     Pradaxa Started July, 2011  . Aortic stenosis     Mild, echo, July, 2011  . Aortic insufficiency     Mild, echo, July, 2011  . Carotid artery disease     Doppler, 2008 no significant abnormality  . Diarrhea     Chronic  . Incomplete right bundle branch block     August, 2012  . Prostate cancer 11/22/2011  . Painless hematuria 03/27/2012    Occurred x 2 5/12 & 5/13 on Pradaxa 75 mg BID  . Bradycardia     Sinus bradycardia while on 25 Lopressor twice a day October, 2013  . Memory change     June, 2014  . Leg fatigue     June, 2014    Surgeries:  Procedure(s): RIGHT SHOULDER ARTHROSCOPY WITH DEBRIDEMENT AND ROTATOR CUFF REPAIR on 01/23/2014   Consultants:    Discharged Condition: Improved  Hospital Course: [name] [name] is an 80 y.o. male who was admitted 01/23/2014 for operative treatment ofRotator cuff tear, right. Patient has severe unremitting pain that affects sleep, daily activities, and work/hobbies. After pre-op clearance the patient was taken to the operating room on 01/23/2014 and underwent  Procedure(s): RIGHT SHOULDER ARTHROSCOPY WITH DEBRIDEMENT AND ROTATOR CUFF REPAIR.    Patient was given perioperative antibiotics:     Anti-infectives   Start     Dose/Rate Route Frequency Ordered Stop   01/23/14 2000  clindamycin (CLEOCIN) IVPB 600 mg     600 mg 100 mL/hr over 30 Minutes Intravenous Every 6 hours 01/23/14 1640 01/24/14 0830   01/23/14 1003  clindamycin (CLEOCIN) 900 MG/50ML IVPB    Comments:  Ancil Boozer  : cabinet override      01/23/14 1003 01/23/14 1256   01/23/14 0600  clindamycin (CLEOCIN) IVPB 900 mg  Status:  Discontinued     900 mg 100 mL/hr over 30 Minutes Intravenous On call to O.R. 01/22/14 1422 01/23/14 1626  Patient was given sequential compression devices, early ambulation, and chemoprophylaxis to prevent DVT.  The night of surgery the patient had to be in and out cathed due to urinary retention.  The next morning he began to pass urine without difficulty.  He will contact his urologist Dr Katrine Coho if there are any difficulties.  Patient benefited maximally from hospital stay and there were no complications.    Recent vital signs:  Patient Vitals for the past 24 hrs:  BP Temp Temp src Pulse Resp SpO2 Height Weight  01/24/14 0538 130/61 mmHg 98.8 F (37.1 C) Oral 58 18 99 % - -  01/24/14 0144 137/68 mmHg 98.6 F (37 C) Oral 61 18 99 % - -  01/23/14 2039 134/62 mmHg 98.4 F (36.9 C) Oral 52 16 98 % - -  01/23/14 1700 - - - - - - 5\' 9"  (1.753 m) 88.451 kg (195 lb)  01/23/14  1615 171/69 mmHg 97.6 F (36.4 C) - 54 14 98 % - -  01/23/14 1600 162/67 mmHg - - 53 14 98 % - -  01/23/14 1545 168/69 mmHg - - 54 14 98 % - -  01/23/14 1530 158/67 mmHg - - 55 13 100 % - -  01/23/14 1515 153/71 mmHg - - 58 17 100 % - -  01/23/14 1500 162/70 mmHg - - 61 19 98 % - -  01/23/14 1445 150/68 mmHg 97.4 F (36.3 C) - 71 13 93 % - -  01/23/14 1235 180/67 mmHg - - 50 16 100 % - -  01/23/14 1225 181/68 mmHg - - 51 11 100 % - -  01/23/14 1217 178/68 mmHg - - 49 14 100 % - -  01/23/14 1201 199/74 mmHg - - - - 100 % - -  01/23/14 1153 - - - 51 9 100 % - -  01/23/14 1152 - - - 53 20 100 % - -  01/23/14 0945 160/66 mmHg 97.6 F (36.4 C) Oral 51 18 100 % - -     Recent laboratory studies:   Recent Labs  01/21/14 1313  INR 1.21     Discharge Medications:     Medication List    STOP taking these medications       naproxen sodium 220 MG tablet  Commonly known as:  ANAPROX     oxyCODONE-acetaminophen 5-325 MG per tablet  Commonly known as:  PERCOCET/ROXICET      TAKE these medications       acetaminophen 325 MG tablet  Commonly known as:  TYLENOL  Take 2 tablets (650 mg total) by mouth every 6 (six) hours as needed for mild pain (or Fever >/= 101).     aspirin 81 MG tablet  Take 81 mg by mouth 3 (three) times a week. Monday Wednesday and Friday.     atorvastatin 10 MG tablet  Commonly known as:  LIPITOR  Take 1 tablet (10 mg total) by mouth daily.     dabigatran 75 MG Caps capsule  Commonly known as:  PRADAXA  Take 1 capsule (75 mg total) by mouth every 12 (twelve) hours.     dronedarone 400 MG tablet  Commonly known as:  MULTAQ  Take 1 tablet (400 mg total) by mouth 2 (two) times daily with a meal.     metoprolol tartrate 25 MG tablet  Commonly known as:  LOPRESSOR  Take 1 tablet (25 mg total) by mouth 2 (two) times daily.     omeprazole 20  MG capsule  Commonly known as:  PRILOSEC  Take 20 mg by mouth. Take 1 tablet Monday, Wednesday, Friday, Saturday      oxyCODONE 5 MG immediate release tablet  Commonly known as:  Oxy IR/ROXICODONE  1-2 tablets every 4-6 hrs as needed for pain     SYSTANE OP  Place 1 drop into both eyes daily as needed (dry eyes).        Diagnostic Studies: Dg Chest 2 View  01/21/2014   CLINICAL DATA:  Preop chest x-ray for right rotator cuff repair.  EXAM: CHEST  2 VIEW  COMPARISON:  06/13/2010  FINDINGS: Heart size is within normal limits. The lungs are free of focal consolidations and pleural effusions. Note is made of significant mid thoracic degenerative changes. No suspicious lytic or blastic lesions are identified.  IMPRESSION: 1.  No evidence for acute cardiopulmonary abnormality. 2. Mid thoracic degenerative changes.   Electronically Signed   By: Shon Hale M.D.   On: 01/21/2014 14:38    Disposition: 01-Home or Self Care  Discharge Orders   Future Orders Complete By Expires   Call MD / Call 911  As directed    Comments:     If you experience chest pain or shortness of breath, CALL 911 and be transported to the hospital emergency room.  If you develope a fever above 101 F, pus (white drainage) or increased drainage or redness at the wound, or calf pain, call your surgeon's office.   Constipation Prevention  As directed    Comments:     Drink plenty of fluids.  Prune juice may be helpful.  You may use a stool softener, such as Colace (over the counter) 100 mg twice a day.  Use MiraLax (over the counter) for constipation as needed.   Diet - low sodium heart healthy  As directed    Discharge instructions  As directed    Comments:     Shouder arthroscopy, rotator cuff repair, subacromial decompression Care After Instructions Refer to this sheet in the next few weeks. These discharge instructions provide you with general information on caring for yourself after you leave the hospital. Your caregiver may also give you specific instructions. Your treatment has been planned according to the most current medical  practices available, but unavoidable complications sometimes occur. If you have any problems or questions after discharge, please call your caregiver. HOME INSTRUCTIONS You may resume a normal diet and activities as directed. Perform pendulum exercises as directed. You will start physical therapy 2-4 days after surgery Take showers instead of baths until informed otherwise.  Change bandages (dressings) in 3 days.  Swab wounds daily with betadine.  Wash shoulder with soap and water.  Pat dry.  Cover wounds with bandaids. Only take over-the-counter or prescription medicines for pain, discomfort, or fever as directed by your caregiver.  Wear your sling for the next 6 weeks unless otherwise instructed. Eat a well-balanced diet.  Avoid lifting or driving until you are instructed otherwise.  Make an appointment to see your caregiver for stitches (suture) or staple removal as directed.   SEEK MEDICAL CARE IF: You have swelling of your calf or leg.  You develop shortness of breath or chest pain.  You have redness, swelling, or increasing pain in the wound.  There is pus or any unusual drainage coming from the surgical site.  You notice a bad smell coming from the surgical site or dressing.  The surgical site breaks open after sutures or staples have  been removed.  There is persistent bleeding from the suture or staple line.  You are getting worse or are not improving.  You have any other questions or concerns.  SEEK IMMEDIATE MEDICAL CARE IF:  You have a fever greater than 101 You develop a rash.  You have difficulty breathing.  You develop any reaction or side effects to medicines given.  Your knee motion is decreasing rather than improving.  MAKE SURE YOU:  Understand these instructions.  Will watch your condition.  Will get help right away if you are not doing well or get worse.   Increase activity slowly as tolerated  As directed       Follow-up Information   Follow up with  Lorn Junes, MD On 01/31/2014. (appt time 2pm)    Specialty:  Orthopedic Surgery   Contact information:   [address] [place] [address] [phone]        Signed: Linda Hedges 01/24/2014, 8:42 AM

## 2014-01-24 NOTE — Progress Notes (Signed)
Patient has been voiding small amounts of urine at a time with urinary urgency.  He started to c/o abd pain and discomfort.  Did bladder scan at 0212; had 598 mL of urine.  Did I/O catheter at 0230 removed 650 mL of urine.  Patient is more comfortable and is currently resting.  Will continue to monitor.

## 2014-01-24 NOTE — Discharge Instructions (Signed)
Information on my medicine - Pradaxa (dabigatran)  Why was Pradaxa prescribed for you? Pradaxa was prescribed for you to reduce the risk of forming blood clots that cause a stroke if you have a medical condition called atrial fibrillation (a type of irregular heartbeat).    What do you Need to know about PradAXa? Take your Pradaxa TWICE DAILY - one capsule in the morning and one tablet in the evening with or without food.  It would be best to take the doses about the same time each day.  The capsules should not be broken, chewed or opened - they must be swallowed whole.  Do not store Pradaxa in other medication containers - once the bottle is opened the Pradaxa should be used within FOUR months; throw away any capsules that havent been by that time.  Take Pradaxa exactly as prescribed by your doctor.  DO NOT stop taking Pradaxa without talking to the doctor who prescribed the medication.  Stopping without other stroke prevention medication to take the place of Pradaxa may increase your risk of developing a clot that causes a stroke.  Refill your prescription before you run out.  After discharge, you should have regular check-up appointments with your healthcare provider that is prescribing your Pradaxa.  In the future your dose may need to be changed if your kidney function or weight changes by a significant amount.  What do you do if you miss a dose? If you miss a dose, take it as soon as you remember on the same day.  If your next dose is less than 6 hours away, skip the missed dose.  Do not take two doses of PRADAXA at the same time.  Important Safety Information A possible side effect of Pradaxa is bleeding. You should call your healthcare provider right away if you experience any of the following:   Bleeding from an injury or your nose that does not stop.   Unusual colored urine (red or dark brown) or unusual colored stools (red or black).   Unusual bruising for unknown  reasons.   A serious fall or if you hit your head (even if there is no bleeding).  Some medicines may interact with Pradaxa and might increase your risk of bleeding or clotting while on Pradaxa. To help avoid this, consult your healthcare provider or pharmacist prior to using any new prescription or non-prescription medications, including herbals, vitamins, non-steroidal anti-inflammatory drugs (NSAIDs) and supplements.  This website has more information on Pradaxa (dabigatran): www.[employer].cz.

## 2014-01-24 NOTE — Progress Notes (Signed)
Occupational Therapy Evaluation and Discharge Patient Details Name: [name] [name] MRN: [id number] DOB: 12-Sep-1925 Today's Date: 01/24/2014 Time: 3474-2595 OT Time Calculation (min): 35 min  OT Assessment / Plan / Recommendation History of present illness Pt s/p shoulder arthroscopy with rotator cuff repair   Clinical Impression   PTA pt lived at home but has support from family and was Independent in ADLs and mobility. Education and training completed with pt and daughter regarding compensatory techniques for UB dressing and bathing. Pt and daughter educated in sling wear and care and donning/doffing. Pt was min A for sling management but will have assistance at home. Pt performed pendulum exercises with VC's for not moving R arm. Pt will have 24/7 assistance from family and home CNAs to assist with ADLs as needed. No further OT needs at this time.     OT Assessment  Patient does not need any further OT services    Follow Up Recommendations  No OT follow up;Supervision/Assistance - 24 hour       Equipment Recommendations  None recommended by OT          Precautions / Restrictions Precautions Precautions: Shoulder Type of Shoulder Precautions: No AROM; sling on at all times, Pendulums Shoulder Interventions: Timmothy Sours joy ultra sling;At all times;Off for dressing/bathing/exercises Precaution Comments: Educated pt on precautions per MD and discussed incorporating precautions into ADLs Required Braces or Orthoses: Sling Restrictions Weight Bearing Restrictions: Yes RUE Weight Bearing: Non weight bearing   Pertinent Vitals/Pain 2/10 in R shoulder at beginning of session, 1/10 in R shoulder at end of session    ADL  Eating/Feeding: Set up Where Assessed - Eating/Feeding: Chair Grooming: Set up Where Assessed - Grooming: Unsupported standing Upper Body Bathing: Minimal assistance Where Assessed - Upper Body Bathing: Unsupported sitting Lower Body Bathing: Minimal assistance Where  Assessed - Lower Body Bathing: Unsupported sit to stand Upper Body Dressing: Moderate assistance Where Assessed - Upper Body Dressing: Unsupported sitting Lower Body Dressing: Moderate assistance Where Assessed - Lower Body Dressing: Unsupported sit to stand Toilet Transfer: Supervision/safety Toilet Transfer Method: Sit to Loss adjuster, chartered: Comfort height toilet Toileting - Clothing Manipulation and Hygiene: Minimal assistance (for buttoning pants) Where Assessed - Toileting Clothing Manipulation and Hygiene: Sit to stand from 3-in-1 or toilet Tub/Shower Transfer: Supervision/safety Tub/Shower Transfer Method: Therapist, art: Walk in Engineer, site Used: Other (comment) (don joy ultra sling) Transfers/Ambulation Related to ADLs: Pt at supervision level for ambulation, mainly for balance during sit>stand ADL Comments: Pt previously with assistance for ADLs from [facility] nurse; limited due to R shoulder limitations        Visit Information  Last OT Received On: 01/24/14 Assistance Needed: +1 History of Present Illness: Pt s/p shoulder arthroscopy with rotator cuff repair       Prior [place] expects to be discharged to:: Private residence Living Arrangements: Alone Available Help at Discharge: Family;Available 24 hours/day;Home health Home Equipment: Grab bars - tub/shower Prior Function Level of Independence: Independent Communication Communication: No difficulties Dominant Hand: Right         Vision/Perception Vision - History Patient Visual Report: No change from baseline   Cognition  Cognition Arousal/Alertness: Awake/alert Behavior During Therapy: WFL for tasks assessed/performed Overall Cognitive Status: Within Functional Limits for tasks assessed    Extremity/Trunk Assessment Upper Extremity Assessment Upper Extremity Assessment: RUE deficits/detail RUE Deficits / Details: shoulder  precautions, no AROM/sling at all times/ pendulums     Mobility Bed  Mobility Overal bed mobility: Needs Assistance Bed Mobility: Supine to Sit Supine to sit: Min assist Transfers Overall transfer level: Needs assistance Equipment used: None Transfers: Sit to/from Stand Sit to Stand: Supervision     Exercise Shoulder Exercises Pendulum Exercise: PROM;Right;10 reps;Standing Elbow Flexion: AROM;Right;10 reps;Standing Elbow Extension: AROM;Right;10 reps;Standing Wrist Flexion: AROM;Right;10 reps;Standing;Seated Wrist Extension: AROM;Right;10 reps;Seated;Standing Digit Composite Flexion: AROM;Right;10 reps;Seated;Standing Composite Extension: AROM;Right;10 reps;Seated;Standing Donning/doffing shirt without moving shoulder: Minimal assistance Method for sponge bathing under operated UE: Supervision/safety Donning/doffing sling/immobilizer: Minimal assistance Correct positioning of sling/immobilizer: Minimal assistance Pendulum exercises (written home exercise program): Supervision/safety ROM for elbow, wrist and digits of operated UE: Supervision/safety Sling wearing schedule (on at all times/off for ADL's): Independent Proper positioning of operated UE when showering: Independent Positioning of UE while sleeping: Independent      End of Session OT - End of Session Equipment Utilized During Treatment: Other (comment) (don joy ultra sling) Activity Tolerance: Patient tolerated treatment well Patient left: in chair;with family/visitor present;Other (comment) (able to walk hallways until d/c) Nurse Communication: Other (comment) (ready for D/C from OT)  GO Functional Assessment Tool Used: clinical observation Functional Limitation: Self care Self Care Current Status (E3662): At least 20 percent but less than 40 percent impaired, limited or restricted Self Care Goal Status (H4765): At least 1 percent but less than 20 percent impaired, limited or restricted Self Care Discharge Status  [phone]): At least 1 percent but less than 20 percent impaired, limited or restricted   Juluis Rainier [phone] 01/24/2014, 10:51 AM

## 2014-01-25 NOTE — Op Note (Signed)
NAMEMarland Kitchen  [name] [name], [name] [name] NO.:  [id number]  MEDICAL RECORD NO.:  [id number]  LOCATION:  [id number]                        FACILITY:  [facility]  PHYSICIAN:  Elana Alm. Thurston Hole, M.D. DATE OF BIRTH:  1925/05/25  DATE OF PROCEDURE:  01/23/2014 DATE OF DISCHARGE:  01/24/2014                              OPERATIVE REPORT   PREOPERATIVE DIAGNOSES: 1. Right shoulder rotator cuff tear. 2. Right shoulder partial labrum tear with partial biceps tendon tear. 3. Right shoulder impingement.  POSTOPERATIVE DIAGNOSES: 1. Right shoulder rotator cuff tear. 2. Right shoulder partial labrum tear with partial biceps tendon tear. 3. Right shoulder impingement.  PROCEDURE: 1. Right shoulder EUA followed by arthroscopically-assisted rotator     cuff repair using Arthrex SwiveLock anchor x1 with fiber tape and     suture cinch x1. 2. Right shoulder labrum tear and biceps tendon partial tear     debridement. 3. Right shoulder subacromial decompression.  SURGEON:  Elana Alm. Thurston Hole, M.D.  ASSISTANT:  Kirstin Shepperson, PA-C.  ANESTHESIA:  General.  OPERATIVE TIME:  One hour.  COMPLICATIONS:  None.  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  He has failed conservative care and is now to undergo arthroscopy.  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  He was placed on the operating table in a supine position. After being placed under general anesthesia, his right shoulder was examined.  He had full range of motion and his shoulder was stable to ligamentous exam.  He was then placed in a beach-chair position and his shoulder and arm was prepped using sterile DuraPrep and draped using sterile technique.  Time-out procedure  was called and the correct right shoulder identified.  Initially, through a posterior arthroscopic portal, the arthroscope with a pump attached was placed into an anterior portal, and arthroscopic probe was placed.  On initial inspection, the articular cartilage in the glenohumeral joint was intact.  He had partial tearing of the anterior, superior, and posterior labrum 25%, which was debrided.  The anterior-inferior labrum and anterior-inferior glenohumeral ligament complex was intact.  Biceps tendon anchor was slightly hypermobile, but was otherwise well attached.  The biceps tendon showed 30% to 40% partial tearing, which was debrided, but it was otherwise intact.  The rotator cuff showed a complete tear of the supraspinatus.  The rest of the rotator cuff showed mild tendinosis with 20% to 30% partial tearing in the supraspinatus, which was debrided. Teres minor and subscap were intact.  Inferior capsular recess free of pathology.  Subacromial space was entered and a lateral arthroscopic portal was made.  Large amount of thickened bursitis was resected. Impingement was noted and a subacromial decompression was carried out removing 6-8 mm of the undersurface of the anterior, anterolateral, anteromedial acromion, and CA ligament release carried out as well.  The [facility] joint was not pathologic and thus was not resected.  At this point, the rotator cuff was repaired through an accessory lateral portal.  One Arthrex fiber tape was placed plus a suture cinch in a mattress suture technique through the rotator cuff tear and then a SwiveLock was used and deployed in the lateral aspect of the greater tuberosity with firm and tight fixation.  After this was done, the shoulder could be brought through a full range of motion with no impingement on the repair with excellent stability.  At this point, it was felt that all pathology had been satisfactorily addressed.  The instruments were removed.   Portal was closed with 3-0 nylon suture.  Sterile dressings were applied.  The patient awakened and taken to the recovery room in a stable condition. Needle and sponge counts were correct x2 at the end of the case.     Elai Vanwyk A. Thurston Hole, M.D.     RAW/MEDQ  D:  01/24/2014  T:  01/25/2014  Job:  [id number]

## 2014-01-28 ENCOUNTER — Ambulatory Visit: Payer: Medicare PPO | Admitting: Cardiology

## 2014-02-08 ENCOUNTER — Other Ambulatory Visit: Payer: Self-pay

## 2014-02-08 MED ORDER — ATORVASTATIN CALCIUM 10 MG PO TABS: 10.0000 mg | ORAL_TABLET | Freq: Every day | ORAL | Status: DC

## 2014-02-13 ENCOUNTER — Other Ambulatory Visit: Payer: Self-pay | Admitting: Oncology

## 2014-02-13 DIAGNOSIS — D472 Monoclonal gammopathy: Secondary | ICD-10-CM

## 2014-02-13 DIAGNOSIS — N289 Disorder of kidney and ureter, unspecified: Secondary | ICD-10-CM

## 2014-02-13 DIAGNOSIS — C61 Malignant neoplasm of prostate: Secondary | ICD-10-CM

## 2014-03-07 ENCOUNTER — Other Ambulatory Visit: Payer: Self-pay

## 2014-03-07 MED ORDER — DRONEDARONE HCL 400 MG PO TABS: 400.0000 mg | ORAL_TABLET | Freq: Two times a day (BID) | ORAL | Status: DC

## 2014-06-07 ENCOUNTER — Emergency Department (HOSPITAL_COMMUNITY): Payer: Medicare PPO

## 2014-06-07 ENCOUNTER — Encounter (HOSPITAL_COMMUNITY): Payer: Self-pay | Admitting: Emergency Medicine

## 2014-06-07 ENCOUNTER — Observation Stay (HOSPITAL_COMMUNITY)
Admission: EM | Admit: 2014-06-07 | Discharge: 2014-06-10 | Disposition: A | Discharge status: Home / Self Care | Payer: Medicare PPO | Attending: General Surgery | Admitting: General Surgery

## 2014-06-07 DIAGNOSIS — S20219A Contusion of unspecified front wall of thorax, initial encounter: Secondary | ICD-10-CM

## 2014-06-07 DIAGNOSIS — W19XXXA Unspecified fall, initial encounter: Secondary | ICD-10-CM

## 2014-06-07 DIAGNOSIS — Z8546 Personal history of malignant neoplasm of prostate: Secondary | ICD-10-CM | POA: Insufficient documentation

## 2014-06-07 DIAGNOSIS — IMO0002 Reserved for concepts with insufficient information to code with codable children: Secondary | ICD-10-CM | POA: Insufficient documentation

## 2014-06-07 DIAGNOSIS — S02109A Fracture of base of skull, unspecified side, initial encounter for closed fracture: Secondary | ICD-10-CM | POA: Diagnosis not present

## 2014-06-07 DIAGNOSIS — Z8673 Personal history of transient ischemic attack (TIA), and cerebral infarction without residual deficits: Secondary | ICD-10-CM | POA: Diagnosis not present

## 2014-06-07 DIAGNOSIS — S61209A Unspecified open wound of unspecified finger without damage to nail, initial encounter: Secondary | ICD-10-CM | POA: Insufficient documentation

## 2014-06-07 DIAGNOSIS — I4891 Unspecified atrial fibrillation: Secondary | ICD-10-CM | POA: Diagnosis not present

## 2014-06-07 DIAGNOSIS — S02400A Malar fracture unspecified, initial encounter for closed fracture: Secondary | ICD-10-CM | POA: Diagnosis not present

## 2014-06-07 DIAGNOSIS — I482 Chronic atrial fibrillation, unspecified: Secondary | ICD-10-CM

## 2014-06-07 DIAGNOSIS — R791 Abnormal coagulation profile: Secondary | ICD-10-CM | POA: Insufficient documentation

## 2014-06-07 DIAGNOSIS — R040 Epistaxis: Secondary | ICD-10-CM | POA: Diagnosis not present

## 2014-06-07 DIAGNOSIS — Y92009 Unspecified place in unspecified non-institutional (private) residence as the place of occurrence of the external cause: Secondary | ICD-10-CM | POA: Diagnosis not present

## 2014-06-07 DIAGNOSIS — Z7901 Long term (current) use of anticoagulants: Secondary | ICD-10-CM | POA: Insufficient documentation

## 2014-06-07 DIAGNOSIS — E785 Hyperlipidemia, unspecified: Secondary | ICD-10-CM | POA: Insufficient documentation

## 2014-06-07 DIAGNOSIS — Z87891 Personal history of nicotine dependence: Secondary | ICD-10-CM | POA: Diagnosis not present

## 2014-06-07 DIAGNOSIS — S02401A Maxillary fracture, unspecified, initial encounter for closed fracture: Secondary | ICD-10-CM | POA: Insufficient documentation

## 2014-06-07 DIAGNOSIS — S01501A Unspecified open wound of lip, initial encounter: Secondary | ICD-10-CM | POA: Diagnosis not present

## 2014-06-07 DIAGNOSIS — S0120XA Unspecified open wound of nose, initial encounter: Secondary | ICD-10-CM | POA: Insufficient documentation

## 2014-06-07 DIAGNOSIS — D62 Acute posthemorrhagic anemia: Secondary | ICD-10-CM

## 2014-06-07 DIAGNOSIS — S022XXA Fracture of nasal bones, initial encounter for closed fracture: Secondary | ICD-10-CM | POA: Insufficient documentation

## 2014-06-07 DIAGNOSIS — N182 Chronic kidney disease, stage 2 (mild): Secondary | ICD-10-CM | POA: Insufficient documentation

## 2014-06-07 DIAGNOSIS — Z7982 Long term (current) use of aspirin: Secondary | ICD-10-CM | POA: Diagnosis not present

## 2014-06-07 DIAGNOSIS — S0180XA Unspecified open wound of other part of head, initial encounter: Secondary | ICD-10-CM | POA: Insufficient documentation

## 2014-06-07 DIAGNOSIS — I2510 Atherosclerotic heart disease of native coronary artery without angina pectoris: Secondary | ICD-10-CM | POA: Insufficient documentation

## 2014-06-07 DIAGNOSIS — W010XXA Fall on same level from slipping, tripping and stumbling without subsequent striking against object, initial encounter: Secondary | ICD-10-CM | POA: Diagnosis not present

## 2014-06-07 DIAGNOSIS — D649 Anemia, unspecified: Secondary | ICD-10-CM | POA: Diagnosis not present

## 2014-06-07 DIAGNOSIS — S0292XA Unspecified fracture of facial bones, initial encounter for closed fracture: Secondary | ICD-10-CM

## 2014-06-07 LAB — ETHANOL

## 2014-06-07 LAB — BASIC METABOLIC PANEL
ANION GAP: 12 (ref 5–15)
BUN: 21 mg/dL (ref 6–23)
CHLORIDE: 103 meq/L (ref 96–112)
CO2: 23 mEq/L (ref 19–32)
Calcium: 8.6 mg/dL (ref 8.4–10.5)
Creatinine, Ser: 1.55 mg/dL — ABNORMAL HIGH (ref 0.50–1.35)
GFR calc Af Amer: 45 mL/min — ABNORMAL LOW (ref >90)
GFR, EST NON AFRICAN AMERICAN: 39 mL/min — AB (ref >90)
Glucose, Bld: 120 mg/dL — ABNORMAL HIGH (ref 70–99)
POTASSIUM: 4.4 meq/L (ref 3.7–5.3)
SODIUM: 138 meq/L (ref 137–147)

## 2014-06-07 LAB — CBC
HEMATOCRIT: 31.3 % — AB (ref 39.0–52.0)
Hemoglobin: 10.3 g/dL — ABNORMAL LOW (ref 13.0–17.0)
MCH: 32.5 pg (ref 26.0–34.0)
MCHC: 32.9 g/dL (ref 30.0–36.0)
MCV: 98.7 fL (ref 78.0–100.0)
PLATELETS: 209 10*3/uL (ref 150–400)
RBC: 3.17 MIL/uL — ABNORMAL LOW (ref 4.22–5.81)
RDW: 13.8 % (ref 11.5–15.5)
WBC: 10.2 10*3/uL (ref 4.0–10.5)

## 2014-06-07 LAB — PROTIME-INR
INR: 1.51 — AB (ref 0.00–1.49)
PROTHROMBIN TIME: 18.2 s — AB (ref 11.6–15.2)

## 2014-06-07 MED ORDER — HYDROMORPHONE HCL 2 MG PO TABS
2.0000 mg | ORAL_TABLET | Freq: Once | ORAL | Status: AC
  Administered 2014-06-07: 2 mg via ORAL
  Filled 2014-06-07: qty 1

## 2014-06-07 MED ORDER — OXYMETAZOLINE HCL 0.05 % NA SOLN
2.0000 | Freq: Once | NASAL | Status: AC
  Administered 2014-06-07: 2 via NASAL
  Filled 2014-06-07: qty 15

## 2014-06-07 MED ORDER — OXYMETAZOLINE HCL 0.05 % NA SOLN
2.0000 | Freq: Three times a day (TID) | NASAL | Status: DC
  Administered 2014-06-08 – 2014-06-10 (×3): 2 via NASAL

## 2014-06-07 NOTE — ED Provider Notes (Signed)
CSN: [id number]     Arrival date & time 06/07/14  1526 History   First MD Initiated Contact with Patient 06/07/14 1527     Chief Complaint  Patient presents with  . Fall  . Facial Laceration    nose  . Epistaxis     (Consider location/radiation/quality/duration/timing/severity/associated sxs/prior Treatment) Patient is a 80 y.o. male presenting with fall and nosebleeds.  Fall This is a new problem. The current episode started today. The problem occurs constantly. The problem has been unchanged. Pertinent negatives include no abdominal pain, chest pain, chills, coughing, fever, headaches, nausea, neck pain, rash, sore throat or vomiting. Nothing aggravates the symptoms. He has tried nothing for the symptoms.  Epistaxis Associated symptoms: no cough, no fever, no headaches and no sore throat     Past Medical History  Diagnosis Date  . Coronary artery disease     minimal, cath 2005 / catheterization August, 2011, 30% in 3 vessels, normal LV function  . Hyperlipidemia   . Atrial fibrillation 2011, 2015    Multaq Started September, 2011, dose adjusted October, 2011, 200 mg a.m., 400 mg p.o.  . Monoclonal gammopathy     granfortuna  . TIA (transient ischemic attack)     Dr. Erling Cruz,, question in the past. when off ASA for 10 days  . GERD (gastroesophageal reflux disease)     Esophageal dilatation in 1992, some esophageal spasm  . S/P transurethral resection of prostate 2004    2004  . MR (mitral regurgitation) 2009    mild, Echo, July, 2011  . Cervical spine disease   . Renal insufficiency     Prior creatinine 1.2 /  September, 2011.. 1.7  /  October, 2011.. 1.8  . Ejection fraction     EF 60%, echo, 2009  /  TEE normal August, 2011 /   EF 60%, echo, July, 2011  . Drug therapy     Pradaxa Started July, 2011  . Aortic stenosis     Mild, echo, July, 2011  . Aortic insufficiency     Mild, echo, July, 2011  . Carotid artery disease     Doppler, 2008 no significant abnormality  .  Diarrhea     Chronic  . Incomplete right bundle branch block     August, 2012  . Prostate cancer 11/22/2011  . Painless hematuria 03/27/2012    Occurred x 2 5/12 & 5/13 on Pradaxa 75 mg BID  . Bradycardia     Sinus bradycardia while on 25 Lopressor twice a day October, 2013  . Memory change     June, 2014  . Leg fatigue     June, 2014   Past Surgical History  Procedure Laterality Date  . History of turp    . Hernia repair    . Cataract extraction    . Fibular fracture repair    . Cardioversion  08/01/2012    Procedure: CARDIOVERSION;  Surgeon: Carlena Bjornstad, MD;  Location: [facility] ENDOSCOPY;  Service: Cardiovascular;  Laterality: N/A;  . Cardioversion N/A 11/29/2013    Procedure: CARDIOVERSION;  Surgeon: Carlena Bjornstad, MD;  Location: [facility] ENDOSCOPY;  Service: Cardiovascular;  Laterality: N/A;  . Colonoscopy    . Cardiac catheterization    . Eye surgery Bilateral     cataract extraction  . Shoulder arthroscopy with rotator cuff repair Right 01/23/2014    DR Noemi Chapel   . Shoulder arthroscopy with rotator cuff repair Right 01/23/2014    Procedure: RIGHT SHOULDER ARTHROSCOPY WITH DEBRIDEMENT AND  ROTATOR CUFF REPAIR;  Surgeon: Lorn Junes, MD;  Location: [place];  Service: Orthopedics;  Laterality: Right;   History reviewed. No pertinent family history. History  Substance Use Topics  . Smoking status: Former Research scientist (life sciences)  . Smokeless tobacco: Former Systems developer    Quit date: 08/01/1997  . Alcohol Use: 1.2 oz/week    1 Glasses of wine, 1 Shots of liquor per week    Review of Systems  Constitutional: Negative for fever and chills.  HENT: Positive for nosebleeds. Negative for sore throat.   Eyes: Negative for pain.  Respiratory: Negative for cough and shortness of breath.   Cardiovascular: Negative for chest pain.  Gastrointestinal: Negative for nausea, vomiting and abdominal pain.  Genitourinary: Negative for dysuria and flank pain.  Musculoskeletal: Negative for back pain and neck pain.  Skin:  Negative for rash.  Neurological: Negative for seizures and headaches.      Allergies  Guaifenesin & derivatives; Penicillins; and Chlorhexidine  Home Medications   Prior to Admission medications   Medication Sig Start Date End Date Taking? Authorizing Provider  aspirin 81 MG tablet Take 81 mg by mouth 3 (three) times a week. Monday Wednesday and Friday.   Yes Historical Provider, MD  atorvastatin (LIPITOR) 10 MG tablet Take 1 tablet (10 mg total) by mouth daily. 02/08/14  Yes Carlena Bjornstad, MD  dabigatran (PRADAXA) 75 MG CAPS capsule Take 1 capsule (75 mg total) by mouth every 12 (twelve) hours. 12/03/13  Yes Carlena Bjornstad, MD  dronedarone (MULTAQ) 400 MG tablet Take 1 tablet (400 mg total) by mouth 2 (two) times daily with a meal. 03/07/14  Yes Carlena Bjornstad, MD  metoprolol tartrate (LOPRESSOR) 25 MG tablet Take 1 tablet (25 mg total) by mouth 2 (two) times daily. 08/20/13  Yes Carlena Bjornstad, MD  omeprazole (PRILOSEC) 20 MG capsule Take 20 mg by mouth. Take 1 tablet Monday, Wednesday, Friday, Saturday   Yes Historical Provider, MD   BP 136/90  Pulse 67  Temp(Src) 98.7 F (37.1 C) (Oral)  Resp 19  Ht 5\' 10"  (1.778 m)  Wt 198 lb 6.6 oz (90 kg)  BMI 28.47 kg/m2  SpO2 98% Physical Exam  Constitutional: He is oriented to person, place, and time. He appears well-developed and well-nourished. No distress.  HENT:  Head: Normocephalic. Head is with abrasion and with laceration.  Abrasions to L cheek, small 1 cm laceration above L eyebrow, Lip laceration to upper lip not involving vermillion border  Eyes: Pupils are equal, round, and reactive to light.  Neck: Normal range of motion.  Cardiovascular: Normal rate and regular rhythm.   Pulmonary/Chest: Effort normal and breath sounds normal.  Abdominal: Soft. He exhibits no distension. There is no tenderness.  Musculoskeletal: Normal range of motion.       Left knee: He exhibits laceration. He exhibits no bony tenderness. No tenderness  found.       Left hand: He exhibits tenderness and bony tenderness. He exhibits normal range of motion.  Neurological: He is alert and oriented to person, place, and time.  Skin: Skin is warm. He is not diaphoretic.    ED Course  LACERATION REPAIR Date/Time: 06/08/2014 12:32 AM Performed by: Freddi Che Authorized by: Veryl Speak Consent: Verbal consent obtained. Body area: head/neck (finger) Local anesthetic: lidocaine 2% with epinephrine and lidocaine 2% without epinephrine Comments: Multiple lacerations repaired. Facial laceration above left eyebrow repaired with 4 6-0 Prolene sutures. Lip laceration on the upper (involving the vermilion border repaired  with 2 4-0 chromic gut sutures. Small soft tissue defect on the anterior nasal philtrum repaired with 1 Prolene 6-0 suture. Left PT laceration without involvement of the tendons repaired with 8 6-0 Prolene sutures.    (including critical care time) Labs Review Labs Reviewed  CBC - Abnormal; Notable for the following:    RBC 3.17 (*)    Hemoglobin 10.3 (*)    HCT 31.3 (*)    All other components within normal limits  BASIC METABOLIC PANEL - Abnormal; Notable for the following:    Glucose, Bld 120 (*)    Creatinine, Ser 1.55 (*)    GFR calc non Af Amer 39 (*)    GFR calc Af Amer 45 (*)    All other components within normal limits  PROTIME-INR - Abnormal; Notable for the following:    Prothrombin Time 18.2 (*)    INR 1.51 (*)    All other components within normal limits  ETHANOL    Imaging Review Ct Head Wo Contrast  06/07/2014   CLINICAL DATA:  FALL FACIAL LACERATION EPISTAXIS  EXAM: CT HEAD WITHOUT CONTRAST  CT MAXILLOFACIAL WITHOUT CONTRAST  TECHNIQUE: Multidetector CT imaging of the head and maxillofacial structures were performed using the standard protocol without intravenous contrast. Multiplanar CT image reconstructions of the maxillofacial structures were also generated.  COMPARISON:  None.  FINDINGS: CT HEAD  FINDINGS  Opacification of left maxillary sinus with fracture of anterior and lateral walls. Near complete opacification of right maxillary sinus with fracture of anterior and lateral walls. Gas in the right masseter space probably related to maxillary fracture.  Moderate diffuse parenchymal atrophy. Negative for acute intracranial hemorrhage, mass lesion, acute infarction, midline shift, or mass-effect. Acute infarct may be inapparent on noncontrast CT. Ventricles and sulci symmetric. Bone windows demonstrate no focal lesion.  CT MAXILLOFACIAL FINDINGS  Minimally comminuted right nasal bone fracture. Minimally comminuted nasal septal fracture. Fracture of the anterior and lateral walls of bilateral maxillary sinuses. Fracture of bilateral pterygoid plates. Orbital floors intact. Zygomatic arches intact. Mandible intact. Temporomandibular joints seated.  IMPRESSION: 1. Bilateral LeFort type 1 facial fractures. 2. Negative for bleed or other acute intracranial process.   Electronically Signed   By: Arne Cleveland M.D.   On: 06/07/2014 18:31   Dg Hand Complete Left  06/07/2014   CLINICAL DATA:  Fall with injury. Deep laceration tube little finger  EXAM: LEFT HAND - COMPLETE 3+ VIEW  COMPARISON:  None.  FINDINGS: No evidence for an acute fracture. No subluxation or dislocation. Loss of joint space is seen at the MCP joint of the index finger. There is advanced degenerative changes at the first carpometacarpal joint.  IMPRESSION: No acute bony abnormality.   Electronically Signed   By: Misty Stanley M.D.   On: 06/07/2014 18:04   Ct Maxillofacial Wo Cm  06/07/2014   CLINICAL DATA:  FALL FACIAL LACERATION EPISTAXIS  EXAM: CT HEAD WITHOUT CONTRAST  CT MAXILLOFACIAL WITHOUT CONTRAST  TECHNIQUE: Multidetector CT imaging of the head and maxillofacial structures were performed using the standard protocol without intravenous contrast. Multiplanar CT image reconstructions of the maxillofacial structures were also  generated.  COMPARISON:  None.  FINDINGS: CT HEAD FINDINGS  Opacification of left maxillary sinus with fracture of anterior and lateral walls. Near complete opacification of right maxillary sinus with fracture of anterior and lateral walls. Gas in the right masseter space probably related to maxillary fracture.  Moderate diffuse parenchymal atrophy. Negative for acute intracranial hemorrhage, mass lesion, acute infarction,  midline shift, or mass-effect. Acute infarct may be inapparent on noncontrast CT. Ventricles and sulci symmetric. Bone windows demonstrate no focal lesion.  CT MAXILLOFACIAL FINDINGS  Minimally comminuted right nasal bone fracture. Minimally comminuted nasal septal fracture. Fracture of the anterior and lateral walls of bilateral maxillary sinuses. Fracture of bilateral pterygoid plates. Orbital floors intact. Zygomatic arches intact. Mandible intact. Temporomandibular joints seated.  IMPRESSION: 1. Bilateral LeFort type 1 facial fractures. 2. Negative for bleed or other acute intracranial process.   Electronically Signed   By: Arne Cleveland M.D.   On: 06/07/2014 18:31     EKG Interpretation None      MDM   Final diagnoses:  Facial fracture due to fall, closed, initial encounter   80 year old male with a history of coronary artery disease, atrial fibrillation, hyperlipidemia, TIA presents today after a fall onto his face.  On arrival here, the patient is hemodynamically stable. Patient is having active epistaxis and has multiple abrasions lacerations and bruises to his face. Patient also sustained a laceration to his left pinky finger. Patient also with abrasions to his left knee. Given the patient's history, age, and used to Pradaxa and his clinical presentation, recommended CT scan of his head and his face. Initially patient denied these interventions and stated that he only wanted a x-ray of his left hand which he states was broken as well as an x-ray of his left  knee.  Multiple lacerations repaired as above. Patient had a laceration to his left eyebrow, lip, left finger. Patient with increasing pain and was finally amenable to having his head and face scans. Patient adamantly denied any neck pain or the need for any cervical spine imaging. Patient stated that he has multiple problems with the cervical spine and feels that plain films would be not helpful and that CT scan would be unnecessary at this time. Patient refuses cervical collar.  Patient with labs as above significant for mildly elevated INR. Patient had continued epistaxis refractory to Afrin. Left Rhino Rocket placed with improvement of epistaxis. Trauma consult for admission given the patient's head trauma times Pradaxa use. Patient admitted to the floor in stable condition. Patient seen and evaluated by myself and by the attending Dr. Stark Jock.      Freddi Che, MD 06/08/14 0040

## 2014-06-07 NOTE — ED Notes (Signed)
MD at bedside. Delo

## 2014-06-07 NOTE — ED Notes (Signed)
LACERATIONS  NOSE  UPPER LIP LEFT EYEBROW   All bloody. Pt insist on putting ice to areas of trauma.

## 2014-06-07 NOTE — ED Notes (Signed)
Suction hooked up for pt use.

## 2014-06-07 NOTE — ED Notes (Signed)
ENT at bedside

## 2014-06-07 NOTE — ED Notes (Signed)
Patient transported to X-ray 

## 2014-06-07 NOTE — ED Notes (Signed)
80 yo from home via EMS had a fall at home. Pt was carrying 25 lb of potting soil when tripped and fell face first on to porch made of rocks. Left Hand appears to be fractured with 2 lacerations, Nose Lac, Left Knee and Elbow Lacs. Pt is currently on Pradaxa. Vitals stable. Pt denies LOC. A/O X3.

## 2014-06-07 NOTE — Consult Note (Addendum)
[name] [name], [place] [id number] Jun 11, 1925 Trauma Md, MD  Reason for Consult: Lefort I fracture, nasal fracture, and left epistaxis s/p fall  HPI: 80yo male who reportedly tripped and fell today, struck face on steps. Ct maxillofacial shows nondisplaced bilateral Lefort I and nondisplaced nasal and septal fractures with expected hemosinus, patient has had left epistaxis that resolved with a left nasal pack. ENT was consulted for the facial fractures and left epistaxis.  Allergies:  Allergies  Allergen Reactions  . Guaifenesin & Derivatives Other (See Comments)    Unknown  . Penicillins Other (See Comments)    Unknown  . Chlorhexidine Rash    ROS: Review of systems + for facial pain, subjective malocclusion, hand pain, left epistaxis (resolved),otherwise normal x 12 systems except per HPI.  PMH:  Past Medical History  Diagnosis Date  . Coronary artery disease     minimal, cath 2005 / catheterization August, 2011, 30% in 3 vessels, normal LV function  . Hyperlipidemia   . Atrial fibrillation 2011, 2015    Multaq Started September, 2011, dose adjusted October, 2011, 200 mg a.m., 400 mg p.o.  . Monoclonal gammopathy     granfortuna  . TIA (transient ischemic attack)     Dr. Erling Cruz,, question in the past. when off ASA for 10 days  . GERD (gastroesophageal reflux disease)     Esophageal dilatation in 1992, some esophageal spasm  . S/P transurethral resection of prostate 2004    2004  . MR (mitral regurgitation) 2009    mild, Echo, July, 2011  . Cervical spine disease   . Renal insufficiency     Prior creatinine 1.2 /  September, 2011.. 1.7  /  October, 2011.. 1.8  . Ejection fraction     EF 60%, echo, 2009  /  TEE normal August, 2011 /   EF 60%, echo, July, 2011  . Drug therapy     Pradaxa Started July, 2011  . Aortic stenosis     Mild, echo, July, 2011  . Aortic insufficiency     Mild, echo, July, 2011  . Carotid artery disease     Doppler, 2008 no significant abnormality  .  Diarrhea     Chronic  . Incomplete right bundle branch block     August, 2012  . Prostate cancer 11/22/2011  . Painless hematuria 03/27/2012    Occurred x 2 5/12 & 5/13 on Pradaxa 75 mg BID  . Bradycardia     Sinus bradycardia while on 25 Lopressor twice a day October, 2013  . Memory change     June, 2014  . Leg fatigue     June, 2014    FH: History reviewed. No pertinent family history.  SH:  History   Social History  . Marital Status: Widowed    Spouse Name: N/A    Number of Children: N/A  . Years of Education: N/A   Occupational History  . Not on file.   Social History Main Topics  . Smoking status: Former Research scientist (life sciences)  . Smokeless tobacco: Former Systems developer    Quit date: 08/01/1997  . Alcohol Use: 1.2 oz/week    1 Glasses of wine, 1 Shots of liquor per week  . Drug Use: No  . Sexual Activity: Not on file   Other Topics Concern  . Not on file   Social History Narrative  . No narrative on file    PSH:  Past Surgical History  Procedure Laterality Date  . History of turp    .  Hernia repair    . Cataract extraction    . Fibular fracture repair    . Cardioversion  08/01/2012    Procedure: CARDIOVERSION;  Surgeon: Carlena Bjornstad, MD;  Location: [facility] ENDOSCOPY;  Service: Cardiovascular;  Laterality: N/A;  . Cardioversion N/A 11/29/2013    Procedure: CARDIOVERSION;  Surgeon: Carlena Bjornstad, MD;  Location: [facility] ENDOSCOPY;  Service: Cardiovascular;  Laterality: N/A;  . Colonoscopy    . Cardiac catheterization    . Eye surgery Bilateral     cataract extraction  . Shoulder arthroscopy with rotator cuff repair Right 01/23/2014    DR Noemi Chapel   . Shoulder arthroscopy with rotator cuff repair Right 01/23/2014    Procedure: RIGHT SHOULDER ARTHROSCOPY WITH DEBRIDEMENT AND ROTATOR CUFF REPAIR;  Surgeon: Lorn Junes, MD;  Location: [place];  Service: Orthopedics;  Laterality: Right;    Physical  Exam: CN 2-12 grossly intact and symmetric other than expected mild midfacial  hypoesthesia. EAC/TMs normal BL. Repaired lacerations and mild abrasions of upper lip, nose, face. patient has minimal mobility of the maxillary alveloar ridge/palate with palpation. Patient complains of subjective malocclusion but on exam he is in class I occlusion with the mesiobuccal cusps of the maxillary 1st molars intercuspating with the buccal grooves of the mandibular 1st molars. Nose with no obvious cosmetic deformity. Nasal cavity hemostatic on the right, there is a clean and dry nasal pack secure on the left without active epistaxis. EOMI, PERRLA. Neck supple with midline trachea.  A/P: epistaxis: I extensively discussed with patient that usual management with resolved epistaxis is to leave the current pack in place and see him back in the office in 5 to 7 days for removal of the pack and nasal endoscopy. Removal of the pack now might precipitate more epistaxis which would require a trip to the OR and more packing. He is fine with having the pack removed in 5 to 7 days in the office. He can have amoxicillin or some other antibiotic to cover strep while the packing is in place.  Nondisplaced nasal and Lefort I fractures. I explained to the patient that his subjective occlusion is due to the minimal/mild mobility of the acute fractures. This will resolve in 4-6 weeks once the Lefort fractures are healed and can be managed with a liquid or soft NO-CHEW diet for 6 weeks. I discussed the alternative which would be taking him to the OR tonight for sublabial approach to the fractures with titanium mini-plates placed on the anterior maxillary wall fractures. The long-term functional and cosmetic result will likely be the same. He considered this and would like to avoid surgery. He would like to let the fractures heal with observation and soft/liquid diet for 6 weeks. He can follow up at [facility] in 5-7 days for nasal packing removal and avoid blowing his nose for 2 weeks as this can cause facial  emphysema or recurrent epistaxis.  Patient apparently blew his nasal packing out. Can use Afrin bilaterally for 3-5 days and avoid nose blowing. If he has no further bleeding with the nasal packing out can just follow up as needed.   Ruby Cola 06/07/2014 10:48 PM

## 2014-06-07 NOTE — ED Notes (Signed)
Attempted report 

## 2014-06-07 NOTE — ED Notes (Signed)
MD at bedside. 

## 2014-06-07 NOTE — ED Notes (Signed)
Suture Cart placed at bedside

## 2014-06-07 NOTE — ED Notes (Signed)
Transporting patient to new room assignment. 

## 2014-06-07 NOTE — H&P (Signed)
History   [name] [name] is an 80 y.o. male.   Chief Complaint:  Chief Complaint  Patient presents with  . Fall  . Facial Laceration    nose  . Epistaxis   Cardiology - [place]  Fall Associated symptoms include chest pain (left chest). Pertinent negatives include no abdominal pain, coughing, headaches, myalgias, nausea, neck pain or vomiting.  Epistaxis Associated symptoms: no cough, no dizziness and no headaches   This is an 80 yo male with multiple medical issues presents after a fall on his face and left side.  He was carrying a bag of potting soil across his patio when he slipped and fell on his face.  "I kissed the patio."  No LOC.  Complaining only of pain in his face with considerable epistaxis and mild soreness left chest and left hand.  He is anticoagulated and required left RhinoRocket packing to stop the epistaxis on the left.  We are asked to admit the patient for observation due to the bleeding.  The patient has been trying to direct his own work-up and has refused to have any imaging of his neck.  He feels that too much money is wasted with unnecessary imaging.  He also refused an IV prior to my evaluation.  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  Past Medical History  Diagnosis Date  . Coronary artery disease     minimal, cath 2005 / catheterization August, 2011, 30% in 3 vessels, normal LV function  . Hyperlipidemia   . Atrial fibrillation 2011, 2015    Multaq Started September, 2011, dose adjusted October, 2011, 200 mg a.m., 400 mg p.o.  . Monoclonal gammopathy     granfortuna  . TIA (transient ischemic attack)     Dr. Erling Cruz,, question in the past. when off ASA for 10 days  . GERD (gastroesophageal reflux disease)     Esophageal dilatation in 1992, some esophageal spasm  . S/P transurethral resection of prostate 2004    2004  . MR (mitral regurgitation) 2009    mild, Echo, July, 2011  . Cervical spine disease   . Renal  insufficiency     Prior creatinine 1.2 /  September, 2011.. 1.7  /  October, 2011.. 1.8  . Ejection fraction     EF 60%, echo, 2009  /  TEE normal August, 2011 /   EF 60%, echo, July, 2011  . Drug therapy     Pradaxa Started July, 2011  . Aortic stenosis     Mild, echo, July, 2011  . Aortic insufficiency     Mild, echo, July, 2011  . Carotid artery disease     Doppler, 2008 no significant abnormality  . Diarrhea     Chronic  . Incomplete right bundle branch block     August, 2012  . Prostate cancer 11/22/2011  . Painless hematuria 03/27/2012    Occurred x 2 5/12 & 5/13 on Pradaxa 75 mg BID  . Bradycardia     Sinus bradycardia while on 25 Lopressor twice a day October, 2013  . Memory change     June, 2014  . Leg fatigue     June, 2014    Past Surgical History  Procedure Laterality Date  . History of turp    . Hernia repair    . Cataract extraction    . Fibular fracture repair    . Cardioversion  08/01/2012    Procedure: CARDIOVERSION;  Surgeon: Carlena Bjornstad,  MD;  Location: [facility];  Service: Cardiovascular;  Laterality: N/A;  . Cardioversion N/A 11/29/2013    Procedure: CARDIOVERSION;  Surgeon: Carlena Bjornstad, MD;  Location: [facility] ENDOSCOPY;  Service: Cardiovascular;  Laterality: N/A;  . Colonoscopy    . Cardiac catheterization    . Eye surgery Bilateral     cataract extraction  . Shoulder arthroscopy with rotator cuff repair Right 01/23/2014    DR Noemi Chapel   . Shoulder arthroscopy with rotator cuff repair Right 01/23/2014    Procedure: RIGHT SHOULDER ARTHROSCOPY WITH DEBRIDEMENT AND ROTATOR CUFF REPAIR;  Surgeon: Lorn Junes, MD;  Location: [facility];  Service: Orthopedics;  Laterality: Right;    History reviewed. No pertinent family history. Social History:  reports that he has quit smoking. He quit smokeless tobacco use about 16 years ago. He reports that he drinks about 1.2 ounces of alcohol per week. He reports that he does not use illicit drugs.  Allergies    Allergies  Allergen Reactions  . Guaifenesin & Derivatives Other (See Comments)    Unknown  . Penicillins Other (See Comments)    Unknown  . Chlorhexidine Rash    Home Medications   Prior to Admission medications   Medication Sig Start Date End Date Taking? Authorizing Provider  aspirin 81 MG tablet Take 81 mg by mouth 3 (three) times a week. Monday Wednesday and Friday.   Yes Historical Provider, MD  atorvastatin (LIPITOR) 10 MG tablet Take 1 tablet (10 mg total) by mouth daily. 02/08/14  Yes Carlena Bjornstad, MD  dabigatran (PRADAXA) 75 MG CAPS capsule Take 1 capsule (75 mg total) by mouth every 12 (twelve) hours. 12/03/13  Yes Carlena Bjornstad, MD  dronedarone (MULTAQ) 400 MG tablet Take 1 tablet (400 mg total) by mouth 2 (two) times daily with a meal. 03/07/14  Yes Carlena Bjornstad, MD  metoprolol tartrate (LOPRESSOR) 25 MG tablet Take 1 tablet (25 mg total) by mouth 2 (two) times daily. 08/20/13  Yes Carlena Bjornstad, MD  omeprazole (PRILOSEC) 20 MG capsule Take 20 mg by mouth. Take 1 tablet Monday, Wednesday, Friday, Saturday   Yes Historical Provider, MD    Trauma Course   Results for orders placed during the hospital encounter of 06/07/14 (from the past 48 hour(s))  CBC     Status: Abnormal   Collection Time    06/07/14  8:42 PM      Result Value Ref Range   WBC 10.2  4.0 - 10.5 K/uL   RBC 3.17 (*) 4.22 - 5.81 MIL/uL   Hemoglobin 10.3 (*) 13.0 - 17.0 g/dL   HCT 31.3 (*) 39.0 - 52.0 %   MCV 98.7  78.0 - 100.0 fL   MCH 32.5  26.0 - 34.0 pg   MCHC 32.9  30.0 - 36.0 g/dL   RDW 13.8  11.5 - 15.5 %   Platelets 209  150 - 400 K/uL  BASIC METABOLIC PANEL     Status: Abnormal   Collection Time    06/07/14  8:42 PM      Result Value Ref Range   Sodium 138  137 - 147 mEq/L   Potassium 4.4  3.7 - 5.3 mEq/L   Chloride 103  96 - 112 mEq/L   CO2 23  19 - 32 mEq/L   Glucose, Bld 120 (*) 70 - 99 mg/dL   BUN 21  6 - 23 mg/dL   Creatinine, Ser 1.55 (*) 0.50 - 1.35 mg/dL   Calcium 8.6  8.4 - 10.5 mg/dL   GFR calc non Af Amer 39 (*) >90 mL/min   GFR calc Af Amer 45 (*) >90 mL/min   Comment: (NOTE)     The eGFR has been calculated using the CKD EPI equation.     This calculation has not been validated in all clinical situations.     eGFR's persistently <90 mL/min signify possible Chronic Kidney     Disease.   Anion gap 12  5 - 15  PROTIME-INR     Status: Abnormal   Collection Time    06/07/14  8:42 PM      Result Value Ref Range   Prothrombin Time 18.2 (*) 11.6 - 15.2 seconds   INR 1.51 (*) 0.00 - 1.49  ETHANOL     Status: None   Collection Time    06/07/14  8:42 PM      Result Value Ref Range   Alcohol, Ethyl (B) <11  0 - 11 mg/dL   Comment:            LOWEST DETECTABLE LIMIT FOR     SERUM ALCOHOL IS 11 mg/dL     FOR MEDICAL PURPOSES ONLY   Ct Head Wo Contrast  06/07/2014   CLINICAL DATA:  FALL FACIAL LACERATION EPISTAXIS  EXAM: CT HEAD WITHOUT CONTRAST  CT MAXILLOFACIAL WITHOUT CONTRAST  TECHNIQUE: Multidetector CT imaging of the head and maxillofacial structures were performed using the standard protocol without intravenous contrast. Multiplanar CT image reconstructions of the maxillofacial structures were also generated.  COMPARISON:  None.  FINDINGS: CT HEAD FINDINGS  Opacification of left maxillary sinus with fracture of anterior and lateral walls. Near complete opacification of right maxillary sinus with fracture of anterior and lateral walls. Gas in the right masseter space probably related to maxillary fracture.  Moderate diffuse parenchymal atrophy. Negative for acute intracranial hemorrhage, mass lesion, acute infarction, midline shift, or mass-effect. Acute infarct may be inapparent on noncontrast CT. Ventricles and sulci symmetric. Bone windows demonstrate no focal lesion.  CT MAXILLOFACIAL FINDINGS  Minimally comminuted right nasal bone fracture. Minimally comminuted nasal septal fracture. Fracture of the anterior and lateral walls of bilateral maxillary  sinuses. Fracture of bilateral pterygoid plates. Orbital floors intact. Zygomatic arches intact. Mandible intact. Temporomandibular joints seated.  IMPRESSION: 1. Bilateral LeFort type 1 facial fractures. 2. Negative for bleed or other acute intracranial process.   Electronically Signed   By: Arne Cleveland M.D.   On: 06/07/2014 18:31   Dg Hand Complete Left  06/07/2014   CLINICAL DATA:  Fall with injury. Deep laceration tube little finger  EXAM: LEFT HAND - COMPLETE 3+ VIEW  COMPARISON:  None.  FINDINGS: No evidence for an acute fracture. No subluxation or dislocation. Loss of joint space is seen at the MCP joint of the index finger. There is advanced degenerative changes at the first carpometacarpal joint.  IMPRESSION: No acute bony abnormality.   Electronically Signed   By: Misty Stanley M.D.   On: 06/07/2014 18:04   Ct Maxillofacial Wo Cm  06/07/2014   CLINICAL DATA:  FALL FACIAL LACERATION EPISTAXIS  EXAM: CT HEAD WITHOUT CONTRAST  CT MAXILLOFACIAL WITHOUT CONTRAST  TECHNIQUE: Multidetector CT imaging of the head and maxillofacial structures were performed using the standard protocol without intravenous contrast. Multiplanar CT image reconstructions of the maxillofacial structures were also generated.  COMPARISON:  None.  FINDINGS: CT HEAD FINDINGS  Opacification of left maxillary sinus with fracture of anterior and lateral walls. Near complete opacification of right  maxillary sinus with fracture of anterior and lateral walls. Gas in the right masseter space probably related to maxillary fracture.  Moderate diffuse parenchymal atrophy. Negative for acute intracranial hemorrhage, mass lesion, acute infarction, midline shift, or mass-effect. Acute infarct may be inapparent on noncontrast CT. Ventricles and sulci symmetric. Bone windows demonstrate no focal lesion.  CT MAXILLOFACIAL FINDINGS  Minimally comminuted right nasal bone fracture. Minimally comminuted nasal septal fracture. Fracture of the anterior  and lateral walls of bilateral maxillary sinuses. Fracture of bilateral pterygoid plates. Orbital floors intact. Zygomatic arches intact. Mandible intact. Temporomandibular joints seated.  IMPRESSION: 1. Bilateral LeFort type 1 facial fractures. 2. Negative for bleed or other acute intracranial process.   Electronically Signed   By: Arne Cleveland M.D.   On: 06/07/2014 18:31    Review of Systems  Constitutional: Negative for weight loss.  HENT: Positive for nosebleeds. Negative for ear discharge, ear pain, hearing loss and tinnitus.   Eyes: Negative for blurred vision, double vision, photophobia and pain.  Respiratory: Negative for cough, sputum production and shortness of breath.   Cardiovascular: Positive for chest pain (left chest).  Gastrointestinal: Negative for nausea, vomiting and abdominal pain.  Genitourinary: Negative for dysuria, urgency, frequency and flank pain.  Musculoskeletal: Positive for falls. Negative for back pain, joint pain, myalgias and neck pain.  Neurological: Negative for dizziness, tingling, sensory change, focal weakness, loss of consciousness and headaches.  Endo/Heme/Allergies: Does not bruise/bleed easily.  Psychiatric/Behavioral: Negative for depression, memory loss and substance abuse. The patient is not nervous/anxious.     Blood pressure 153/76, pulse 64, temperature 98 F (36.7 C), temperature source Oral, resp. rate 13, SpO2 100.00%. Physical Exam  Constitutional: He is oriented to person, place, and time. He appears well-developed and well-nourished.  HENT:  Head: Normocephalic.  Multiple small facial lacerations/ abrasions Left side of face with some edema  Eyes: EOM are normal. Pupils are equal, round, and reactive to light.  Neck: Normal range of motion. Neck supple.  Cardiovascular: Normal rate and regular rhythm.   Respiratory: Effort normal and breath sounds normal.  Mild left chest wall tenderness - no palpable fractures, no crepitus; no  external signs of trauma  GI: Soft. Bowel sounds are normal.  Musculoskeletal: Normal range of motion.  Abrasions/ punctate lacerations left knee Left fifth finger under dressing   Neurological: He is alert and oriented to person, place, and time.  Skin: Skin is warm and dry.  Psychiatric: He has a normal mood and affect. His behavior is normal. Judgment and thought content normal.     Assessment/Plan 1.  Fall 2.  Bilateral Lefort I nasal and maxillary sinus fractures 3.  Left chest wall contusion 4.  Left fifth finger laceration - repaired by EDP 5.  Multiple facial lacerations - repaired by EDP 6.  Epistaxis secondary to anticoagulation - required nasal packing;  ENT to evaluate  Admit for observation Elevated HOB 45 degrees Bacitracin to lacerations Clear liquids PRN pain meds ENT - Dr. Simeon Craft to evaluate    Anicia Leuthold K. 06/07/2014, 10:13 PM   Procedures

## 2014-06-08 ENCOUNTER — Encounter (HOSPITAL_COMMUNITY): Payer: Self-pay | Admitting: *Deleted

## 2014-06-08 DIAGNOSIS — S02400A Malar fracture unspecified, initial encounter for closed fracture: Secondary | ICD-10-CM | POA: Diagnosis not present

## 2014-06-08 DIAGNOSIS — S0180XA Unspecified open wound of other part of head, initial encounter: Secondary | ICD-10-CM | POA: Diagnosis not present

## 2014-06-08 DIAGNOSIS — S02109A Fracture of base of skull, unspecified side, initial encounter for closed fracture: Secondary | ICD-10-CM | POA: Diagnosis not present

## 2014-06-08 DIAGNOSIS — IMO0002 Reserved for concepts with insufficient information to code with codable children: Secondary | ICD-10-CM

## 2014-06-08 DIAGNOSIS — S022XXA Fracture of nasal bones, initial encounter for closed fracture: Secondary | ICD-10-CM | POA: Diagnosis not present

## 2014-06-08 DIAGNOSIS — S02401A Maxillary fracture, unspecified, initial encounter for closed fracture: Secondary | ICD-10-CM | POA: Diagnosis not present

## 2014-06-08 LAB — APTT: APTT: 32 s (ref 24–37)

## 2014-06-08 MED ORDER — OXYCODONE HCL 5 MG PO TABS
5.0000 mg | ORAL_TABLET | ORAL | Status: DC | PRN
  Filled 2014-06-08: qty 1

## 2014-06-08 MED ORDER — ATORVASTATIN CALCIUM 10 MG PO TABS
10.0000 mg | ORAL_TABLET | Freq: Every day | ORAL | Status: DC
  Administered 2014-06-09 – 2014-06-10 (×2): 10 mg via ORAL
  Filled 2014-06-08 (×3): qty 1

## 2014-06-08 MED ORDER — PANTOPRAZOLE SODIUM 40 MG PO TBEC
40.0000 mg | DELAYED_RELEASE_TABLET | Freq: Every day | ORAL | Status: DC
Start: 2014-06-08 — End: 2014-06-10
  Administered 2014-06-10: 40 mg via ORAL
  Filled 2014-06-08 (×3): qty 1

## 2014-06-08 MED ORDER — HYDROMORPHONE HCL PF 1 MG/ML IJ SOLN
1.0000 mg | INTRAMUSCULAR | Status: DC | PRN
  Administered 2014-06-08: 1 mg via INTRAVENOUS
  Filled 2014-06-08: qty 1

## 2014-06-08 MED ORDER — ONDANSETRON HCL 4 MG PO TABS: 4.0000 mg | ORAL_TABLET | Freq: Four times a day (QID) | ORAL | Status: DC | PRN

## 2014-06-08 MED ORDER — ONDANSETRON HCL 4 MG/2ML IJ SOLN: 4.0000 mg | Freq: Four times a day (QID) | INTRAMUSCULAR | Status: DC | PRN

## 2014-06-08 MED ORDER — CIPROFLOXACIN HCL 0.3 % OP SOLN
1.0000 [drp] | OPHTHALMIC | Status: DC
  Administered 2014-06-08 – 2014-06-10 (×6): 1 [drp] via OPHTHALMIC
  Filled 2014-06-08: qty 2.5

## 2014-06-08 MED ORDER — DRONEDARONE HCL 400 MG PO TABS
400.0000 mg | ORAL_TABLET | Freq: Two times a day (BID) | ORAL | Status: DC
  Administered 2014-06-08 – 2014-06-10 (×5): 400 mg via ORAL
  Filled 2014-06-08 (×8): qty 1

## 2014-06-08 MED ORDER — METOPROLOL TARTRATE 25 MG PO TABS
25.0000 mg | ORAL_TABLET | Freq: Two times a day (BID) | ORAL | Status: DC
  Administered 2014-06-08 – 2014-06-10 (×5): 25 mg via ORAL
  Filled 2014-06-08 (×8): qty 1

## 2014-06-08 MED ORDER — BACITRACIN ZINC 500 UNIT/GM EX OINT
TOPICAL_OINTMENT | Freq: Two times a day (BID) | CUTANEOUS | Status: DC
  Administered 2014-06-08 – 2014-06-10 (×5): via TOPICAL
  Filled 2014-06-08: qty 15
  Filled 2014-06-08: qty 28.35
  Filled 2014-06-08: qty 15

## 2014-06-08 MED ORDER — SODIUM CHLORIDE 0.9 % IV SOLN
INTRAVENOUS | Status: DC
  Administered 2014-06-08 (×2): via INTRAVENOUS

## 2014-06-08 NOTE — Progress Notes (Addendum)
Subjective: Stable and alert. Oriented. Cooperative. Nasal packing came out last night. No more bleeding from nose. Received dilaudid last night and he then vomited some old blood. No evidence of active bleeding since then. States that his soft palate is a little incompetent and he's refluxing a little bit  SpO2 98% on room air. 98 1. BP 143/64. Heart rate 61.  Objective: Vital signs in last 24 hours: Temp:  [98 F (36.7 C)-98.8 F (37.1 C)] 98.1 F (36.7 C) (07/25 0454) Pulse Rate:  [57-72] 61 (07/25 0454) Resp:  [8-20] 18 (07/25 0454) BP: (128-155)/(60-101) 143/64 mmHg (07/25 0454) SpO2:  [95 %-100 %] 98 % (07/25 0454) Weight:  [198 lb 6.6 oz (90 kg)] 198 lb 6.6 oz (90 kg) (07/25 0001)    Intake/Output from previous day: 07/24 0701 - 07/25 0700 In: 393.3 [P.O.:120; I.V.:273.3] Out: 400 [Urine:200; Emesis/NG output:200] Intake/Output this shift:    General appearance: alert. Pleasant. Cooperative. Mental status normal. Head:  extraocular movements intact. No diplopia. Multiple facial lacerations without active bleeding. Slight swelling.. Oropharynx shows that the hard palate and soft palate looked normal. There is a small streak of old blood coming down the  left lateral fold. Neck:  supple, symmetrical, trachea midline, symmetric, neck shows no tenderness posteriorly. No pain with active or passive range of motion Resp: clear to auscultation bilaterally GI: soft, non-tender; bowel sounds normal; no masses,  no organomegaly  Lab Results:   Recent Labs  06/07/14 2042  WBC 10.2  HGB 10.3*  HCT 31.3*  PLT 209   BMET  Recent Labs  06/07/14 2042  NA 138  K 4.4  CL 103  CO2 23  GLUCOSE 120*  BUN 21  CREATININE 1.55*  CALCIUM 8.6   PT/INR  Recent Labs  06/07/14 2042  LABPROT 18.2*  INR 1.51*   ABG No results found for this basename: PHART, PCO2, PO2, HCO3,  in the last 72 hours  Studies/Results: Ct Head Wo Contrast  06/07/2014   CLINICAL DATA:   FALL FACIAL LACERATION EPISTAXIS  EXAM: CT HEAD WITHOUT CONTRAST  CT MAXILLOFACIAL WITHOUT CONTRAST  TECHNIQUE: Multidetector CT imaging of the head and maxillofacial structures were performed using the standard protocol without intravenous contrast. Multiplanar CT image reconstructions of the maxillofacial structures were also generated.  COMPARISON:  None.  FINDINGS: CT HEAD FINDINGS  Opacification of left maxillary sinus with fracture of anterior and lateral walls. Near complete opacification of right maxillary sinus with fracture of anterior and lateral walls. Gas in the right masseter space probably related to maxillary fracture.  Moderate diffuse parenchymal atrophy. Negative for acute intracranial hemorrhage, mass lesion, acute infarction, midline shift, or mass-effect. Acute infarct may be inapparent on noncontrast CT. Ventricles and sulci symmetric. Bone windows demonstrate no focal lesion.  CT MAXILLOFACIAL FINDINGS  Minimally comminuted right nasal bone fracture. Minimally comminuted nasal septal fracture. Fracture of the anterior and lateral walls of bilateral maxillary sinuses. Fracture of bilateral pterygoid plates. Orbital floors intact. Zygomatic arches intact. Mandible intact. Temporomandibular joints seated.  IMPRESSION: 1. Bilateral LeFort type 1 facial fractures. 2. Negative for bleed or other acute intracranial process.   Electronically Signed   By: Arne Cleveland M.D.   On: 06/07/2014 18:31   Dg Hand Complete Left  06/07/2014   CLINICAL DATA:  Fall with injury. Deep laceration tube little finger  EXAM: LEFT HAND - COMPLETE 3+ VIEW  COMPARISON:  None.  FINDINGS: No evidence for an acute fracture. No subluxation or dislocation. Loss of joint  space is seen at the MCP joint of the index finger. There is advanced degenerative changes at the first carpometacarpal joint.  IMPRESSION: No acute bony abnormality.   Electronically Signed   By: Misty Stanley M.D.   On: 06/07/2014 18:04   Ct  Maxillofacial Wo Cm  06/07/2014   CLINICAL DATA:  FALL FACIAL LACERATION EPISTAXIS  EXAM: CT HEAD WITHOUT CONTRAST  CT MAXILLOFACIAL WITHOUT CONTRAST  TECHNIQUE: Multidetector CT imaging of the head and maxillofacial structures were performed using the standard protocol without intravenous contrast. Multiplanar CT image reconstructions of the maxillofacial structures were also generated.  COMPARISON:  None.  FINDINGS: CT HEAD FINDINGS  Opacification of left maxillary sinus with fracture of anterior and lateral walls. Near complete opacification of right maxillary sinus with fracture of anterior and lateral walls. Gas in the right masseter space probably related to maxillary fracture.  Moderate diffuse parenchymal atrophy. Negative for acute intracranial hemorrhage, mass lesion, acute infarction, midline shift, or mass-effect. Acute infarct may be inapparent on noncontrast CT. Ventricles and sulci symmetric. Bone windows demonstrate no focal lesion.  CT MAXILLOFACIAL FINDINGS  Minimally comminuted right nasal bone fracture. Minimally comminuted nasal septal fracture. Fracture of the anterior and lateral walls of bilateral maxillary sinuses. Fracture of bilateral pterygoid plates. Orbital floors intact. Zygomatic arches intact. Mandible intact. Temporomandibular joints seated.  IMPRESSION: 1. Bilateral LeFort type 1 facial fractures. 2. Negative for bleed or other acute intracranial process.   Electronically Signed   By: Arne Cleveland M.D.   On: 06/07/2014 18:31    Anti-infectives: Anti-infectives   None      Assessment/Plan:  1. Fall  2. Bilateral nondisplaced Lefort I nasal and maxillary sinus fractures  3. Left chest wall contusion  4. Left fifth finger laceration - repaired by EDP  5. Multiple facial lacerations - repaired by EDP  6. Epistaxis secondary to anticoagulation - resolved. Appreciate ENT eval 7. Holding pradaxa 3-7 days. Appreciate hematology advice from Dr. Beryle Beams.  Ambulate  with assistance Advance diet Check CBC tomorrow ENT followup as outpatient   LOS: 1 day    Weltha Cathy M 06/08/2014

## 2014-06-08 NOTE — ED Provider Notes (Addendum)
I saw and evaluated the patient, reviewed the resident's note and I agree with the findings and plan. Patient is an 80 year old male with past medical history of coronary artery disease, TIA, atrial fibrillation and is currently taking Pradaxa.  He presents after a fall that occurred at home. He was carrying a bag of topsoil when he tripped and fell forward and struck his face on the patio. There is no loss of consciousness and he denies neck pain. He has extensive abrasions and swelling to his face and nose. He has had significant bleeding from his nose as well. He also reports a laceration to his left hand.  On exam, vitals are stable and the patient is afebrile. There are extensive abrasions to his forehead, left cheek, nose. There is a 1.5 cm laceration to the left forehead and a laceration to the upper lip. Pupils are equally round and reactive. Extraocular muscles are intact. There is no diplopia with upward gaze. There is swelling to the bridge of the nose along with active bleeding from the left nares. There is no septal hematoma. TMs are clear without hemotympanum. He has no cervical spine tenderness and has painless range of motion in all directions. Heart is regular rate and rhythm. Lungs are clear and equal. Abdomen is benign. He does have a 2.5 cm laceration to the left fifth finger with no apparent tendon involvement.  The patient's wounds were cleaned, sutured, and dressed. A Rhino Rocket nasal packing was placed in the left nares along with Neo-Synephrine. This achieved good hemostasis. He underwent CT scans of the head and maxillofacial bones. This revealed bilateral Eddie Dibbles I fractures. These findings were discussed with ENT who did not feel as though any emergent intervention was indicated. Due to the patient's anticoagulation with pradaxa and significant trauma to the head, trauma has been consulted for admission and observation. He was also given repeat doses of pain medication to treat his  discomfort.      Veryl Speak, MD 06/08/14 1549   Lacerations were repaired by Dr. Silvio Clayman under my supervision. See procedure note by Dr. Silvio Clayman.  Veryl Speak, MD 06/21/14 1310

## 2014-06-08 NOTE — Progress Notes (Signed)
0150 Patient stood up to void and got nauseated vomited large amt of blood. Patient states he feels better. Daughter told me he had blew the packing out of his left nare and show me the packing. No bleeding noted from left nare.Patient resting at present. Will continue monitor closely

## 2014-06-08 NOTE — Progress Notes (Signed)
Patient ID: [name] [name], male   DOB: 29-Oct-1925, 80 y.o.   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 Both have been stable over time. Main active medical  problem relates to his atrial fibrillation. Initial onset in 2011. He has now had 3 cardioversion procedures most recent 11/29/2013. He is back in sinus rhythm. He has been on chronic anticoagulation with Pradaxa 75 mg BID. Cardiac problems followed closely by Dr Daryel November.  He had a right rotator cuff repair 1/32/44 without complications.  He has mild chronic renal insufficiency with creatinine 1.5-1.8, chronic anemia with Hemoglobin 12.  He now presents after he stumbled and fell on his patio at home. He was carrying a bag of soil, dropped it,bent over to pick it up and lost his balance falling face forward on a cement and gravel surface. He has sustained fractures of right nasal bone,nasal septum, anterior and lateral walls of bilateral maxillary sinuses and bilateral pterygoid plates. (LeFort type 1 fractures). No intracranial hemorrhage.  Last dose of Pradaxa was Friday morning  ROS: subjective sensation that his jaw is out of alignment and floor of mouth is numb.  Difficulty swallowing.  Exam: Blood pressure 143/64, pulse 61, temperature 98.1 F (36.7 C), temperature source Oral, resp. rate 18, height 5\' 10"  (1.778 m), weight 198 lb 6.6 oz (90 kg), SpO2 98.00%. Alert & oriented Extensive soft tissue swelling left face down to chin;  Small laceration sutured over left eye PERRLA,  full EOMs, tongue midline, gag not tested  <ECG> Lab: Hemoglobin 10, baseline 12.  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 No obvious external  bleeding now off Pradaxa for over 24 hours. With his renal function, there may still be some residual drug on board for another 24 hours so if any major surgery needed, I would defer for additional 24 hours. Fractures are not displaced so I doubt he will need surgery.  I will get a baseline PTT: Pradaxa afffects PTT. Reasonable negative predictive value if normal that no significant amount of drug on board.  OK to hold Pradaxa for 3-7 days. He has been in sinus rhythm for about 8 months. I will get a baseline EKG  I can be reached at [phone] if any questions or concerns  Thanks  Status discussed with Dr Dalbert Batman. ENT to follow up.

## 2014-06-09 ENCOUNTER — Telehealth: Payer: Self-pay

## 2014-06-09 ENCOUNTER — Encounter (HOSPITAL_COMMUNITY): Payer: Self-pay

## 2014-06-09 LAB — CBC
HCT: 26.1 % — ABNORMAL LOW (ref 39.0–52.0)
Hemoglobin: 8.8 g/dL — ABNORMAL LOW (ref 13.0–17.0)
MCH: 33.2 pg (ref 26.0–34.0)
MCHC: 33.7 g/dL (ref 30.0–36.0)
MCV: 98.5 fL (ref 78.0–100.0)
Platelets: 166 10*3/uL (ref 150–400)
RBC: 2.65 MIL/uL — ABNORMAL LOW (ref 4.22–5.81)
RDW: 14.3 % (ref 11.5–15.5)
WBC: 9.1 10*3/uL (ref 4.0–10.5)

## 2014-06-09 MED ORDER — HYDROMORPHONE HCL 2 MG PO TABS
1.0000 mg | ORAL_TABLET | ORAL | Status: DC | PRN
  Administered 2014-06-09: 2 mg via ORAL
  Filled 2014-06-09: qty 1

## 2014-06-09 MED ORDER — ENOXAPARIN SODIUM 40 MG/0.4ML ~~LOC~~ SOLN
40.0000 mg | SUBCUTANEOUS | Status: DC
  Administered 2014-06-09: 40 mg via SUBCUTANEOUS
  Filled 2014-06-09 (×2): qty 0.4

## 2014-06-09 NOTE — Progress Notes (Signed)
Patient ID: COAL NEARHOOD, male   DOB: [birth year], 80 y.o.   MRN: [id number] Blood pressure 122/52, pulse 72, temperature 98.9 F (37.2 C), temperature source Oral, resp. rate 17, height 5\' 10"  (1.778 m), weight 198 lb 6.6 oz (90 kg), SpO2 97.00%. Now able to swallow without difficulty Hb down to 8.8 from baseline 12 PTT 32 seconds: no concern re residual Pradaxa but I would like to cover him with prophylactic lovenox while he is in hospital. EKG reviewed: sinus bradycardia; chronic left axis and RBBB, T inversions v1,v2 Exam: Post pharynx ecchymosis left soft palate; bilateral buccal ecchymoses Lungs clear Heart regular Soft tissue swelling left face, left hand subsiding Impression: Overall stable d 2 post traumatic facial bone fractures I will D/C his IV  Begin prophylactic lovenox

## 2014-06-09 NOTE — Progress Notes (Signed)
Subjective: Doing somewhat better. Ambulating some. Swallowing better with less dysphagia. Takes he has a little bit of malocclusion. No visual disturbance. No respiratory or GI problems.No recurrent bleeding.  Appreciate  hematology input by Dr. Beryle Beams. To start on prophylactic Lovenox today. Continue to hold Pradaxa  PTT 32 sec. Hgb. Drifted down to 8.8.Marland Kitchen  Objective: Vital signs in last 24 hours: Temp:  [98.1 F (36.7 C)-99.1 F (37.3 C)] 98.9 F (37.2 C) (07/26 1006) Pulse Rate:  [60-72] 72 (07/26 1006) Resp:  [17-18] 17 (07/26 1006) BP: (122-171)/(52-87) 122/52 mmHg (07/26 1006) SpO2:  [96 %-100 %] 97 % (07/26 1006) Last BM Date: 06/08/14  Intake/Output from previous day: 07/25 0701 - 07/26 0700 In: 1900.8 [P.O.:730; I.V.:1170.8] Out: 802 [Urine:802] Intake/Output this shift:    General appearance: alert. Pleasant. Cooperative. Mental status normal.  Head: extraocular movements intact. No diplopia. Multiple facial lacerations without active bleeding. Slight swelling.. Oropharynx shows that the hard palate and soft palate looked normal. There is a small streak of old blood coming down the left lateral fold with mild ecchymoses Neck: supple, symmetrical, trachea midline, symmetric, neck shows no tenderness posteriorly. No pain with active or passive range of motion  Resp: clear to auscultation bilaterally  GI: soft, non-tender; bowel sounds normal; no masses, no organomegaly   Lab Results:   Recent Labs  06/07/14 2042 06/09/14 0015  WBC 10.2 9.1  HGB 10.3* 8.8*  HCT 31.3* 26.1*  PLT 209 166   BMET  Recent Labs  06/07/14 2042  NA 138  K 4.4  CL 103  CO2 23  GLUCOSE 120*  BUN 21  CREATININE 1.55*  CALCIUM 8.6   PT/INR  Recent Labs  06/07/14 2042  LABPROT 18.2*  INR 1.51*   ABG No results found for this basename: PHART, PCO2, PO2, HCO3,  in the last 72 hours  Studies/Results: Ct Head Wo Contrast  06/07/2014   CLINICAL DATA:  FALL FACIAL  LACERATION EPISTAXIS  EXAM: CT HEAD WITHOUT CONTRAST  CT MAXILLOFACIAL WITHOUT CONTRAST  TECHNIQUE: Multidetector CT imaging of the head and maxillofacial structures were performed using the standard protocol without intravenous contrast. Multiplanar CT image reconstructions of the maxillofacial structures were also generated.  COMPARISON:  None.  FINDINGS: CT HEAD FINDINGS  Opacification of left maxillary sinus with fracture of anterior and lateral walls. Near complete opacification of right maxillary sinus with fracture of anterior and lateral walls. Gas in the right masseter space probably related to maxillary fracture.  Moderate diffuse parenchymal atrophy. Negative for acute intracranial hemorrhage, mass lesion, acute infarction, midline shift, or mass-effect. Acute infarct may be inapparent on noncontrast CT. Ventricles and sulci symmetric. Bone windows demonstrate no focal lesion.  CT MAXILLOFACIAL FINDINGS  Minimally comminuted right nasal bone fracture. Minimally comminuted nasal septal fracture. Fracture of the anterior and lateral walls of bilateral maxillary sinuses. Fracture of bilateral pterygoid plates. Orbital floors intact. Zygomatic arches intact. Mandible intact. Temporomandibular joints seated.  IMPRESSION: 1. Bilateral LeFort type 1 facial fractures. 2. Negative for bleed or other acute intracranial process.   Electronically Signed   By: Arne Cleveland [name].D.   On: 06/07/2014 18:31   Dg Hand Complete Left  06/07/2014   CLINICAL DATA:  Fall with injury. Deep laceration tube little finger  EXAM: LEFT HAND - COMPLETE 3+ VIEW  COMPARISON:  None.  FINDINGS: No evidence for an acute fracture. No subluxation or dislocation. Loss of joint space is seen at the MCP joint of the index finger. There is advanced  degenerative changes at the first carpometacarpal joint.  IMPRESSION: No acute bony abnormality.   Electronically Signed   By: Misty Stanley [name].D.   On: 06/07/2014 18:04   Ct Maxillofacial Wo  Cm  06/07/2014   CLINICAL DATA:  FALL FACIAL LACERATION EPISTAXIS  EXAM: CT HEAD WITHOUT CONTRAST  CT MAXILLOFACIAL WITHOUT CONTRAST  TECHNIQUE: Multidetector CT imaging of the head and maxillofacial structures were performed using the standard protocol without intravenous contrast. Multiplanar CT image reconstructions of the maxillofacial structures were also generated.  COMPARISON:  None.  FINDINGS: CT HEAD FINDINGS  Opacification of left maxillary sinus with fracture of anterior and lateral walls. Near complete opacification of right maxillary sinus with fracture of anterior and lateral walls. Gas in the right masseter space probably related to maxillary fracture.  Moderate diffuse parenchymal atrophy. Negative for acute intracranial hemorrhage, mass lesion, acute infarction, midline shift, or mass-effect. Acute infarct may be inapparent on noncontrast CT. Ventricles and sulci symmetric. Bone windows demonstrate no focal lesion.  CT MAXILLOFACIAL FINDINGS  Minimally comminuted right nasal bone fracture. Minimally comminuted nasal septal fracture. Fracture of the anterior and lateral walls of bilateral maxillary sinuses. Fracture of bilateral pterygoid plates. Orbital floors intact. Zygomatic arches intact. Mandible intact. Temporomandibular joints seated.  IMPRESSION: 1. Bilateral LeFort type 1 facial fractures. 2. Negative for bleed or other acute intracranial process.   Electronically Signed   By: Arne Cleveland [name].D.   On: 06/07/2014 18:31    Anti-infectives: Anti-infectives   None      Assessment/Plan:  1. Fall  2. Bilateral nondisplaced Lefort I nasal and maxillary sinus fractures. Minor, subjective malocclusion. 3. Left chest wall contusion  4. Left fifth finger laceration - repaired by EDP  5. Multiple facial lacerations - repaired by EDP  6. Epistaxis secondary to anticoagulation - resolved. Appreciate ENT eval  7. Holding pradaxa 3-7 days. Appreciate hematology advice from Dr.  Beryle Beams.  8. Now on a low-dose Lovenox. DVT prophylaxis.  Ambulate with assistance  Advance diet. Stay with very soft foods upon discharge. Check CBC tomorrow  ENT followup as outpatient     LOS: 2 days    [name] [name] 06/09/2014

## 2014-06-09 NOTE — Discharge Instructions (Addendum)
Information on my medicine - Pradaxa (dabigatran)   Why was Pradaxa prescribed for you? Pradaxa was prescribed for you to reduce the risk of forming blood clots that cause a stroke if you have a medical condition called atrial fibrillation (a type of irregular heartbeat).    What do you Need to know about PradAXa? Take your Pradaxa TWICE DAILY - one capsule in the morning and one tablet in the evening with or without food.  It would be best to take the doses about the same time each day.  The capsules should not be broken, chewed or opened - they must be swallowed whole.  Do not store Pradaxa in other medication containers - once the bottle is opened the Pradaxa should be used within FOUR months; throw away any capsules that havent been by that time.  Take Pradaxa exactly as prescribed by your doctor.  DO NOT stop taking Pradaxa without talking to the doctor who prescribed the medication.  Stopping without other stroke prevention medication to take the place of Pradaxa may increase your risk of developing a clot that causes a stroke.  Refill your prescription before you run out.  After discharge, you should have regular check-up appointments with your healthcare provider that is prescribing your Pradaxa.  In the future your dose may need to be changed if your kidney function or weight changes by a significant amount.  What do you do if you miss a dose? If you miss a dose, take it as soon as you remember on the same day.  If your next dose is less than 6 hours away, skip the missed dose.  Do not take two doses of PRADAXA at the same time.  Important Safety Information A possible side effect of Pradaxa is bleeding. You should call your healthcare provider right away if you experience any of the following:   Bleeding from an injury or your nose that does not stop.   Unusual colored urine (red or dark brown) or unusual colored stools (red or black).   Unusual bruising for unknown  reasons.   A serious fall or if you hit your head (even if there is no bleeding).  Some medicines may interact with Pradaxa and might increase your risk of bleeding or clotting while on Pradaxa. To help avoid this, consult your healthcare provider or pharmacist prior to using any new prescription or non-prescription medications, including herbals, vitamins, non-steroidal anti-inflammatory drugs (NSAIDs) and supplements.  This website has more information on Pradaxa (dabigatran): www.Rule[employer].cz.   PLEASE RESUME YOUR PRADAXA ON Friday  Facial fractures (per Dr. Simeon Craft) -this will resolve in 4-6 weeks once the Lefort fractures are healed and can be managed with a liquid or soft NO-CHEW diet for 6 weeks. -avoid blowing your nose. -you may use afrin for 3-5 days

## 2014-06-10 ENCOUNTER — Other Ambulatory Visit: Payer: Self-pay | Admitting: Oncology

## 2014-06-10 ENCOUNTER — Other Ambulatory Visit: Payer: Self-pay

## 2014-06-10 LAB — CBC
HCT: 26.4 % — ABNORMAL LOW (ref 39.0–52.0)
Hemoglobin: 8.7 g/dL — ABNORMAL LOW (ref 13.0–17.0)
MCH: 32.6 pg (ref 26.0–34.0)
MCHC: 33 g/dL (ref 30.0–36.0)
MCV: 98.9 fL (ref 78.0–100.0)
Platelets: 177 10*3/uL (ref 150–400)
RBC: 2.67 MIL/uL — AB (ref 4.22–5.81)
RDW: 14.2 % (ref 11.5–15.5)
WBC: 8.5 10*3/uL (ref 4.0–10.5)

## 2014-06-10 MED ORDER — FERROUS SULFATE 300 (60 FE) MG/5ML PO SYRP
300.0000 mg | ORAL_SOLUTION | Freq: Three times a day (TID) | ORAL | Status: DC
  Filled 2014-06-10 (×4): qty 5

## 2014-06-10 MED ORDER — DABIGATRAN ETEXILATE MESYLATE 75 MG PO CAPS: 75.0000 mg | ORAL_CAPSULE | Freq: Two times a day (BID) | ORAL | Status: DC

## 2014-06-10 MED ORDER — FERROUS SULFATE 300 (60 FE) MG/5ML PO SYRP: 300.0000 mg | ORAL_SOLUTION | Freq: Three times a day (TID) | ORAL | Status: DC

## 2014-06-10 NOTE — Discharge Summary (Signed)
Bevely Hackbart, MD, MPH, FACS Trauma: [phone] General Surgery: [phone]  

## 2014-06-10 NOTE — Progress Notes (Signed)
Patient ID: [name] [name], male   DOB: 10-May-1925, 80 y.o.   MRN: [id number] Blood pressure 159/56, pulse 59, temperature 97.8 F (36.6 C), temperature source Oral, resp. rate 16, height 5\' 10"  (1.778 m), weight 198 lb 6.6 oz (90 kg), SpO2 96.00%. Exam stable Decreasing soft tissue swelling Hemoglobin has plateaued at 8.7 Likely home today with ENT follow up Hold Pradaxa until Friday  Start iron supplement

## 2014-06-10 NOTE — Telephone Encounter (Addendum)
**Note De-Identified  Obfuscation** According to the pts chart on admission to hospital on 7/24 the pt is taking Pradaxa 75 mg bid. Thanks.

## 2014-06-10 NOTE — Discharge Summary (Signed)
Physician Discharge Summary  ADRIANA STINEBAUGH ZOX:[id number] DOB: 1925-03-06 DOA: 06/07/2014  PCP: [name], MD  Consultation: ENT---Dr. Emeline Darling, pt wished to follow up with Dr. Lazarus Salines who is a personal friend, however, didn't want to wait until next Wednesday to be seen and therefore a follow up was made with Dr. Pollyann Kennedy for this Friday.   Admit date: 06/07/2014 Discharge date: 06/10/2014  Recommendations for Outpatient Follow-up:   Follow-up Information   Follow up with Serena Colonel, MD On 06/14/2014. (arrive by 1PM for a 1:30PM appt)    Specialty:  Otolaryngology   Contact information:   [address] [place] [address] [phone]       Follow up with [facility]. (As needed)    Contact information:   [address] [place] [address] [phone]       Call [name], MD.   Specialty:  Oncology   Contact information:   [address] [place] [address] [phone]      Discharge Diagnoses:  1. Fall 2. Bilateral nondisplaced lefort I nasal and maxillary sinus fracture 3. Left chest wall contusion 4. Left fifth digit laceration 5. Facial lacerations    Surgical Procedure: multiple laceration repair by EDP  Discharge Condition: stable Disposition: stable  Diet recommendation: regular  Filed Weights   06/08/14 0001  Weight: 198 lb 6.6 oz (90 kg)     Filed Vitals:   06/10/14 0950  BP: 132/61  Pulse: 66  Temp: 98.7 F (37.1 C)  Resp: 18     Hospital Course:  Yetta Glassman presented to [facility] following a fall.  He complained of facial pain and epistaxis.  The patient essentially dictated his own work up as he is a retired Development worker, community.  He refused recommendations by trauma and EDP.  He was admitted for observation.  Epistaxis was treated with packing, however, the patient removed this himself.  He was also found to have a lefort 1 fracture which was nondisplaced.  Dr. Emeline Darling was consulted for  this and decided on non operative management.  The patient wished to follow up with Dr. Lazarus Salines this week, however, he is unavailable but agreed to follow up with Dr. Pollyann Kennedy, will need sutures removed at this time(scheduled Friday)  Otherwise can follow up in our clinic to have sutures removed.  Dr. Cyndie Chime followed the patient, recommended resuming his pradaxa on Friday.  He verbalizes understanding of this.  He had mild abl anemia, remained stable, started on iron supplement per hematology.  On HD#3 the patient was felt stable for discharge.  He declined any pain medication.  He will follow up with Dr. Cyndie Chime.  A follow up has been made for ENT as he requested, did not wish to see Dr. Emeline Darling.  He is to have sutures removed this Friday, if unable, follow up in our clinic on Wednesday for removal.       Physical exam: General appearance: alert and oriented. Calm and cooperative No acute distress. VSS. Afebrile.  Face: some swelling and ecchymosis Resp: clear to auscultation bilaterally  Cardio: S1S1 RRR without murmurs or gallops. No edema. GI: soft round and nontender. +BS x4 quadrants. No organomegaly, hernias or masses.  Pulses: +2 bilateral distal pulses without cyanosis    Discharge Instructions     Medication List         aspirin 81 MG tablet  Take 81 mg by mouth 3 (three) times a week. Monday Wednesday and Friday.  atorvastatin 10 MG tablet  Commonly known as:  LIPITOR  Take 1 tablet (10 mg total) by mouth daily.     dabigatran 75 MG Caps capsule  Commonly known as:  PRADAXA  Take 1 capsule (75 mg total) by mouth every 12 (twelve) hours.     dronedarone 400 MG tablet  Commonly known as:  MULTAQ  Take 1 tablet (400 mg total) by mouth 2 (two) times daily with a meal.     ferrous sulfate 300 (60 FE) MG/5ML syrup  Take 5 mLs (300 mg total) by mouth 3 (three) times daily with meals.     metoprolol tartrate 25 MG tablet  Commonly known as:  LOPRESSOR  Take 1 tablet  (25 mg total) by mouth 2 (two) times daily.     omeprazole 20 MG capsule  Commonly known as:  PRILOSEC  Take 20 mg by mouth. Take 1 tablet Monday, Wednesday, Friday, Saturday           Follow-up Information   Follow up with Serena Colonel, MD On 06/14/2014. (arrive by 1PM for a 1:30PM appt)    Specialty:  Otolaryngology   Contact information:   [address] [place] [address] [phone]       Follow up with [facility] [place]. (As needed)    Contact information:   [address] [place] [address] [phone]       Call [name], MD.   Specialty:  Oncology   Contact information:   [address] [place] [address] [phone]        The results of significant diagnostics from this hospitalization (including imaging, microbiology, ancillary and laboratory) are listed below for reference.    Significant Diagnostic Studies: Ct Head Wo Contrast  06/07/2014   CLINICAL DATA:  FALL FACIAL LACERATION EPISTAXIS  EXAM: CT HEAD WITHOUT CONTRAST  CT MAXILLOFACIAL WITHOUT CONTRAST  TECHNIQUE: Multidetector CT imaging of the head and maxillofacial structures were performed using the standard protocol without intravenous contrast. Multiplanar CT image reconstructions of the maxillofacial structures were also generated.  COMPARISON:  None.  FINDINGS: CT HEAD FINDINGS  Opacification of left maxillary sinus with fracture of anterior and lateral walls. Near complete opacification of right maxillary sinus with fracture of anterior and lateral walls. Gas in the right masseter space probably related to maxillary fracture.  Moderate diffuse parenchymal atrophy. Negative for acute intracranial hemorrhage, mass lesion, acute infarction, midline shift, or mass-effect. Acute infarct may be inapparent on noncontrast CT. Ventricles and sulci symmetric. Bone windows demonstrate no focal lesion.  CT MAXILLOFACIAL FINDINGS  Minimally comminuted right nasal  bone fracture. Minimally comminuted nasal septal fracture. Fracture of the anterior and lateral walls of bilateral maxillary sinuses. Fracture of bilateral pterygoid plates. Orbital floors intact. Zygomatic arches intact. Mandible intact. Temporomandibular joints seated.  IMPRESSION: 1. Bilateral LeFort type 1 facial fractures. 2. Negative for bleed or other acute intracranial process.   Electronically Signed   By: Oley Balm M.D.   On: 06/07/2014 18:31   Dg Hand Complete Left  06/07/2014   CLINICAL DATA:  Fall with injury. Deep laceration tube little finger  EXAM: LEFT HAND - COMPLETE 3+ VIEW  COMPARISON:  None.  FINDINGS: No evidence for an acute fracture. No subluxation or dislocation. Loss of joint space is seen at the MCP joint of the index finger. There is advanced degenerative changes at the first carpometacarpal joint.  IMPRESSION: No acute bony abnormality.   Electronically  Signed   By: Kennith Center M.D.   On: 06/07/2014 18:04   Ct Maxillofacial Wo Cm  06/07/2014   CLINICAL DATA:  FALL FACIAL LACERATION EPISTAXIS  EXAM: CT HEAD WITHOUT CONTRAST  CT MAXILLOFACIAL WITHOUT CONTRAST  TECHNIQUE: Multidetector CT imaging of the head and maxillofacial structures were performed using the standard protocol without intravenous contrast. Multiplanar CT image reconstructions of the maxillofacial structures were also generated.  COMPARISON:  None.  FINDINGS: CT HEAD FINDINGS  Opacification of left maxillary sinus with fracture of anterior and lateral walls. Near complete opacification of right maxillary sinus with fracture of anterior and lateral walls. Gas in the right masseter space probably related to maxillary fracture.  Moderate diffuse parenchymal atrophy. Negative for acute intracranial hemorrhage, mass lesion, acute infarction, midline shift, or mass-effect. Acute infarct may be inapparent on noncontrast CT. Ventricles and sulci symmetric. Bone windows demonstrate no focal lesion.  CT MAXILLOFACIAL  FINDINGS  Minimally comminuted right nasal bone fracture. Minimally comminuted nasal septal fracture. Fracture of the anterior and lateral walls of bilateral maxillary sinuses. Fracture of bilateral pterygoid plates. Orbital floors intact. Zygomatic arches intact. Mandible intact. Temporomandibular joints seated.  IMPRESSION: 1. Bilateral LeFort type 1 facial fractures. 2. Negative for bleed or other acute intracranial process.   Electronically Signed   By: Oley Balm M.D.   On: 06/07/2014 18:31    Microbiology: No results found for this or any previous visit (from the past 240 hour(s)).   Labs: Basic Metabolic Panel:  Recent Labs Lab 06/07/14 2042  NA 138  K 4.4  CL 103  CO2 23  GLUCOSE 120*  BUN 21  CREATININE 1.55*  CALCIUM 8.6   Liver Function Tests: No results found for this basename: AST, ALT, ALKPHOS, BILITOT, PROT, ALBUMIN,  in the last 168 hours No results found for this basename: LIPASE, AMYLASE,  in the last 168 hours No results found for this basename: AMMONIA,  in the last 168 hours CBC:  Recent Labs Lab 06/07/14 2042 06/09/14 0015 06/10/14 0054  WBC 10.2 9.1 8.5  HGB 10.3* 8.8* 8.7*  HCT 31.3* 26.1* 26.4*  MCV 98.7 98.5 98.9  PLT 209 166 177   Cardiac Enzymes: No results found for this basename: CKTOTAL, CKMB, CKMBINDEX, TROPONINI,  in the last 168 hours BNP: BNP (last 3 results) No results found for this basename: PROBNP,  in the last 8760 hours CBG: No results found for this basename: GLUCAP,  in the last 168 hours  Active Problems:   Facial fracture due to fall   Time coordinating discharge: <30 mins  Signed:  Lewanna Petrak, ANP-BC

## 2014-06-10 NOTE — Progress Notes (Addendum)
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 He stated that he feels like he has improved over the weekend to the point that he does not need suction at home. We discussed the need to protect his airway and not letting secretions become overwhelming before calling the ENT office to have this arranged for him. Pt also denies need for [facility] to visit. Will have 24hr assistance from family. No arrangements made at this time.  Medicare IM (Important Message) not given as pt still listed as observation.   Sandi Mariscal, RN BSN MHA CCM  Case Manager, Trauma Service/Unit 65M [phone]

## 2014-06-17 ENCOUNTER — Other Ambulatory Visit: Payer: Medicare PPO

## 2014-06-17 NOTE — H&P (Signed)
Assessment  Sensorineural hearing loss, bilateral (389.18) (H90.3). LeFort I fracture of maxilla, closed, initial encounter (802.4) (S02.411A). Facial laceration, subsequent encounter (V58.89,873.40) (S01.81XD). Anticoagulated (V58.61) (Z79.01). Atrial fibrillation (427.31) (I48.91). Discussed  Severe sensorineural hearing loss. We will order his most recent audiogram from the outside to compare to see if there has been any change. No evidence of middle ear disease. He may have suffered an inner ear concussion.   Lacerations, healing nicely, sutures removed.   Bilateral LeFort I fracture with mobile upper incision, and open bite deformity. Recommend open reduction internal fixation next week at the hospital. He will need cardiac clearance first. He is currently off his anticoagulation, continue to stay off that. He is currently in sinus rhythm. Atrial fibrillation seems to be stable at this moment. Reason For Visit  [facility] IP/Facial Fx 06-07-14. HPI  He fell on his face one week ago. CT revealed bilateral LeFort I fracture. His teeth did not fit normally together and he used to have a slight overbite but now has an open bite. He has chronic hearing loss but he feels that his hearing got worse a couple of days ago. He is on chronic anticoagulation for atrial fibrillation but has stopped taking his medication last week when all of his happened because of bleeding. Allergies  Chlorhexidine CONC guaifenesin oxycodone OxyCONTIN TB12. Current Meds  Ferrous Sulfate 300 MG TABS;; RPT Aspirin EC 81 MG Oral Tablet Delayed Release;; RPT Metoprolol Tartrate TABS;; RPT Omeprazole CPDR;; RPT Multaq 400 MG Oral Tablet;; RPT. Active Problems  Arthritis   (716.90) (M19.90) Hearing loss   (389.9) (H91.90) History of blood clotting disorder   (V12.3) (Z86.2) Prostate cancer   (185) (C61) Reflux   (530.81) (K21.9) Skin cancer   (173.90) (C44.90). Facial laceration (873.40) (S01.81XA). PSH  Cataract  Surgery Hernia Repair Oral Surgery Tooth Extraction Shoulder Surgery. Family Hx  Family history of cerebrovascular accident: Father (V17.1) (Z82.3) Family history of rheumatoid arthritis: Mother (V17.7) (Z82.61). Personal Hx  Never smoker Never used tobacco (V49.89) (Z78.9) No caffeine use Social alcohol use (F10.99). ROS  Systemic: Not feeling tired (fatigue).  No fever, no night sweats, and no recent weight loss. Head: No headache. Eyes: No eye symptoms. Otolaryngeal: Hearing loss.  No earache.  Tinnitus  and purulent nasal discharge.  No nasal passage blockage (stuffiness).  Snoring  and sneezing.  No hoarseness  and no sore throat. Cardiovascular: No chest pain or discomfort  and no palpitations. Pulmonary: No dyspnea, no cough, and no wheezing. Gastrointestinal: No dysphagia  and no heartburn.  No nausea, no abdominal pain, and no melena.  No diarrhea. Genitourinary: No dysuria. Endocrine: No muscle weakness. Musculoskeletal: No calf muscle cramps.  Arthralgias.  No soft tissue swelling. Neurological: No dizziness, no fainting, no tingling, and no numbness. Psychological: No anxiety  and no depression. Skin: No rash. 12 system ROS was obtained and reviewed on the Health Maintenance form dated today.  Positive responses are shown above.  If the symptom is not checked, the patient has denied it. Vital Signs   Recorded by Hamilton,Amy on 14 Jun 2014 01:37 PM BP:125/61,  HR: 57 b/min,  Height: 5 ft 7.5 in, Weight: 189 lb , BMI: 29.2 kg/m2,  BMI Calculated: 29.16 ,  BSA Calculated: 1.98. Physical Exam  APPEARANCE: Well developed, healthy appearing elderly gentleman, in no acute distress.  Normal affect, in a pleasant mood.  Oriented to time, place and person. COMMUNICATION: Normal voice   HEAD & FACE: Left forehead laceration, nicely healed. Sutures  removed. The upper lip laceration healing in well, sutures removed. Upper lip vermilion laceration, healing nicely, sutures removed.   No facial lesion, scars, or mass. EYES: EOMI with normal primary gaze alignment. Visual acuity grossly intact.  PERRLA EXTERNAL EAR & NOSE: No scars, lesions or masses  EAC & TYMPANIC MEMBRANE:  EAC shows no obstructing lesions or debris and tympanic membranes are normal bilaterally with good movement to insufflation. GROSS HEARING: Diminished TMJ:  Nontender  INTRANASAL EXAM: No polyps or purulence.  NASOPHARYNX: Normal, without lesions. LIPS, TEETH & GUMS: Anterior open bite deformity with malocclusion. Manual manipulation of the maxilla reveals mobility of the entire upper arch. ORAL CAVITY/OROPHARYNX:  Oral mucosa moist without lesion or asymmetry of the palate, tongue, tonsil or posterior pharynx. NECK:  Supple without adenopathy or mass. THYROID:  Normal with no masses palpable.  NEUROLOGIC:  No gross CN deficits. No nystagmus noted.   LYMPHATIC:  No enlarged nodes palpable. Results  Severe downsloping hearing loss with good symmetry. Amended : Izora Gala  M.D.; 06/14/2014 6:49 PM EST. Signature  Electronically signed by : Izora Gala  M.D.; 06/14/2014 6:38 PM EST. Electronically signed by : Izora Gala  M.D.; 06/14/2014 6:39 PM EST. Electronically signed by : Izora Gala  M.D.; 06/14/2014 6:49 PM EST.

## 2014-06-19 ENCOUNTER — Ambulatory Visit (INDEPENDENT_AMBULATORY_CARE_PROVIDER_SITE_OTHER): Payer: Medicare PPO | Admitting: Cardiology

## 2014-06-19 ENCOUNTER — Encounter (HOSPITAL_COMMUNITY): Payer: Self-pay | Admitting: Pharmacy Technician

## 2014-06-19 ENCOUNTER — Encounter (HOSPITAL_COMMUNITY): Payer: Self-pay

## 2014-06-19 ENCOUNTER — Encounter (HOSPITAL_COMMUNITY)
Admission: RE | Admit: 2014-06-19 | Discharge: 2014-06-19 | Disposition: A | Discharge status: Home / Self Care | Payer: Medicare PPO | Source: Ambulatory Visit | Attending: Otolaryngology | Admitting: Otolaryngology

## 2014-06-19 ENCOUNTER — Encounter: Payer: Self-pay | Admitting: Cardiology

## 2014-06-19 ENCOUNTER — Other Ambulatory Visit: Payer: Self-pay | Admitting: Orthopedic Surgery

## 2014-06-19 VITALS — BP 142/70 | HR 40 | Ht 67.0 in | Wt 194.0 lb

## 2014-06-19 DIAGNOSIS — Z8673 Personal history of transient ischemic attack (TIA), and cerebral infarction without residual deficits: Secondary | ICD-10-CM | POA: Diagnosis not present

## 2014-06-19 DIAGNOSIS — N189 Chronic kidney disease, unspecified: Secondary | ICD-10-CM | POA: Diagnosis not present

## 2014-06-19 DIAGNOSIS — M5030 Other cervical disc degeneration, unspecified cervical region: Secondary | ICD-10-CM | POA: Diagnosis not present

## 2014-06-19 DIAGNOSIS — Z01812 Encounter for preprocedural laboratory examination: Secondary | ICD-10-CM | POA: Diagnosis not present

## 2014-06-19 DIAGNOSIS — M129 Arthropathy, unspecified: Secondary | ICD-10-CM | POA: Diagnosis not present

## 2014-06-19 DIAGNOSIS — I359 Nonrheumatic aortic valve disorder, unspecified: Secondary | ICD-10-CM | POA: Diagnosis not present

## 2014-06-19 DIAGNOSIS — W010XXA Fall on same level from slipping, tripping and stumbling without subsequent striking against object, initial encounter: Secondary | ICD-10-CM | POA: Diagnosis not present

## 2014-06-19 DIAGNOSIS — Z7901 Long term (current) use of anticoagulants: Secondary | ICD-10-CM | POA: Diagnosis not present

## 2014-06-19 DIAGNOSIS — S02401A Maxillary fracture, unspecified, initial encounter for closed fracture: Secondary | ICD-10-CM | POA: Diagnosis not present

## 2014-06-19 DIAGNOSIS — Z8546 Personal history of malignant neoplasm of prostate: Secondary | ICD-10-CM | POA: Diagnosis not present

## 2014-06-19 DIAGNOSIS — H9190 Unspecified hearing loss, unspecified ear: Secondary | ICD-10-CM | POA: Diagnosis not present

## 2014-06-19 DIAGNOSIS — Z9849 Cataract extraction status, unspecified eye: Secondary | ICD-10-CM | POA: Diagnosis not present

## 2014-06-19 DIAGNOSIS — D649 Anemia, unspecified: Secondary | ICD-10-CM | POA: Diagnosis not present

## 2014-06-19 DIAGNOSIS — Z8261 Family history of arthritis: Secondary | ICD-10-CM | POA: Diagnosis not present

## 2014-06-19 DIAGNOSIS — K219 Gastro-esophageal reflux disease without esophagitis: Secondary | ICD-10-CM | POA: Diagnosis not present

## 2014-06-19 DIAGNOSIS — S02400A Malar fracture unspecified, initial encounter for closed fracture: Secondary | ICD-10-CM | POA: Diagnosis not present

## 2014-06-19 DIAGNOSIS — Z79899 Other long term (current) drug therapy: Secondary | ICD-10-CM | POA: Diagnosis not present

## 2014-06-19 DIAGNOSIS — I498 Other specified cardiac arrhythmias: Secondary | ICD-10-CM

## 2014-06-19 DIAGNOSIS — R001 Bradycardia, unspecified: Secondary | ICD-10-CM

## 2014-06-19 DIAGNOSIS — I350 Nonrheumatic aortic (valve) stenosis: Secondary | ICD-10-CM

## 2014-06-19 DIAGNOSIS — I129 Hypertensive chronic kidney disease with stage 1 through stage 4 chronic kidney disease, or unspecified chronic kidney disease: Secondary | ICD-10-CM | POA: Diagnosis not present

## 2014-06-19 DIAGNOSIS — I4891 Unspecified atrial fibrillation: Secondary | ICD-10-CM | POA: Diagnosis not present

## 2014-06-19 DIAGNOSIS — I480 Paroxysmal atrial fibrillation: Secondary | ICD-10-CM

## 2014-06-19 DIAGNOSIS — Z7982 Long term (current) use of aspirin: Secondary | ICD-10-CM | POA: Diagnosis not present

## 2014-06-19 DIAGNOSIS — Z87891 Personal history of nicotine dependence: Secondary | ICD-10-CM | POA: Diagnosis not present

## 2014-06-19 DIAGNOSIS — Z791 Long term (current) use of non-steroidal anti-inflammatories (NSAID): Secondary | ICD-10-CM | POA: Diagnosis not present

## 2014-06-19 DIAGNOSIS — Z85828 Personal history of other malignant neoplasm of skin: Secondary | ICD-10-CM | POA: Diagnosis not present

## 2014-06-19 DIAGNOSIS — Z823 Family history of stroke: Secondary | ICD-10-CM | POA: Diagnosis not present

## 2014-06-19 DIAGNOSIS — I779 Disorder of arteries and arterioles, unspecified: Secondary | ICD-10-CM | POA: Diagnosis not present

## 2014-06-19 DIAGNOSIS — I2510 Atherosclerotic heart disease of native coronary artery without angina pectoris: Secondary | ICD-10-CM | POA: Diagnosis not present

## 2014-06-19 DIAGNOSIS — D472 Monoclonal gammopathy: Secondary | ICD-10-CM | POA: Diagnosis not present

## 2014-06-19 DIAGNOSIS — E785 Hyperlipidemia, unspecified: Secondary | ICD-10-CM | POA: Diagnosis not present

## 2014-06-19 DIAGNOSIS — Z9889 Other specified postprocedural states: Secondary | ICD-10-CM | POA: Diagnosis not present

## 2014-06-19 DIAGNOSIS — M26220 Open anterior occlusal relationship: Secondary | ICD-10-CM | POA: Diagnosis not present

## 2014-06-19 DIAGNOSIS — S62309A Unspecified fracture of unspecified metacarpal bone, initial encounter for closed fracture: Secondary | ICD-10-CM | POA: Diagnosis not present

## 2014-06-19 DIAGNOSIS — Z01810 Encounter for preprocedural cardiovascular examination: Secondary | ICD-10-CM

## 2014-06-19 HISTORY — DX: Anemia, unspecified: D64.9

## 2014-06-19 LAB — BASIC METABOLIC PANEL
Anion gap: 9 (ref 5–15)
BUN: 22 mg/dL (ref 6–23)
CHLORIDE: 104 meq/L (ref 96–112)
CO2: 24 mEq/L (ref 19–32)
CREATININE: 1.9 mg/dL — AB (ref 0.50–1.35)
Calcium: 8.8 mg/dL (ref 8.4–10.5)
GFR calc non Af Amer: 30 mL/min — ABNORMAL LOW (ref >90)
GFR, EST AFRICAN AMERICAN: 35 mL/min — AB (ref >90)
GLUCOSE: 98 mg/dL (ref 70–99)
POTASSIUM: 4.7 meq/L (ref 3.7–5.3)
Sodium: 137 mEq/L (ref 137–147)

## 2014-06-19 LAB — CBC
HEMATOCRIT: 30.8 % — AB (ref 39.0–52.0)
HEMOGLOBIN: 10.5 g/dL — AB (ref 13.0–17.0)
MCH: 34.2 pg — ABNORMAL HIGH (ref 26.0–34.0)
MCHC: 34.1 g/dL (ref 30.0–36.0)
MCV: 100.3 fL — AB (ref 78.0–100.0)
Platelets: 284 10*3/uL (ref 150–400)
RBC: 3.07 MIL/uL — AB (ref 4.22–5.81)
RDW: 13.9 % (ref 11.5–15.5)
WBC: 6.8 10*3/uL (ref 4.0–10.5)

## 2014-06-19 MED ORDER — SODIUM CHLORIDE 0.45 % IV SOLN: INTRAVENOUS | Status: DC

## 2014-06-19 MED ORDER — VANCOMYCIN HCL IN DEXTROSE 1-5 GM/200ML-% IV SOLN: 1000.0000 mg | INTRAVENOUS | Status: AC

## 2014-06-19 MED ORDER — POVIDONE-IODINE 7.5 % EX SOLN
Freq: Once | CUTANEOUS | Status: DC
  Filled 2014-06-19: qty 118

## 2014-06-19 MED ORDER — CLINDAMYCIN PHOSPHATE 600 MG/50ML IV SOLN
600.0000 mg | INTRAVENOUS | Status: AC
  Administered 2014-06-22: 600 mg via INTRAVENOUS
  Filled 2014-06-19: qty 50

## 2014-06-19 MED ORDER — OXYMETAZOLINE HCL 0.05 % NA SOLN
2.0000 | NASAL | Status: DC
  Administered 2014-06-22: 2 via NASAL
  Filled 2014-06-19: qty 15

## 2014-06-19 NOTE — Assessment & Plan Note (Addendum)
The patient's cardiac status is stable. There is no evidence that his recent fall was related to a cardiac issue. Today in the office he has sinus bradycardia that is more marked than usual. I have instructed him to not take his beta blocker tomorrow morning before surgery. I feel that bradycardia is not the basis of his recent fall. He is cleared from the cardiac viewpoint to have his surgery tomorrow.

## 2014-06-19 NOTE — Pre-Procedure Instructions (Signed)
CORDARIOUS ZEEK  06/19/2014   Your procedure is scheduled on: Thursday, June 20, 2014 at 9:00 AM  Report to [facility] Admitting at 7:00 AM.  Call this number if you have problems the morning of surgery: [phone]   Remember:   Do not eat food or drink liquids after midnight tonight   Take these medicines the morning of surgery with A SIP OF WATER: dronedarone (MULTAQ), metoprolol tartrate (LOPRESSOR), omeprazole (PRILOSEC) Stop taking Aspirin, vitamins, and herbal medications. Do not take any NSAIDs ie: Ibuprofen, Advil, Naproxen or any medication containing Aspirin.  Do not wear jewelry, make-up or nail polish.  Do not wear lotions, powders, or perfumes. You may wear deodorant.  Do not shave 48 hours prior to surgery. Men may shave face and neck.  Do not bring valuables to the hospital.  [facility] is not responsible for any belongings or valuables.               Contacts, dentures or bridgework may not be worn into surgery.  Leave suitcase in the car. After surgery it may be brought to your room.  For patients admitted to the hospital, discharge time is determined by your treatment team.               Patients discharged the day of surgery will not be allowed to drive home.  Name and phone number of your driver:   Special Instructions:  Special Instructions:Special Instructions: [facility] - Preparing for Surgery  Before surgery, you can play an important role.  Because skin is not sterile, your skin needs to be as free of germs as possible.  You can reduce the number of germs on you skin by washing with CHG (chlorahexidine gluconate) soap before surgery.  CHG is an antiseptic cleaner which kills germs and bonds with the skin to continue killing germs even after washing.  Please DO NOT use if you have an allergy to CHG or antibacterial soaps.  If your skin becomes reddened/irritated stop using the CHG and inform your nurse when you arrive at Short Stay.  Do not  shave (including legs and underarms) for at least 48 hours prior to the first CHG shower.  You may shave your face.  Please follow these instructions carefully:   1.  Shower with CHG Soap the night before surgery and the morning of Surgery.  2.  If you choose to wash your hair, wash your hair first as usual with your normal shampoo.  3.  After you shampoo, rinse your hair and body thoroughly to remove the Shampoo.  4.  Use CHG as you would any other liquid soap.  You can apply chg directly  to the skin and wash gently with scrungie or a clean washcloth.  5.  Apply the CHG Soap to your body ONLY FROM THE NECK DOWN.  Do not use on open wounds or open sores.  Avoid contact with your eyes, ears, mouth and genitals (private parts).  Wash genitals (private parts) with your normal soap.  6.  Wash thoroughly, paying special attention to the area where your surgery will be performed.  7.  Thoroughly rinse your body with warm water from the neck down.  8.  DO NOT shower/wash with your normal soap after using and rinsing off the CHG Soap.  9.  Pat yourself dry with a clean towel.            10.  Wear clean pajamas.  11.  Place clean sheets on your bed the night of your first shower and do not sleep with pets.  Day of Surgery  Do not apply any lotions the morning of surgery.  Please wear clean clothes to the hospital/surgery center.   Please read over the following fact sheets that you were given: Pain Booklet and Surgical Site Infection Prevention

## 2014-06-19 NOTE — Patient Instructions (Signed)
Your physician recommends that you continue on your current medications as directed. Please refer to the Current Medication list given to you today.  Your physician wants you to follow-up in: 6 months. You will receive a reminder letter in the mail two months in advance. If you don't receive a letter, please call our office to schedule the follow-up appointment.  

## 2014-06-19 NOTE — Progress Notes (Signed)
Patient ID: [name] [name], male   DOB: July 03, 1925, 80 y.o.   MRN: [id number]    HPI  The patient is seen today to clear him to have maxillofacial surgery and surgery for his fractured arm. His cardiac status is stable. Recently he was working in his garden. He fell with significant injury to his arm and his face. Fortunately he has recovered well. He is very knowledgeable and he is very clear that he did not have syncope. He does have an injury to his face that require surgery along with an arm injury that require surgery. He is not having any chest pain or shortness of breath.  Allergies  Allergen Reactions  . Guaifenesin & Derivatives Other (See Comments)    Unknown  . Penicillins Other (See Comments)    Unknown  . Chlorhexidine Rash  . Oxycodone Itching and Rash  . Oxycontin [Oxycodone Hcl] Itching and Rash    Current Outpatient Prescriptions  Medication Sig Dispense Refill  . atorvastatin (LIPITOR) 10 MG tablet Take 1 tablet (10 mg total) by mouth daily.  30 tablet  9  . dronedarone (MULTAQ) 400 MG tablet Take 1 tablet (400 mg total) by mouth 2 (two) times daily with a meal.  60 tablet  6  . metoprolol tartrate (LOPRESSOR) 25 MG tablet Take 1 tablet (25 mg total) by mouth 2 (two) times daily.  180 tablet  3  . omeprazole (PRILOSEC) 20 MG capsule Take 20 mg by mouth. Take 1 tablet Monday, Wednesday, Friday, Saturday      . aspirin 81 MG tablet Take 81 mg by mouth 3 (three) times a week. Monday Wednesday and Friday.      . dabigatran (PRADAXA) 75 MG CAPS capsule Take 1 capsule (75 mg total) by mouth every 12 (twelve) hours.  60 capsule  5  . ferrous sulfate 300 (60 FE) MG/5ML syrup Take 5 mLs (300 mg total) by mouth 3 (three) times daily with meals.  300 mL  2   No current facility-administered medications for this visit.    History   Social History  . Marital Status: Widowed    Spouse Name: N/A    Number of Children: N/A  . Years of Education: N/A   Occupational History    . Not on file.   Social History Main Topics  . Smoking status: Former Research scientist (life sciences)  . Smokeless tobacco: Former Systems developer    Quit date: 08/01/1997  . Alcohol Use: 1.2 oz/week    1 Glasses of wine, 1 Shots of liquor per week  . Drug Use: No  . Sexual Activity: Not on file   Other Topics Concern  . Not on file   Social History Narrative  . No narrative on file    No family history on file.  Past Medical History  Diagnosis Date  . Coronary artery disease     minimal, cath 2005 / catheterization August, 2011, 30% in 3 vessels, normal LV function  . Hyperlipidemia   . Atrial fibrillation 2011, 2015    Multaq Started September, 2011, dose adjusted October, 2011, 200 mg a.m., 400 mg p.o.  . Monoclonal gammopathy     granfortuna  . TIA (transient ischemic attack)     Dr. Erling Cruz,, question in the past. when off ASA for 10 days  . GERD (gastroesophageal reflux disease)     Esophageal dilatation in 1992, some esophageal spasm  . S/P transurethral resection of prostate 2004    2004  . MR (mitral regurgitation) 2009  mild, Echo, July, 2011  . Cervical spine disease   . Renal insufficiency     Prior creatinine 1.2 /  September, 2011.. 1.7  /  October, 2011.. 1.8  . Ejection fraction     EF 60%, echo, 2009  /  TEE normal August, 2011 /   EF 60%, echo, July, 2011  . Drug therapy     Pradaxa Started July, 2011  . Aortic stenosis     Mild, echo, July, 2011  . Aortic insufficiency     Mild, echo, July, 2011  . Carotid artery disease     Doppler, 2008 no significant abnormality  . Diarrhea     Chronic  . Incomplete right bundle branch block     August, 2012  . Prostate cancer 11/22/2011  . Painless hematuria 03/27/2012    Occurred x 2 5/12 & 5/13 on Pradaxa 75 mg BID  . Bradycardia     Sinus bradycardia while on 25 Lopressor twice a day October, 2013  . Memory change     June, 2014  . Leg fatigue     June, 2014    Past Surgical History  Procedure Laterality Date  . History of turp     . Hernia repair    . Cataract extraction    . Fibular fracture repair    . Cardioversion  08/01/2012    Procedure: CARDIOVERSION;  Surgeon: Carlena Bjornstad, MD;  Location: [facility] ENDOSCOPY;  Service: Cardiovascular;  Laterality: N/A;  . Cardioversion N/A 11/29/2013    Procedure: CARDIOVERSION;  Surgeon: Carlena Bjornstad, MD;  Location: [facility] ENDOSCOPY;  Service: Cardiovascular;  Laterality: N/A;  . Colonoscopy    . Cardiac catheterization    . Eye surgery Bilateral     cataract extraction  . Shoulder arthroscopy with rotator cuff repair Right 01/23/2014    DR Noemi Chapel   . Shoulder arthroscopy with rotator cuff repair Right 01/23/2014    Procedure: RIGHT SHOULDER ARTHROSCOPY WITH DEBRIDEMENT AND ROTATOR CUFF REPAIR;  Surgeon: Lorn Junes, MD;  Location: [place];  Service: Orthopedics;  Laterality: Right;    Patient Active Problem List   Diagnosis Date Noted  . Incomplete right bundle branch block     Priority: High  . Atrial fibrillation     Priority: High  . Coronary artery disease     Priority: High  . MR (mitral regurgitation)     Priority: High  . Drug therapy     Priority: High  . Aortic stenosis     Priority: High  . Aortic insufficiency     Priority: High  . Facial fracture due to fall 06/07/2014  . Rotator cuff tear, right 01/23/2014  . GERD (gastroesophageal reflux disease)   . Skin lesion of back 12/24/2013  . Memory change   . Leg fatigue   . Bradycardia   . Carotid artery disease   . Painless hematuria 03/27/2012  . Prostate cancer 11/22/2011  . Hyperlipidemia   . Monoclonal gammopathy   . TIA (transient ischemic attack)   . S/P transurethral resection of prostate   . Cervical spine disease   . Renal insufficiency   . Ejection fraction   . DIARRHEA, CHRONIC 10/14/2010    ROS   Patient denies fever, chills, headache, sweats, rash, change in vision, change in hearing, chest pain, cough, nausea or vomiting, urinary symptoms. All other systems are reviewed and are  negative.  PHYSICAL EXAM  Patient looks quite good despite his recent injury. He is here  with his caregiver. He is oriented to person time and place. Affect is normal. He still has some ecchymoses on his chin and neck. There is no jugulovenous distention. Lungs are clear. Respiratory effort is nonlabored. Cardiac exam reveals S1 and S2. The rate is slow but regular. The abdomen is soft. There is no peripheral edema.  Filed Vitals:   06/19/14 1006  BP: 142/70  Pulse: 40  Height: 5\' 7"  (1.702 m)  Weight: 194 lb (87.998 kg)   I reviewed his EKG from June 08, 2014. He's been maintaining sinus rhythm. He has sinus bradycardia.  ASSESSMENT & PLAN

## 2014-06-19 NOTE — Assessment & Plan Note (Signed)
His rate is slower today in the high 40s. His blood pressure is stable and he is not having symptoms. I've instructed him to not take his beta blocker tomorrow morning before his surgery.

## 2014-06-19 NOTE — Pre-Procedure Instructions (Signed)
[name] [name]  06/19/2014   Your procedure is scheduled on:  Saturday August 8, at 0730 AM   Report to [facility] Admitting at 0730 AM.  Call this number if you have problems the morning of surgery: [phone]   Remember:   Do not eat food or drink liquids after midnight.   Take these medicines the morning of surgery with A SIP OF WATER: Dronedarone, Metoprolol and Prilosec. Pradaxa stopped 06/07/14  Do not wear jewelr  Do not wear lotions, powders, or perfumes. You may wear deodorant.  . Men may shave face and neck.  Do not bring valuables to the hospital.  [facility] is not responsible    for any belongings or valuables.               Contacts, dentures or bridgework may not be worn into surgery.  Leave suitcase in the car. After surgery it may be brought to your room.  For patients admitted to the hospital, discharge time is determined by your                treatment team.               Patients discharged the day of surgery will not be allowed to drive  home.    Special Instructions: Incentive Spirometry - Practice and bring it with you on the day of surgery. Shower with Dial soap night before and morning of surgery.  Please read over the following fact sheets that you were given: Pain Booklet, Coughing and Deep Breathing and Surgical Site Infection Prevention

## 2014-06-19 NOTE — Assessment & Plan Note (Signed)
He is holding sinus rhythm since his last cardioversion.

## 2014-06-19 NOTE — Assessment & Plan Note (Signed)
We know that he has only mild aortic stenosis and mild aortic insufficiency. No further workup at this time.

## 2014-06-19 NOTE — Assessment & Plan Note (Signed)
Fortunately he is stable after his fall. He does require surgery tomorrow.

## 2014-06-20 NOTE — Progress Notes (Signed)
Patient updated to arrive at 6 am and to come to Emergency Department for check in. Per Dr. Ron Parker patient instructed not to take beta blocker.

## 2014-06-21 ENCOUNTER — Encounter (HOSPITAL_COMMUNITY): Payer: Self-pay

## 2014-06-21 NOTE — Progress Notes (Signed)
Anesthesia Chart Review:  Patient is a 80 year old male scheduled for ORIF 5th metacarpal fracture (Dr. Amedeo Plenty) and ORIF mid face fractures, maxillomandibular fixation (Dr. Constance Holster) on 06/22/14. He sustained injuries during a recent fall. He is a retired Engineer, drilling Restaurant manager, fast food, by notes).  History includes mild CAD by 2011 cath, afib s/p multiple cardioversions, bradycardia, mild AS/AR, HLD, GERD, former smoker, TIA (no significant carotid stenosis in 2008 by notes), prostate cancer s/p radiofrequency ablation '00, anemia, renal insufficiency, cervical spine disease, TURP, IgG kappa monoclonal gammopathy of undetermined significance. Cardiologist is Dr. Ron Parker who cleared him for surgery.  Pradaxa is on hold.  Dr. Ron Parker also told him to hold his b-blocker on the morning of surgery due to bradycardia. Hematologist is Dr. Beryle Beams.  EKG on 06/08/14 showed SB @ 59 bpm, LAD, right BBB.  Echo on 08/23/12 showed: - Left ventricle: The cavity size was mildly dilated. Wall thickness was increased in a pattern of mild LVH. Systolic function was normal. The estimated ejection fraction was in the range of 60% to 65%. - Aortic valve: There was mild stenosis. Mild regurgitation. Mean gradient: 23mm Hg (S). Peak gradient: 20mm Hg (S). - Mitral Valve: Trivial regurgitation. - Left atrium: The atrium was moderately dilated. - Right ventricle: The cavity size was mildly dilated. - Right atrium: The atrium was moderately dilated.  Cardiac cath on 06/15/10 showed: Non-obstructive CAD with 30% ostial LM, 30-40% proximal LAD, 30% distal OM, 30% proximal and mid RCA stenosis.  CXR on 01/21/14 showed: 1. No evidence for acute cardiopulmonary abnormality. 2. Mid thoracic degenerative changes.  Preoperative labs noted.  BUN/Cr 22/1.90 (previously 17-21/1.55-1.80 since 11/28/13). H/H 10.5/30.8.  He has known CKD dating back to at least 2011.  His Cr is a little above his baseline (previously up to 1.8), but with need to  fracture repair.  I would anticipate that he could proceed with close peri/post-operative observation of I/O and renal function.    George Hugh [facility] Short Stay Center/Anesthesiology Phone [phone] 06/21/2014 10:22 AM

## 2014-06-22 ENCOUNTER — Encounter (HOSPITAL_COMMUNITY)
Admission: RE | Disposition: A | Discharge status: Home / Self Care | Payer: Self-pay | Source: Ambulatory Visit | Attending: Otolaryngology

## 2014-06-22 ENCOUNTER — Observation Stay (HOSPITAL_COMMUNITY)
Admission: RE | Admit: 2014-06-22 | Discharge: 2014-06-23 | Disposition: A | Discharge status: Home / Self Care | Payer: Medicare PPO | Source: Ambulatory Visit | Attending: Otolaryngology | Admitting: Otolaryngology

## 2014-06-22 ENCOUNTER — Encounter (HOSPITAL_COMMUNITY): Payer: Self-pay | Admitting: *Deleted

## 2014-06-22 ENCOUNTER — Encounter (HOSPITAL_COMMUNITY): Payer: Medicare PPO | Admitting: Anesthesiology

## 2014-06-22 ENCOUNTER — Ambulatory Visit (HOSPITAL_COMMUNITY): Payer: Medicare PPO | Admitting: Anesthesiology

## 2014-06-22 DIAGNOSIS — S02411G LeFort I fracture, subsequent encounter for fracture with delayed healing: Secondary | ICD-10-CM

## 2014-06-22 DIAGNOSIS — M26220 Open anterior occlusal relationship: Secondary | ICD-10-CM | POA: Diagnosis not present

## 2014-06-22 DIAGNOSIS — W010XXA Fall on same level from slipping, tripping and stumbling without subsequent striking against object, initial encounter: Secondary | ICD-10-CM | POA: Insufficient documentation

## 2014-06-22 DIAGNOSIS — Z87891 Personal history of nicotine dependence: Secondary | ICD-10-CM | POA: Insufficient documentation

## 2014-06-22 DIAGNOSIS — S02400A Malar fracture unspecified, initial encounter for closed fracture: Secondary | ICD-10-CM | POA: Insufficient documentation

## 2014-06-22 DIAGNOSIS — I359 Nonrheumatic aortic valve disorder, unspecified: Secondary | ICD-10-CM | POA: Insufficient documentation

## 2014-06-22 DIAGNOSIS — Z791 Long term (current) use of non-steroidal anti-inflammatories (NSAID): Secondary | ICD-10-CM | POA: Insufficient documentation

## 2014-06-22 DIAGNOSIS — K219 Gastro-esophageal reflux disease without esophagitis: Secondary | ICD-10-CM | POA: Insufficient documentation

## 2014-06-22 DIAGNOSIS — I129 Hypertensive chronic kidney disease with stage 1 through stage 4 chronic kidney disease, or unspecified chronic kidney disease: Secondary | ICD-10-CM | POA: Insufficient documentation

## 2014-06-22 DIAGNOSIS — Z9849 Cataract extraction status, unspecified eye: Secondary | ICD-10-CM | POA: Insufficient documentation

## 2014-06-22 DIAGNOSIS — E785 Hyperlipidemia, unspecified: Secondary | ICD-10-CM | POA: Insufficient documentation

## 2014-06-22 DIAGNOSIS — H9190 Unspecified hearing loss, unspecified ear: Secondary | ICD-10-CM | POA: Insufficient documentation

## 2014-06-22 DIAGNOSIS — Z01812 Encounter for preprocedural laboratory examination: Secondary | ICD-10-CM | POA: Insufficient documentation

## 2014-06-22 DIAGNOSIS — Z823 Family history of stroke: Secondary | ICD-10-CM | POA: Insufficient documentation

## 2014-06-22 DIAGNOSIS — M5030 Other cervical disc degeneration, unspecified cervical region: Secondary | ICD-10-CM | POA: Insufficient documentation

## 2014-06-22 DIAGNOSIS — S62309A Unspecified fracture of unspecified metacarpal bone, initial encounter for closed fracture: Secondary | ICD-10-CM | POA: Diagnosis not present

## 2014-06-22 DIAGNOSIS — Z85828 Personal history of other malignant neoplasm of skin: Secondary | ICD-10-CM | POA: Insufficient documentation

## 2014-06-22 DIAGNOSIS — Z79899 Other long term (current) drug therapy: Secondary | ICD-10-CM | POA: Insufficient documentation

## 2014-06-22 DIAGNOSIS — S02411A LeFort I fracture, initial encounter for closed fracture: Secondary | ICD-10-CM

## 2014-06-22 DIAGNOSIS — I779 Disorder of arteries and arterioles, unspecified: Secondary | ICD-10-CM | POA: Insufficient documentation

## 2014-06-22 DIAGNOSIS — Z8261 Family history of arthritis: Secondary | ICD-10-CM | POA: Insufficient documentation

## 2014-06-22 DIAGNOSIS — S02401A Maxillary fracture, unspecified, initial encounter for closed fracture: Principal | ICD-10-CM | POA: Insufficient documentation

## 2014-06-22 DIAGNOSIS — I2510 Atherosclerotic heart disease of native coronary artery without angina pectoris: Secondary | ICD-10-CM | POA: Insufficient documentation

## 2014-06-22 DIAGNOSIS — I4891 Unspecified atrial fibrillation: Secondary | ICD-10-CM | POA: Insufficient documentation

## 2014-06-22 DIAGNOSIS — Z7901 Long term (current) use of anticoagulants: Secondary | ICD-10-CM | POA: Insufficient documentation

## 2014-06-22 DIAGNOSIS — Z8673 Personal history of transient ischemic attack (TIA), and cerebral infarction without residual deficits: Secondary | ICD-10-CM | POA: Insufficient documentation

## 2014-06-22 DIAGNOSIS — N189 Chronic kidney disease, unspecified: Secondary | ICD-10-CM | POA: Insufficient documentation

## 2014-06-22 DIAGNOSIS — Z9889 Other specified postprocedural states: Secondary | ICD-10-CM | POA: Insufficient documentation

## 2014-06-22 DIAGNOSIS — M129 Arthropathy, unspecified: Secondary | ICD-10-CM | POA: Insufficient documentation

## 2014-06-22 DIAGNOSIS — D649 Anemia, unspecified: Secondary | ICD-10-CM | POA: Insufficient documentation

## 2014-06-22 DIAGNOSIS — Z7982 Long term (current) use of aspirin: Secondary | ICD-10-CM | POA: Insufficient documentation

## 2014-06-22 DIAGNOSIS — D472 Monoclonal gammopathy: Secondary | ICD-10-CM | POA: Insufficient documentation

## 2014-06-22 DIAGNOSIS — Z8546 Personal history of malignant neoplasm of prostate: Secondary | ICD-10-CM | POA: Insufficient documentation

## 2014-06-22 HISTORY — PX: OPEN REDUCTION INTERNAL FIXATION (ORIF) METACARPAL: SHX6234

## 2014-06-22 HISTORY — PX: ORIF ORBITAL FRACTURE: SHX5312

## 2014-06-22 SURGERY — OPEN REDUCTION INTERNAL FIXATION (ORIF) ORBITAL FRACTURE
Anesthesia: General | Laterality: Left

## 2014-06-22 MED ORDER — ONDANSETRON HCL 4 MG/2ML IJ SOLN
INTRAMUSCULAR | Status: AC
Start: 2014-06-22 — End: 2014-06-22
  Filled 2014-06-22: qty 2

## 2014-06-22 MED ORDER — PROPYLENE GLYCOL 0.6 % OP SOLN: 1.0000 [drp] | Freq: Four times a day (QID) | OPHTHALMIC | Status: DC | PRN

## 2014-06-22 MED ORDER — OXYCODONE HCL 5 MG/5ML PO SOLN: 5.0000 mg | Freq: Once | ORAL | Status: DC | PRN

## 2014-06-22 MED ORDER — ATORVASTATIN CALCIUM 10 MG PO TABS
10.0000 mg | ORAL_TABLET | Freq: Every day | ORAL | Status: DC
  Administered 2014-06-23: 10 mg via ORAL
  Filled 2014-06-22: qty 1

## 2014-06-22 MED ORDER — LACTATED RINGERS IV SOLN
INTRAVENOUS | Status: DC | PRN
  Administered 2014-06-22 (×2): via INTRAVENOUS

## 2014-06-22 MED ORDER — OXYMETAZOLINE HCL 0.05 % NA SOLN
NASAL | Status: AC
  Filled 2014-06-22: qty 15

## 2014-06-22 MED ORDER — PROPOFOL 10 MG/ML IV BOLUS
INTRAVENOUS | Status: DC | PRN
  Administered 2014-06-22: 100 mg via INTRAVENOUS

## 2014-06-22 MED ORDER — ACETAMINOPHEN 650 MG RE SUPP: 650.0000 mg | RECTAL | Status: DC | PRN

## 2014-06-22 MED ORDER — NAPROXEN SODIUM 220 MG PO TABS: 220.0000 mg | ORAL_TABLET | Freq: Every day | ORAL | Status: DC | PRN

## 2014-06-22 MED ORDER — LIDOCAINE-EPINEPHRINE 1 %-1:100000 IJ SOLN
INTRAMUSCULAR | Status: DC | PRN
  Administered 2014-06-22: 3 mL

## 2014-06-22 MED ORDER — DABIGATRAN ETEXILATE MESYLATE 75 MG PO CAPS
75.0000 mg | ORAL_CAPSULE | Freq: Two times a day (BID) | ORAL | Status: DC
  Filled 2014-06-22 (×2): qty 1

## 2014-06-22 MED ORDER — CLINDAMYCIN HCL 300 MG PO CAPS
300.0000 mg | ORAL_CAPSULE | Freq: Three times a day (TID) | ORAL | Status: DC
  Administered 2014-06-22 – 2014-06-23 (×2): 300 mg via ORAL
  Filled 2014-06-22 (×6): qty 1

## 2014-06-22 MED ORDER — ASPIRIN EC 81 MG PO TBEC: 81.0000 mg | DELAYED_RELEASE_TABLET | ORAL | Status: DC

## 2014-06-22 MED ORDER — ROCURONIUM BROMIDE 50 MG/5ML IV SOLN
INTRAVENOUS | Status: AC
  Filled 2014-06-22: qty 1

## 2014-06-22 MED ORDER — SUCCINYLCHOLINE CHLORIDE 20 MG/ML IJ SOLN
INTRAMUSCULAR | Status: DC | PRN
  Administered 2014-06-22: 100 mg via INTRAVENOUS

## 2014-06-22 MED ORDER — PANTOPRAZOLE SODIUM 40 MG PO TBEC: 40.0000 mg | DELAYED_RELEASE_TABLET | ORAL | Status: DC

## 2014-06-22 MED ORDER — FENTANYL CITRATE 0.05 MG/ML IJ SOLN
INTRAMUSCULAR | Status: AC
  Filled 2014-06-22: qty 5

## 2014-06-22 MED ORDER — MIDAZOLAM HCL 2 MG/2ML IJ SOLN
INTRAMUSCULAR | Status: AC
  Filled 2014-06-22: qty 2

## 2014-06-22 MED ORDER — BUPIVACAINE-EPINEPHRINE (PF) 0.5% -1:200000 IJ SOLN
INTRAMUSCULAR | Status: DC | PRN
  Administered 2014-06-22: 30 mL via PERINEURAL

## 2014-06-22 MED ORDER — MEPERIDINE HCL 25 MG/ML IJ SOLN: 6.2500 mg | INTRAMUSCULAR | Status: DC | PRN

## 2014-06-22 MED ORDER — PROPOFOL 10 MG/ML IV BOLUS
INTRAVENOUS | Status: AC
  Filled 2014-06-22: qty 20

## 2014-06-22 MED ORDER — DEXTROSE-NACL 5-0.9 % IV SOLN
INTRAVENOUS | Status: DC
  Administered 2014-06-22: 22:00:00 via INTRAVENOUS

## 2014-06-22 MED ORDER — DEXAMETHASONE SODIUM PHOSPHATE 4 MG/ML IJ SOLN
INTRAMUSCULAR | Status: DC | PRN
  Administered 2014-06-22: 8 mg via INTRAVENOUS

## 2014-06-22 MED ORDER — PROMETHAZINE HCL 25 MG PO TABS: 25.0000 mg | ORAL_TABLET | Freq: Four times a day (QID) | ORAL | Status: DC | PRN

## 2014-06-22 MED ORDER — FENTANYL CITRATE 0.05 MG/ML IJ SOLN
INTRAMUSCULAR | Status: DC | PRN
  Administered 2014-06-22: 50 ug via INTRAVENOUS
  Administered 2014-06-22: 100 ug via INTRAVENOUS
  Administered 2014-06-22 (×2): 50 ug via INTRAVENOUS

## 2014-06-22 MED ORDER — ACETAMINOPHEN 160 MG/5ML PO SOLN
650.0000 mg | ORAL | Status: DC | PRN
Start: 2014-06-22 — End: 2014-06-23
  Filled 2014-06-22: qty 20.3

## 2014-06-22 MED ORDER — ONDANSETRON HCL 4 MG/2ML IJ SOLN
INTRAMUSCULAR | Status: DC | PRN
  Administered 2014-06-22: 4 mg via INTRAVENOUS

## 2014-06-22 MED ORDER — METOPROLOL TARTRATE 25 MG PO TABS
25.0000 mg | ORAL_TABLET | Freq: Two times a day (BID) | ORAL | Status: DC
  Administered 2014-06-22: 25 mg via ORAL
  Filled 2014-06-22 (×5): qty 1

## 2014-06-22 MED ORDER — NAPROXEN 250 MG PO TABS
250.0000 mg | ORAL_TABLET | Freq: Every day | ORAL | Status: DC | PRN
  Filled 2014-06-22: qty 2

## 2014-06-22 MED ORDER — CLINDAMYCIN HCL 300 MG PO CAPS: 300.0000 mg | ORAL_CAPSULE | Freq: Three times a day (TID) | ORAL | Status: DC

## 2014-06-22 MED ORDER — OXYCODONE HCL 5 MG PO TABS: 5.0000 mg | ORAL_TABLET | Freq: Once | ORAL | Status: DC | PRN

## 2014-06-22 MED ORDER — HYDROMORPHONE HCL PF 1 MG/ML IJ SOLN: 0.2500 mg | INTRAMUSCULAR | Status: DC | PRN

## 2014-06-22 MED ORDER — MIDAZOLAM HCL 5 MG/5ML IJ SOLN
INTRAMUSCULAR | Status: DC | PRN
  Administered 2014-06-22 (×2): 1 mg via INTRAVENOUS

## 2014-06-22 MED ORDER — BUPIVACAINE HCL (PF) 0.25 % IJ SOLN
INTRAMUSCULAR | Status: AC
  Filled 2014-06-22: qty 30

## 2014-06-22 MED ORDER — POLYVINYL ALCOHOL 1.4 % OP SOLN
1.0000 [drp] | OPHTHALMIC | Status: DC | PRN
  Filled 2014-06-22: qty 15

## 2014-06-22 MED ORDER — ONDANSETRON HCL 4 MG/2ML IJ SOLN
4.0000 mg | Freq: Once | INTRAMUSCULAR | Status: DC | PRN
Start: 2014-06-22 — End: 2014-06-22

## 2014-06-22 MED ORDER — LIDOCAINE HCL (CARDIAC) 20 MG/ML IV SOLN
INTRAVENOUS | Status: DC | PRN
  Administered 2014-06-22: 100 mg via INTRAVENOUS

## 2014-06-22 MED ORDER — PROMETHAZINE HCL 25 MG RE SUPP: 25.0000 mg | Freq: Four times a day (QID) | RECTAL | Status: DC | PRN

## 2014-06-22 MED ORDER — LIDOCAINE HCL (CARDIAC) 20 MG/ML IV SOLN
INTRAVENOUS | Status: AC
  Filled 2014-06-22: qty 5

## 2014-06-22 MED ORDER — GLYCOPYRROLATE 0.2 MG/ML IJ SOLN
INTRAMUSCULAR | Status: AC
  Filled 2014-06-22: qty 3

## 2014-06-22 MED ORDER — DRONEDARONE HCL 400 MG PO TABS
400.0000 mg | ORAL_TABLET | Freq: Two times a day (BID) | ORAL | Status: DC
  Administered 2014-06-22 – 2014-06-23 (×2): 400 mg via ORAL
  Filled 2014-06-22 (×4): qty 1

## 2014-06-22 MED ORDER — NEOSTIGMINE METHYLSULFATE 10 MG/10ML IV SOLN
INTRAVENOUS | Status: AC
  Filled 2014-06-22: qty 1

## 2014-06-22 SURGICAL SUPPLY — 79 items
BANDAGE ELASTIC 3 VELCRO ST LF (GAUZE/BANDAGES/DRESSINGS) IMPLANT
BANDAGE ELASTIC 4 VELCRO ST LF (GAUZE/BANDAGES/DRESSINGS) ×2 IMPLANT
BNDG COHESIVE 1X5 TAN STRL LF (GAUZE/BANDAGES/DRESSINGS) IMPLANT
BNDG GAUZE ELAST 4 BULKY (GAUZE/BANDAGES/DRESSINGS) ×6 IMPLANT
CANISTER SUCTION 2500CC (MISCELLANEOUS) ×4 IMPLANT
CLEANER TIP ELECTROSURG 2X2 (MISCELLANEOUS) ×4 IMPLANT
CORDS BIPOLAR (ELECTRODE) ×4 IMPLANT
COVER SURGICAL LIGHT HANDLE (MISCELLANEOUS) ×4 IMPLANT
CUFF TOURNIQUET SINGLE 18IN (TOURNIQUET CUFF) ×4 IMPLANT
CUFF TOURNIQUET SINGLE 24IN (TOURNIQUET CUFF) IMPLANT
DECANTER SPIKE VIAL GLASS SM (MISCELLANEOUS) ×2 IMPLANT
DRAPE OEC MINIVIEW 54X84 (DRAPES) ×2 IMPLANT
DRAPE SURG 17X23 STRL (DRAPES) ×4 IMPLANT
DRSG ADAPTIC 3X8 NADH LF (GAUZE/BANDAGES/DRESSINGS) ×2 IMPLANT
ELECT COATED BLADE 2.86 ST (ELECTRODE) ×2 IMPLANT
ELECT NDL TIP 2.8 STRL (NEEDLE) IMPLANT
ELECT NEEDLE TIP 2.8 STRL (NEEDLE) IMPLANT
ELECT REM PT RETURN 9FT ADLT (ELECTROSURGICAL) ×4
ELECTRODE REM PT RTRN 9FT ADLT (ELECTROSURGICAL) ×2 IMPLANT
GAUZE SPONGE 2X2 8PLY STRL LF (GAUZE/BANDAGES/DRESSINGS) IMPLANT
GAUZE SPONGE 4X4 12PLY STRL (GAUZE/BANDAGES/DRESSINGS) IMPLANT
GAUZE SPONGE 4X4 16PLY XRAY LF (GAUZE/BANDAGES/DRESSINGS) ×2 IMPLANT
GAUZE XEROFORM 1X8 LF (GAUZE/BANDAGES/DRESSINGS) IMPLANT
GAUZE XEROFORM 5X9 LF (GAUZE/BANDAGES/DRESSINGS) ×2 IMPLANT
GLOVE BIOGEL M STRL SZ7.5 (GLOVE) ×2 IMPLANT
GLOVE ECLIPSE 7.5 STRL STRAW (GLOVE) ×4 IMPLANT
GLOVE SS BIOGEL STRL SZ 8 (GLOVE) ×2 IMPLANT
GLOVE SUPERSENSE BIOGEL SZ 8 (GLOVE) ×2
GOWN STRL REUS W/ TWL LRG LVL3 (GOWN DISPOSABLE) ×8 IMPLANT
GOWN STRL REUS W/ TWL XL LVL3 (GOWN DISPOSABLE) ×6 IMPLANT
GOWN STRL REUS W/TWL LRG LVL3 (GOWN DISPOSABLE) ×16
GOWN STRL REUS W/TWL XL LVL3 (GOWN DISPOSABLE) ×12
KIT BASIN OR (CUSTOM PROCEDURE TRAY) ×8 IMPLANT
KIT ROOM TURNOVER OR (KITS) ×6 IMPLANT
MANIFOLD NEPTUNE II (INSTRUMENTS) ×4 IMPLANT
NDL HYPO 25GX1X1/2 BEV (NEEDLE) IMPLANT
NEEDLE 27GAX1X1/2 (NEEDLE) ×4 IMPLANT
NEEDLE HYPO 25GX1X1/2 BEV (NEEDLE) IMPLANT
NS IRRIG 1000ML POUR BTL (IV SOLUTION) ×8 IMPLANT
PACK ORTHO EXTREMITY (CUSTOM PROCEDURE TRAY) ×4 IMPLANT
PAD ARMBOARD 7.5X6 YLW CONV (MISCELLANEOUS) ×12 IMPLANT
PAD CAST 4YDX4 CTTN HI CHSV (CAST SUPPLIES) IMPLANT
PADDING CAST COTTON 4X4 STRL (CAST SUPPLIES) ×4
PENCIL FOOT CONTROL (ELECTRODE) ×4 IMPLANT
PLATE LOCKING 1.5 Y SHAPE (Plate) ×2 IMPLANT
PLATE MID FACE 5H L 2MM (Orthopedic Implant) ×4 IMPLANT
PROTECTOR CORNEAL (OPHTHALMIC RELATED) IMPLANT
SCISSORS WIRE DISP (INSTRUMENTS) ×2 IMPLANT
SCREW MIDFACE 1.7X4MM SLF TAP (Screw) ×8 IMPLANT
SCREW PEG 13MM (Screw) ×2 IMPLANT
SCREW PEG 2.5X12 NONLOCK (Screw) ×2 IMPLANT
SCREW PEG 2.5X18 NONLOCK (Screw) ×4 IMPLANT
SCREW PEG LOCK 2.5X11 (Screw) ×2 IMPLANT
SCREW PEG LOCK 2.5X12 (Screw) ×2 IMPLANT
SCREW UPPER FACE 2.0X12MM (Screw) ×4 IMPLANT
SCREW UPPER FACE 2.0X8MM (Screw) ×4 IMPLANT
SOLUTION BETADINE 4OZ (MISCELLANEOUS) ×4 IMPLANT
SPECIMEN JAR SMALL (MISCELLANEOUS) ×4 IMPLANT
SPONGE GAUZE 2X2 STER 10/PKG (GAUZE/BANDAGES/DRESSINGS)
SPONGE SCRUB IODOPHOR (GAUZE/BANDAGES/DRESSINGS) ×4 IMPLANT
SUCTION FRAZIER TIP 10 FR DISP (SUCTIONS) ×2 IMPLANT
SUT CHROMIC 3 0 PS 2 (SUTURE) ×2 IMPLANT
SUT MERSILENE 4 0 P 3 (SUTURE) IMPLANT
SUT PROLENE 4 0 PS 2 18 (SUTURE) ×2 IMPLANT
SUT STEEL 0 (SUTURE) ×4
SUT STEEL 0 18XMFL TIE 17 (SUTURE) IMPLANT
SUT STEEL 2 (SUTURE) IMPLANT
SUT VIC AB 2-0 CT1 27 (SUTURE)
SUT VIC AB 2-0 CT1 TAPERPNT 27 (SUTURE) IMPLANT
SUT VICRYL 4-0 PS2 18IN ABS (SUTURE) ×4 IMPLANT
SYR CONTROL 10ML LL (SYRINGE) IMPLANT
TOWEL OR 17X24 6PK STRL BLUE (TOWEL DISPOSABLE) ×4 IMPLANT
TOWEL OR 17X26 10 PK STRL BLUE (TOWEL DISPOSABLE) ×8 IMPLANT
TRAY ENT MC OR (CUSTOM PROCEDURE TRAY) ×4 IMPLANT
TUBE CONNECTING 12'X1/4 (SUCTIONS)
TUBE CONNECTING 12X1/4 (SUCTIONS) IMPLANT
UNDERPAD 30X30 INCONTINENT (UNDERPADS AND DIAPERS) ×4 IMPLANT
WASHER 2.5 THREADED (Orthopedic Implant) ×2 IMPLANT
WATER STERILE IRR 1000ML POUR (IV SOLUTION) ×4 IMPLANT

## 2014-06-22 NOTE — Progress Notes (Addendum)
Pharmacy regarding dabigatran Pt is a 45 yom s/p maxillofacial/R arm surgery on 8/8. Pt on dabigatran 75mg  q12h PTA for afib (last dose 2 weeks ago). Dose appropriate for pt currently on dronedarone. SCr 1.9, CrCl~34. No bleeding documented.  Elicia Lamp, PharmD Clinical Pharmacist - Resident Pager [phone] 06/22/2014 2:01 PM

## 2014-06-22 NOTE — Interval H&P Note (Signed)
History and Physical Interval Note:  06/22/2014 7:22 AM  [name] [name]  has presented today for surgery, with the diagnosis of LEFORTE 1 FRACTURE OF MAXILLA BILATERAL  The various methods of treatment have been discussed with the patient and family. After consideration of risks, benefits and other options for treatment, the patient has consented to  Procedure(s): OPEN REDUCTION INTERNAL FIXATION (ORIF) BILATERAL LEFORTE1 FRACTURE OF MAXILLA, HYBRID ARCH BARS  (Bilateral) OPEN REDUCTION INTERNAL FIXATION (ORIF) LEFT HAND METACARPAL (Left) as a surgical intervention .  The patient's history has been reviewed, patient examined, no change in status, stable for surgery.  I have reviewed the patient's chart and labs.  Questions were answered to the patient's satisfaction.     [name] [name]

## 2014-06-22 NOTE — Anesthesia Preprocedure Evaluation (Signed)
Anesthesia Evaluation  Patient identified by MRN, date of birth, ID band Patient awake    Reviewed: Allergy & Precautions, H&P , NPO status , Patient's Chart, lab work & pertinent test results  Airway Mallampati: I TM Distance: >3 FB Neck ROM: Full    Dental   Pulmonary former smoker,          Cardiovascular + dysrhythmias     Neuro/Psych    GI/Hepatic GERD-  Controlled,  Endo/Other    Renal/GU Renal disease     Musculoskeletal   Abdominal   Peds  Hematology   Anesthesia Other Findings   Reproductive/Obstetrics                           Anesthesia Physical Anesthesia Plan  ASA: III  Anesthesia Plan: General   Post-op Pain Management:    Induction: Intravenous  Airway Management Planned: Oral ETT  Additional Equipment:   Intra-op Plan:   Post-operative Plan: Extubation in OR  Informed Consent: I have reviewed the patients History and Physical, chart, labs and discussed the procedure including the risks, benefits and alternatives for the proposed anesthesia with the patient or authorized representative who has indicated his/her understanding and acceptance.     Plan Discussed with: CRNA and Surgeon  Anesthesia Plan Comments:         Anesthesia Quick Evaluation

## 2014-06-22 NOTE — Op Note (Signed)
OPERATIVE REPORT  DATE OF SURGERY: 06/22/2014  PATIENT:  [name] [name],  80 y.o. male  PRE-OPERATIVE DIAGNOSIS:  LEFORTE 1 FRACTURE OF MAXILLA BILATERAL  POST-OPERATIVE DIAGNOSIS:  * No post-op diagnosis entered *  PROCEDURE:  Procedure(s): OPEN REDUCTION INTERNAL FIXATION (ORIF) BILATERAL LEFORTE1 FRACTURE OF MAXILLA, HYBRID ARCH BARS  OPEN REDUCTION INTERNAL FIXATION (ORIF) LEFT HAND METACARPAL  SURGEON:  Beckie Salts, MD  ASSISTANTS: none  ANESTHESIA:   General   EBL:  50 ml  DRAINS: none  LOCAL MEDICATIONS USED:  1% Xylocaine with epinephrine  SPECIMEN:  none  COUNTS:  Correct  PROCEDURE DETAILS: The patient was taken to the operating room and placed on the operating table in the supine position. Following induction of general endotracheal anesthesia using a nasotracheal tube, the face was draped in a standard fashion. A  cheek retractor was used throughout the case. Local anesthetic was infiltrated into the gingival mucosa upper and lower at the proposed incision sites. Light cautery used to create a tunnel incision through the canine fossa mucosa exposing the maxillary bone. Freer elevator was used to elevate the periosteum off the bone and the lateral fractures were identified on both sides. Using a large tenaculum the mid face fracture was reduced and the impacted bone was released. The occlusion was then lined up and secured using bicortical screws 2 in the maxilla 8 mm and 2 in the mandible 12 mm. 24-gauge wire was used to create the MMF. The occlusion looked good. The fractures were plated with the 1.8 mm midface plating system using L-shaped plates on both sides each with 4 screws, 4 mm length. The wounds were irrigated the MMF was released. The bicortical screws were removed. The incisions were reapproximated with running 3-0 Vicryl sutures. Occlusion was checked again and was in good shape and stable. The orthopedic procedure was completed around the same time. The  patient was awakened, extubated and transferred to recovery in stable condition.    PATIENT DISPOSITION:  To PACU, stable

## 2014-06-22 NOTE — Progress Notes (Signed)
Orthopedic Tech Progress Note Patient Details:  [name] [name] 09-05-1925 [id number]  Ortho Devices Type of Ortho Device: Arm sling Ortho Device/Splint Location: LUE Ortho Device/Splint Interventions: [name] 06/22/2014, 11:13 AM

## 2014-06-22 NOTE — Transfer of Care (Signed)
Immediate Anesthesia Transfer of Care Note  Patient: [name] [name]  Procedure(s) Performed: Procedure(s): OPEN REDUCTION INTERNAL FIXATION (ORIF) BILATERAL LEFORTE1 FRACTURE OF MAXILLA, HYBRID ARCH BARS  (Bilateral) OPEN REDUCTION INTERNAL FIXATION (ORIF) LEFT HAND METACARPAL (Left)  Patient Location: PACU  Anesthesia Type:General  Level of Consciousness: awake, alert , oriented and patient cooperative  Airway & Oxygen Therapy: Patient Spontanous Breathing and Patient connected to nasal cannula oxygen  Post-op Assessment: Report given to PACU RN and Post -op Vital signs reviewed and stable  Post vital signs: Reviewed and stable  Complications: No apparent anesthesia complications

## 2014-06-22 NOTE — Op Note (Signed)
NAMEMarland Kitchen  RONE, [name] NO.:  [id number]  MEDICAL RECORD NO.:  [id number]  LOCATION:  5N04C                        FACILITY:  [facility]  PHYSICIAN:  Marsean Hoeger. Krissa Utke, M.D.DATE OF BIRTH:  February 03, 1925  DATE OF PROCEDURE: DATE OF DISCHARGE:                              OPERATIVE REPORT   PREOPERATIVE DIAGNOSIS:  Displaced fifth metacarpal fracture, left hand.  POSTOPERATIVE DIAGNOSIS:  Displaced fifth metacarpal fracture, left hand.  PROCEDURES: 1. Open reduction and internal fixation of fifth metacarpal fracture,     left hand. 2. AP, lateral, and oblique x-rays performed, examined and interpreted     by myself, left hand. 3. Dorsal sensory ulnar nerve neurolysis, this was the dorsal sensory     cutaneous branch.  SURGEON:  [name] Ano. Amanda Pea, MD.  ASSISTANT:  None.  COMPLICATIONS:  None.  ANESTHESIA:  General.  TOURNIQUET TIME:  Less than an hour.  INDICATIONS:  A pleasant male who is 80 years of age, and a retired Development worker, community.  He has an acutely displaced fracture.  He understands risks and benefits and desires to proceed.  OPERATIVE PROCEDURE:  The patient was seen by myself and Anesthesia.  He was given a block.  Following this, he was prepped and draped in usual sterile fashion after general anesthesia was induced.  This was a nasotracheal intubation due to the fact that Dr. Pollyann [name] was performing oral surgery for his displaced fractures.  The patient understood this.  In tandem, we secured 2 sterile fields, 1 about the facial region, 1 about the arm.  These were completely separated the entire case and Dr. Pollyann [name] will dictate his operative report for the facial issues.  My operation commenced with tourniquet insufflation after thorough prep and drape and sterile field being secured.  An incision was made. Dissection was carried down, and the patient underwent a very careful and cautious approach to the metacarpal.  I performed a dorsal sensory ulnar  nerve neurolysis.  This was done without difficulty to my satisfaction.  This was a meticulous neurolysis performed without difficulty due to the fact that this nerve was scarred in against the inflamed and hematoma type tissue.  Once it was swept out of away, I then approached the fracture.  The EDM and EDC tendons to the small finger were carefully handled and the fracture was approached. Following this, the fracture was then reduced and fixated with a 2.5 mm ALPS plate from [employer].  This achieved excellent fixation.  I was able to get 2 good screws in the very proximal fragment which was the most important aspect of getting this fracture fixated.  The patient tolerated this quite well.  There were no complicating features.  AP, lateral, and oblique x-rays looked excellent.  I used standard AO technique to apply the plate, noted excellent range of motion passively and good position of the hand.  His finger has a radial deviatory tendency about the DIP and PIP joint from prior arthritic issues and injury.  The patient and his family state that they did not remember his finger in turning; however, there is no gross ligamentous or tendon disruption here and there is no bony fracture on the MCP joint which is  notable.  X-rays were taken, given to the patient's family.  The wound was irrigated.  Periosteal tissue was closed with 3-0 Vicryl, and skin edge was closed with Prolene.  We will plan for suture removal in 10-14 days.  We will cast him at the time in a 4-week removable brace.  I have discussed with him the relevant issues, do's and don'ts.  I want to go slow and easy given the fact that he is 80 years of age. No strengthening before 6-8 weeks.  No piano playing or heavy golf before 8-10 weeks.  It has been a pleasure to see him today.  He will be discharged home once Dr. Pollyann [name] feels he is stable.  Dr. Pollyann [name] plans for clindamycin and pain medicine.  This will be fine for my count.   I have discussed with his family all issues, plans, etc.  We will see him back in the office as outlined in 12-14 days.  No call for an appointment.  Note, it was an absolute pleasure seeing __________ in his postop recovery.  This was an uneventful ORIF.     [name] [name], M.D.     [facility]  D:  06/22/2014  T:  06/22/2014  Job:  [id number]

## 2014-06-22 NOTE — Anesthesia Postprocedure Evaluation (Signed)
Anesthesia Post Note  Patient: [name] [name]  Procedure(s) Performed: Procedure(s) (LRB): OPEN REDUCTION INTERNAL FIXATION (ORIF) BILATERAL LEFORTE1 FRACTURE OF MAXILLA, HYBRID ARCH BARS  (Bilateral) OPEN REDUCTION INTERNAL FIXATION (ORIF) LEFT HAND METACARPAL (Left)  Anesthesia type: general  Patient location: PACU  Post pain: Pain level controlled  Post assessment: Patient's Cardiovascular Status Stable  Last Vitals:  Filed Vitals:   06/22/14 1054  BP: 168/72  Pulse: 59  Temp: 36.6 C  Resp: 18    Post vital signs: Reviewed and stable  Level of consciousness: sedated  Complications: No apparent anesthesia complications

## 2014-06-22 NOTE — Op Note (Signed)
See dictation #[id number] Amedeo Plenty MD

## 2014-06-22 NOTE — H&P (Signed)
[name] [name] is an 80 y.o. male.   Chief Complaint: Fracture left hand HPI: Patient presents for open reduction internal fixation left hand fracture.  Patient notes no other injury in his left arm. Elbow and wrist are nontender. He has facial fractures which will be attended to by Dr. Constance Holster  He denies neck back chest or abdominal pain  Past Medical History  Diagnosis Date  . Coronary artery disease     minimal, cath 2005 / catheterization August, 2011, 30% in 3 vessels, normal LV function  . Hyperlipidemia   . Atrial fibrillation 2011, 2015    Multaq Started September, 2011, dose adjusted October, 2011, 200 mg a.m., 400 mg p.o.  . Monoclonal gammopathy     granfortuna  . TIA (transient ischemic attack)     Dr. Erling Cruz,, question in the past. when off ASA for 10 days  . GERD (gastroesophageal reflux disease)     Esophageal dilatation in 1992, some esophageal spasm  . S/P transurethral resection of prostate 2004    2004  . MR (mitral regurgitation) 2009    mild, Echo, July, 2011  . Cervical spine disease   . Ejection fraction     EF 60%, echo, 2009  /  TEE normal August, 2011 /   EF 60%, echo, July, 2011  . Drug therapy     Pradaxa Started July, 2011  . Aortic stenosis     Mild, echo, July, 2011  . Aortic insufficiency     Mild, echo, July, 2011  . Carotid artery disease     Doppler, 2008 no significant abnormality  . Diarrhea     Chronic  . Incomplete right bundle branch block     August, 2012  . Prostate cancer 11/22/2011  . Painless hematuria 03/27/2012    Occurred x 2 5/12 & 5/13 on Pradaxa 75 mg BID  . Bradycardia     Sinus bradycardia while on 25 Lopressor twice a day October, 2013  . Memory change     June, 2014  . Leg fatigue     June, 2014  . Anemia   . Renal insufficiency     Prior creatinine 1.2 /  September, 2011.. 1.7  /  October, 2011.. 1.8    Past Surgical History  Procedure Laterality Date  . History of turp    . Hernia repair    . Cataract  extraction    . Fibular fracture repair    . Cardioversion  08/01/2012    Procedure: CARDIOVERSION;  Surgeon: Carlena Bjornstad, MD;  [facility] END[facility]COPY;  Service: Cardiovascular;  Laterality: N/A;  . Cardioversion N/A 11/29/2013    Procedure: CARDIOVERSION;  Surgeon: Carlena Bjornstad, MD;  Location[facility] ENDOSCOPY;  Service: Cardiovascular;  Laterality: N/A;  . Colonoscopy    . Cardiac catheterization    . Eye surgery Bilateral     cataract extraction  . Shoulder arthroscopy with rotator cuff repair Right 01/23/2014    DR Noemi Chapel   . Shoulder arthroscopy with rotator cuff repair Right 01/23/2014    Procedure: RIGHT SHOULDER ARTHROSCOPY WITH DEBRIDEMENT AND ROTATOR CUFF REPAIR;  Surgeon: Lorn Junes, MD;  Location: [place];  Service: Orthopedics;  Laterality: Right;    History reviewed. No pertinent family history. Social History:  reports that he has quit smoking. He has never used smokeless tobacco. He reports that he drinks about 1.2 ounces of alcohol per week. He reports that he does not use illicit drugs.  Allergies:  Allergies  Allergen Reactions  . Guaifenesin & Derivatives Other (See Comments)    Unknown  . Penicillins Other (See Comments)    Unknown  . Chlorhexidine Rash  . Oxycodone Itching and Rash  . Oxycontin [Oxycodone Hcl] Itching and Rash    Medications Prior to Admission  Medication Sig Dispense Refill  . atorvastatin (LIPITOR) 10 MG tablet Take 1 tablet (10 mg total) by mouth daily.  30 tablet  9  . dronedarone (MULTAQ) 400 MG tablet Take 1 tablet (400 mg total) by mouth 2 (two) times daily with a meal.  60 tablet  6  . metoprolol tartrate (LOPRESSOR) 25 MG tablet Take 1 tablet (25 mg total) by mouth 2 (two) times daily.  180 tablet  3  . omeprazole (PRILOSEC) 20 MG capsule Take 20 mg by mouth See admin instructions. Take 1 tablet Monday, Wednesday, Friday, Saturday      . aspirin 81 MG tablet Take 81 mg by mouth 3 (three) times a week. Monday Wednesday and Friday.       . dabigatran (PRADAXA) 75 MG CAPS capsule Take 1 capsule (75 mg total) by mouth every 12 (twelve) hours.  60 capsule  5  . naproxen sodium (ANAPROX) 220 MG tablet Take 220-440 mg by mouth daily as needed (for pain).      . Propylene Glycol (SYSTANE BALANCE) 0.6 % SOLN Apply 1-2 drops to eye every 6 (six) hours as needed (for dryness/irritation).        No results found for this or any previous visit (from the past 48 hour(s)). No results found.  Review of Systems  Constitutional: Negative.   Respiratory: Negative.   Neurological: Negative.   Psychiatric/Behavioral: Negative.    see chart  Blood pressure 145/58, pulse 50, temperature 98.2 F (36.8 C), temperature source Oral, resp. rate 16, SpO2 100.00%. Physical Exam   Left hand swelling and pain he has obvious deformity about the fifth carpometacarpal joint.  Patient and I reviewed this at length.  His upper extremity examination reveals no evidence of abnormality in the right arm although he does bruise easily.  I reviewed his x-rays.  Chest is clear abdomen soft tender.  Facial injuries are per Dr. Constance Holster  Assessment/Plan We will plan open reduction internal fixation left hand (fifth metacarpal fracture).  We are planning surgery for your upper extremity. The risk and benefits of surgery include risk of bleeding infection anesthesia damage to normal structures and failure of the surgery to accomplish its intended goals of relieving symptoms and restoring function with this in mind we'll going to proceed. I have specifically discussed with the patient the pre-and postoperative regime and the does and don'ts and risk and benefits in great detail. Risk and benefits of surgery also include risk of dystrophy chronic nerve pain failure of the healing process to go onto completion and other inherent risks of surgery The relavent the pathophysiology of the disease/injury process, as well as the alternatives for treatment and  postoperative course of action has been discussed in great detail with the patient who desires to proceed.  We will do everything in our power to help you (the patient) restore function to the upper extremity. Is a pleasure to see this patient today.   DELRICO, MINEHART 06/22/2014, 7:48 AM

## 2014-06-22 NOTE — Anesthesia Procedure Notes (Addendum)
Anesthesia Regional Block:  Supraclavicular block  Pre-Anesthetic Checklist: ,, timeout performed, Correct Patient, Correct Site, Correct Laterality, Correct Procedure, Correct Position, site marked, Risks and benefits discussed,  Surgical consent,  Pre-op evaluation,  At surgeon's request and post-op pain management  Laterality: Left  Prep: chloraprep       Needles:  Injection technique: Single-shot  Needle Type: Echogenic Stimulator Needle     Needle Length: 9cm 9 cm Needle Gauge: 21 and 21 G    Additional Needles:  Procedures: ultrasound guided (picture in chart) and nerve stimulator Supraclavicular block  Nerve Stimulator or Paresthesia:  Response: 0.4 mA,   Additional Responses:   Narrative:  Start time: 06/22/2014 7:15 AM End time: 06/22/2014 7:25 AM Injection made incrementally with aspirations every 5 mL.  Performed by: Personally  Anesthesiologist: Lillia Abed MD  Additional Notes: Monitors applied. Patient sedated. Sterile prep and drape,hand hygiene and sterile gloves were used. Relevant anatomy identified.Needle position confirmed.Local anesthetic injected incrementally after negative aspiration. Local anesthetic spread visualized around nerve(s). Vascular puncture avoided. No complications. Image printed for medical record.The patient tolerated the procedure well.

## 2014-06-23 DIAGNOSIS — S02400A Malar fracture unspecified, initial encounter for closed fracture: Secondary | ICD-10-CM | POA: Diagnosis not present

## 2014-06-23 MED ORDER — DABIGATRAN ETEXILATE MESYLATE 75 MG PO CAPS
75.0000 mg | ORAL_CAPSULE | Freq: Two times a day (BID) | ORAL | Status: DC
  Filled 2014-06-23 (×2): qty 1

## 2014-06-23 NOTE — Discharge Summary (Signed)
Physician Discharge Summary  Patient ID: [name] [name] MRN: [id number] DOB/AGE: 1925/09/23 80 y.o.  Admit date: 06/22/2014 Discharge date: 06/23/2014  Admission Diagnoses: LeFort 1 maxillary fracture  Discharge Diagnoses:  Active Problems:   LeFort I fracture of maxilla   Discharged Condition: good  Hospital Course: 80 year old male presented to hospital for open repair of LeFort 1 fracture.  Dr. Amanda Pea also to perform surgery on left upper extremity.  See operative note.  He was observed overnight after surgery and did fairly well.  He had a brief time of anxiety and facial redness that was self-limited.  On POD 1, he is felt stable for discharge.  He reports some numbness of his lips and a much improved bite, though perhaps slightly different than before his injury.  He still feels numbness of the left hand.  Consults: None  Significant Diagnostic Studies: None  Treatments: surgery: ORIF LeFort 1 fractures, Left upper extremity surgery  Discharge Exam: Blood pressure 152/53, pulse 53, temperature 98.5 F (36.9 C), temperature source Oral, resp. rate 16, SpO2 97.00%. General appearance: alert, cooperative and no distress Head: Midface without much edema.  Occlusion fairly good.  No oral bleeding.  Disposition: 01-Home or Self Care  Discharge Instructions   Diet - low sodium heart healthy    Complete by:  As directed      Discharge instructions    Complete by:  As directed   Rinse mouth with salt water after meals and at bedtime.  Sleep with head elevated.  Soft diet.     Increase activity slowly    Complete by:  As directed             Medication List         aspirin 81 MG tablet  Take 81 mg by mouth 3 (three) times a week. Monday Wednesday and Friday.     atorvastatin 10 MG tablet  Commonly known as:  LIPITOR  Take 1 tablet (10 mg total) by mouth daily.     clindamycin 300 MG capsule  Commonly known as:  CLEOCIN  Take 1 capsule (300 mg total) by mouth 3  (three) times daily.     dabigatran 75 MG Caps capsule  Commonly known as:  PRADAXA  Take 1 capsule (75 mg total) by mouth every 12 (twelve) hours.     dronedarone 400 MG tablet  Commonly known as:  MULTAQ  Take 1 tablet (400 mg total) by mouth 2 (two) times daily with a meal.     metoprolol tartrate 25 MG tablet  Commonly known as:  LOPRESSOR  Take 1 tablet (25 mg total) by mouth 2 (two) times daily.     naproxen sodium 220 MG tablet  Commonly known as:  ANAPROX  Take 220-440 mg by mouth daily as needed (for pain).     omeprazole 20 MG capsule  Commonly known as:  PRILOSEC  Take 20 mg by mouth See admin instructions. Take 1 tablet Monday, Wednesday, Friday, Saturday     SYSTANE BALANCE 0.6 % Soln  Generic drug:  Propylene Glycol  Apply 1-2 drops to eye every 6 (six) hours as needed (for dryness/irritation).           Follow-up Information   Follow up with Serena Colonel, MD. Schedule an appointment as soon as possible for a visit in 1 week.   Specialty:  Otolaryngology   Contact information:   [address] [place] [address] [phone]  SignedJenne Pane, Maniah Nading 06/23/2014, 8:13 AM

## 2014-06-23 NOTE — Discharge Instructions (Signed)
Eat only soft foods. Rinse mouth with saltwater 3 times daily. Brush teeth as you normally do.  Information on my medicine - Pradaxa (dabigatran)  This medication education was reviewed with me or my healthcare representative as part of my discharge preparation.  The pharmacist that spoke with me during my hospital stay was:  Romona Curls, [facility]  Why was Pradaxa prescribed for you? Pradaxa was prescribed for you to reduce the risk of forming blood clots that cause a stroke if you have a medical condition called atrial fibrillation (a type of irregular heartbeat).    What do you Need to know about PradAXa? Take your Pradaxa TWICE DAILY - one capsule in the morning and one tablet in the evening with or without food.  It would be best to take the doses about the same time each day.  The capsules should not be broken, chewed or opened - they must be swallowed whole.  Do not store Pradaxa in other medication containers - once the bottle is opened the Pradaxa should be used within FOUR months; throw away any capsules that havent been by that time.  Take Pradaxa exactly as prescribed by your doctor.  DO NOT stop taking Pradaxa without talking to the doctor who prescribed the medication.  Stopping without other stroke prevention medication to take the place of Pradaxa may increase your risk of developing a clot that causes a stroke.  Refill your prescription before you run out.  After discharge, you should have regular check-up appointments with your healthcare provider that is prescribing your Pradaxa.  In the future your dose may need to be changed if your kidney function or weight changes by a significant amount.  What do you do if you miss a dose? If you miss a dose, take it as soon as you remember on the same day.  If your next dose is less than 6 hours away, skip the missed dose.  Do not take two doses of PRADAXA at the same time.  Important Safety Information A possible side effect of  Pradaxa is bleeding. You should call your healthcare provider right away if you experience any of the following:   Bleeding from an injury or your nose that does not stop.   Unusual colored urine (red or dark brown) or unusual colored stools (red or black).   Unusual bruising for unknown reasons.   A serious fall or if you hit your head (even if there is no bleeding).  Some medicines may interact with Pradaxa and might increase your risk of bleeding or clotting while on Pradaxa. To help avoid this, consult your healthcare provider or pharmacist prior to using any new prescription or non-prescription medications, including herbals, vitamins, non-steroidal anti-inflammatory drugs (NSAIDs) and supplements.  This website has more information on Pradaxa (dabigatran): https://www.pradaxa.com

## 2014-06-23 NOTE — Progress Notes (Signed)
Patient was very anxious last night  (around 2200) in regards to mouth/jaw being swollen after surgery.  At one point patient was concerned that he was having an allergic reaction to medications given during surgery.  Patient's vitals were stable and patient denied any other symptoms.  Patient was repositioned.  On call contacted.  Patient refused any new orders.  Patient's anxiety level decreased and facial flush improved.  Patient has remained stable.   This morning patient/family expressed concerns about the block used to his left arm.  Patient is able to move fingers but block still in effect.  Explained block can last up 24 hours.

## 2014-06-23 NOTE — Progress Notes (Signed)
UR Completed.  [name] [name] [id number] 06/23/2014

## 2014-06-24 ENCOUNTER — Encounter (HOSPITAL_COMMUNITY): Payer: Self-pay | Admitting: Otolaryngology

## 2014-06-24 ENCOUNTER — Encounter: Payer: Medicare PPO | Admitting: Oncology

## 2014-06-25 ENCOUNTER — Encounter (HOSPITAL_COMMUNITY): Payer: Self-pay | Admitting: Otolaryngology

## 2014-07-23 ENCOUNTER — Other Ambulatory Visit (INDEPENDENT_AMBULATORY_CARE_PROVIDER_SITE_OTHER): Payer: Medicare PPO

## 2014-07-23 DIAGNOSIS — D472 Monoclonal gammopathy: Secondary | ICD-10-CM

## 2014-07-23 DIAGNOSIS — C61 Malignant neoplasm of prostate: Secondary | ICD-10-CM

## 2014-07-23 DIAGNOSIS — N289 Disorder of kidney and ureter, unspecified: Secondary | ICD-10-CM

## 2014-07-23 LAB — CBC WITH DIFFERENTIAL/PLATELET
BASOS PCT: 0 % (ref 0–1)
Basophils Absolute: 0 10*3/uL (ref 0.0–0.1)
EOS ABS: 0.2 10*3/uL (ref 0.0–0.7)
EOS PCT: 3 % (ref 0–5)
HEMATOCRIT: 31.4 % — AB (ref 39.0–52.0)
HEMOGLOBIN: 10.5 g/dL — AB (ref 13.0–17.0)
Lymphocytes Relative: 38 % (ref 12–46)
Lymphs Abs: 2.1 10*3/uL (ref 0.7–4.0)
MCH: 32.2 pg (ref 26.0–34.0)
MCHC: 33.4 g/dL (ref 30.0–36.0)
MCV: 96.3 fL (ref 78.0–100.0)
MONO ABS: 0.6 10*3/uL (ref 0.1–1.0)
Monocytes Relative: 11 % (ref 3–12)
Neutro Abs: 2.6 10*3/uL (ref 1.7–7.7)
Neutrophils Relative %: 48 % (ref 43–77)
Platelets: 255 10*3/uL (ref 150–400)
RBC: 3.26 MIL/uL — ABNORMAL LOW (ref 4.22–5.81)
RDW: 13.9 % (ref 11.5–15.5)
WBC: 5.5 10*3/uL (ref 4.0–10.5)

## 2014-07-23 LAB — COMPLETE METABOLIC PANEL WITH GFR
ALT: 12 U/L (ref 0–53)
AST: 16 U/L (ref 0–37)
Albumin: 3.7 g/dL (ref 3.5–5.2)
Alkaline Phosphatase: 44 U/L (ref 39–117)
BILIRUBIN TOTAL: 0.7 mg/dL (ref 0.2–1.2)
BUN: 14 mg/dL (ref 6–23)
CO2: 25 meq/L (ref 19–32)
Calcium: 9.2 mg/dL (ref 8.4–10.5)
Chloride: 107 mEq/L (ref 96–112)
Creat: 1.63 mg/dL — ABNORMAL HIGH (ref 0.50–1.35)
GFR, Est African American: 43 mL/min — ABNORMAL LOW
GFR, Est Non African American: 37 mL/min — ABNORMAL LOW
Glucose, Bld: 102 mg/dL — ABNORMAL HIGH (ref 70–99)
Potassium: 4.9 mEq/L (ref 3.5–5.3)
SODIUM: 137 meq/L (ref 135–145)
TOTAL PROTEIN: 7.4 g/dL (ref 6.0–8.3)

## 2014-07-24 LAB — IGG: IgG (Immunoglobin G), Serum: 2340 mg/dL — ABNORMAL HIGH (ref 650–1600)

## 2014-07-24 LAB — PSA: PSA: 3 ng/mL (ref <=4.00)

## 2014-07-24 LAB — KAPPA/LAMBDA LIGHT CHAINS
Kappa free light chain: 3.8 mg/dL — ABNORMAL HIGH (ref 0.33–1.94)
Kappa:Lambda Ratio: 3.58 — ABNORMAL HIGH (ref 0.26–1.65)
Lambda Free Lght Chn: 1.06 mg/dL (ref 0.57–2.63)

## 2014-07-29 ENCOUNTER — Encounter: Payer: Self-pay | Admitting: Oncology

## 2014-07-29 ENCOUNTER — Ambulatory Visit (INDEPENDENT_AMBULATORY_CARE_PROVIDER_SITE_OTHER): Payer: Medicare PPO | Admitting: Oncology

## 2014-07-29 VITALS — BP 159/60 | HR 52 | Temp 99.0°F | Wt 199.5 lb

## 2014-07-29 DIAGNOSIS — E785 Hyperlipidemia, unspecified: Secondary | ICD-10-CM

## 2014-07-29 DIAGNOSIS — M479 Spondylosis, unspecified: Secondary | ICD-10-CM

## 2014-07-29 DIAGNOSIS — Z8673 Personal history of transient ischemic attack (TIA), and cerebral infarction without residual deficits: Secondary | ICD-10-CM

## 2014-07-29 DIAGNOSIS — I359 Nonrheumatic aortic valve disorder, unspecified: Secondary | ICD-10-CM

## 2014-07-29 DIAGNOSIS — K219 Gastro-esophageal reflux disease without esophagitis: Secondary | ICD-10-CM

## 2014-07-29 DIAGNOSIS — D472 Monoclonal gammopathy: Secondary | ICD-10-CM

## 2014-07-29 DIAGNOSIS — C61 Malignant neoplasm of prostate: Secondary | ICD-10-CM

## 2014-07-29 DIAGNOSIS — I2510 Atherosclerotic heart disease of native coronary artery without angina pectoris: Secondary | ICD-10-CM

## 2014-07-29 DIAGNOSIS — Z85828 Personal history of other malignant neoplasm of skin: Secondary | ICD-10-CM

## 2014-07-29 DIAGNOSIS — I4891 Unspecified atrial fibrillation: Secondary | ICD-10-CM

## 2014-07-29 NOTE — Patient Instructions (Signed)
Lab at Internal Medicine center 01/20/15 Visit with Dr Beryle Beams  January 27, 2015 at 9:15 AM

## 2014-07-31 NOTE — Progress Notes (Signed)
Patient ID: [name] [name], male   DOB: 1925/03/28, 80 y.o.   MRN: [id number] Hematology and Oncology Follow Up Visit  [name] [name] [id number] Jan 05, 1925 80 y.o. 07/31/2014 2:28 PM   Principle Diagnosis: Encounter Diagnoses  Name Primary?  . Prostate cancer   . 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 He has underlying coronary and valvular heart disease. He has chronic atrial fibrillation and is on chronic anticoagulation with Pradaxa 75 mg twice daily. He has required DC cardioversion in the past. Please see previous notes for complete details.  Since his last visit with me, he got into trouble when he lost his balance and carrying some garden soil and fell down flat on his face on a gravel surface. He sustained a LeFort I fracture of the facial bones as well as a fracture of his left wrist. He developed a massive hematoma of the soft tissues of his face. Anticoagulation was discontinued. He was monitored in the hospital until stable. He had some initial dysphagia which fortunately resolved. He had a sensation that his maxilla and mandible were not in proper alignment. He underwent further evaluation by ear nose and throat surgeon as an outpatient and in fact, facial bones were not in good alignment. He was readmitted to the hospital and underwent a simultaneous procedure on his facial bones and his wrist. He underwent open reduction with internal fixation of bilateral LeFort I fractures with placement of arch bars and open reduction and internal fixation of left metacarpal fracture. He tolerated both procedures exceedingly well and was discharged from the hospital 24 hours after surgery.  He looks 1000% better today! Facial  hematoma and swelling has resolved. He feels that the surgery has helped significantly but he is not 100% back to baseline with respect to what he feels is the proper alignment of his maxilla and mandibles. He is back on anticoagulation.     Medications: reviewed  Allergies:  Allergies  Allergen Reactions  . Guaifenesin & Derivatives Other (See Comments)    Unknown  . Penicillins Other (See Comments)    Unknown  . Chlorhexidine Rash  . Oxycodone Itching and Rash  . Oxycontin [Oxycodone Hcl] Itching and Rash    Review of Systems: Hematology:  See above ENT ROS: See above Breast ROS:  Respiratory ROS: No cough or dyspnea Cardiovascular ROS:  No ischemic type chest pain or palpitations Gastrointestinal ROS: No abdominal pain    Genito-Urinary ROS: Questioned Musculoskeletal ROS: Chronic back pain Neurological ROS: No headache or change in vision Dermatological ROS: No rash Remaining ROS negative:   Physical Exam: Blood pressure 159/60, pulse 52, temperature 99 F (37.2 C), temperature source Oral, weight 199 lb 8 oz (90.493 kg), SpO2 100.00%. Wt Readings from Last 3 Encounters:  07/29/14 199 lb 8 oz (90.493 kg)  06/19/14 195 lb 1.7 oz (88.5 kg)  06/19/14 194 lb (87.998 kg)     General appearance: Well-nourished Caucasian man HENNT: Pharynx no erythema, exudate, mass, or ulcer. No thyromegaly or thyroid nodules Lymph nodes: No cervical, supraclavicular, adenopathy persistent approximate 3 cm left axillary lymphadenopathy unchanged from prior exams. Breasts: Lungs: Clear to auscultation, resonant to percussion throughout Heart: Regular rhythm, pansystolic heart murmur loudest at the second right intercostal space Abdomen: Soft, nontender, normal bowel sounds, no mass, no organomegaly  Extremities: No edema, no calf tenderness Musculoskeletal: no joint deformities GU:  Vascular: Carotid pulses 2+, no bruits,  Neurologic: Alert, oriented, PERRLA, optic discs sharp and  vessels normal, no hemorrhage or exudate, cranial nerves grossly normal, motor strength 5 over 5, reflexes 1+ symmetric, upper body coordination normal, gait normal, Skin: No rash or ecchymosis  Lab Results: CBC W/Diff    Component Value Date/Time   WBC 5.5 07/23/2014 0941   WBC 7.4 01/14/2014 0804   RBC 3.26* 07/23/2014 0941   RBC 3.66* 01/14/2014 0804   HGB 10.5* 07/23/2014 0941   HGB 12.1* 01/14/2014 0804   HCT 31.4* 07/23/2014 0941   HCT 36.5* 01/14/2014 0804   PLT 255 07/23/2014 0941   PLT 199 01/14/2014 0804   MCV 96.3 07/23/2014 0941   MCV 99.8* 01/14/2014 0804   MCH 32.2 07/23/2014 0941   MCH 33.2 01/14/2014 0804   MCHC 33.4 07/23/2014 0941   MCHC 33.3 01/14/2014 0804   RDW 13.9 07/23/2014 0941   RDW 13.7 01/14/2014 0804   LYMPHSABS 2.1 07/23/2014 0941   LYMPHSABS 2.6 01/14/2014 0804   MONOABS 0.6 07/23/2014 0941   MONOABS 0.7 01/14/2014 0804   EOSABS 0.2 07/23/2014 0941   EOSABS 0.3 01/14/2014 0804   BASOSABS 0.0 07/23/2014 0941   BASOSABS 0.0 01/14/2014 0804     Chemistry      Component Value Date/Time   NA 137 07/23/2014 0941   NA 139 01/14/2014 0804   K 4.9 07/23/2014 0941   K 4.8 01/14/2014 0804   CL 107 07/23/2014 0941   CL 107 03/19/2013 0949   CO2 25 07/23/2014 0941   CO2 25 01/14/2014 0804   BUN 14 07/23/2014 0941   BUN 16.7 01/14/2014 0804   CREATININE 1.63* 07/23/2014 0941   CREATININE 1.90* 06/19/2014 1456   CREATININE 1.7* 01/14/2014 0804   CREATININE 1.59* 09/27/2013 0848      Component Value Date/Time   CALCIUM 9.2 07/23/2014 0941   CALCIUM 9.4 01/14/2014 0804   ALKPHOS 44 07/23/2014 0941   ALKPHOS 45 01/14/2014 0804   AST 16 07/23/2014 0941   AST 21 01/14/2014 0804   ALT 12 07/23/2014 0941   ALT 20 01/14/2014 0804   BILITOT 0.7 07/23/2014 0941   BILITOT 0.71 01/14/2014 0804     IgG 2.3 g compared with 2.1 g in March and 2.7 g in November 2014 Serum free light chains: Ratio 3.8 compared with 3.5 in November 2014  PSA 3.0 on 07/23/2014 compared with 2.55 on 09/17/2013 with previous values as high as 3.07 in November  2013   Impression:   #1. IgG kappa monoclonal gammopathy of undetermined significance. Initial diagnosis July 2000.  Total IgG, and free light chains remain stable. Hemoglobin decreased from baseline secondary to  recent trauma and surgery Plan: Continue every 6 month laboratory monitoring.   #2. Localized prostate cancer treated with radiofrequency ablation at time of diagnosis in February of 2000. Stable PSA with minor fluctuations.   #3. History of  2 isolated  episodes of self-limited gross hematuria  This may be a result of anticoagulation with Pradaxa. No recent recurrences.  #4. Single vessel coronary artery disease.   #5. Aortic systolic murmur with mild aortic stenosis and regurgitation on echocardiogram.   #6. Atrial fibrillation diagnosed 4 years ago. Currently stable on Multaq(Dronedarone). 400 mg in the morning 400 mg in the evening. Previous relapse after DC cardioversion x 2. Recent repeat cardioversion 1/15 with addition of low-dose beta blocker metoprolol, 25 mg twice  a day, to his medical regimen. Currently on anticoagulation with Pradaxa 75 mg twice a day.   #7. GERD   #8. Hyperlipidemia   #9. Status post excision multiple basal cell carcinomas   #10. Degenerative arthritis of the spine.   #11. Remote history of a transient ischemic attack.   #12. Orthopedic problems with right shoulder for arthroscopy and rotator cuff repair this week 01/23/2014.  #13. Recent bilateral traumatic LeFort I fractures of the maxillary bones and fracture of the left wrist metacarpal bone requiring surgery at both sites. He has fully recovered.   CC: Patient Care Team: Lennon Alstrom, MD (Neurology) Cleotis Nipper, MD (Gastroenterology) Ailene Rud, MD (Urology) Carlena Bjornstad, MD as Consulting Physician (Cardiology)   Annia Belt, MD 9/16/20152:28 PM

## 2014-08-16 ENCOUNTER — Other Ambulatory Visit: Payer: Self-pay | Admitting: *Deleted

## 2014-08-16 DIAGNOSIS — I340 Nonrheumatic mitral (valve) insufficiency: Secondary | ICD-10-CM

## 2014-08-16 DIAGNOSIS — I350 Nonrheumatic aortic (valve) stenosis: Secondary | ICD-10-CM

## 2014-08-16 DIAGNOSIS — D472 Monoclonal gammopathy: Secondary | ICD-10-CM

## 2014-08-16 DIAGNOSIS — Z79899 Other long term (current) drug therapy: Secondary | ICD-10-CM

## 2014-08-16 DIAGNOSIS — R001 Bradycardia, unspecified: Secondary | ICD-10-CM

## 2014-08-16 DIAGNOSIS — C61 Malignant neoplasm of prostate: Secondary | ICD-10-CM

## 2014-08-16 MED ORDER — METOPROLOL TARTRATE 25 MG PO TABS: 25.0000 mg | ORAL_TABLET | Freq: Two times a day (BID) | ORAL | Status: DC

## 2014-08-30 ENCOUNTER — Other Ambulatory Visit: Payer: Self-pay

## 2014-10-14 ENCOUNTER — Other Ambulatory Visit: Payer: Self-pay | Admitting: *Deleted

## 2014-10-14 MED ORDER — DRONEDARONE HCL 400 MG PO TABS: 400.0000 mg | ORAL_TABLET | Freq: Two times a day (BID) | ORAL | Status: DC

## 2014-11-25 ENCOUNTER — Ambulatory Visit (INDEPENDENT_AMBULATORY_CARE_PROVIDER_SITE_OTHER): Payer: Medicare PPO | Admitting: Cardiology

## 2014-11-25 ENCOUNTER — Encounter: Payer: Self-pay | Admitting: Cardiology

## 2014-11-25 VITALS — BP 120/70 | HR 88 | Ht 67.0 in | Wt 192.0 lb

## 2014-11-25 DIAGNOSIS — I480 Paroxysmal atrial fibrillation: Secondary | ICD-10-CM

## 2014-11-25 DIAGNOSIS — R001 Bradycardia, unspecified: Secondary | ICD-10-CM

## 2014-11-25 DIAGNOSIS — I351 Nonrheumatic aortic (valve) insufficiency: Secondary | ICD-10-CM

## 2014-11-25 DIAGNOSIS — I350 Nonrheumatic aortic (valve) stenosis: Secondary | ICD-10-CM

## 2014-11-25 LAB — CBC WITH DIFFERENTIAL/PLATELET
BASOS ABS: 0 10*3/uL (ref 0.0–0.1)
Basophils Relative: 0.3 % (ref 0.0–3.0)
Eosinophils Absolute: 0.3 10*3/uL (ref 0.0–0.7)
Eosinophils Relative: 3.6 % (ref 0.0–5.0)
HCT: 36.3 % — ABNORMAL LOW (ref 39.0–52.0)
Hemoglobin: 11.7 g/dL — ABNORMAL LOW (ref 13.0–17.0)
Lymphocytes Relative: 38.4 % (ref 12.0–46.0)
Lymphs Abs: 3.5 10*3/uL (ref 0.7–4.0)
MCHC: 32.2 g/dL (ref 30.0–36.0)
MCV: 98.2 fl (ref 78.0–100.0)
Monocytes Absolute: 0.8 10*3/uL (ref 0.1–1.0)
Monocytes Relative: 8.8 % (ref 3.0–12.0)
NEUTROS PCT: 48.9 % (ref 43.0–77.0)
Neutro Abs: 4.5 10*3/uL (ref 1.4–7.7)
Platelets: 250 10*3/uL (ref 150.0–400.0)
RBC: 3.7 Mil/uL — ABNORMAL LOW (ref 4.22–5.81)
RDW: 17.2 % — AB (ref 11.5–15.5)
WBC: 9.1 10*3/uL (ref 4.0–10.5)

## 2014-11-25 LAB — BASIC METABOLIC PANEL
BUN: 21 mg/dL (ref 6–23)
CHLORIDE: 108 meq/L (ref 96–112)
CO2: 26 meq/L (ref 19–32)
Calcium: 8.9 mg/dL (ref 8.4–10.5)
Creatinine, Ser: 1.7 mg/dL — ABNORMAL HIGH (ref 0.4–1.5)
GFR: 40.4 mL/min — AB (ref >60.00)
GLUCOSE: 96 mg/dL (ref 70–99)
Potassium: 4.8 mEq/L (ref 3.5–5.1)
Sodium: 138 mEq/L (ref 135–145)

## 2014-11-25 NOTE — Assessment & Plan Note (Signed)
The patient has return of atrial fibrillation. The last documented atrial fibrillation was January, 2015. He was cardioverted with one shock then and did quite well. We will proceed with cardioversion on November 27, 2014. History of clay he has met with Dr. Rayann Heman. It is felt that atrial fibrillation would not be a good approach. If he converts and seems to feel better but does not hold, we will consider Tikosyn or amiodarone. However the first attempt will be made on his current medications. He is anticoagulated.

## 2014-11-25 NOTE — Assessment & Plan Note (Signed)
He has recovered very nicely from this.  As part of today's evaluation I spent greater than 25 minutes on his total care. More than half of this time is been with direct contact with him to review the whole history and decide once again if we should proceed with cardioversion.

## 2014-11-25 NOTE — Assessment & Plan Note (Signed)
We know that he has mild aortic insufficiency. No further workup now.

## 2014-11-25 NOTE — Progress Notes (Signed)
Patient ID: [name] [name], male   DOB: 03-08-1925, 81 y.o.   MRN: [id number]    HPI The patient called today to say that he thought he was back in atrial fibrillation. I had them to the schedule and he is now here and stable. There is a long history of paroxysmal atrial fibrillation. We have cardioverted him a few times over the years. The last time was one year ago. In the meantime he fell in the past and had major maxillofacial surgery. This was done successfully. He actually had sinus rhythm during that time. More recently he has felt fatigue. He noted that he is back in atrial fibrillation. It is not clear how long he has been back in it. He is anticoagulated. He's not having any chest pain.  Allergies  Allergen Reactions  . Guaifenesin & Derivatives Other (See Comments)    Unknown  . Penicillins Other (See Comments)    Unknown  . Chlorhexidine Rash  . Oxycodone Itching and Rash  . Oxycontin [Oxycodone Hcl] Itching and Rash    Current Outpatient Prescriptions  Medication Sig Dispense Refill  . aspirin 81 MG tablet Take 81 mg by mouth 3 (three) times a week. Monday Wednesday and Friday.    Marland Kitchen atorvastatin (LIPITOR) 10 MG tablet Take 1 tablet (10 mg total) by mouth daily. 30 tablet 9  . dabigatran (PRADAXA) 75 MG CAPS capsule Take 1 capsule (75 mg total) by mouth every 12 (twelve) hours. 60 capsule 5  . dronedarone (MULTAQ) 400 MG tablet Take 1 tablet (400 mg total) by mouth 2 (two) times daily with a meal. 60 tablet 5  . metoprolol tartrate (LOPRESSOR) 25 MG tablet Take 1 tablet (25 mg total) by mouth 2 (two) times daily. 180 tablet 3  . naproxen sodium (ANAPROX) 220 MG tablet Take 220-440 mg by mouth daily as needed (for pain).    Marland Kitchen omeprazole (PRILOSEC) 20 MG capsule Take 20 mg by mouth See admin instructions. Take 1 tablet Monday, Wednesday, Friday, Saturday    . Propylene Glycol (SYSTANE BALANCE) 0.6 % SOLN Apply 1-2 drops to eye every 6 (six) hours as needed (for  dryness/irritation).     No current facility-administered medications for this visit.    History   Social History  . Marital Status: Widowed    Spouse Name: N/A    Number of Children: N/A  . Years of Education: N/A   Occupational History  . Not on file.   Social History Main Topics  . Smoking status: Former Research scientist (life sciences)  . Smokeless tobacco: Never Used  . Alcohol Use: 1.2 oz/week    1 Glasses of wine, 1 Shots of liquor per week     Comment: Daily.  . Drug Use: No  . Sexual Activity: Not on file   Other Topics Concern  . Not on file   Social History Narrative    History reviewed. No pertinent family history.  Past Medical History  Diagnosis Date  . Coronary artery disease     minimal, cath 2005 / catheterization August, 2011, 30% in 3 vessels, normal LV function  . Hyperlipidemia   . Atrial fibrillation 2011, 2015    Multaq Started September, 2011, dose adjusted October, 2011, 200 mg a.m., 400 mg p.o.  . Monoclonal gammopathy     granfortuna  . TIA (transient ischemic attack)     Dr. Erling Cruz,, question in the past. when off ASA for 10 days  . GERD (gastroesophageal reflux disease)     Esophageal  dilatation in 1992, some esophageal spasm  . S/P transurethral resection of prostate 2004    2004  . MR (mitral regurgitation) 2009    mild, Echo, July, 2011  . Cervical spine disease   . Ejection fraction     EF 60%, echo, 2009  /  TEE normal August, 2011 /   EF 60%, echo, July, 2011  . Drug therapy     Pradaxa Started July, 2011  . Aortic stenosis     Mild, echo, July, 2011  . Aortic insufficiency     Mild, echo, July, 2011  . Carotid artery disease     Doppler, 2008 no significant abnormality  . Diarrhea     Chronic  . Incomplete right bundle branch block     August, 2012  . Prostate cancer 11/22/2011  . Painless hematuria 03/27/2012    Occurred x 2 5/12 & 5/13 on Pradaxa 75 mg BID  . Bradycardia     Sinus bradycardia while on 25 Lopressor twice a day October, 2013    . Memory change     June, 2014  . Leg fatigue     June, 2014  . Anemia   . Renal insufficiency     Prior creatinine 1.2 /  September, 2011.. 1.7  /  October, 2011.. 1.8    Past Surgical History  Procedure Laterality Date  . History of turp    . Hernia repair    . Cataract extraction    . Fibular fracture repair    . Cardioversion  08/01/2012    Procedure: CARDIOVERSION;  Surgeon: Carlena Bjornstad, MD;  Location: [facility] ENDOSCOPY;  Service: Cardiovascular;  Laterality: N/A;  . Cardioversion N/A 11/29/2013    Procedure: CARDIOVERSION;  Surgeon: Carlena Bjornstad, MD;  Location: [facility] ENDOSCOPY;  Service: Cardiovascular;  Laterality: N/A;  . Colonoscopy    . Cardiac catheterization    . Eye surgery Bilateral     cataract extraction  . Shoulder arthroscopy with rotator cuff repair Right 01/23/2014    DR Noemi Chapel   . Shoulder arthroscopy with rotator cuff repair Right 01/23/2014    Procedure: RIGHT SHOULDER ARTHROSCOPY WITH DEBRIDEMENT AND ROTATOR CUFF REPAIR;  Surgeon: Lorn Junes, MD;  Location: [place];  Service: Orthopedics;  Laterality: Right;  . Orif orbital fracture Bilateral 06/22/2014    Procedure: OPEN REDUCTION INTERNAL FIXATION (ORIF) BILATERAL LEFORTE1 FRACTURE OF MAXILLA, HYBRID ARCH BARS ;  Surgeon: Izora Gala, MD;  Location: [place];  Service: ENT;  Laterality: Bilateral;  . Open reduction internal fixation (orif) metacarpal Left 06/22/2014    Procedure: OPEN REDUCTION INTERNAL FIXATION (ORIF) LEFT HAND METACARPAL;  Surgeon: Roseanne Kaufman, MD;  Location: [place];  Service: Orthopedics;  Laterality: Left;    Patient Active Problem List   Diagnosis Date Noted  . Incomplete right bundle branch block     Priority: High  . Atrial fibrillation     Priority: High  . Coronary artery disease     Priority: High  . MR (mitral regurgitation)     Priority: High  . Drug therapy     Priority: High  . Aortic stenosis     Priority: High  . Aortic insufficiency     Priority: High  . LeFort I  fracture of maxilla 06/22/2014  . Preop cardiovascular exam 06/19/2014  . Facial fracture due to fall 06/07/2014  . Rotator cuff tear, right 01/23/2014  . GERD (gastroesophageal reflux disease)   . Skin lesion of back 12/24/2013  .  Memory change   . Leg fatigue   . Bradycardia   . Carotid artery disease   . Painless hematuria 03/27/2012  . Prostate cancer 11/22/2011  . Hyperlipidemia   . Monoclonal gammopathy   . TIA (transient ischemic attack)   . S/P transurethral resection of prostate   . Cervical spine disease   . Renal insufficiency   . Ejection fraction   . DIARRHEA, CHRONIC 10/14/2010    ROS  Patient denies fever, chills, headache, sweats, rash, change in vision, change in hearing, chest pain, cough, nausea or vomiting, urinary symptoms. All other systems are reviewed and are negative.  PHYSICAL EXAM Patient is oriented to person time and place. Affect is normal. 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 He fully understands all the medical aspects of his care. Head is atraumatic. Sclerae and conjunctiva are normal. The surgery that he has had to his maxillofacial area are completely healed. There is no jugular venous distention. Lungs are clear. Respiratory effort is not labored. Cardiac exam reveals S1 and S2. There is a systolic murmur consistent with his mild aortic valve disease. The rhythm is irregularly irregular. The rate is controlled. Abdomen is soft. There is trace peripheral edema in his right ankle. There are no obvious musculoskeletal deformities. There are no skin rashes. Neurologic is grossly intact.  Filed Vitals:   11/25/14 1310  BP: 120/70  Pulse: 88  Height: 5\' 7"  (1.702 m)  Weight: 192 lb (87.091 kg)   EKG is done today and reviewed by me. He has old right bundle branch block. The rhythm is atrial fibrillation with a controlled rate.  ASSESSMENT & PLAN

## 2014-11-25 NOTE — H&P (Signed)
11/25/2014 1:15 PM  Office Visit  MRN:  [id number]   Description: Male DOB: 20-Nov-1924  Provider: Carlena Bjornstad, MD  Department: [facility] [facility]       Vital Signs  Most recent update: 11/25/2014 1:17 PM by Milderd Meager, CMA    BP Pulse Ht Wt BMI    120/70 mmHg 88 _0  (1.702 m) 192 lb (87.091 kg) 30.06 kg/m2    Vitals History     Progress Notes      Carlena Bjornstad, MD at 11/25/2014 1:29 PM     Status: Sign at close encounter       Expand All Collapse All   Patient ID: [name] [name], male DOB: 05/06/1925, 81 y.o. MRN: [id number]    HPI The patient called today to say that he thought he was back in atrial fibrillation. I had them to the schedule and he is now here and stable. There is a long history of paroxysmal atrial fibrillation. We have cardioverted him a few times over the years. The last time was one year ago. In the meantime he fell in the past and had major maxillofacial surgery. This was done successfully. He actually had sinus rhythm during that time. More recently he has felt fatigue. He noted that he is back in atrial fibrillation. It is not clear how long he has been back in it. He is anticoagulated. He's not having any chest pain.  Allergies  Allergen Reactions  . Guaifenesin & Derivatives Other (See Comments)    Unknown  . Penicillins Other (See Comments)    Unknown  . Chlorhexidine Rash  . Oxycodone Itching and Rash  . Oxycontin [Oxycodone Hcl] Itching and Rash    Current Outpatient Prescriptions  Medication Sig Dispense Refill  . aspirin 81 MG tablet Take 81 mg by mouth 3 (three) times a week. Monday Wednesday and Friday.    Marland Kitchen atorvastatin (LIPITOR) 10 MG tablet Take 1 tablet (10 mg total) by mouth daily. 30 tablet 9  . dabigatran (PRADAXA) 75 MG CAPS capsule Take 1 capsule (75 mg total) by mouth every 12 (twelve) hours. 60 capsule 5  . dronedarone (MULTAQ) 400 MG tablet Take 1  tablet (400 mg total) by mouth 2 (two) times daily with a meal. 60 tablet 5  . metoprolol tartrate (LOPRESSOR) 25 MG tablet Take 1 tablet (25 mg total) by mouth 2 (two) times daily. 180 tablet 3  . naproxen sodium (ANAPROX) 220 MG tablet Take 220-440 mg by mouth daily as needed (for pain).    Marland Kitchen omeprazole (PRILOSEC) 20 MG capsule Take 20 mg by mouth See admin instructions. Take 1 tablet Monday, Wednesday, Friday, Saturday    . Propylene Glycol (SYSTANE BALANCE) 0.6 % SOLN Apply 1-2 drops to eye every 6 (six) hours as needed (for dryness/irritation).     No current facility-administered medications for this visit.    History   Social History  . Marital Status: Widowed    Spouse Name: N/A    Number of Children: N/A  . Years of Education: N/A   Occupational History  . Not on file.   Social History Main Topics  . Smoking status: Former Research scientist (life sciences)  . Smokeless tobacco: Never Used  . Alcohol Use: 1.2 oz/week    1 Glasses of wine, 1 Shots of liquor per week     Comment: Daily.  . Drug Use: No  . Sexual Activity: Not on file   Other Topics Concern  . Not on  file   Social History Narrative    History reviewed. No pertinent family history.  Past Medical History  Diagnosis Date  . Coronary artery disease     minimal, cath 2005 / catheterization August, 2011, 30% in 3 vessels, normal LV function  . Hyperlipidemia   . Atrial fibrillation 2011, 2015    Multaq Started September, 2011, dose adjusted October, 2011, 200 mg a.m., 400 mg p.o.  . Monoclonal gammopathy     granfortuna  . TIA (transient ischemic attack)     Dr. Erling Cruz,, question in the past. when off ASA for 10 days  . GERD (gastroesophageal reflux disease)     Esophageal dilatation in 1992, some esophageal spasm  . S/P transurethral resection of prostate 2004    2004  . MR (mitral regurgitation)  2009    mild, Echo, July, 2011  . Cervical spine disease   . Ejection fraction     EF 60%, echo, 2009 / TEE normal August, 2011 / EF 60%, echo, July, 2011  . Drug therapy     Pradaxa Started July, 2011  . Aortic stenosis     Mild, echo, July, 2011  . Aortic insufficiency     Mild, echo, July, 2011  . Carotid artery disease     Doppler, 2008 no significant abnormality  . Diarrhea     Chronic  . Incomplete right bundle branch block     August, 2012  . Prostate cancer 11/22/2011  . Painless hematuria 03/27/2012    Occurred x 2 5/12 & 5/13 on Pradaxa 75 mg BID  . Bradycardia     Sinus bradycardia while on 25 Lopressor twice a day October, 2013  . Memory change     June, 2014  . Leg fatigue     June, 2014  . Anemia   . Renal insufficiency     Prior creatinine 1.2 / September, 2011.. 1.7 / October, 2011.. 1.8    Past Surgical History  Procedure Laterality Date  . History of turp    . Hernia repair    . Cataract extraction    . Fibular fracture repair    . Cardioversion  08/01/2012    Procedure: CARDIOVERSION; Surgeon: Carlena Bjornstad, MD; Location: [facility] ENDOSCOPY; Service: Cardiovascular; Laterality: N/A;  . Cardioversion N/A 11/29/2013    Procedure: CARDIOVERSION; Surgeon: Carlena Bjornstad, MD; Location: [facility] ENDOSCOPY; Service: Cardiovascular; Laterality: N/A;  . Colonoscopy    . Cardiac catheterization    . Eye surgery Bilateral     cataract extraction  . Shoulder arthroscopy with rotator cuff repair Right 01/23/2014    DR Noemi Chapel   . Shoulder arthroscopy with rotator cuff repair Right 01/23/2014    Procedure: RIGHT SHOULDER ARTHROSCOPY WITH DEBRIDEMENT AND ROTATOR CUFF REPAIR; Surgeon: Lorn Junes, MD; Location: [place]; Service: Orthopedics; Laterality: Right;  . Orif orbital fracture Bilateral 06/22/2014     Procedure: OPEN REDUCTION INTERNAL FIXATION (ORIF) BILATERAL LEFORTE1 FRACTURE OF MAXILLA, HYBRID ARCH BARS ; Surgeon: Izora Gala, MD; Location: [place]; Service: ENT; Laterality: Bilateral;  . Open reduction internal fixation (orif) metacarpal Left 06/22/2014    Procedure: OPEN REDUCTION INTERNAL FIXATION (ORIF) LEFT HAND METACARPAL; Surgeon: Roseanne Kaufman, MD; Location: [place]; Service: Orthopedics; Laterality: Left;    Patient Active Problem List   Diagnosis Date Noted  . Incomplete right bundle branch block     Priority: High  . Atrial fibrillation     Priority: High  . Coronary artery disease     Priority: High  .  MR (mitral regurgitation)     Priority: High  . Drug therapy     Priority: High  . Aortic stenosis     Priority: High  . Aortic insufficiency     Priority: High  . LeFort I fracture of maxilla 06/22/2014  . Preop cardiovascular exam 06/19/2014  . Facial fracture due to fall 06/07/2014  . Rotator cuff tear, right 01/23/2014  . GERD (gastroesophageal reflux disease)   . Skin lesion of back 12/24/2013  . Memory change   . Leg fatigue   . Bradycardia   . Carotid artery disease   . Painless hematuria 03/27/2012  . Prostate cancer 11/22/2011  . Hyperlipidemia   . Monoclonal gammopathy   . TIA (transient ischemic attack)   . S/P transurethral resection of prostate   . Cervical spine disease   . Renal insufficiency   . Ejection fraction   . DIARRHEA, CHRONIC 10/14/2010    ROS  Patient denies fever, chills, headache, sweats, rash, change in vision, change in hearing, chest pain, cough, nausea or vomiting, urinary symptoms. All other systems are reviewed and are negative.  PHYSICAL EXAM Patient is oriented to person time and place. Affect is normal. 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 He fully understands all the medical  aspects of his care. Head is atraumatic. Sclerae and conjunctiva are normal. The surgery that he has had to his maxillofacial area are completely healed. There is no jugular venous distention. Lungs are clear. Respiratory effort is not labored. Cardiac exam reveals S1 and S2. There is a systolic murmur consistent with his mild aortic valve disease. The rhythm is irregularly irregular. The rate is controlled. Abdomen is soft. There is trace peripheral edema in his right ankle. There are no obvious musculoskeletal deformities. There are no skin rashes. Neurologic is grossly intact.  Filed Vitals:   11/25/14 1310  BP: 120/70  Pulse: 88  Height: _0  (1.702 m)  Weight: 192 lb (87.091 kg)   EKG is done today and reviewed by me. He has old right bundle branch block. The rhythm is atrial fibrillation with a controlled rate.  ASSESSMENT & PLAN              Aortic insufficiency - Carlena Bjornstad, MD at 11/25/2014 1:33 PM     Status: Written Related Problem: Aortic insufficiency   Expand All Collapse All   We know that he has mild aortic insufficiency. No further workup now.            Aortic stenosis - Carlena Bjornstad, MD at 11/25/2014 1:35 PM     Status: Written Related Problem: Aortic stenosis   Expand All Collapse All   We know that he has mild aortic stenosis. He does not need any further tests at this time.            Atrial fibrillation - Carlena Bjornstad, MD at 11/25/2014 1:37 PM     Status: Written Related Problem: Atrial fibrillation   Expand All Collapse All   The patient has return of atrial fibrillation. The last documented atrial fibrillation was January, 2015. He was cardioverted with one shock then and did quite well. We will proceed with cardioversion on November 27, 2014. History of clay he has met with Dr. Rayann Heman. It is felt that atrial fibrillation would not be a good approach. If he converts and seems to feel better but does not hold, we  will consider Tikosyn or amiodarone. However the first attempt  will be made on his current medications. He is anticoagulated.             Bradycardia - Carlena Bjornstad, MD at 11/25/2014 1:37 PM     Status: Written Related Problem: Bradycardia   Expand All Collapse All   He's had some sinus bradycardia while on higher doses of metoprolol. He continues on a low dose at this time. I've chosen to hold his morning dose of metoprolol on the day of the cardioversion.            Facial fracture due to fall - Carlena Bjornstad, MD at 11/25/2014 1:38 PM     Status: Written Related Problem: Facial fracture due to fall   Expand All Collapse All   He has recovered very nicely from this.  As part of today's evaluation I spent greater than 25 minutes on his total care. More than half of this time is been with direct contact with him to review the whole history and decide once again if we should proceed with cardioversion       Ron Parker

## 2014-11-25 NOTE — Assessment & Plan Note (Signed)
He's had some sinus bradycardia while on higher doses of metoprolol. He continues on a low dose at this time. I've chosen to hold his morning dose of metoprolol on the day of the cardioversion.

## 2014-11-25 NOTE — Patient Instructions (Signed)
Your physician recommends that you continue on your current medications as directed. Please refer to the Current Medication list given to you today.  Your physician recommends that you return for lab work in: today  Your physician has recommended that you have a Cardioversion (DCCV). Electrical Cardioversion uses a jolt of electricity to your heart either through paddles or wired patches attached to your chest. This is a controlled, usually prescheduled, procedure. Defibrillation is done under light anesthesia in the hospital, and you usually go home the day of the procedure. This is done to get your heart back into a normal rhythm. You are not awake for the procedure. Please see the instruction sheet given to you today.  Your physician recommends that you schedule a follow-up appointment in: after Cardioversion

## 2014-11-25 NOTE — Assessment & Plan Note (Signed)
We know that he has mild aortic stenosis. He does not need any further tests at this time.

## 2014-11-26 MED ORDER — SODIUM CHLORIDE 0.9 % IV SOLN
INTRAVENOUS | Status: DC
  Administered 2014-11-27: 500 mL via INTRAVENOUS

## 2014-11-27 ENCOUNTER — Encounter (HOSPITAL_COMMUNITY)
Admission: RE | Disposition: A | Discharge status: Home / Self Care | Payer: Self-pay | Source: Ambulatory Visit | Attending: Cardiology

## 2014-11-27 ENCOUNTER — Ambulatory Visit (HOSPITAL_COMMUNITY)
Admission: RE | Admit: 2014-11-27 | Discharge: 2014-11-27 | Disposition: A | Discharge status: Home / Self Care | Payer: Medicare PPO | Source: Ambulatory Visit | Attending: Cardiology | Admitting: Cardiology

## 2014-11-27 ENCOUNTER — Ambulatory Visit (HOSPITAL_COMMUNITY): Payer: Medicare PPO | Admitting: Critical Care Medicine

## 2014-11-27 ENCOUNTER — Encounter (HOSPITAL_COMMUNITY): Payer: Self-pay | Admitting: Critical Care Medicine

## 2014-11-27 DIAGNOSIS — I2510 Atherosclerotic heart disease of native coronary artery without angina pectoris: Secondary | ICD-10-CM | POA: Diagnosis not present

## 2014-11-27 DIAGNOSIS — K219 Gastro-esophageal reflux disease without esophagitis: Secondary | ICD-10-CM | POA: Insufficient documentation

## 2014-11-27 DIAGNOSIS — Z8673 Personal history of transient ischemic attack (TIA), and cerebral infarction without residual deficits: Secondary | ICD-10-CM | POA: Insufficient documentation

## 2014-11-27 DIAGNOSIS — E785 Hyperlipidemia, unspecified: Secondary | ICD-10-CM | POA: Diagnosis not present

## 2014-11-27 DIAGNOSIS — I352 Nonrheumatic aortic (valve) stenosis with insufficiency: Secondary | ICD-10-CM | POA: Diagnosis not present

## 2014-11-27 DIAGNOSIS — Z87891 Personal history of nicotine dependence: Secondary | ICD-10-CM | POA: Insufficient documentation

## 2014-11-27 DIAGNOSIS — Z8546 Personal history of malignant neoplasm of prostate: Secondary | ICD-10-CM | POA: Insufficient documentation

## 2014-11-27 DIAGNOSIS — I4891 Unspecified atrial fibrillation: Secondary | ICD-10-CM | POA: Insufficient documentation

## 2014-11-27 DIAGNOSIS — Z7982 Long term (current) use of aspirin: Secondary | ICD-10-CM | POA: Diagnosis not present

## 2014-11-27 DIAGNOSIS — I480 Paroxysmal atrial fibrillation: Secondary | ICD-10-CM

## 2014-11-27 HISTORY — PX: CARDIOVERSION: SHX1299

## 2014-11-27 SURGERY — CARDIOVERSION
Anesthesia: General

## 2014-11-27 MED ORDER — LIDOCAINE HCL (CARDIAC) 20 MG/ML IV SOLN
INTRAVENOUS | Status: DC | PRN
  Administered 2014-11-27: 60 mg via INTRAVENOUS

## 2014-11-27 MED ORDER — PROPOFOL 10 MG/ML IV BOLUS
INTRAVENOUS | Status: DC | PRN
  Administered 2014-11-27: 70 mg via INTRAVENOUS

## 2014-11-27 NOTE — Anesthesia Preprocedure Evaluation (Addendum)
Anesthesia Evaluation  Patient identified by MRN, date of birth, ID band Patient awake    Reviewed: Allergy & Precautions, NPO status , Patient's Chart, lab work & pertinent test results  History of Anesthesia Complications Negative for: history of anesthetic complications  Airway Mallampati: II  TM Distance: >3 FB Neck ROM: Full    Dental  (+) Teeth Intact, Dental Advisory Given   Pulmonary former smoker,          Cardiovascular + CAD and + Peripheral Vascular Disease + dysrhythmias  EF 60%, echo, 2009 / TEE normal August, 2011 /  EF 60%, echo, July, 2011   Neuro/Psych TIAnegative psych ROS   GI/Hepatic Neg liver ROS, GERD-  ,  Endo/Other    Renal/GU Renal disease     Musculoskeletal   Abdominal   Peds  Hematology   Anesthesia Other Findings   Reproductive/Obstetrics                            Anesthesia Physical Anesthesia Plan  ASA: III  Anesthesia Plan: General   Post-op Pain Management:    Induction: Intravenous  Airway Management Planned: Mask  Additional Equipment:   Intra-op Plan:   Post-operative Plan:   Informed Consent:   Dental advisory given  Plan Discussed with:   Anesthesia Plan Comments:         Anesthesia Quick Evaluation

## 2014-11-27 NOTE — Anesthesia Postprocedure Evaluation (Signed)
  Anesthesia Post-op Note  Patient: [name] [name]  Procedure(s) Performed: Procedure(s): CARDIOVERSION (N/A)  Patient Location: Endoscopy Unit  Anesthesia Type:MAC  Level of Consciousness: awake, alert  and oriented  Airway and Oxygen Therapy: Patient Spontanous Breathing  Post-op Pain: none  Post-op Assessment: Post-op Vital signs reviewed, Patient's Cardiovascular Status Stable, Respiratory Function Stable and Patent Airway  Post-op Vital Signs: Reviewed and stable  Last Vitals:  Filed Vitals:   11/27/14 1118  BP: 168/128  Temp: 28.1 C    Complications: No apparent anesthesia complications

## 2014-11-27 NOTE — Anesthesia Procedure Notes (Signed)
Date/Time: 11/27/2014 11:47 AM Performed by: Carola Frost Pre-anesthesia Checklist: Patient identified, Emergency Drugs available, Suction available, Timeout performed and Patient being monitored Patient Re-evaluated:Patient Re-evaluated prior to inductionOxygen Delivery Method: Ambu bag Preoxygenation: Pre-oxygenation with 100% oxygen Intubation Type: IV induction Ventilation: Mask ventilation without difficulty Placement Confirmation: breath sounds checked- equal and bilateral Dental Injury: Teeth and Oropharynx as per pre-operative assessment

## 2014-11-27 NOTE — Transfer of Care (Signed)
Immediate Anesthesia Transfer of Care Note  Patient: [name] [name]  Procedure(s) Performed: Procedure(s): CARDIOVERSION (N/A)  Patient Location: Endoscopy Unit  Anesthesia Type:MAC  Level of Consciousness: awake, alert  and oriented  Airway & Oxygen Therapy: Patient Spontanous Breathing and Patient connected to nasal cannula oxygen  Post-op Assessment: Report given to PACU RN, Post -op Vital signs reviewed and stable and Patient moving all extremities X 4  Post vital signs: Reviewed and stable  Complications: No apparent anesthesia complications

## 2014-11-27 NOTE — CV Procedure (Signed)
Anesthesia present. Permit signed.   Pt received 60mg  lidocaine and 75mg  Propofol.  Anterior/posteriior pads with biphasic defibrillator.  Shocked with 120joules. Converted to NSR.   Successful cardioversion. One shock.   Daryel November, MD

## 2014-11-27 NOTE — H&P (Signed)
HPI The patient called today to say that he thought he was back in atrial fibrillation. I had them to the schedule and he is now here and stable. There is a long history of paroxysmal atrial fibrillation. We have cardioverted him a few times over the years. The last time was one year ago. In the meantime he fell in the past and had major maxillofacial surgery. This was done successfully. He actually had sinus rhythm during that time. More recently he has felt fatigue. He noted that he is back in atrial fibrillation. It is not clear how long he has been back in it. He is anticoagulated. He's not having any chest pain.  Allergies  Allergen Reactions  . Guaifenesin & Derivatives Other (See Comments)    Unknown  . Penicillins Other (See Comments)    Unknown  . Chlorhexidine Rash  . Oxycodone Itching and Rash  . Oxycontin [Oxycodone Hcl] Itching and Rash    Current Outpatient Prescriptions  Medication Sig Dispense Refill  . aspirin 81 MG tablet Take 81 mg by mouth 3 (three) times a week. Monday Wednesday and Friday.    Marland Kitchen atorvastatin (LIPITOR) 10 MG tablet Take 1 tablet (10 mg total) by mouth daily. 30 tablet 9  . dabigatran (PRADAXA) 75 MG CAPS capsule Take 1 capsule (75 mg total) by mouth every 12 (twelve) hours. 60 capsule 5  . dronedarone (MULTAQ) 400 MG tablet Take 1 tablet (400 mg total) by mouth 2 (two) times daily with a meal. 60 tablet 5  . metoprolol tartrate (LOPRESSOR) 25 MG tablet Take 1 tablet (25 mg total) by mouth 2 (two) times daily. 180 tablet 3  . naproxen sodium (ANAPROX) 220 MG tablet Take 220-440 mg by mouth daily as needed (for pain).    Marland Kitchen omeprazole (PRILOSEC) 20 MG capsule Take 20 mg by mouth See admin instructions. Take 1 tablet Monday, Wednesday, Friday, Saturday    . Propylene Glycol (SYSTANE BALANCE) 0.6 % SOLN Apply 1-2 drops to eye every 6 (six) hours as needed (for dryness/irritation).      No current facility-administered medications for this visit.    History   Social History  . Marital Status: Widowed    Spouse Name: N/A    Number of Children: N/A  . Years of Education: N/A   Occupational History  . Not on file.   Social History Main Topics  . Smoking status: Former Research scientist (life sciences)  . Smokeless tobacco: Never Used  . Alcohol Use: 1.2 oz/week    1 Glasses of wine, 1 Shots of liquor per week     Comment: Daily.  . Drug Use: No  . Sexual Activity: Not on file   Other Topics Concern  . Not on file   Social History Narrative    History reviewed. No pertinent family history.  Past Medical History  Diagnosis Date  . Coronary artery disease     minimal, cath 2005 / catheterization August, 2011, 30% in 3 vessels, normal LV function  . Hyperlipidemia   . Atrial fibrillation 2011, 2015    Multaq Started September, 2011, dose adjusted October, 2011, 200 mg a.m., 400 mg p.o.  . Monoclonal gammopathy     granfortuna  . TIA (transient ischemic attack)     Dr. Erling Cruz,, question in the past. when off ASA for 10 days  . GERD (gastroesophageal reflux disease)     Esophageal dilatation in 1992, some esophageal spasm  . S/P transurethral resection of prostate 2004  2004  . MR (mitral regurgitation) 2009    mild, Echo, July, 2011  . Cervical spine disease   . Ejection fraction     EF 60%, echo, 2009 / TEE normal August, 2011 / EF 60%, echo, July, 2011  . Drug therapy     Pradaxa Started July, 2011  . Aortic stenosis     Mild, echo, July, 2011  . Aortic insufficiency     Mild, echo, July, 2011  . Carotid artery disease     Doppler, 2008 no significant abnormality  . Diarrhea     Chronic  . Incomplete right bundle branch block     August, 2012  . Prostate cancer 11/22/2011  . Painless hematuria  03/27/2012    Occurred x 2 5/12 & 5/13 on Pradaxa 75 mg BID  . Bradycardia     Sinus bradycardia while on 25 Lopressor twice a day October, 2013  . Memory change     June, 2014  . Leg fatigue     June, 2014  . Anemia   . Renal insufficiency     Prior creatinine 1.2 / September, 2011.. 1.7 / October, 2011.. 1.8    Past Surgical History  Procedure Laterality Date  . History of turp    . Hernia repair    . Cataract extraction    . Fibular fracture repair    . Cardioversion  08/01/2012    Procedure: CARDIOVERSION; Surgeon: Carlena Bjornstad, MD; Location: [facility] ENDOSCOPY; Service: Cardiovascular; Laterality: N/A;  . Cardioversion N/A 11/29/2013    Procedure: CARDIOVERSION; Surgeon: Carlena Bjornstad, MD; Location: [facility] ENDOSCOPY; Service: Cardiovascular; Laterality: N/A;  . Colonoscopy    . Cardiac catheterization    . Eye surgery Bilateral     cataract extraction  . Shoulder arthroscopy with rotator cuff repair Right 01/23/2014    DR Noemi Chapel   . Shoulder arthroscopy with rotator cuff repair Right 01/23/2014    Procedure: RIGHT SHOULDER ARTHROSCOPY WITH DEBRIDEMENT AND ROTATOR CUFF REPAIR; Surgeon: Lorn Junes, MD; Location: [place]; Service: Orthopedics; Laterality: Right;  . Orif orbital fracture Bilateral 06/22/2014    Procedure: OPEN REDUCTION INTERNAL FIXATION (ORIF) BILATERAL LEFORTE1 FRACTURE OF MAXILLA, HYBRID ARCH BARS ; Surgeon: Izora Gala, MD; Location: [place]; Service: ENT; Laterality: Bilateral;  . Open reduction internal fixation (orif) metacarpal Left 06/22/2014    Procedure: OPEN REDUCTION INTERNAL FIXATION (ORIF) LEFT HAND METACARPAL; Surgeon: Roseanne Kaufman, MD; Location: [place]; Service: Orthopedics; Laterality: Left;    Patient Active Problem List   Diagnosis Date Noted  . Incomplete right bundle branch block     Priority: High  .  Atrial fibrillation     Priority: High  . Coronary artery disease     Priority: High  . MR (mitral regurgitation)     Priority: High  . Drug therapy     Priority: High  . Aortic stenosis     Priority: High  . Aortic insufficiency     Priority: High  . LeFort I fracture of maxilla 06/22/2014  . Preop cardiovascular exam 06/19/2014  . Facial fracture due to fall 06/07/2014  . Rotator cuff tear, right 01/23/2014  . GERD (gastroesophageal reflux disease)   . Skin lesion of back 12/24/2013  . Memory change   . Leg fatigue   . Bradycardia   . Carotid artery disease   . Painless hematuria 03/27/2012  . Prostate cancer 11/22/2011  . Hyperlipidemia   . Monoclonal gammopathy   . TIA (transient ischemic attack)   .  S/P transurethral resection of prostate   . Cervical spine disease   . Renal insufficiency   . Ejection fraction   . DIARRHEA, CHRONIC 10/14/2010    ROS  Patient denies fever, chills, headache, sweats, rash, change in vision, change in hearing, chest pain, cough, nausea or vomiting, urinary symptoms. All other systems are reviewed and are negative.  PHYSICAL EXAM Patient is oriented to person time and place. Affect is normal. 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 He fully understands all the medical aspects of his care. Head is atraumatic. Sclerae and conjunctiva are normal. The surgery that he has had to his maxillofacial area are completely healed. There is no jugular venous distention. Lungs are clear. Respiratory effort is not labored. Cardiac exam reveals S1 and S2. There is a systolic murmur consistent with his mild aortic valve disease. The rhythm is irregularly irregular. The rate is controlled. Abdomen is soft. There is trace peripheral edema in his right ankle. There are no obvious musculoskeletal deformities. There are no skin rashes. Neurologic is  grossly intact.  Filed Vitals:   11/25/14 1310  BP: 120/70  Pulse: 88  Height: '5\' 7"'  (1.702 m)  Weight: 192 lb (87.091 kg)   EKG is done today and reviewed by me. He has old right bundle branch block. The rhythm is atrial fibrillation with a controlled rate.  ASSESSMENT & PLAN              Aortic insufficiency - Carlena Bjornstad, MD at 11/25/2014 1:33 PM     Status: Written Related Problem: Aortic insufficiency   Expand All Collapse All   We know that he has mild aortic insufficiency. No further workup now.            Aortic stenosis - Carlena Bjornstad, MD at 11/25/2014 1:35 PM     Status: Written Related Problem: Aortic stenosis   Expand All Collapse All   We know that he has mild aortic stenosis. He does not need any further tests at this time.            Atrial fibrillation - Carlena Bjornstad, MD at 11/25/2014 1:37 PM     Status: Written Related Problem: Atrial fibrillation   Expand All Collapse All   The patient has return of atrial fibrillation. The last documented atrial fibrillation was January, 2015. He was cardioverted with one shock then and did quite well. We will proceed with cardioversion on November 27, 2014. History of clay he has met with Dr. Rayann Heman. It is felt that atrial fibrillation would not be a good approach. If he converts and seems to feel better but does not hold, we will consider Tikosyn or amiodarone. However the first attempt will be made on his current medications. He is anticoagulated.             Bradycardia - Carlena Bjornstad, MD at 11/25/2014 1:37 PM     Status: Written Related Problem: Bradycardia   Expand All Collapse All   He's had some sinus bradycardia while on higher doses of metoprolol. He continues on a low dose at this time. I've chosen to hold his morning dose of metoprolol on the day of the cardioversion.            Facial fracture due to fall - Carlena Bjornstad, MD at 11/25/2014 1:38 PM      Status: Written Related Problem: Facial fracture due to fall   Expand All Collapse All  He has recovered very nicely from this.  As part of today's evaluation I spent greater than 25 minutes on his total care. More than half of this time is been with direct contact with him to review the whole history and decide once again if we should proceed with cardioversion.         Patient is stable today and ready for cardioversion. There is no change in his physical exam since November 25, 2014.

## 2014-11-28 ENCOUNTER — Encounter (HOSPITAL_COMMUNITY): Payer: Self-pay | Admitting: Cardiology

## 2014-12-16 ENCOUNTER — Ambulatory Visit (INDEPENDENT_AMBULATORY_CARE_PROVIDER_SITE_OTHER): Payer: Medicare PPO | Admitting: Cardiology

## 2014-12-16 ENCOUNTER — Other Ambulatory Visit: Payer: Self-pay | Admitting: Oncology

## 2014-12-16 ENCOUNTER — Encounter: Payer: Self-pay | Admitting: Cardiology

## 2014-12-16 VITALS — BP 150/70 | HR 53 | Ht 67.0 in | Wt 196.4 lb

## 2014-12-16 DIAGNOSIS — I350 Nonrheumatic aortic (valve) stenosis: Secondary | ICD-10-CM

## 2014-12-16 DIAGNOSIS — S42009A Fracture of unspecified part of unspecified clavicle, initial encounter for closed fracture: Secondary | ICD-10-CM | POA: Insufficient documentation

## 2014-12-16 DIAGNOSIS — S43005A Unspecified dislocation of left shoulder joint, initial encounter: Secondary | ICD-10-CM

## 2014-12-16 DIAGNOSIS — R001 Bradycardia, unspecified: Secondary | ICD-10-CM

## 2014-12-16 DIAGNOSIS — I480 Paroxysmal atrial fibrillation: Secondary | ICD-10-CM

## 2014-12-16 NOTE — Assessment & Plan Note (Signed)
Aortic valvular disease is stable. No change in therapy.

## 2014-12-16 NOTE — Progress Notes (Signed)
HPI Patient is seen today to follow-up his atrial fibrillation. I saw him last on November 25, 2014 in the office. At that time he was back in atrial fibrillation and we planned to do a cardioversion on January 13. This procedure was done without difficulty. He converted with one shock to sinus rhythm. His remained in sinus bradycardia since that time. From the cardiac viewpoint he's feeling well.  Unfortunately he has had another injury since that time. He was considering changing a battery in a smoke detector. He brought a ladder to do this but decided that it was not safe for him to use the latter. Unfortunately he then tripped over the latter and injured his left shoulder. He is wearing a sling. He has a clavicle fracture. He is worried about other findings in his shoulder and he will be seeing Dr. Noemi Chapel later today.  Allergies  Allergen Reactions  . Guaifenesin & Derivatives Other (See Comments)    Unknown  . Penicillins Other (See Comments)    Unknown  . Chlorhexidine Rash  . Oxycodone Itching and Rash  . Oxycontin [Oxycodone Hcl] Itching and Rash    Current Outpatient Prescriptions  Medication Sig Dispense Refill  . aspirin 81 MG tablet Take 81 mg by mouth 3 (three) times a week. Monday Wednesday and Friday.    Marland Kitchen atorvastatin (LIPITOR) 10 MG tablet Take 1 tablet (10 mg total) by mouth daily. 30 tablet 9  . dabigatran (PRADAXA) 75 MG CAPS capsule Take 1 capsule (75 mg total) by mouth every 12 (twelve) hours. 60 capsule 5  . dronedarone (MULTAQ) 400 MG tablet Take 1 tablet (400 mg total) by mouth 2 (two) times daily with a meal. 60 tablet 5  . HYDROmorphone (DILAUDID) 2 MG tablet Take by mouth daily as needed. FOR PAIN  0  . metoprolol tartrate (LOPRESSOR) 25 MG tablet Take 1 tablet (25 mg total) by mouth 2 (two) times daily. 180 tablet 3  . naproxen sodium (ANAPROX) 220 MG tablet Take 220-440 mg by mouth daily as needed (for pain).    . Propylene Glycol (SYSTANE BALANCE) 0.6 %  SOLN Apply 1-2 drops to eye every 6 (six) hours as needed (for dryness/irritation).     No current facility-administered medications for this visit.    History   Social History  . Marital Status: Widowed    Spouse Name: N/A    Number of Children: N/A  . Years of Education: N/A   Occupational History  . Not on file.   Social History Main Topics  . Smoking status: Former Research scientist (life sciences)  . Smokeless tobacco: Never Used  . Alcohol Use: 1.2 oz/week    1 Glasses of wine, 1 Shots of liquor per week     Comment: Daily.  . Drug Use: No  . Sexual Activity: Not on file   Other Topics Concern  . Not on file   Social History Narrative    History reviewed. No pertinent family history.  Past Medical History  Diagnosis Date  . Coronary artery disease     minimal, cath 2005 / catheterization August, 2011, 30% in 3 vessels, normal LV function  . Hyperlipidemia   . Atrial fibrillation 2011, 2015    Multaq Started September, 2011, dose adjusted October, 2011, 200 mg a.m., 400 mg p.o.  . Monoclonal gammopathy     granfortuna  . TIA (transient ischemic attack)     Dr. Erling Cruz,, question in the past. when off ASA for 10 days  .  GERD (gastroesophageal reflux disease)     Esophageal dilatation in 1992, some esophageal spasm  . S/P transurethral resection of prostate 2004    2004  . MR (mitral regurgitation) 2009    mild, Echo, July, 2011  . Cervical spine disease   . Ejection fraction     EF 60%, echo, 2009  /  TEE normal August, 2011 /   EF 60%, echo, July, 2011  . Drug therapy     Pradaxa Started July, 2011  . Aortic stenosis     Mild, echo, July, 2011  . Aortic insufficiency     Mild, echo, July, 2011  . Carotid artery disease     Doppler, 2008 no significant abnormality  . Diarrhea     Chronic  . Incomplete right bundle branch block     August, 2012  . Prostate cancer 11/22/2011  . Painless hematuria 03/27/2012    Occurred x 2 5/12 & 5/13 on Pradaxa 75 mg BID  . Bradycardia      Sinus bradycardia while on 25 Lopressor twice a day October, 2013  . Memory change     June, 2014  . Leg fatigue     June, 2014  . Anemia   . Renal insufficiency     Prior creatinine 1.2 /  September, 2011.. 1.7  /  October, 2011.. 1.8    Past Surgical History  Procedure Laterality Date  . History of turp    . Hernia repair    . Cataract extraction    . Fibular fracture repair    . Cardioversion  08/01/2012    Procedure: CARDIOVERSION;  Surgeon: Carlena Bjornstad, MD;  Location: [facility] ENDOSCOPY;  Service: Cardiovascular;  Laterality: N/A;  . Cardioversion N/A 11/29/2013    Procedure: CARDIOVERSION;  Surgeon: Carlena Bjornstad, MD;  Location: [facility] ENDOSCOPY;  Service: Cardiovascular;  Laterality: N/A;  . Colonoscopy    . Cardiac catheterization    . Eye surgery Bilateral     cataract extraction  . Shoulder arthroscopy with rotator cuff repair Right 01/23/2014    DR Noemi Chapel   . Shoulder arthroscopy with rotator cuff repair Right 01/23/2014    Procedure: RIGHT SHOULDER ARTHROSCOPY WITH DEBRIDEMENT AND ROTATOR CUFF REPAIR;  Surgeon: Lorn Junes, MD;  Location: [place];  Service: Orthopedics;  Laterality: Right;  . Orif orbital fracture Bilateral 06/22/2014    Procedure: OPEN REDUCTION INTERNAL FIXATION (ORIF) BILATERAL LEFORTE1 FRACTURE OF MAXILLA, HYBRID ARCH BARS ;  Surgeon: Izora Gala, MD;  Location: [place];  Service: ENT;  Laterality: Bilateral;  . Open reduction internal fixation (orif) metacarpal Left 06/22/2014    Procedure: OPEN REDUCTION INTERNAL FIXATION (ORIF) LEFT HAND METACARPAL;  Surgeon: Roseanne Kaufman, MD;  Location: [place];  Service: Orthopedics;  Laterality: Left;  . Cardioversion N/A 11/27/2014    Procedure: CARDIOVERSION;  Surgeon: Carlena Bjornstad, MD;  Location: [facility] ENDOSCOPY;  Service: Cardiovascular;  Laterality: N/A;    Patient Active Problem List   Diagnosis Date Noted  . Incomplete right bundle branch block     Priority: High  . Atrial fibrillation     Priority: High  .  Coronary artery disease     Priority: High  . MR (mitral regurgitation)     Priority: High  . Drug therapy     Priority: High  . Aortic stenosis     Priority: High  . Aortic insufficiency     Priority: High  . LeFort I fracture of maxilla 06/22/2014  . Preop  cardiovascular exam 06/19/2014  . Facial fracture due to fall 06/07/2014  . Rotator cuff tear, right 01/23/2014  . GERD (gastroesophageal reflux disease)   . Skin lesion of back 12/24/2013  . Memory change   . Leg fatigue   . Bradycardia   . Carotid artery disease   . Painless hematuria 03/27/2012  . Prostate cancer 11/22/2011  . Hyperlipidemia   . Monoclonal gammopathy   . TIA (transient ischemic attack)   . S/P transurethral resection of prostate   . Cervical spine disease   . Renal insufficiency   . Ejection fraction   . DIARRHEA, CHRONIC 10/14/2010    ROS  Patient denies fever, chills, headache, sweats, rash, change in vision, change in hearing, chest pain, cough, nausea or vomiting, urinary symptoms. All other systems are reviewed and are negative other than the history of present illness.  PHYSICAL EXAM He is stable today. He is wearing a sling on his left arm. He is in relatively good spirits despite the problem with his left shoulder. Head is atraumatic. Sclera and conjunctiva are normal. There is no jugulovenous distention. Lungs are clear. Respiratory effort is nonlabored. Cardiac exam reveals S1 and S2. There is a murmur of aortic stenosis. He has ecchymoses on his left shoulder and left anterior chest. There is no marked hematoma. Abdomen is soft. There is no peripheral edema.  Filed Vitals:   12/16/14 0950  BP: 150/70  Pulse: 53  Height: 5\' 7"  (1.702 m)  Weight: 196 lb 6.4 oz (89.086 kg)   EKG is done today and reviewed by me. There is old right bundle branch block. There is old sinus bradycardia.  ASSESSMENT & PLAN

## 2014-12-16 NOTE — Assessment & Plan Note (Signed)
He was successfully cardioverted in November 27, 2014. It is of note that he does not appear to revert atrial fib when he has his other medical problems. He is stable.

## 2014-12-16 NOTE — Assessment & Plan Note (Signed)
Asymptomatic sinus bradycardia on his current meds. No change in therapy.

## 2014-12-16 NOTE — Patient Instructions (Signed)
Your physician recommends that you continue on your current medications as directed. Please refer to the Current Medication list given to you today. Your physician wants you to follow-up in: 4 months You will receive a reminder letter in the mail two months in advance. If you don't receive a letter, please call our office to schedule the follow-up appointment.  

## 2014-12-16 NOTE — Assessment & Plan Note (Addendum)
The patient tripped over a ladder and hurt his left clavicle and shoulder. He is wearing a brace. He will be seeing Dr. Noemi Chapel later today to see if the injury is any more extensive. The patient's cardiac status is stable. Hopefully he will not need any further procedures. However, he is stable from the cardiac viewpoint to have orthopedic procedures if necessary for his left shoulder. His Pradaxa would have to be held. His GFR is in the range of 40. Therefore Pradaxa should be held in the range of 3-5 days. My preference would be to hold his Pradaxa for 5 days if a surgical procedures needed.

## 2014-12-23 ENCOUNTER — Other Ambulatory Visit: Payer: Self-pay | Admitting: *Deleted

## 2014-12-23 MED ORDER — DABIGATRAN ETEXILATE MESYLATE 75 MG PO CAPS: 75.0000 mg | ORAL_CAPSULE | Freq: Two times a day (BID) | ORAL | Status: DC

## 2014-12-23 MED ORDER — ATORVASTATIN CALCIUM 10 MG PO TABS: 10.0000 mg | ORAL_TABLET | Freq: Every day | ORAL | Status: DC

## 2015-01-06 ENCOUNTER — Telehealth: Payer: Self-pay | Admitting: Cardiology

## 2015-01-06 NOTE — Telephone Encounter (Signed)
Pt states he woke up Saturday AM and felt weak. He checked it pulse and was irregular. He took it easy Saturday. He did not feel it was necessary to call MD on  call.  He woke up Sunday and felt better. He checked his pulse and it was regular.  Pt states he feels fine now. He is calling today because he wanted Dr Ron Parker to have this information.  Pt advised I will forward to Dr Ron Parker for review.

## 2015-01-06 NOTE — Telephone Encounter (Signed)
New message    Patient calling    On Saturday went into Afib .    On Sunday went back into sinus rhythm  Patient is not asking for a call back unless Dr.Katz feels it's necessary.

## 2015-01-20 ENCOUNTER — Other Ambulatory Visit (INDEPENDENT_AMBULATORY_CARE_PROVIDER_SITE_OTHER): Payer: Medicare PPO

## 2015-01-20 DIAGNOSIS — D472 Monoclonal gammopathy: Secondary | ICD-10-CM

## 2015-01-20 DIAGNOSIS — C61 Malignant neoplasm of prostate: Secondary | ICD-10-CM

## 2015-01-20 LAB — CBC WITH DIFFERENTIAL/PLATELET
Basophils Absolute: 0 10*3/uL (ref 0.0–0.1)
Basophils Relative: 0 % (ref 0–1)
EOS ABS: 0.3 10*3/uL (ref 0.0–0.7)
EOS PCT: 4 % (ref 0–5)
HCT: 36.7 % — ABNORMAL LOW (ref 39.0–52.0)
HEMOGLOBIN: 12 g/dL — AB (ref 13.0–17.0)
LYMPHS ABS: 2.6 10*3/uL (ref 0.7–4.0)
LYMPHS PCT: 40 % (ref 12–46)
MCH: 32.5 pg (ref 26.0–34.0)
MCHC: 32.7 g/dL (ref 30.0–36.0)
MCV: 99.5 fL (ref 78.0–100.0)
MONOS PCT: 8 % (ref 3–12)
MPV: 9.6 fL (ref 8.6–12.4)
Monocytes Absolute: 0.5 10*3/uL (ref 0.1–1.0)
Neutro Abs: 3.2 10*3/uL (ref 1.7–7.7)
Neutrophils Relative %: 48 % (ref 43–77)
Platelets: 248 10*3/uL (ref 150–400)
RBC: 3.69 MIL/uL — ABNORMAL LOW (ref 4.22–5.81)
RDW: 14.7 % (ref 11.5–15.5)
WBC: 6.6 10*3/uL (ref 4.0–10.5)

## 2015-01-20 LAB — COMPREHENSIVE METABOLIC PANEL
ALBUMIN: 3.5 g/dL (ref 3.5–5.2)
ALT: 15 U/L (ref 0–53)
AST: 20 U/L (ref 0–37)
Alkaline Phosphatase: 48 U/L (ref 39–117)
BUN: 15 mg/dL (ref 6–23)
CO2: 24 meq/L (ref 19–32)
Calcium: 9 mg/dL (ref 8.4–10.5)
Chloride: 108 mEq/L (ref 96–112)
Creat: 1.6 mg/dL — ABNORMAL HIGH (ref 0.50–1.35)
Glucose, Bld: 95 mg/dL (ref 70–99)
POTASSIUM: 4.5 meq/L (ref 3.5–5.3)
SODIUM: 140 meq/L (ref 135–145)
TOTAL PROTEIN: 7.3 g/dL (ref 6.0–8.3)
Total Bilirubin: 0.6 mg/dL (ref 0.2–1.2)

## 2015-01-21 LAB — KAPPA/LAMBDA LIGHT CHAINS
KAPPA LAMBDA RATIO: 4.68 — AB (ref 0.26–1.65)
Kappa free light chain: 6.69 mg/dL — ABNORMAL HIGH (ref 0.33–1.94)
Lambda Free Lght Chn: 1.43 mg/dL (ref 0.57–2.63)

## 2015-01-21 LAB — IGG, IGA, IGM
IGA: 108 mg/dL (ref 68–379)
IgG (Immunoglobin G), Serum: 2680 mg/dL — ABNORMAL HIGH (ref 650–1600)
IgM, Serum: 81 mg/dL (ref 41–251)

## 2015-01-21 LAB — PSA: PSA: 2.4 ng/mL (ref <=4.00)

## 2015-01-23 ENCOUNTER — Telehealth: Payer: Self-pay | Admitting: *Deleted

## 2015-01-23 NOTE — Telephone Encounter (Signed)
Pt called / informed labs are at his baseline per Dr Beryle Beams and he will forward copies to Dr Ron Parker and Dr Gaynelle Arabian. Asked about his Hgb which is 12.0. Stated he will see [place] on Monday@ 0900AM.

## 2015-01-23 NOTE — Telephone Encounter (Signed)
-----   Message from Annia Belt, MD sent at 01/21/2015  5:03 PM EST ----- Call pt: lab at his baseline - he can view on my chart. I will forward copies to Dr Ron Parker & Gaynelle Arabian

## 2015-01-27 ENCOUNTER — Encounter: Payer: Self-pay | Admitting: Oncology

## 2015-01-27 ENCOUNTER — Ambulatory Visit (INDEPENDENT_AMBULATORY_CARE_PROVIDER_SITE_OTHER): Payer: Medicare PPO | Admitting: Oncology

## 2015-01-27 VITALS — BP 160/56 | HR 52 | Temp 97.8°F | Wt 195.7 lb

## 2015-01-27 DIAGNOSIS — Z8546 Personal history of malignant neoplasm of prostate: Secondary | ICD-10-CM | POA: Diagnosis not present

## 2015-01-27 DIAGNOSIS — D472 Monoclonal gammopathy: Secondary | ICD-10-CM | POA: Diagnosis not present

## 2015-01-27 DIAGNOSIS — I4891 Unspecified atrial fibrillation: Secondary | ICD-10-CM

## 2015-01-27 DIAGNOSIS — E785 Hyperlipidemia, unspecified: Secondary | ICD-10-CM

## 2015-01-27 DIAGNOSIS — I350 Nonrheumatic aortic (valve) stenosis: Secondary | ICD-10-CM | POA: Diagnosis not present

## 2015-01-27 DIAGNOSIS — C61 Malignant neoplasm of prostate: Secondary | ICD-10-CM

## 2015-01-27 DIAGNOSIS — I2510 Atherosclerotic heart disease of native coronary artery without angina pectoris: Secondary | ICD-10-CM

## 2015-01-27 DIAGNOSIS — Z9889 Other specified postprocedural states: Secondary | ICD-10-CM

## 2015-01-27 DIAGNOSIS — Z8781 Personal history of (healed) traumatic fracture: Secondary | ICD-10-CM

## 2015-01-27 DIAGNOSIS — Z7901 Long term (current) use of anticoagulants: Secondary | ICD-10-CM | POA: Insufficient documentation

## 2015-01-27 DIAGNOSIS — K219 Gastro-esophageal reflux disease without esophagitis: Secondary | ICD-10-CM

## 2015-01-27 DIAGNOSIS — Z7982 Long term (current) use of aspirin: Secondary | ICD-10-CM

## 2015-01-27 DIAGNOSIS — M4690 Unspecified inflammatory spondylopathy, site unspecified: Secondary | ICD-10-CM

## 2015-01-27 DIAGNOSIS — Z8673 Personal history of transient ischemic attack (TIA), and cerebral infarction without residual deficits: Secondary | ICD-10-CM

## 2015-01-27 HISTORY — DX: Long term (current) use of anticoagulants: Z79.01

## 2015-01-27 NOTE — Progress Notes (Signed)
Patient ID: [name] [name], male   DOB: 1925/10/01, 81 y.o.   MRN: [id number] Hematology and Oncology Follow Up Visit  TREVAR BOEHRINGER [id number] 01-13-1925 81 y.o. 01/27/2015 1:56 PM   Principle Diagnosis: Encounter Diagnoses  Name Primary?  . Prostate cancer   . Monoclonal gammopathy Yes  . Chronic anticoagulation    Clinical summary: Followup visit for this 81 year old retired Engineer, civil (consulting) with a history of IgG kappa monoclonal gammopathy of undetermined significance initially diagnosed in July 2000 and localized prostate cancer treated with radiofrequency ablation also diagnosed in 2000.  Main active problem relates to his atrial fibrillation. Initial onset in 2011. He has now had 4 cardioversion procedures most recent 11/27/2014.  He continues to revert back into atrial fibrillation. He has had 2 separate falls over the last year. The first occurred in July 2015 when he fell in his backyard while gardening. He sustained multiple lacerations of his face. Bilateral LeFort I nasal and maxillary sinus fractures. Fracture of the bones of his left hand, Left chest wall contusion. Left fifth finger laceration. He ultimately required corrective surgery on his facial bones by Dr. Constance Holster and open reduction internal fixation of the left hand fracture by Dr. Amedeo Plenty both in August 2015. He fell again at home in February 2016 and fractured his right clavicle. He has been under the care of Dr. Nicholaus Bloom orthopedic surgery.   Interim History:  He still has a sling on the right arm which will come off soon. He feels that about 2 weeks ago he felt sudden onset of palpitations again and that he is back in nature fibrillation. He saw his cardiologist on February 1. He was in sinus rhythm at that time.  Medications: reviewed  Allergies:  Allergies  Allergen Reactions  . Guaifenesin & Derivatives Other (See Comments)    Unknown  . Penicillins Other (See Comments)    Unknown  . Chlorhexidine Rash  .  Oxycodone Itching and Rash  . Oxycontin [Oxycodone Hcl] Itching and Rash    Review of Systems: See history of present illness Remaining ROS negative:   Physical Exam: Blood pressure 160/56, pulse 52, temperature 97.8 F (36.6 C), temperature source Oral, weight 195 lb 11.2 oz (88.769 kg), SpO2 100 %. Wt Readings from Last 3 Encounters:  01/27/15 195 lb 11.2 oz (88.769 kg)  12/16/14 196 lb 6.4 oz (89.086 kg)  11/25/14 192 lb (87.091 kg)     General appearance: Well-nourished Caucasian man HENNT: Pharynx no erythema, exudate, mass, or ulcer. No thyromegaly or thyroid nodules Lymph nodes: No cervical, supraclavicular, or axillary lymphadenopathy Breasts:  Lungs: Clear to auscultation, resonant to percussion throughout Heart: Regular rhythm, 3/6 systolic murmur heard in aortic area, left sternal border, and cardiac apex. murmur, no gallop, no rub, no click, no edema Abdomen: Soft, nontender, normal bowel sounds, no mass, no organomegaly Extremities: No edema, no calf tenderness Musculoskeletal: no joint deformities GU:  Vascular: Carotid pulses 2+, no bruits,  Neurologic: Alert, oriented, PERRLA, cranial nerves grossly normal, motor strength 5 over 5, reflexes 1+ symmetric, upper body coordination normal, gait normal, Skin: No rash or ecchymosis  Lab Results: CBC W/Diff    Component Value Date/Time   WBC 6.6 01/20/2015 0950   WBC 7.4 01/14/2014 0804   RBC 3.69* 01/20/2015 0950   RBC 3.66* 01/14/2014 0804   HGB 12.0* 01/20/2015 0950   HGB 12.1* 01/14/2014 0804   HCT 36.7* 01/20/2015 0950   HCT 36.5* 01/14/2014 0804   PLT 248 01/20/2015 0950  PLT 199 01/14/2014 0804   MCV 99.5 01/20/2015 0950   MCV 99.8* 01/14/2014 0804   MCH 32.5 01/20/2015 0950   MCH 33.2 01/14/2014 0804   MCHC 32.7 01/20/2015 0950   MCHC 33.3 01/14/2014 0804   RDW 14.7 01/20/2015 0950   RDW 13.7 01/14/2014 0804   LYMPHSABS 2.6 01/20/2015 0950   LYMPHSABS 2.6 01/14/2014 0804   MONOABS 0.5  01/20/2015 0950   MONOABS 0.7 01/14/2014 0804   EOSABS 0.3 01/20/2015 0950   EOSABS 0.3 01/14/2014 0804   BASOSABS 0.0 01/20/2015 0950   BASOSABS 0.0 01/14/2014 0804     Chemistry      Component Value Date/Time   NA 140 01/20/2015 0950   NA 139 01/14/2014 0804   K 4.5 01/20/2015 0950   K 4.8 01/14/2014 0804   CL 108 01/20/2015 0950   CL 107 03/19/2013 0949   CO2 24 01/20/2015 0950   CO2 25 01/14/2014 0804   BUN 15 01/20/2015 0950   BUN 16.7 01/14/2014 0804   CREATININE 1.60* 01/20/2015 0950   CREATININE 1.7* 11/25/2014 1350   CREATININE 1.7* 01/14/2014 0804   CREATININE 1.59* 09/27/2013 0848      Component Value Date/Time   CALCIUM 9.0 01/20/2015 0950   CALCIUM 9.4 01/14/2014 0804   ALKPHOS 48 01/20/2015 0950   ALKPHOS 45 01/14/2014 0804   AST 20 01/20/2015 0950   AST 21 01/14/2014 0804   ALT 15 01/20/2015 0950   ALT 20 01/14/2014 0804   BILITOT 0.6 01/20/2015 0950   BILITOT 0.71 01/14/2014 0804    IgG 2680 mg percent on 01/20/2015 compared with 2340 on 07/23/2014. Current value within range of previous values. Serum free kappa light chains 6.69 mg percent compared with 3.8 with ratio 4.68 both higher than previous baseline. Of note hemoglobin back to his baseline at 12 g.   Radiological Studies: No results found.  Impression:   #1. IgG kappa monoclonal gammopathy of undetermined significance. Initial diagnosis July 2000.  Improved hemoglobin, total IgG within range of previous values but trend for increase in the serum kappa free light chains. No immediate concern and I will keep him on a 6 month interval laboratory follow-up.  #2. Localized prostate cancer treated with radiofrequency ablation at time of diagnosis in February of 2000. Stable PSA with minor fluctuations. Most recent fine you lower than previous baseline at 2.4 on 01/20/2015  #3. History of 2 isolated episodes of self-limited gross hematuria  This may be a result of anticoagulation with  Pradaxa. No recent recurrences.  #4. Single vessel coronary artery disease.   #5. Aortic systolic murmur with mild aortic stenosis and regurgitation on echocardiogram.   #6. Atrial fibrillation  Now status post cardioversion 4. He continues on full dose anticoagulation with Pradaxa 75 mg twice daily , aspirin 81 mg daily, multi 400 mg twice daily, Lopressor 25 mg twice a day.  #7. GERD   #8. Hyperlipidemia   #9. Status post excision multiple basal cell carcinomas   #10. Degenerative arthritis of the spine.   #11. Remote history of a transient ischemic attack.   #12. Orthopedic problems with right shoulder for arthroscopy and rotator cuff repair this week 01/23/2014.  #13.  bilateral traumatic LeFort I fractures of the maxillary bones and fracture of the left wrist metacarpal bone requiring surgery at both sites. 7/15  #14. Nondisplaced fracture right clavicle February 2016.  CC: Patient Care Team: Annia Belt, MD as PCP - General (Oncology) Lennon Alstrom, MD (Neurology) Herbie Baltimore  Buccini, MD (Gastroenterology) Carolan Clines, MD (Urology) Carlena Bjornstad, MD as Consulting Physician (Cardiology)   Annia Belt, MD 3/14/20161:56 PM

## 2015-01-27 NOTE — Patient Instructions (Signed)
Return visit 6 months  Lab 2 weeks before visit at [facility]

## 2015-02-21 ENCOUNTER — Telehealth: Payer: Self-pay | Admitting: Cardiology

## 2015-02-21 NOTE — Telephone Encounter (Signed)
Pt calling to say he went into Afib on Wednesday--wants to know if Dr. Ron Parker wants him to do anything--pls advise

## 2015-02-21 NOTE — Telephone Encounter (Signed)
Pt states he is in AFIB per ascultation.  Pt feels well and is doing usual activities But fatigues more easily. Denies SOB, no CP.  Pt is unable to get BP/P at this time but could in 1 hour when the housekeeper comes in. Pt states he just wants Dr Ron Parker aware. Per Dr Ron Parker: as long as pt still doing ok he is to continue same meds. Pt is to call with any change or worsening symptoms. Pt verbalized understanding and agreed to plan. He was told to go to ED if any changes over the weekend. He verbalized agreement.

## 2015-02-24 ENCOUNTER — Telehealth: Payer: Self-pay | Admitting: Cardiology

## 2015-02-24 NOTE — Telephone Encounter (Signed)
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 He is un-willing to give any details as to what he is calling about. He wants to speak with Dr. Ron Parker. I advised that he is marked on Vacation this week and I am unsure how reachable he is as his nurse is out today as well.  He just states he has a complicated question/ situation to ask. He would like the DOD to call him today. I advised I will give the message to Dr. Harrington Challenger to see if she can call him back today.

## 2015-02-24 NOTE — Telephone Encounter (Signed)
New message    Patient call wanted to speak with Dr. Ron Parker. Inform patient that he's on vacation.   Patient stated his message is very details will discuss when the nurse called back.

## 2015-02-25 ENCOUNTER — Telehealth: Payer: Self-pay | Admitting: *Deleted

## 2015-02-25 ENCOUNTER — Encounter: Payer: Self-pay | Admitting: Cardiology

## 2015-02-25 ENCOUNTER — Ambulatory Visit (INDEPENDENT_AMBULATORY_CARE_PROVIDER_SITE_OTHER): Payer: Medicare PPO | Admitting: Cardiology

## 2015-02-25 VITALS — HR 108

## 2015-02-25 DIAGNOSIS — I480 Paroxysmal atrial fibrillation: Secondary | ICD-10-CM

## 2015-02-25 DIAGNOSIS — I4891 Unspecified atrial fibrillation: Secondary | ICD-10-CM

## 2015-02-25 MED ORDER — METOPROLOL TARTRATE 50 MG PO TABS: 50.0000 mg | ORAL_TABLET | Freq: Two times a day (BID) | ORAL | Status: DC

## 2015-02-25 NOTE — Telephone Encounter (Signed)
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   Had called saying that he felt like he was back in afib  HR 90s to 100  BP 128/60 He wnatined to Take extra metoprolol 25  TID dosing  He felt like this would help slow HR and he may convert He denied dizziness Breathing was ok\  I recomm that he come in for EKG on 4/12.   Will forward note to Dr Ron Parker.

## 2015-02-25 NOTE — Progress Notes (Signed)
Patient in today for EKG per request of Dr. Harrington Challenger. Patient states he feels like he is in active Atrial Fib and his main concern is the rate is in the 100-120 range. He states mild SOB when rate gets elevated so he had called Dr. Harrington Challenger to see if he could increase his metoprolol from 25 mg two times per day (total 50 mg daily dose) to 25 mg three times per day (total 75 mg daily dose).  EKG completed and reviewed by Dr. Marlou Porch. Order obtained for patient to increase medication to Metoprolol 50 mg twice daily (total 100 mg daily dose). Patient verbalized understanding and agreement with treatment plan. Patient also advised to come back for repeat EKG if he does not convert or has worsening symptoms. Patient is taking Pradaxa.

## 2015-02-25 NOTE — Telephone Encounter (Signed)
Dr. Harrington Challenger referred patient to come in for EKG to r/o A Fib recurrence. l

## 2015-02-25 NOTE — Telephone Encounter (Signed)
See nurse visit note.

## 2015-03-03 ENCOUNTER — Telehealth: Payer: Self-pay | Admitting: Cardiology

## 2015-03-03 NOTE — Telephone Encounter (Signed)
New Message  Pt wanted to speak w/ Dr. Ron Parker, informed pt he is not in and wanted to speak w/ Rn. Pt did not relay a message, just that he wanted to speak w/ the Rn. Please call back and discuss.

## 2015-03-03 NOTE — Telephone Encounter (Signed)
**Note De-Identified  Obfuscation** The pt is requesting a call from Dr Ron Parker to discuss his A-Fib and adjustment of his beta blocker.  Will forward message to Dr Ron Parker.

## 2015-03-04 ENCOUNTER — Telehealth: Payer: Self-pay | Admitting: *Deleted

## 2015-03-04 MED ORDER — AMIODARONE HCL 200 MG PO TABS: 200.0000 mg | ORAL_TABLET | Freq: Two times a day (BID) | ORAL | Status: DC

## 2015-03-04 NOTE — Telephone Encounter (Signed)
Called patient per Dr.Katz's request. Patient is aware to stop Multaq as of today and start Amiodarone 200mg  two times per day on Thursday 03/06/2015. Medication sent to [facility] on [address]. Patient is aware that he needs to pick up medication and verbalized understanding of directions.

## 2015-03-10 ENCOUNTER — Ambulatory Visit (INDEPENDENT_AMBULATORY_CARE_PROVIDER_SITE_OTHER): Payer: Medicare PPO | Admitting: Cardiology

## 2015-03-10 ENCOUNTER — Encounter: Payer: Self-pay | Admitting: Cardiology

## 2015-03-10 VITALS — BP 128/84 | HR 113 | Ht 67.0 in | Wt 198.0 lb

## 2015-03-10 DIAGNOSIS — I480 Paroxysmal atrial fibrillation: Secondary | ICD-10-CM | POA: Diagnosis not present

## 2015-03-10 DIAGNOSIS — Z01812 Encounter for preprocedural laboratory examination: Secondary | ICD-10-CM

## 2015-03-10 LAB — PROTIME-INR
INR: 1.5 ratio — ABNORMAL HIGH (ref 0.8–1.0)
PROTHROMBIN TIME: 16.2 s — AB (ref 9.6–13.1)

## 2015-03-10 LAB — CBC WITH DIFFERENTIAL/PLATELET
BASOS ABS: 0 10*3/uL (ref 0.0–0.1)
BASOS PCT: 0.6 % (ref 0.0–3.0)
EOS PCT: 2.6 % (ref 0.0–5.0)
Eosinophils Absolute: 0.2 10*3/uL (ref 0.0–0.7)
HCT: 33.4 % — ABNORMAL LOW (ref 39.0–52.0)
Hemoglobin: 11.3 g/dL — ABNORMAL LOW (ref 13.0–17.0)
LYMPHS ABS: 2.8 10*3/uL (ref 0.7–4.0)
LYMPHS PCT: 37.4 % (ref 12.0–46.0)
MCHC: 33.8 g/dL (ref 30.0–36.0)
MCV: 98.3 fl (ref 78.0–100.0)
MONOS PCT: 9.3 % (ref 3.0–12.0)
Monocytes Absolute: 0.7 10*3/uL (ref 0.1–1.0)
Neutro Abs: 3.7 10*3/uL (ref 1.4–7.7)
Neutrophils Relative %: 50.1 % (ref 43.0–77.0)
PLATELETS: 213 10*3/uL (ref 150.0–400.0)
RBC: 3.39 Mil/uL — AB (ref 4.22–5.81)
RDW: 14.9 % (ref 11.5–15.5)
WBC: 7.4 10*3/uL (ref 4.0–10.5)

## 2015-03-10 LAB — BASIC METABOLIC PANEL
BUN: 19 mg/dL (ref 6–23)
CO2: 26 meq/L (ref 19–32)
Calcium: 8.9 mg/dL (ref 8.4–10.5)
Chloride: 107 mEq/L (ref 96–112)
Creatinine, Ser: 1.65 mg/dL — ABNORMAL HIGH (ref 0.40–1.50)
GFR: 42.08 mL/min — AB (ref >60.00)
GLUCOSE: 107 mg/dL — AB (ref 70–99)
Potassium: 4.5 mEq/L (ref 3.5–5.1)
SODIUM: 135 meq/L (ref 135–145)

## 2015-03-10 NOTE — Patient Instructions (Signed)
Medication Instructions:  None  Labwork: Today (CBCD,BMET and INR)  Testing/Procedures: Your physician has recommended that you have a Cardioversion (DCCV). Electrical Cardioversion uses a jolt of electricity to your heart either through paddles or wired patches attached to your chest. This is a controlled, usually prescheduled, procedure. Defibrillation is done under light anesthesia in the hospital, and you usually go home the day of the procedure. This is done to get your heart back into a normal rhythm. You are not awake for the procedure. Please see the instruction sheet given to you today.    Follow-Up: After cardioversion  Any Other Special Instructions Will Be Listed Below (If Applicable).

## 2015-03-10 NOTE — Assessment & Plan Note (Signed)
The patient again has return of atrial fibrillation.[name] been in touch with him by telephone over the past week and a half. We have stopped his Dronedarone. Oral amiodarone has been started. We will proceed with cardioversion on April 27.

## 2015-03-10 NOTE — H&P (Signed)
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 In the past few weeks he had return of atrial fib. He tried to tolerate remaining in atrial fib and felt poorly. We have been trying to decide on the best dose of his beta blocker for his rate control. I decided with him over the telephone to stop his Dranedarone and start amiodarone.. On Thursday, April 21 he started amiodarone 200 mg orally twice daily. He has tolerated this without difficulty. Today he is stable. We had a good discussion and we agree that it is now time to try repeat cardioversion on amiodarone.    Past Medical History  Diagnosis Date  . Coronary artery disease     minimal, cath 2005 / catheterization August, 2011, 30% in 3 vessels, normal LV function  . Hyperlipidemia   . Atrial fibrillation 2011, 2015    Multaq Started September, 2011, dose adjusted October, 2011, 200 mg a.m., 400 mg p.o.  . Monoclonal gammopathy     granfortuna  . TIA (transient ischemic attack)     Dr. Erling Cruz,, question in the past. when off ASA for 10 days  . GERD (gastroesophageal reflux disease)     Esophageal dilatation in 1992, some esophageal spasm  . S/P transurethral resection of prostate 2004    2004  . MR (mitral regurgitation) 2009    mild, Echo, July, 2011  . Cervical spine disease   . Ejection fraction     EF 60%, echo, 2009 / TEE normal August, 2011 / EF 60%, echo, July, 2011  . Drug therapy     Pradaxa Started July, 2011  . Aortic stenosis     Mild, echo, July, 2011  . Aortic insufficiency     Mild, echo, July, 2011   . Carotid artery disease     Doppler, 2008 no significant abnormality  . Diarrhea     Chronic  . Incomplete right bundle branch block     August, 2012  . Prostate cancer 11/22/2011  . Painless hematuria 03/27/2012    Occurred x 2 5/12 & 5/13 on Pradaxa 75 mg BID  . Bradycardia     Sinus bradycardia while on 25 Lopressor twice a day October, 2013  . Memory change     June, 2014  . Leg fatigue     June, 2014  . Anemia   . Renal insufficiency     Prior creatinine 1.2 / September, 2011.. 1.7 / October, 2011.. 1.8  . Chronic anticoagulation 01/27/2015    pradaxa 75 mg BID for A fib    Past Surgical History  Procedure Laterality Date  . History of turp    . Hernia repair    . Cataract extraction    . Fibular fracture repair    . Cardioversion  08/01/2012    Procedure: CARDIOVERSION; Surgeon: Carlena Bjornstad, MD; Location: [facility] ENDOSCOPY; Service: Cardiovascular; Laterality: N/A;  . Cardioversion N/A 11/29/2013    Procedure: CARDIOVERSION; Surgeon: Carlena Bjornstad, MD; Location: [facility] ENDOSCOPY; Service: Cardiovascular; Laterality: N/A;  . Colonoscopy    . Cardiac catheterization    . Eye surgery Bilateral     cataract extraction  . Shoulder arthroscopy with rotator  cuff repair Right 01/23/2014    DR Noemi Chapel   . Shoulder arthroscopy with rotator cuff repair Right 01/23/2014    Procedure: RIGHT SHOULDER ARTHROSCOPY WITH DEBRIDEMENT AND ROTATOR CUFF REPAIR; Surgeon: Lorn Junes, MD; Location: [place]; Service: Orthopedics; Laterality: Right;  . Orif orbital fracture Bilateral 06/22/2014    Procedure: OPEN REDUCTION INTERNAL FIXATION (ORIF) BILATERAL LEFORTE1 FRACTURE OF MAXILLA, HYBRID ARCH BARS ; Surgeon: Izora Gala, MD; Location: [place]; Service: ENT; Laterality: Bilateral;  . Open reduction internal fixation (orif) metacarpal Left 06/22/2014     Procedure: OPEN REDUCTION INTERNAL FIXATION (ORIF) LEFT HAND METACARPAL; Surgeon: Roseanne Kaufman, MD; Location: [place]; Service: Orthopedics; Laterality: Left;  . Cardioversion N/A 11/27/2014    Procedure: CARDIOVERSION; Surgeon: Carlena Bjornstad, MD; Location[facility] ENDOSCOPY; Service: Cardiovascular; Laterality: N/A;    Patient Active Problem List   Diagnosis Date Noted  . Incomplete right bundle branch block     Priority: High  . Atrial fibrillation     Priority: High  . Coronary artery disease     Priority: High  . MR (mitral regurgitation)     Priority: High  . Drug therapy     Priority: High  . Aortic stenosis     Priority: High  . Aortic insufficiency     Priority: High  . Chronic anticoagulation 01/27/2015  . Clavicle fracture 12/16/2014  . LeFort I fracture of maxilla 06/22/2014  . Preop cardiovascular exam 06/19/2014  . Facial fracture due to fall 06/07/2014  . Rotator cuff tear, right 01/23/2014  . GERD (gastroesophageal reflux disease)   . Skin lesion of back 12/24/2013  . Memory change   . Leg fatigue   . Bradycardia   . Carotid artery disease   . Painless hematuria 03/27/2012  . Prostate cancer 11/22/2011  . Hyperlipidemia   . Monoclonal gammopathy   . TIA (transient ischemic attack)   . S/P transurethral resection of prostate   . Cervical spine disease   . Renal insufficiency   . Ejection fraction   . DIARRHEA, CHRONIC 10/14/2010      Current Outpatient Prescriptions  Medication Sig Dispense Refill  . amiodarone (PACERONE) 200 MG tablet Take 1 tablet (200 mg total) by mouth 2 (two) times daily. 60 tablet 3  . aspirin EC 81 MG tablet Take 81 mg by mouth every Monday, Wednesday, and Friday.    Marland Kitchen atorvastatin (LIPITOR) 10 MG tablet Take 1 tablet (10 mg total) by mouth daily. 30 tablet 3  . dabigatran  (PRADAXA) 75 MG CAPS capsule Take 1 capsule (75 mg total) by mouth every 12 (twelve) hours. 60 capsule 3  . famotidine (PEPCID) 10 MG tablet Take 10 mg by mouth daily as needed (acid reflux).    . metoprolol (LOPRESSOR) 50 MG tablet Take 1 tablet (50 mg total) by mouth 2 (two) times daily. 180 tablet 3  . naproxen sodium (ANAPROX) 220 MG tablet Take 220 mg by mouth daily as needed (pain). Aleve    . Phenylephrine HCl (NEO-SYNEPHRINE NA) Place 1 spray into both nostrils daily as needed (seasonal allergies).    . Propylene Glycol (SYSTANE BALANCE) 0.6 % SOLN Apply 1-2 drops to eye every 6 (six) hours as needed (dry eyes/irritation).      No current facility-administered medications for this visit.    Allergies: Guaifenesin & derivatives; Keflex; Chlorhexidine; Oxycodone; Oxycontin; and Penicillins    Social History: The patient  reports that he has quit smoking. He has never used smokeless tobacco. He reports that he  drinks about 1.2 oz of alcohol per week. He reports that he does not use illicit drugs.   Family History: There is no significant family history of coronary disease.   ROS: Please see the history of present illness. Patient denies fever, chills, headache, sweats, rash, change in vision, change in hearing, chest pain, cough, nausea or vomiting, urinary symptoms. All other systems are reviewed and are negative.    PHYSICAL EXAM: VS: BP 128/84 mmHg  Pulse 113  Ht 5\' 7"  (1.702 m)  Wt 198 lb (89.812 kg)  BMI 31.00 kg/m2 , Patient is oriented to person time and place. Affect is normal. Head is atraumatic. Sclera and conjunctiva are normal. There is no jugulovenous distention. Lungs are clear. Respiratory effort is not labored. Cardiac exam reveals S1 and S2. There is a murmur of his mild aortic stenosis. Abdomen is soft. There is no peripheral edema. There are no musculoskeletal deformities. There are no skin rashes.  EKG: EKG is done today  and reviewed by me. He has old right bundle branch block. There is atrial fibrillation. The rate is 107. The corrected QT interval is prolonged, however this includes the width of his QRS.   Recent Labs: 01/20/2015: ALT 15; BUN 15; Creatinine 1.60*; Hemoglobin 12.0*; Platelets 248; Potassium 4.5; Sodium 140    Lipid Panel  Labs (Brief)       Component Value Date/Time   CHOL  06/14/2010 0640    140  ATP III CLASSIFICATION: <200 mg/dL Desirable 200-239 mg/dL Borderline High >=240 mg/dL High     TRIG 34 06/14/2010 0640   HDL 57 06/14/2010 0640   CHOLHDL 2.5 06/14/2010 0640   VLDL 7 06/14/2010 0640   LDLCALC  06/14/2010 0640    76  Total Cholesterol/HDL:CHD Risk Coronary Heart Disease Risk Table  Men Women 1/2 Average Risk 3.4 3.3 Average Risk 5.0 4.4 2 X Average Risk 9.6 7.1 3 X Average Risk 23.4 11.0   Use the calculated Patient Ratio above and the CHD Risk Table to determine the patient's CHD Risk.   ATP III CLASSIFICATION (LDL): <100 mg/dL Optimal 100-129 mg/dL Near or Above  Optimal 130-159 mg/dL Borderline 160-189 mg/dL High >190 mg/dL Very High       Wt Readings from Last 3 Encounters:  03/10/15 198 lb (89.812 kg)  01/27/15 195 lb 11.2 oz (88.769 kg)  12/16/14 196 lb 6.4 oz (89.086 kg)      Current medicines are reviewed The patient understands his medications.     ASSESSMENT AND PLAN:              Atrial fibrillation - Carlena Bjornstad, MD at 03/10/2015 4:38 PM     Status: Written Related Problem: Atrial fibrillation   Expand All Collapse All   The patient again has return of atrial fibrillation.[name] been in touch with him by telephone over the past week and a half. We have stopped his Dronedarone. Oral amiodarone has been started. We will proceed with  cardioversion on April 27.         Daryel November, MD

## 2015-03-10 NOTE — Progress Notes (Signed)
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 In the past few weeks he had return of atrial fib. He tried to tolerate remaining in atrial fib and felt poorly. We have been trying to decide on the best dose of his beta blocker for his rate control. I decided with him over the telephone to stop his Dranedarone and start amiodarone.. On Thursday, April 21 he started amiodarone 200 mg orally twice daily. He has tolerated this without difficulty. Today he is stable. We had a good discussion and we agree that it is now time to try repeat cardioversion on amiodarone.    Past Medical History  Diagnosis Date  . Coronary artery disease     minimal, cath 2005 / catheterization August, 2011, 30% in 3 vessels, normal LV function  . Hyperlipidemia   . Atrial fibrillation 2011, 2015    Multaq Started September, 2011, dose adjusted October, 2011, 200 mg a.m., 400 mg p.o.  . Monoclonal gammopathy     granfortuna  . TIA (transient ischemic attack)     Dr. Erling Cruz,, question in the past. when off ASA for 10 days  . GERD (gastroesophageal reflux disease)     Esophageal dilatation in 1992, some esophageal spasm  . S/P transurethral resection of prostate 2004    2004  . MR (mitral regurgitation) 2009    mild, Echo, July, 2011  . Cervical spine disease   . Ejection fraction     EF 60%, echo, 2009  /  TEE normal August, 2011 /   EF 60%, echo, July, 2011  . Drug therapy     Pradaxa Started July, 2011  . Aortic stenosis     Mild, echo, July, 2011  . Aortic insufficiency     Mild, echo, July, 2011  . Carotid artery disease     Doppler, 2008  no significant abnormality  . Diarrhea     Chronic  . Incomplete right bundle branch block     August, 2012  . Prostate cancer 11/22/2011  . Painless hematuria 03/27/2012    Occurred x 2 5/12 & 5/13 on Pradaxa 75 mg BID  . Bradycardia     Sinus bradycardia while on 25 Lopressor twice a day October, 2013  . Memory change     June, 2014  . Leg fatigue     June, 2014  . Anemia   . Renal insufficiency     Prior creatinine 1.2 /  September, 2011.. 1.7  /  October, 2011.. 1.8  . Chronic anticoagulation 01/27/2015    pradaxa 75 mg BID for A fib    Past Surgical History  Procedure Laterality Date  . History of turp    . Hernia repair    . Cataract extraction    . Fibular fracture repair    . Cardioversion  08/01/2012    Procedure: CARDIOVERSION;  Surgeon: Carlena Bjornstad, MD;  Location: [facility] ENDOSCOPY;  Service: Cardiovascular;  Laterality: N/A;  . Cardioversion N/A 11/29/2013    Procedure: CARDIOVERSION;  Surgeon: Carlena Bjornstad, MD;  Location: [place];  Service: Cardiovascular;  Laterality: N/A;  . Colonoscopy    . Cardiac catheterization    . Eye surgery Bilateral     cataract extraction  . Shoulder arthroscopy with rotator cuff repair Right 01/23/2014    DR Noemi Chapel   . Shoulder arthroscopy with rotator cuff repair Right 01/23/2014    Procedure: RIGHT SHOULDER ARTHROSCOPY WITH DEBRIDEMENT AND ROTATOR CUFF REPAIR;  Surgeon: Lorn Junes, MD;  Location: [place];  Service: Orthopedics;  Laterality: Right;  . Orif orbital fracture Bilateral 06/22/2014    Procedure: OPEN REDUCTION INTERNAL FIXATION (ORIF) BILATERAL LEFORTE1 FRACTURE OF MAXILLA, HYBRID ARCH BARS ;  Surgeon: Izora Gala, MD;  Location: [place];  Service: ENT;  Laterality: Bilateral;  . Open reduction internal fixation (orif) metacarpal Left 06/22/2014    Procedure: OPEN REDUCTION INTERNAL FIXATION (ORIF) LEFT HAND METACARPAL;  Surgeon: Roseanne Kaufman, MD;  Location: [place];  Service: Orthopedics;  Laterality: Left;  . Cardioversion  N/A 11/27/2014    Procedure: CARDIOVERSION;  Surgeon: Carlena Bjornstad, MD;  Location: [facility] ENDOSCOPY;  Service: Cardiovascular;  Laterality: N/A;    Patient Active Problem List   Diagnosis Date Noted  . Incomplete right bundle branch block     Priority: High  . Atrial fibrillation     Priority: High  . Coronary artery disease     Priority: High  . MR (mitral regurgitation)     Priority: High  . Drug therapy     Priority: High  . Aortic stenosis     Priority: High  . Aortic insufficiency     Priority: High  . Chronic anticoagulation 01/27/2015  . Clavicle fracture 12/16/2014  . LeFort I fracture of maxilla 06/22/2014  . Preop cardiovascular exam 06/19/2014  . Facial fracture due to fall 06/07/2014  . Rotator cuff tear, right 01/23/2014  . GERD (gastroesophageal reflux disease)   . Skin lesion of back 12/24/2013  . Memory change   . Leg fatigue   . Bradycardia   . Carotid artery disease   . Painless hematuria 03/27/2012  . Prostate cancer 11/22/2011  . Hyperlipidemia   . Monoclonal gammopathy   . TIA (transient ischemic attack)   . S/P transurethral resection of prostate   . Cervical spine disease   . Renal insufficiency   . Ejection fraction   . DIARRHEA, CHRONIC 10/14/2010      Current Outpatient Prescriptions  Medication Sig Dispense Refill  . amiodarone (PACERONE) 200 MG tablet Take 1 tablet (200 mg total) by mouth 2 (two) times daily. 60 tablet 3  . aspirin EC 81 MG tablet Take 81 mg by mouth every Monday, Wednesday, and Friday.    Marland Kitchen atorvastatin (LIPITOR) 10 MG tablet Take 1 tablet (10 mg total) by mouth daily. 30 tablet 3  . dabigatran (PRADAXA) 75 MG CAPS capsule Take 1 capsule (75 mg total) by mouth every 12 (twelve) hours. 60 capsule 3  . famotidine (PEPCID) 10 MG tablet Take 10 mg by mouth daily as needed (acid reflux).    . metoprolol (LOPRESSOR) 50 MG tablet Take 1 tablet (50 mg total) by mouth 2 (two) times daily. 180 tablet 3  . naproxen sodium (ANAPROX)  220 MG tablet Take 220 mg by mouth daily as needed (pain). Aleve    . Phenylephrine HCl (NEO-SYNEPHRINE NA) Place 1 spray into both nostrils daily as needed (seasonal allergies).    . Propylene Glycol (SYSTANE BALANCE) 0.6 % SOLN Apply 1-2 drops to eye every 6 (six) hours as needed (dry eyes/irritation).  No current facility-administered medications for this visit.    Allergies:   Guaifenesin & derivatives; Keflex; Chlorhexidine; Oxycodone; Oxycontin; and Penicillins    Social History:  The patient  reports that he has quit smoking. He has never used smokeless tobacco. He reports that he drinks about 1.2 oz of alcohol per week. He reports that he does not use illicit drugs.   Family History:  There is no significant family history of coronary disease.   ROS:  Please see the history of present illness.  Patient denies fever, chills, headache, sweats, rash, change in vision, change in hearing, chest pain, cough, nausea or vomiting, urinary symptoms. All other systems are reviewed and are negative.      PHYSICAL EXAM: VS:  BP 128/84 mmHg  Pulse 113  Ht 5\' 7"  (1.702 m)  Wt 198 lb (89.812 kg)  BMI 31.00 kg/m2 , Patient is oriented to person time and place. Affect is normal. Head is atraumatic. Sclera and conjunctiva are normal. There is no jugulovenous distention. Lungs are clear. Respiratory effort is not labored. Cardiac exam reveals S1 and S2. There is a murmur of his mild aortic stenosis. Abdomen is soft. There is no peripheral edema. There are no musculoskeletal deformities. There are no skin rashes.  EKG:  EKG is done today and reviewed by me. He has old right bundle branch block. There is atrial fibrillation. The rate is 107. The corrected QT interval is prolonged, however this includes the width of his QRS.   Recent Labs: 01/20/2015: ALT 15; BUN 15; Creatinine 1.60*; Hemoglobin 12.0*; Platelets 248; Potassium 4.5; Sodium 140    Lipid Panel    Component Value Date/Time   CHOL   06/14/2010 0640    140        ATP III CLASSIFICATION:  <200     mg/dL   Desirable  200-239  mg/dL   Borderline High  >=240    mg/dL   High          TRIG 34 06/14/2010 0640   HDL 57 06/14/2010 0640   CHOLHDL 2.5 06/14/2010 0640   VLDL 7 06/14/2010 0640   LDLCALC  06/14/2010 0640    76        Total Cholesterol/HDL:CHD Risk Coronary Heart Disease Risk Table                     Men   Women  1/2 Average Risk   3.4   3.3  Average Risk       5.0   4.4  2 X Average Risk   9.6   7.1  3 X Average Risk  23.4   11.0        Use the calculated Patient Ratio above and the CHD Risk Table to determine the patient's CHD Risk.        ATP III CLASSIFICATION (LDL):  <100     mg/dL   Optimal  100-129  mg/dL   Near or Above                    Optimal  130-159  mg/dL   Borderline  160-189  mg/dL   High  >190     mg/dL   Very High      Wt Readings from Last 3 Encounters:  03/10/15 198 lb (89.812 kg)  01/27/15 195 lb 11.2 oz (88.769 kg)  12/16/14 196 lb 6.4 oz (89.086 kg)      Current medicines are reviewed  The patient understands his medications.     ASSESSMENT AND PLAN:

## 2015-03-11 MED ORDER — SODIUM CHLORIDE 0.9 % IV SOLN
INTRAVENOUS | Status: DC
  Administered 2015-03-12: 11:00:00 via INTRAVENOUS

## 2015-03-12 ENCOUNTER — Ambulatory Visit (HOSPITAL_COMMUNITY)
Admission: RE | Admit: 2015-03-12 | Discharge: 2015-03-12 | Disposition: A | Discharge status: Home / Self Care | Payer: Medicare PPO | Source: Ambulatory Visit | Attending: Cardiology | Admitting: Cardiology

## 2015-03-12 ENCOUNTER — Ambulatory Visit (HOSPITAL_COMMUNITY): Payer: Medicare PPO | Admitting: Anesthesiology

## 2015-03-12 ENCOUNTER — Encounter (HOSPITAL_COMMUNITY): Payer: Self-pay

## 2015-03-12 ENCOUNTER — Encounter (HOSPITAL_COMMUNITY)
Admission: RE | Disposition: A | Discharge status: Home / Self Care | Payer: Self-pay | Source: Ambulatory Visit | Attending: Cardiology

## 2015-03-12 DIAGNOSIS — I4891 Unspecified atrial fibrillation: Secondary | ICD-10-CM | POA: Diagnosis not present

## 2015-03-12 DIAGNOSIS — N289 Disorder of kidney and ureter, unspecified: Secondary | ICD-10-CM | POA: Insufficient documentation

## 2015-03-12 DIAGNOSIS — Z87891 Personal history of nicotine dependence: Secondary | ICD-10-CM | POA: Diagnosis not present

## 2015-03-12 DIAGNOSIS — I4510 Unspecified right bundle-branch block: Secondary | ICD-10-CM | POA: Diagnosis not present

## 2015-03-12 DIAGNOSIS — I739 Peripheral vascular disease, unspecified: Secondary | ICD-10-CM | POA: Insufficient documentation

## 2015-03-12 DIAGNOSIS — I2510 Atherosclerotic heart disease of native coronary artery without angina pectoris: Secondary | ICD-10-CM | POA: Insufficient documentation

## 2015-03-12 DIAGNOSIS — R9431 Abnormal electrocardiogram [ECG] [EKG]: Secondary | ICD-10-CM | POA: Insufficient documentation

## 2015-03-12 DIAGNOSIS — I480 Paroxysmal atrial fibrillation: Secondary | ICD-10-CM

## 2015-03-12 DIAGNOSIS — K219 Gastro-esophageal reflux disease without esophagitis: Secondary | ICD-10-CM | POA: Diagnosis not present

## 2015-03-12 HISTORY — PX: CARDIOVERSION: SHX1299

## 2015-03-12 SURGERY — CARDIOVERSION
Anesthesia: General

## 2015-03-12 MED ORDER — LIDOCAINE HCL (CARDIAC) 20 MG/ML IV SOLN
INTRAVENOUS | Status: DC | PRN
  Administered 2015-03-12: 50 mg via INTRAVENOUS

## 2015-03-12 MED ORDER — METOPROLOL TARTRATE 50 MG PO TABS: 25.0000 mg | ORAL_TABLET | Freq: Two times a day (BID) | ORAL | Status: DC

## 2015-03-12 MED ORDER — SODIUM CHLORIDE 0.9 % IV SOLN
INTRAVENOUS | Status: DC
  Administered 2015-03-12: 500 mL via INTRAVENOUS

## 2015-03-12 MED ORDER — PROPOFOL 10 MG/ML IV BOLUS
INTRAVENOUS | Status: DC | PRN
  Administered 2015-03-12: 60 mg via INTRAVENOUS

## 2015-03-12 NOTE — CV Procedure (Signed)
The patient was prepared for cardioversion. Appropriate permits were signed. Timeout was done with everyone appropriately.  Anterior posterior pads were in place with a biphasic defibrillator.  Anesthesia was present. Patient received 50 mg of IV lidocaine and 60 mg of IV propofol.  Patient received one shock with 120 J of aphasic energy. He converted immediately to sinus rhythm with mild sinus bradycardia.   Successful cardioversion. One shock. When he is fully awake he will go home with his daughter. He will remain on his current medications.  Daryel November, MD

## 2015-03-12 NOTE — H&P (Signed)
The patient presents for cardioversion today. The history and physical as recorded from 2 days previously. There is no change in the history. There is no change in the physical exam. The patient is stable and ready for cardioversion today.  Daryel November, MD

## 2015-03-12 NOTE — Discharge Instructions (Signed)
Electrical Cardioversion, Care After °Refer to this sheet in the next few weeks. These instructions provide you with information on caring for yourself after your procedure. Your health care provider may also give you more specific instructions. Your treatment has been planned according to current medical practices, but problems sometimes occur. Call your health care provider if you have any problems or questions after your procedure. °WHAT TO EXPECT AFTER THE PROCEDURE °After your procedure, it is typical to have the following sensations: °· Some redness on the skin where the shocks were delivered. If this is tender, a sunburn lotion or hydrocortisone cream may help. °· Possible return of an abnormal heart rhythm within hours or days after the procedure. °HOME CARE INSTRUCTIONS °· Take medicines only as directed by your health care provider. Be sure you understand how and when to take your medicine. °· Learn how to feel your pulse and check it often. °· Limit your activity for 48 hours after the procedure or as directed by your health care provider. °· Avoid or minimize caffeine and other stimulants as directed by your health care provider. °SEEK MEDICAL CARE IF: °· You feel like your heart is beating too fast or your pulse is not regular. °· You have any questions about your medicines. °· You have bleeding that will not stop. °SEEK IMMEDIATE MEDICAL CARE IF: °· You are dizzy or feel faint. °· It is hard to breathe or you feel short of breath. °· There is a change in discomfort in your chest. °· Your speech is slurred or you have trouble moving an arm or leg on one side of your body. °· You get a serious muscle cramp that does not go away. °· Your fingers or toes turn cold or blue. °Document Released: 08/22/2013 Document Revised: 03/18/2014 Document Reviewed: 08/22/2013 °[employer]® Patient Information ©2015 [employer]. This information is not intended to replace advice given to you by your health care provider.  Make sure you discuss any questions you have with your health care provider. ° ° °Conscious Sedation, Adult, Care After °Refer to this sheet in the next few weeks. These instructions provide you with information on caring for yourself after your procedure. Your health care provider may also give you more specific instructions. Your treatment has been planned according to current medical practices, but problems sometimes occur. Call your health care provider if you have any problems or questions after your procedure. °WHAT TO EXPECT AFTER THE PROCEDURE  °After your procedure: °· You may feel sleepy, clumsy, and have poor balance for several hours. °· Vomiting may occur if you eat too soon after the procedure. °HOME CARE INSTRUCTIONS °· Do not participate in any activities where you could become injured for at least 24 hours. Do not: °¨ Drive. °¨ Swim. °¨ Ride a bicycle. °¨ Operate heavy machinery. °¨ Cook. °¨ Use power tools. °¨ Climb ladders. °¨ Work from a high place. °· Do not make important decisions or sign legal documents until you are improved. °· If you vomit, drink water, juice, or soup when you can drink without vomiting. Make sure you have little or no nausea before eating solid foods. °· Only take over-the-counter or prescription medicines for pain, discomfort, or fever as directed by your health care provider. °· Make sure you and your family fully understand everything about the medicines given to you, including what side effects may occur. °· You should not drink alcohol, take sleeping pills, or take medicines that cause drowsiness for at least 24   hours. °· If you smoke, do not smoke without supervision. °· If you are feeling better, you may resume normal activities 24 hours after you were sedated. °· Keep all appointments with your health care provider. °SEEK MEDICAL CARE IF: °· Your skin is pale or bluish in color. °· You continue to feel nauseous or vomit. °· Your pain is getting worse and is not  helped by medicine. °· You have bleeding or swelling. °· You are still sleepy or feeling clumsy after 24 hours. °SEEK IMMEDIATE MEDICAL CARE IF: °· You develop a rash. °· You have difficulty breathing. °· You develop any type of allergic problem. °· You have a fever. °MAKE SURE YOU: °· Understand these instructions. °· Will watch your condition. °· Will get help right away if you are not doing well or get worse. °Document Released: 08/22/2013 Document Reviewed: 08/22/2013 °Exit[employer]® Patient Information ©2015 [employer]. This information is not intended to replace advice given to you by your health care provider. Make sure you discuss any questions you have with your health care provider. ° °

## 2015-03-12 NOTE — Anesthesia Preprocedure Evaluation (Addendum)
Anesthesia Evaluation  Patient identified by MRN, date of birth, ID band Patient awake    Reviewed: Allergy & Precautions, NPO status , Patient's Chart, lab work & pertinent test results  History of Anesthesia Complications Negative for: history of anesthetic complications  Airway Mallampati: II  TM Distance: >3 FB Neck ROM: Full    Dental  (+) Teeth Intact, Dental Advisory Given   Pulmonary former smoker,  breath sounds clear to auscultation        Cardiovascular + CAD and + Peripheral Vascular Disease + dysrhythmias Rhythm:Irregular Rate:Abnormal  EF 60%, echo, 2009 / TEE normal August, 2011 /  EF 60%, echo, July, 2011   Neuro/Psych TIAnegative psych ROS   GI/Hepatic Neg liver ROS, GERD-  ,  Endo/Other    Renal/GU Renal disease     Musculoskeletal   Abdominal   Peds  Hematology  (+) anemia ,   Anesthesia Other Findings   Reproductive/Obstetrics                           Anesthesia Physical  Anesthesia Plan  ASA: III  Anesthesia Plan: General   Post-op Pain Management:    Induction: Intravenous  Airway Management Planned: Mask  Additional Equipment:   Intra-op Plan:   Post-operative Plan:   Informed Consent: I have reviewed the patients History and Physical, chart, labs and discussed the procedure including the risks, benefits and alternatives for the proposed anesthesia with the patient or authorized representative who has indicated his/her understanding and acceptance.   Dental advisory given  Plan Discussed with: CRNA  Anesthesia Plan Comments:         Anesthesia Quick Evaluation

## 2015-03-12 NOTE — Transfer of Care (Signed)
Immediate Anesthesia Transfer of Care Note  Patient: [name] Scull, MD  Procedure(s) Performed: Procedure(s): CARDIOVERSION (N/A)  Patient Location: Endoscopy Unit  Anesthesia Type:MAC  Level of Consciousness: awake, alert , oriented and patient cooperative  Airway & Oxygen Therapy: Patient Spontanous Breathing and Patient connected to nasal cannula oxygen  Post-op Assessment: Report given to RN and Post -op Vital signs reviewed and stable  Post vital signs: Reviewed  Last Vitals:  Filed Vitals:   03/12/15 1037  BP: 141/96  Pulse: 105  Temp:   Resp: 18    Complications: No apparent anesthesia complications

## 2015-03-12 NOTE — Anesthesia Postprocedure Evaluation (Signed)
Anesthesia Post Note  Patient: [name] Scull, MD  Procedure(s) Performed: Procedure(s) (LRB): CARDIOVERSION (N/A)  Anesthesia type: General  Patient location: PACU  Post pain: Pain level controlled  Post assessment: Post-op Vital signs reviewed  Last Vitals: BP 118/57 mmHg  Pulse 48  Temp(Src) 36.7 C (Oral)  Resp 15  Ht 5\' 7"  (1.702 m)  Wt 198 lb (89.812 kg)  BMI 31.00 kg/m2  SpO2 99%  Post vital signs: Reviewed  Level of consciousness: sedated  Complications: No apparent anesthesia complications

## 2015-03-13 ENCOUNTER — Encounter (HOSPITAL_COMMUNITY): Payer: Self-pay | Admitting: Cardiology

## 2015-03-14 ENCOUNTER — Telehealth: Payer: Self-pay

## 2015-03-14 NOTE — Telephone Encounter (Signed)
The pt had a cardioversion on 4/27. Per Dr Ron Parker I called the pt and asked the following questions: 1. How is he feeling? The pt states that he feels a fullness in his head when he first stands up and that it subsides soon after. He says that he "feels ok but not chipper".  2. Post cardioversion Appt. The pt and I scheduled his post cardioversion F/u with Dr Ron Parker on 03/21/15 at 9 am.  3. What is his HR and Medications he is taking: The pt states that his HR is regular at 64 at this time. He states that he is taking Metoprolol 25 mg BID and Amiodarone 200 mg BID.  Dr Ron Parker is aware of all of the above.

## 2015-03-21 ENCOUNTER — Encounter: Payer: Self-pay | Admitting: Cardiology

## 2015-03-21 ENCOUNTER — Ambulatory Visit (INDEPENDENT_AMBULATORY_CARE_PROVIDER_SITE_OTHER): Payer: Medicare PPO | Admitting: Cardiology

## 2015-03-21 VITALS — BP 132/72 | HR 51 | Ht 67.0 in | Wt 197.6 lb

## 2015-03-21 DIAGNOSIS — I480 Paroxysmal atrial fibrillation: Secondary | ICD-10-CM

## 2015-03-21 DIAGNOSIS — I351 Nonrheumatic aortic (valve) insufficiency: Secondary | ICD-10-CM | POA: Diagnosis not present

## 2015-03-21 DIAGNOSIS — R600 Localized edema: Secondary | ICD-10-CM | POA: Diagnosis not present

## 2015-03-21 DIAGNOSIS — R001 Bradycardia, unspecified: Secondary | ICD-10-CM | POA: Diagnosis not present

## 2015-03-21 MED ORDER — AMIODARONE HCL 200 MG PO TABS: 200.0000 mg | ORAL_TABLET | Freq: Every day | ORAL | Status: DC

## 2015-03-21 NOTE — Progress Notes (Signed)
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 On that day he had been on amiodarone 400 mg daily for approximately one week. He was cardioverted as an outpatient at the hospital with one shock. He's doing very well. He's holding sinus rhythm. He's been watching his pulse. It is rate is less than 60 he has not been taking his beta blocker dose. He feels better when his heart rate is higher. He has not had any chest pain, syncope or presyncope.    Past Medical History  Diagnosis Date  . Coronary artery disease     minimal, cath 2005 / catheterization August, 2011, 30% in 3 vessels, normal LV function  . Hyperlipidemia   . Atrial fibrillation 2011, 2015    Multaq Started September, 2011, dose adjusted October, 2011, 200 mg a.m., 400 mg p.o.  . Monoclonal gammopathy     granfortuna  . TIA (transient ischemic attack)     Dr. Erling Cruz,, question in the past. when off ASA for 10 days  . GERD (gastroesophageal reflux disease)     Esophageal dilatation in 1992, some esophageal spasm  . S/P transurethral resection of prostate 2004    2004  . MR (mitral regurgitation) 2009    mild, Echo, July, 2011  . Cervical spine disease   . Ejection fraction     EF 60%, echo, 2009  /  TEE normal August, 2011 /   EF 60%, echo, July, 2011  . Drug therapy     Pradaxa Started July, 2011  . Aortic stenosis     Mild, echo, July, 2011  . Aortic insufficiency     Mild, echo, July, 2011  . Carotid artery disease     Doppler, 2008 no significant abnormality  . Diarrhea     Chronic  . Incomplete right bundle branch  block     August, 2012  . Prostate cancer 11/22/2011  . Painless hematuria 03/27/2012    Occurred x 2 5/12 & 5/13 on Pradaxa 75 mg BID  . Bradycardia     Sinus bradycardia while on 25 Lopressor twice a day October, 2013  . Memory change     June, 2014  . Leg fatigue     June, 2014  . Anemia   . Renal insufficiency     Prior creatinine 1.2 /  September, 2011.. 1.7  /  October, 2011.. 1.8  . Chronic anticoagulation 01/27/2015    pradaxa 75 mg BID for A fib    Past Surgical History  Procedure Laterality Date  . History of turp    . Hernia repair    . Cataract extraction    . Fibular fracture repair    . Cardioversion  08/01/2012    Procedure: CARDIOVERSION;  Surgeon: Carlena Bjornstad, MD;  Location: [facility] ENDOSCOPY;  Service: Cardiovascular;  Laterality: N/A;  . Cardioversion N/A 11/29/2013    Procedure: CARDIOVERSION;  Surgeon: Carlena Bjornstad, MD;  Location: [facility] ENDOSCOPY;  Service: Cardiovascular;  Laterality: N/A;  . Colonoscopy    . Cardiac catheterization    .  Eye surgery Bilateral     cataract extraction  . Shoulder arthroscopy with rotator cuff repair Right 01/23/2014    DR Noemi Chapel   . Shoulder arthroscopy with rotator cuff repair Right 01/23/2014    Procedure: RIGHT SHOULDER ARTHROSCOPY WITH DEBRIDEMENT AND ROTATOR CUFF REPAIR;  Surgeon: Lorn Junes, MD;  Location: [place];  Service: Orthopedics;  Laterality: Right;  . Orif orbital fracture Bilateral 06/22/2014    Procedure: OPEN REDUCTION INTERNAL FIXATION (ORIF) BILATERAL LEFORTE1 FRACTURE OF MAXILLA, HYBRID ARCH BARS ;  Surgeon: Izora Gala, MD;  Location: [place];  Service: ENT;  Laterality: Bilateral;  . Open reduction internal fixation (orif) metacarpal Left 06/22/2014    Procedure: OPEN REDUCTION INTERNAL FIXATION (ORIF) LEFT HAND METACARPAL;  Surgeon: Roseanne Kaufman, MD;  Location: [place];  Service: Orthopedics;  Laterality: Left;  . Cardioversion N/A 11/27/2014    Procedure: CARDIOVERSION;  Surgeon: Carlena Bjornstad, MD;  Location: [facility]  ENDOSCOPY;  Service: Cardiovascular;  Laterality: N/A;  . Cardioversion N/A 03/12/2015    Procedure: CARDIOVERSION;  Surgeon: Carlena Bjornstad, MD;  Location: [facility] ENDOSCOPY;  Service: Cardiovascular;  Laterality: N/A;    Patient Active Problem List   Diagnosis Date Noted  . Incomplete right bundle branch block     Priority: High  . Atrial fibrillation     Priority: High  . Coronary artery disease     Priority: High  . MR (mitral regurgitation)     Priority: High  . Drug therapy     Priority: High  . Aortic stenosis     Priority: High  . Aortic insufficiency     Priority: High  . Chronic anticoagulation 01/27/2015  . Clavicle fracture 12/16/2014  . LeFort I fracture of maxilla 06/22/2014  . Preop cardiovascular exam 06/19/2014  . Facial fracture due to fall 06/07/2014  . Rotator cuff tear, right 01/23/2014  . GERD (gastroesophageal reflux disease)   . Skin lesion of back 12/24/2013  . Memory change   . Leg fatigue   . Bradycardia   . Carotid artery disease   . Painless hematuria 03/27/2012  . Prostate cancer 11/22/2011  . Hyperlipidemia   . Monoclonal gammopathy   . TIA (transient ischemic attack)   . S/P transurethral resection of prostate   . Cervical spine disease   . Renal insufficiency   . Ejection fraction   . DIARRHEA, CHRONIC 10/14/2010      Current Outpatient Prescriptions  Medication Sig Dispense Refill  . aspirin EC 81 MG tablet Take 81 mg by mouth every Monday, Wednesday, and Friday.    Marland Kitchen atorvastatin (LIPITOR) 10 MG tablet Take 10 mg by mouth daily.     . dabigatran (PRADAXA) 75 MG CAPS capsule Take 1 capsule (75 mg total) by mouth every 12 (twelve) hours. 60 capsule 3  . famotidine (PEPCID) 10 MG tablet Take 10 mg by mouth daily as needed (acid reflux).    . naproxen sodium (ANAPROX) 220 MG tablet Take 220 mg by mouth daily as needed (pain). Aleve    . Phenylephrine HCl (NEO-SYNEPHRINE NA) Place 1 spray into both nostrils daily as needed (seasonal  allergies).     No current facility-administered medications for this visit.    Allergies:   Chlorhexidine; Guaifenesin & derivatives; Keflex; Oxycodone; Oxycontin; and Penicillins    Social History:  The patient  reports that he has quit smoking. He has never used smokeless tobacco. He reports that he drinks about 1.2 oz of alcohol per week. He reports that he  does not use illicit drugs.   Family History:  The patient's family history includes Kidney failure in his father; Stroke in his mother.    ROS:  Please see the history of present illness.    Patient denies fever, chills, headache, sweats, rash, change in vision, change in hearing, chest pain, cough, nausea or vomiting, urinary symptoms. He mentions slight dependent edema in his right leg. All other systems are reviewed and are negative.    PHYSICAL EXAM: VS:  BP 132/72 mmHg  Pulse 51  Ht 5\' 7"  (1.702 m)  Wt 197 lb 9.6 oz (89.631 kg)  BMI 30.94 kg/m2 , Patient is oriented to person time and place. Affect is normal. Head is atraumatic. Sclera and conjunctiva are normal. There is no jugular venous distention. Lungs are clear. Respiratory effort is unlabored. Cardiac exam reveals S1 and S2. He has a murmur of mild aortic stenosis. The abdomen is soft. He has trace peripheral edema above his sock line on his right leg. There are no musculoskeletal deformities. There are no skin rashes.  EKG:   EKG is done today and reviewed by me. He has old right bundle branch block with left axis. There is sinus bradycardia with a rate of 51.   Recent Labs: 01/20/2015: ALT 15 03/10/2015: BUN 19; Creatinine 1.65*; Hemoglobin 11.3*; Platelets 213.0; Potassium 4.5; Sodium 135    Lipid Panel    Component Value Date/Time   CHOL  06/14/2010 0640    140        ATP III CLASSIFICATION:  <200     mg/dL   Desirable  200-239  mg/dL   Borderline High  >=240    mg/dL   High          TRIG 34 06/14/2010 0640   HDL 57 06/14/2010 0640   CHOLHDL 2.5  06/14/2010 0640   VLDL 7 06/14/2010 0640   LDLCALC  06/14/2010 0640    76        Total Cholesterol/HDL:CHD Risk Coronary Heart Disease Risk Table                     Men   Women  1/2 Average Risk   3.4   3.3  Average Risk       5.0   4.4  2 X Average Risk   9.6   7.1  3 X Average Risk  23.4   11.0        Use the calculated Patient Ratio above and the CHD Risk Table to determine the patient's CHD Risk.        ATP III CLASSIFICATION (LDL):  <100     mg/dL   Optimal  100-129  mg/dL   Near or Above                    Optimal  130-159  mg/dL   Borderline  160-189  mg/dL   High  >190     mg/dL   Very High      Wt Readings from Last 3 Encounters:  03/21/15 197 lb 9.6 oz (89.631 kg)  03/12/15 198 lb (89.812 kg)  03/10/15 198 lb (89.812 kg)      Current medicines are reviewed  The patient understands his medicines well.     ASSESSMENT AND PLAN:

## 2015-03-21 NOTE — Patient Instructions (Signed)
Medication Instructions:  Stop taking Metoprolol and decrease Amiodarone to 200 mg once daily  Labwork: None  Testing/Procedures: None  Follow-Up: Your physician recommends that you schedule a follow-up appointment on: April 28, 2015

## 2015-03-21 NOTE — Assessment & Plan Note (Signed)
Today we are stopping his beta blocker and lowering the dose of his amiodarone.

## 2015-03-21 NOTE — Assessment & Plan Note (Signed)
Patient is doing well after his most recent cardioversion. He has now been on amiodarone 400 mg daily for 2 weeks. His QT interval is normal. The dose will be lowered to 200 mg daily. His rate continues to be slow even on low-dose beta-blockade. His beta blocker will be stopped completely today. Also with a lower dose of amiodarone, I'm hoping that his rate will remain at least in the 60s.

## 2015-04-03 ENCOUNTER — Other Ambulatory Visit: Payer: Self-pay | Admitting: Oncology

## 2015-04-03 DIAGNOSIS — R270 Ataxia, unspecified: Secondary | ICD-10-CM

## 2015-04-03 DIAGNOSIS — R278 Other lack of coordination: Secondary | ICD-10-CM

## 2015-04-15 ENCOUNTER — Other Ambulatory Visit: Payer: Self-pay

## 2015-04-15 MED ORDER — ATORVASTATIN CALCIUM 10 MG PO TABS: 10.0000 mg | ORAL_TABLET | Freq: Every day | ORAL | Status: DC

## 2015-04-16 ENCOUNTER — Ambulatory Visit (INDEPENDENT_AMBULATORY_CARE_PROVIDER_SITE_OTHER): Payer: Medicare PPO | Admitting: Cardiology

## 2015-04-16 ENCOUNTER — Encounter: Payer: Self-pay | Admitting: Cardiology

## 2015-04-16 VITALS — BP 138/84 | HR 57 | Ht 67.0 in | Wt 193.6 lb

## 2015-04-16 DIAGNOSIS — Z79899 Other long term (current) drug therapy: Secondary | ICD-10-CM | POA: Insufficient documentation

## 2015-04-16 DIAGNOSIS — I350 Nonrheumatic aortic (valve) stenosis: Secondary | ICD-10-CM | POA: Diagnosis not present

## 2015-04-16 DIAGNOSIS — I4510 Unspecified right bundle-branch block: Secondary | ICD-10-CM | POA: Insufficient documentation

## 2015-04-16 DIAGNOSIS — Z7901 Long term (current) use of anticoagulants: Secondary | ICD-10-CM

## 2015-04-16 DIAGNOSIS — I480 Paroxysmal atrial fibrillation: Secondary | ICD-10-CM

## 2015-04-16 NOTE — Assessment & Plan Note (Signed)
His last echo was October, 2013. He had significant leaflet thickening. He had only mild aortic stenosis. I will call him back to discuss whether we want to proceed with a follow-up echo now.

## 2015-04-16 NOTE — Assessment & Plan Note (Signed)
He is appropriately anticoagulated.

## 2015-04-16 NOTE — Progress Notes (Signed)
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 He underwent cardioversion March 12, 2015. Amiodarone has been continued. He is now on 200 mg daily. His resting heart rate was slow and his beta blocker was stopped completely. He is doing better. He has played 18 holes of golf on 3 occasions. He is very careful with his ambulation to be sure that he does not fall. I'm very pleased with his overall status.    Past Medical History  Diagnosis Date  . Coronary artery disease     minimal, cath 2005 / catheterization August, 2011, 30% in 3 vessels, normal LV function  . Hyperlipidemia   . Atrial fibrillation 2011, 2015    Multaq Started September, 2011, dose adjusted October, 2011, 200 mg a.m., 400 mg p.o.  . Monoclonal gammopathy     granfortuna  . TIA (transient ischemic attack)     Dr. Erling Cruz,, question in the past. when off ASA for 10 days  . GERD (gastroesophageal reflux disease)     Esophageal dilatation in 1992, some esophageal spasm  . S/P transurethral resection of prostate 2004    2004  . MR (mitral regurgitation) 2009    mild, Echo, July, 2011  . Cervical spine disease   . Ejection fraction     EF 60%, echo, 2009  /  TEE normal August, 2011 /   EF 60%, echo, July, 2011  . Drug therapy     Pradaxa Started July, 2011  . Aortic stenosis     Mild, echo, July, 2011  . Aortic insufficiency     Mild, echo, July, 2011  . Carotid artery disease     Doppler, 2008 no significant abnormality  . Diarrhea     Chronic  . Incomplete right bundle branch block     August, 2012  . Prostate cancer 11/22/2011  . Painless hematuria  03/27/2012    Occurred x 2 5/12 & 5/13 on Pradaxa 75 mg BID  . Bradycardia     Sinus bradycardia while on 25 Lopressor twice a day October, 2013  . Memory change     June, 2014  . Leg fatigue     June, 2014  . Anemia   . Renal insufficiency     Prior creatinine 1.2 /  September, 2011.. 1.7  /  October, 2011.. 1.8  . Chronic anticoagulation 01/27/2015    pradaxa 75 mg BID for A fib    Past Surgical History  Procedure Laterality Date  . History of turp    . Hernia repair    . Cataract extraction    . Fibular fracture repair    . Cardioversion  08/01/2012    Procedure: CARDIOVERSION;  Surgeon: Carlena Bjornstad, MD;  Location: [facility] ENDOSCOPY;  Service: Cardiovascular;  Laterality: N/A;  . Cardioversion N/A 11/29/2013    Procedure: CARDIOVERSION;  Surgeon: Carlena Bjornstad, MD;  Location: [facility] ENDOSCOPY;  Service: Cardiovascular;  Laterality: N/A;  . Colonoscopy    . Cardiac catheterization    . Eye surgery Bilateral     cataract extraction  . Shoulder arthroscopy with  rotator cuff repair Right 01/23/2014    DR Noemi Chapel   . Shoulder arthroscopy with rotator cuff repair Right 01/23/2014    Procedure: RIGHT SHOULDER ARTHROSCOPY WITH DEBRIDEMENT AND ROTATOR CUFF REPAIR;  Surgeon: Lorn Junes, MD;  Location: [place];  Service: Orthopedics;  Laterality: Right;  . Orif orbital fracture Bilateral 06/22/2014    Procedure: OPEN REDUCTION INTERNAL FIXATION (ORIF) BILATERAL LEFORTE1 FRACTURE OF MAXILLA, HYBRID ARCH BARS ;  Surgeon: Izora Gala, MD;  Location: [place];  Service: ENT;  Laterality: Bilateral;  . Open reduction internal fixation (orif) metacarpal Left 06/22/2014    Procedure: OPEN REDUCTION INTERNAL FIXATION (ORIF) LEFT HAND METACARPAL;  Surgeon: Roseanne Kaufman, MD;  Location: [place];  Service: Orthopedics;  Laterality: Left;  . Cardioversion N/A 11/27/2014    Procedure: CARDIOVERSION;  Surgeon: Carlena Bjornstad, MD;  Location: [facility] ENDOSCOPY;  Service: Cardiovascular;  Laterality: N/A;  . Cardioversion  N/A 03/12/2015    Procedure: CARDIOVERSION;  Surgeon: Carlena Bjornstad, MD;  Location: [facility] ENDOSCOPY;  Service: Cardiovascular;  Laterality: N/A;    Patient Active Problem List   Diagnosis Date Noted  . Incomplete right bundle branch block     Priority: High  . Atrial fibrillation     Priority: High  . Coronary artery disease     Priority: High  . MR (mitral regurgitation)     Priority: High  . Drug therapy     Priority: High  . Aortic stenosis     Priority: High  . Aortic insufficiency     Priority: High  . Edema leg 03/21/2015  . Chronic anticoagulation 01/27/2015  . Clavicle fracture 12/16/2014  . LeFort I fracture of maxilla 06/22/2014  . Preop cardiovascular exam 06/19/2014  . Facial fracture due to fall 06/07/2014  . Rotator cuff tear, right 01/23/2014  . GERD (gastroesophageal reflux disease)   . Skin lesion of back 12/24/2013  . Memory change   . Leg fatigue   . Bradycardia   . Carotid artery disease   . Painless hematuria 03/27/2012  . Prostate cancer 11/22/2011  . Hyperlipidemia   . Monoclonal gammopathy   . TIA (transient ischemic attack)   . S/P transurethral resection of prostate   . Cervical spine disease   . Renal insufficiency   . Ejection fraction   . DIARRHEA, CHRONIC 10/14/2010      Current Outpatient Prescriptions  Medication Sig Dispense Refill  . amiodarone (PACERONE) 200 MG tablet Take 1 tablet (200 mg total) by mouth daily. 30 tablet 3  . aspirin EC 81 MG tablet Take 81 mg by mouth every Monday, Wednesday, and Friday.    Marland Kitchen atorvastatin (LIPITOR) 10 MG tablet Take 1 tablet (10 mg total) by mouth daily. 30 tablet 11  . dabigatran (PRADAXA) 75 MG CAPS capsule Take 1 capsule (75 mg total) by mouth every 12 (twelve) hours. 60 capsule 3  . famotidine (PEPCID) 10 MG tablet Take 10 mg by mouth daily as needed (acid reflux).    . naproxen sodium (ANAPROX) 220 MG tablet Take 220 mg by mouth daily as needed (pain). Aleve    . Phenylephrine HCl  (NEO-SYNEPHRINE NA) Place 1 spray into both nostrils daily as needed (seasonal allergies).     No current facility-administered medications for this visit.    Allergies:   Chlorhexidine; Guaifenesin & derivatives; Keflex; Oxycodone; Oxycontin; and Penicillins    Social History:  The patient  reports that he has quit smoking. He has never used smokeless tobacco. He reports that  he drinks about 1.2 oz of alcohol per week. He reports that he does not use illicit drugs.   Family History:  The patient's family history includes Kidney failure in his father; Stroke in his mother.    ROS:  Please see the history of present illness.     Patient denies fever, chills, headache, sweats, rash, change in vision, change in hearing, chest pain, cough, nausea or vomiting, urinary symptoms. All other systems are reviewed and are negative.   PHYSICAL EXAM: VS:  BP 138/84 mmHg  Pulse 57  Ht 5\' 7"  (1.702 m)  Wt 193 lb 9.6 oz (87.816 kg)  BMI 30.31 kg/m2  SpO2 98% , Patient is oriented to person time and place. Affect is normal. Head is atraumatic. Sclera and conjunctiva are normal. There is no jugular venous distention. Lungs are clear. Respiratory effort is not labored. Cardiac exam reveals a crescendo decrescendo systolic murmur consistent with his aortic stenosis. The abdomen is soft. There is no peripheral edema. There are no musculoskeletal deformities. There are no skin rashes.  EKG:   EKG is done today and reviewed by me. There is sinus rhythm. There is old right bundle branch block with left axis. The corrected QT interval is 443 ms.   Recent Labs: 01/20/2015: ALT 15 03/10/2015: BUN 19; Creatinine 1.65*; Hemoglobin 11.3*; Platelets 213.0; Potassium 4.5; Sodium 135    Lipid Panel    Component Value Date/Time   CHOL  06/14/2010 0640    140        ATP III CLASSIFICATION:  <200     mg/dL   Desirable  200-239  mg/dL   Borderline High  >=240    mg/dL   High          TRIG 34 06/14/2010 0640    HDL 57 06/14/2010 0640   CHOLHDL 2.5 06/14/2010 0640   VLDL 7 06/14/2010 0640   LDLCALC  06/14/2010 0640    76        Total Cholesterol/HDL:CHD Risk Coronary Heart Disease Risk Table                     Men   Women  1/2 Average Risk   3.4   3.3  Average Risk       5.0   4.4  2 X Average Risk   9.6   7.1  3 X Average Risk  23.4   11.0        Use the calculated Patient Ratio above and the CHD Risk Table to determine the patient's CHD Risk.        ATP III CLASSIFICATION (LDL):  <100     mg/dL   Optimal  100-129  mg/dL   Near or Above                    Optimal  130-159  mg/dL   Borderline  160-189  mg/dL   High  >190     mg/dL   Very High      Wt Readings from Last 3 Encounters:  04/16/15 193 lb 9.6 oz (87.816 kg)  03/21/15 197 lb 9.6 oz (89.631 kg)  03/12/15 198 lb (89.812 kg)      Current medicines are reviewed  . The patient understands his medications.     ASSESSMENT AND PLAN:

## 2015-04-16 NOTE — Patient Instructions (Signed)
**Note De-Identified  Obfuscation** Medication Instructions:  Same-no change  Labwork: None  Testing/Procedures: None  Follow up: Your physician recommends that you schedule a follow-up appointment in: 3 months

## 2015-04-16 NOTE — Assessment & Plan Note (Signed)
For now the patient be kept on 200 mg of oral amiodarone.Marland Kitchen

## 2015-04-16 NOTE — Assessment & Plan Note (Signed)
He is tolerating 200 mg of amiodarone. His QT interval is normal. I will continue this dose for now.

## 2015-04-18 ENCOUNTER — Encounter: Payer: Self-pay | Admitting: Cardiology

## 2015-04-24 ENCOUNTER — Other Ambulatory Visit: Payer: Self-pay | Admitting: Oncology

## 2015-04-24 ENCOUNTER — Other Ambulatory Visit: Payer: Self-pay

## 2015-04-24 DIAGNOSIS — Z7901 Long term (current) use of anticoagulants: Secondary | ICD-10-CM

## 2015-04-24 DIAGNOSIS — C61 Malignant neoplasm of prostate: Secondary | ICD-10-CM

## 2015-04-24 DIAGNOSIS — Z79899 Other long term (current) drug therapy: Secondary | ICD-10-CM

## 2015-04-24 DIAGNOSIS — D472 Monoclonal gammopathy: Secondary | ICD-10-CM

## 2015-04-24 DIAGNOSIS — N289 Disorder of kidney and ureter, unspecified: Secondary | ICD-10-CM

## 2015-04-24 DIAGNOSIS — I482 Chronic atrial fibrillation, unspecified: Secondary | ICD-10-CM

## 2015-04-24 MED ORDER — DABIGATRAN ETEXILATE MESYLATE 75 MG PO CAPS: 75.0000 mg | ORAL_CAPSULE | Freq: Two times a day (BID) | ORAL | Status: DC

## 2015-04-24 NOTE — Telephone Encounter (Signed)
Per note 6.1.16 

## 2015-04-28 ENCOUNTER — Ambulatory Visit: Payer: Medicare PPO | Admitting: Cardiology

## 2015-05-06 NOTE — Addendum Note (Signed)
Addended by: Gust Brooms A on: 05/06/2015 07:48 AM   Modules accepted: Orders

## 2015-05-12 ENCOUNTER — Other Ambulatory Visit: Payer: Self-pay

## 2015-05-14 ENCOUNTER — Other Ambulatory Visit (INDEPENDENT_AMBULATORY_CARE_PROVIDER_SITE_OTHER): Payer: Medicare PPO

## 2015-05-14 DIAGNOSIS — D472 Monoclonal gammopathy: Secondary | ICD-10-CM

## 2015-05-14 DIAGNOSIS — Z79899 Other long term (current) drug therapy: Secondary | ICD-10-CM

## 2015-05-14 DIAGNOSIS — Z7901 Long term (current) use of anticoagulants: Secondary | ICD-10-CM

## 2015-05-14 DIAGNOSIS — C61 Malignant neoplasm of prostate: Secondary | ICD-10-CM

## 2015-05-14 DIAGNOSIS — N289 Disorder of kidney and ureter, unspecified: Secondary | ICD-10-CM

## 2015-05-14 DIAGNOSIS — I482 Chronic atrial fibrillation, unspecified: Secondary | ICD-10-CM

## 2015-05-14 LAB — CBC WITH DIFFERENTIAL/PLATELET
BASOS ABS: 0 10*3/uL (ref 0.0–0.1)
BASOS PCT: 0 % (ref 0–1)
EOS ABS: 0.1 10*3/uL (ref 0.0–0.7)
Eosinophils Relative: 2 % (ref 0–5)
HCT: 39.7 % (ref 39.0–52.0)
Hemoglobin: 13 g/dL (ref 13.0–17.0)
Lymphocytes Relative: 37 % (ref 12–46)
Lymphs Abs: 2.7 10*3/uL (ref 0.7–4.0)
MCH: 32.7 pg (ref 26.0–34.0)
MCHC: 32.7 g/dL (ref 30.0–36.0)
MCV: 100 fL (ref 78.0–100.0)
MONO ABS: 0.8 10*3/uL (ref 0.1–1.0)
MPV: 9.5 fL (ref 8.6–12.4)
Monocytes Relative: 11 % (ref 3–12)
Neutro Abs: 3.6 10*3/uL (ref 1.7–7.7)
Neutrophils Relative %: 50 % (ref 43–77)
PLATELETS: 275 10*3/uL (ref 150–400)
RBC: 3.97 MIL/uL — ABNORMAL LOW (ref 4.22–5.81)
RDW: 15.1 % (ref 11.5–15.5)
WBC: 7.2 10*3/uL (ref 4.0–10.5)

## 2015-05-14 LAB — COMPREHENSIVE METABOLIC PANEL
ALBUMIN: 3.8 g/dL (ref 3.5–5.2)
ALK PHOS: 48 U/L (ref 39–117)
ALT: 14 U/L (ref 0–53)
AST: 19 U/L (ref 0–37)
BILIRUBIN TOTAL: 0.7 mg/dL (ref 0.2–1.2)
BUN: 17 mg/dL (ref 6–23)
CO2: 25 mEq/L (ref 19–32)
CREATININE: 1.53 mg/dL — AB (ref 0.50–1.35)
Calcium: 9.6 mg/dL (ref 8.4–10.5)
Chloride: 104 mEq/L (ref 96–112)
GLUCOSE: 95 mg/dL (ref 70–99)
POTASSIUM: 4.5 meq/L (ref 3.5–5.3)
Sodium: 139 mEq/L (ref 135–145)
Total Protein: 8 g/dL (ref 6.0–8.3)

## 2015-05-15 LAB — IGG, IGA, IGM
IGA: 111 mg/dL (ref 68–379)
IGG (IMMUNOGLOBIN G), SERUM: 2450 mg/dL — AB (ref 650–1600)
IgM, Serum: 91 mg/dL (ref 41–251)

## 2015-05-15 LAB — KAPPA/LAMBDA LIGHT CHAINS
KAPPA FREE LGHT CHN: 6.37 mg/dL — AB (ref 0.33–1.94)
Kappa:Lambda Ratio: 5.69 — ABNORMAL HIGH (ref 0.26–1.65)
Lambda Free Lght Chn: 1.12 mg/dL (ref 0.57–2.63)

## 2015-05-15 LAB — PSA: PSA: 3.6 ng/mL (ref <=4.00)

## 2015-05-20 ENCOUNTER — Encounter: Payer: Self-pay | Admitting: Oncology

## 2015-05-20 ENCOUNTER — Ambulatory Visit (INDEPENDENT_AMBULATORY_CARE_PROVIDER_SITE_OTHER): Payer: Medicare PPO | Admitting: Oncology

## 2015-05-20 VITALS — BP 173/73 | HR 55 | Temp 98.3°F | Resp 20 | Ht 66.0 in | Wt 195.4 lb

## 2015-05-20 DIAGNOSIS — Z8546 Personal history of malignant neoplasm of prostate: Secondary | ICD-10-CM | POA: Diagnosis not present

## 2015-05-20 DIAGNOSIS — D472 Monoclonal gammopathy: Secondary | ICD-10-CM | POA: Diagnosis not present

## 2015-05-20 DIAGNOSIS — Z87448 Personal history of other diseases of urinary system: Secondary | ICD-10-CM

## 2015-05-20 DIAGNOSIS — Z7901 Long term (current) use of anticoagulants: Secondary | ICD-10-CM

## 2015-05-20 DIAGNOSIS — M479 Spondylosis, unspecified: Secondary | ICD-10-CM

## 2015-05-20 DIAGNOSIS — Z85828 Personal history of other malignant neoplasm of skin: Secondary | ICD-10-CM

## 2015-05-20 DIAGNOSIS — G250 Essential tremor: Secondary | ICD-10-CM

## 2015-05-20 DIAGNOSIS — K219 Gastro-esophageal reflux disease without esophagitis: Secondary | ICD-10-CM

## 2015-05-20 DIAGNOSIS — I350 Nonrheumatic aortic (valve) stenosis: Secondary | ICD-10-CM

## 2015-05-20 DIAGNOSIS — Z8781 Personal history of (healed) traumatic fracture: Secondary | ICD-10-CM

## 2015-05-20 DIAGNOSIS — I2510 Atherosclerotic heart disease of native coronary artery without angina pectoris: Secondary | ICD-10-CM

## 2015-05-20 DIAGNOSIS — Z9889 Other specified postprocedural states: Secondary | ICD-10-CM

## 2015-05-20 DIAGNOSIS — I4891 Unspecified atrial fibrillation: Secondary | ICD-10-CM

## 2015-05-20 DIAGNOSIS — C61 Malignant neoplasm of prostate: Secondary | ICD-10-CM

## 2015-05-20 DIAGNOSIS — Z79899 Other long term (current) drug therapy: Secondary | ICD-10-CM

## 2015-05-20 DIAGNOSIS — E785 Hyperlipidemia, unspecified: Secondary | ICD-10-CM

## 2015-05-20 DIAGNOSIS — R2681 Unsteadiness on feet: Secondary | ICD-10-CM

## 2015-05-20 DIAGNOSIS — Z7982 Long term (current) use of aspirin: Secondary | ICD-10-CM

## 2015-05-20 DIAGNOSIS — Z8673 Personal history of transient ischemic attack (TIA), and cerebral infarction without residual deficits: Secondary | ICD-10-CM

## 2015-05-20 NOTE — Progress Notes (Signed)
Patient ID: [name] Scull, MD, male   DOB: 01/23/1925, 81 y.o.   MRN: [id number] Hematology and Oncology Follow Up Visit  DHRUVA ORNDOFF, MD [id number] [birth year] 81 y.o. 05/20/2015 1:14 PM   Principle Diagnosis: Encounter Diagnoses  Name Primary?  . Prostate cancer   . 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 He was diagnosed with IgG kappa monoclonal gammopathy of undetermined significance initially diagnosed in July 2000 and localized prostate cancer treated with radiofrequency ablation also diagnosed in 2000.  Main active problem relates to his atrial fibrillation. Initial onset in 2011. He has now had 5 cardioversion procedures most recent on 03/12/2015. He is currently back in normal sinus rhythm as of most recent cardiogram done 04/16/2015.  He has had 2 separate falls over the last year. The first occurred in July 2015 when he fell in his backyard while gardening. He sustained multiple lacerations of his face. Bilateral LeFort I nasal and maxillary sinus fractures. Fracture of the bones of his left hand, Left chest wall contusion. Left fifth finger laceration. He ultimately required corrective surgery on his facial bones by Dr. Constance Holster and open reduction internal fixation of the left hand fracture by Dr. Amedeo Plenty both in August 2015. He fell again at home in February 2016 and fractured his right clavicle. He has been under the care of Dr. Noemi Chapel orthopedic surgery.  Interim History:  Overall he is doing well since last visit with me and had an interim cardioversion in June as outlined above. He reports no chest symptoms at the present time and specifically denies any palpitations. Beta blockers were stopped at time of recent cardiology follow-up visit. He is currently on amiodarone 200 mg daily. He continues on Pradaxa anticoagulation.  He was concerned with increasing unsteady gait and progressive problems with fine motor coordination  such as playing the piano. 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 I have scheduled him for a neurologic evaluation.    Medications: reviewed  Allergies:  Allergies  Allergen Reactions  . Chlorhexidine Rash  . Guaifenesin & Derivatives Itching  . Keflex [Cephalexin] Diarrhea  . Oxycodone Itching and Rash  . Oxycontin [Oxycodone Hcl] Itching and Rash  . Penicillins Other (See Comments)    Caused fungal reaction    Review of Systems: See HPI Remaining ROS negative:   Physical Exam: Blood pressure 173/73, pulse 55, temperature 98.3 F (36.8 C), temperature source Oral, resp. rate 20, height 5\' 6"  (1.676 m), weight 195 lb 6.4 oz (88.633 kg), SpO2 100 %. Wt Readings from Last 3 Encounters:  05/20/15 195 lb 6.4 oz (88.633 kg)  04/16/15 193 lb 9.6 oz (87.816 kg)  03/21/15 197 lb 9.6 oz (89.631 kg)     General appearance: well nourished caucasian man HENNT: Pharynx no erythema, exudate, mass, or ulcer. No thyromegaly or thyroid nodules Lymph nodes: No cervical, supraclavicular, chronic, 1-2 centimeter, left axillary node unchanged  Breasts:  Lungs: Clear to auscultation, resonant to percussion throughout Heart: Regular rhythm, 3/6 aortic murmur, 1-2/6 mitral regurgitant murmur no gallop, no rub, no click, no edema Abdomen: Soft, nontender, normal bowel sounds, no mass, no organomegaly Extremities: No edema, no calf tenderness Musculoskeletal: no joint deformities GU:  Vascular: Carotid pulses 2+, no bruits,  Neurologic: Alert, oriented, PERRLA, optic discs sharp and vessels normal, no hemorrhage or exudate, cranial nerves grossly normal, motor strength 5 over 5, reflexes 1+ symmetric, upper body coordination normal, gait  hesitant but overall normal, Skin: No rash or ecchymosis  Lab Results: CBC W/Diff    Component Value Date/Time   WBC 7.2 05/14/2015 0955   WBC 7.4 01/14/2014 0804   RBC 3.97* 05/14/2015 0955   RBC 3.66* 01/14/2014 0804    HGB 13.0 05/14/2015 0955   HGB 12.1* 01/14/2014 0804   HCT 39.7 05/14/2015 0955   HCT 36.5* 01/14/2014 0804   PLT 275 05/14/2015 0955   PLT 199 01/14/2014 0804   MCV 100.0 05/14/2015 0955   MCV 99.8* 01/14/2014 0804   MCH 32.7 05/14/2015 0955   MCH 33.2 01/14/2014 0804   MCHC 32.7 05/14/2015 0955   MCHC 33.3 01/14/2014 0804   RDW 15.1 05/14/2015 0955   RDW 13.7 01/14/2014 0804   LYMPHSABS 2.7 05/14/2015 0955   LYMPHSABS 2.6 01/14/2014 0804   MONOABS 0.8 05/14/2015 0955   MONOABS 0.7 01/14/2014 0804   EOSABS 0.1 05/14/2015 0955   EOSABS 0.3 01/14/2014 0804   BASOSABS 0.0 05/14/2015 0955   BASOSABS 0.0 01/14/2014 0804     Chemistry      Component Value Date/Time   NA 139 05/14/2015 0955   NA 139 01/14/2014 0804   K 4.5 05/14/2015 0955   K 4.8 01/14/2014 0804   CL 104 05/14/2015 0955   CL 107 03/19/2013 0949   CO2 25 05/14/2015 0955   CO2 25 01/14/2014 0804   BUN 17 05/14/2015 0955   BUN 16.7 01/14/2014 0804   CREATININE 1.53* 05/14/2015 0955   CREATININE 1.65* 03/10/2015 1503   CREATININE 1.7* 01/14/2014 0804   CREATININE 1.59* 09/27/2013 0848      Component Value Date/Time   CALCIUM 9.6 05/14/2015 0955   CALCIUM 9.4 01/14/2014 0804   ALKPHOS 48 05/14/2015 0955   ALKPHOS 45 01/14/2014 0804   AST 19 05/14/2015 0955   AST 21 01/14/2014 0804   ALT 14 05/14/2015 0955   ALT 20 01/14/2014 0804   BILITOT 0.7 05/14/2015 0955   BILITOT 0.71 01/14/2014 0804    IgG: 2450 mg percent done 05/14/2015. Stable compare with prior values Kappa/lambda light chain ratio 5.69 slightly increased compare with March 7 value but total kappa free light chains decreased compare with the March values. PSA: 3.6 compare with 2.4 in March 2016 and 3.0 in September 2015, 2.5 09/19/2013, 2.15 Mar 2013   Radiological Studies: No results found.  Impression:  #1. IgG kappa monoclonal gammopathy of undetermined significance. Initial diagnosis July 2000.  Improved hemoglobin, total IgG within  range of previous values but trend for increase in the serum kappa free light chains. No immediate concern and I will keep him on a 6 month interval laboratory follow-up.  #2. Localized prostate cancer treated with radiofrequency ablation at time of diagnosis in February of 2000. Stable, but elevated, PSA with minor fluctuations. Current value higher than previous. He continues annual follow-up with Dr. Gaynelle Arabian. I will continue to monitor PSA on an every six-month basis.  #3. History of 2 isolated episodes of self-limited gross hematuria  This may be a result of anticoagulation with Pradaxa. No recent recurrences.  #4. Single vessel coronary artery disease.   #5. Aortic systolic murmur with mild aortic stenosis and regurgitation on echocardiogram.   #6. Atrial fibrillation  Now status post cardioversion 5. Back in sinus rhythm for the moment. He continues on full dose anticoagulation with Pradaxa 75 mg twice daily , aspirin 81 mg daily, amiodarone 200 mg mg  Daily.  Lopressor 25 mg twice a day was stopped at  time of recent June 1 cardiology visit in view of bradycardia.  #7. GERD   #8. Hyperlipidemia   #9. Status post excision multiple basal cell carcinomas   #10. Degenerative arthritis of the spine.   #11. Remote history of a transient ischemic attack.   #12. Orthopedic problems with right shoulder for arthroscopy and status post rotator cuff repair  01/23/2014.  #13. bilateral traumatic LeFort I fractures of the maxillary bones and fracture of the left wrist metacarpal bone requiring surgery at both sites. 7/15  #14. Nondisplaced fracture right clavicle February 2016.  #15. Unsteady gait and progression of familial tremor: For complete neurologic evaluation next month.   CC: Patient Care Team: Annia Belt, MD as PCP - General (Oncology) Lennon Alstrom, MD (Neurology) Ronald Lobo, MD (Gastroenterology) Carolan Clines, MD (Urology) Carlena Bjornstad, MD as  Consulting Physician (Cardiology)   Annia Belt, MD 7/5/20161:14 PM

## 2015-05-20 NOTE — Patient Instructions (Signed)
Lab 11/11/15   MD visit 1-2 weeks after lab

## 2015-06-25 ENCOUNTER — Encounter: Payer: Self-pay | Admitting: Neurology

## 2015-06-25 ENCOUNTER — Ambulatory Visit (INDEPENDENT_AMBULATORY_CARE_PROVIDER_SITE_OTHER): Payer: Medicare PPO | Admitting: Neurology

## 2015-06-25 VITALS — BP 148/70 | HR 57 | Ht 68.0 in | Wt 193.6 lb

## 2015-06-25 DIAGNOSIS — G20 Parkinson's disease: Secondary | ICD-10-CM | POA: Diagnosis not present

## 2015-06-25 DIAGNOSIS — I480 Paroxysmal atrial fibrillation: Secondary | ICD-10-CM

## 2015-06-25 DIAGNOSIS — Z7901 Long term (current) use of anticoagulants: Secondary | ICD-10-CM | POA: Diagnosis not present

## 2015-06-25 DIAGNOSIS — G250 Essential tremor: Secondary | ICD-10-CM

## 2015-06-25 DIAGNOSIS — G20A1 Parkinson's disease without dyskinesia, without mention of fluctuations: Secondary | ICD-10-CM

## 2015-06-25 DIAGNOSIS — D472 Monoclonal gammopathy: Secondary | ICD-10-CM | POA: Diagnosis not present

## 2015-06-25 MED ORDER — PRIMIDONE 50 MG PO TABS: 12.5000 mg | ORAL_TABLET | Freq: Every day | ORAL | Status: DC

## 2015-06-25 NOTE — Patient Instructions (Addendum)
-   put on primidone for essential tremor. Low dose from 12.5mg  at night daily for a week and then 25mg  at night daily. Can gradually tritrating up if no effect. - may consider PT/OT for gait and balance if further progression - may consider refer to Dr. Rexene Alberts if further progression - avoid mechanical fall, use cane or walker if needed - continue pradaxa for stroke prevention - follow up with cardiology and hematology - follow up in 2 months.

## 2015-06-26 DIAGNOSIS — D472 Monoclonal gammopathy: Secondary | ICD-10-CM | POA: Insufficient documentation

## 2015-06-26 NOTE — Progress Notes (Addendum)
NEUROLOGY CLINIC NEW PATIENT NOTE  NAME: [name] Scull, [name] [name] DOB: 1925/09/16 REFERRING PHYSICIAN: Annia Belt, [name] [name]  I saw [name] Scull, [name] [name] as a new consult in the neurovascular clinic today regarding tremor and falls Chief Complaint  Patient presents with  . Follow-up    internal referral  .  HPI: [name] Scull, [name] [name] is a 81 y.o. male with PMH of afib on pradaxa, MGUS in 2000, prostate cancer s/p radiofrequency ablation in 2000, and ET who presents as a new consult for tremor and falls.   He has had tremor in both hands for about 20-30 years, R hand > left hand, slowly getting worse over time, has more and more writing difficulty and fine motor difficulty, not able to write well anymore, but still able to play piano. Alcohol seems no effect on tremor, and he has stroke FH with ET which happened to his father, aunt and cousins. He has used metoprolol for afib treatment, however, no effects on ET and he is not able to tolerate metoprolol due to weakness. He has not had any specific treatment for ET in the past but now would like to have some medication to help this.  He also had 4 major falls for the last 3 years, one was he slipped in the rain and injured his right knee, one was tripped over in the kitchen floor, one was tripped over in 12/2014 causing right clavicle fracture. The worst one was 05/2014, when he fell in his backyard while carrying heavy load. He sustained multiple lacerations of his face. Bilateral LeFort I nasal and maxillary sinus fractures. Fracture of the bones of his left hand, Left chest wall contusion. Left fifth finger laceration. He lost a lot of blood and Hb down from 12 to 8. He ultimately required corrective surgery on his facial bones by Dr. Constance Holster and open reduction internal fixation of the left hand fracture by Dr. Amedeo Plenty both in August 2015. He also complains that for the last year, his gait became unsteady with propensity to fall, has to hold onto  thing especially up/down stairs. He also complains of short memory difficulty.  He was diagnosed with IgG kappa monoclonal gammopathy of undetermined significance initially in July 2000 and localized prostate cancer treated with radiofrequency ablation also diagnosed in 2000, currently stable, no specific treatment needed. He has atrial fibrillation with initial onset in 2011. He has now had 5 cardioversion procedures most recent on 03/12/2015. He is currently back in normal sinus rhythm and on pradaxa. He was not able to tolerate beta blockers including metoprolol which caused him to be very weak. Currently, he is off metoprolol and was put on amiodarone.  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 Follows with Dr. Beryle Beams (PCP and hematology) and Dr. Ron Parker (cardiology). Pt stated that he has chronic constipation but no acting out of dreams.  He denies smoking, or illicit drugs but social drinking.    Past Medical History  Diagnosis Date  . Coronary artery disease     minimal, cath 2005 / catheterization August, 2011, 30% in 3 vessels, normal LV function  . Hyperlipidemia   . Atrial fibrillation 2011, 2015    Multaq Started September, 2011, dose adjusted October, 2011, 200 mg a.m., 400 mg p.o.  . Monoclonal gammopathy     granfortuna  . TIA (transient ischemic attack)     Dr. Erling Cruz,, question in  the past. when off ASA for 10 days  . GERD (gastroesophageal reflux disease)     Esophageal dilatation in 1992, some esophageal spasm  . S/P transurethral resection of prostate 2004    2004  . MR (mitral regurgitation) 2009    mild, Echo, July, 2011  . Cervical spine disease   . Ejection fraction     EF 60%, echo, 2009  /  TEE normal August, 2011 /   EF 60%, echo, July, 2011  . Drug therapy     Pradaxa Started July, 2011  . Aortic stenosis     Mild, echo, July, 2011  . Aortic insufficiency     Mild, echo, July, 2011  .  Carotid artery disease     Doppler, 2008 no significant abnormality  . Diarrhea     Chronic  . Incomplete right bundle branch block     August, 2012  . Prostate cancer 11/22/2011  . Painless hematuria 03/27/2012    Occurred x 2 5/12 & 5/13 on Pradaxa 75 mg BID  . Bradycardia     Sinus bradycardia while on 25 Lopressor twice a day October, 2013  . Memory change     June, 2014  . Leg fatigue     June, 2014  . Anemia   . Renal insufficiency     Prior creatinine 1.2 /  September, 2011.. 1.7  /  October, 2011.. 1.8  . Chronic anticoagulation 01/27/2015    pradaxa 75 mg BID for A fib   Past Surgical History  Procedure Laterality Date  . History of turp    . Hernia repair    . Cataract extraction    . Fibular fracture repair    . Cardioversion  08/01/2012    Procedure: CARDIOVERSION;  Surgeon: Carlena Bjornstad, [name] [name];  Location: [facility] ENDOSCOPY;  Service: Cardiovascular;  Laterality: N/A;  . Cardioversion N/A 11/29/2013    Procedure: CARDIOVERSION;  Surgeon: Carlena Bjornstad, [name] [name];  Location: [facility] ENDOSCOPY;  Service: Cardiovascular;  Laterality: N/A;  . Colonoscopy    . Cardiac catheterization    . Eye surgery Bilateral     cataract extraction  . Shoulder arthroscopy with rotator cuff repair Right 01/23/2014    DR Noemi Chapel   . Shoulder arthroscopy with rotator cuff repair Right 01/23/2014    Procedure: RIGHT SHOULDER ARTHROSCOPY WITH DEBRIDEMENT AND ROTATOR CUFF REPAIR;  Surgeon: Lorn Junes, [name] [name];  Location: [place];  Service: Orthopedics;  Laterality: Right;  . Orif orbital fracture Bilateral 06/22/2014    Procedure: OPEN REDUCTION INTERNAL FIXATION (ORIF) BILATERAL LEFORTE1 FRACTURE OF MAXILLA, HYBRID ARCH BARS ;  Surgeon: Izora Gala, [name] [name];  Location: [place];  Service: ENT;  Laterality: Bilateral;  . Open reduction internal fixation (orif) metacarpal Left 06/22/2014    Procedure: OPEN REDUCTION INTERNAL FIXATION (ORIF) LEFT HAND METACARPAL;  Surgeon: Roseanne Kaufman, [name] [name];  Location: [place];  Service:  Orthopedics;  Laterality: Left;  . Cardioversion N/A 11/27/2014    Procedure: CARDIOVERSION;  Surgeon: Carlena Bjornstad, [name] [name];  Location: [facility] ENDOSCOPY;  Service: Cardiovascular;  Laterality: N/A;  . Cardioversion N/A 03/12/2015    Procedure: CARDIOVERSION;  Surgeon: Carlena Bjornstad, [name] [name];  Location: [facility] ENDOSCOPY;  Service: Cardiovascular;  Laterality: N/A;   Family History  Problem Relation Age of Onset  . Stroke Mother   . Kidney failure Father    Current Outpatient Prescriptions  Medication Sig Dispense Refill  . amiodarone (PACERONE) 200 MG tablet Take 1 tablet (200 mg total) by mouth daily. [place]  tablet 3  . atorvastatin (LIPITOR) 10 MG tablet Take 1 tablet (10 mg total) by mouth daily. 30 tablet 11  . dabigatran (PRADAXA) 75 MG CAPS capsule Take 1 capsule (75 mg total) by mouth every 12 (twelve) hours. 180 capsule 3  . famotidine (PEPCID) 10 MG tablet Take 10 mg by mouth daily as needed (acid reflux).    . naproxen sodium (ANAPROX) 220 MG tablet Take 220 mg by mouth daily as needed (pain). Aleve    . Phenylephrine HCl (NEO-SYNEPHRINE NA) Place 1 spray into both nostrils daily as needed (seasonal allergies).    Marland Kitchen aspirin EC 81 MG tablet Take 81 mg by mouth every Monday, Wednesday, and Friday.    . primidone (MYSOLINE) 50 MG tablet Take 0.25 tablets (12.5 mg total) by mouth at bedtime. For a week and then 25mg  Qhs after. 30 tablet 3   No current facility-administered medications for this visit.   Allergies  Allergen Reactions  . Chlorhexidine Rash  . Guaifenesin & Derivatives Itching  . Keflex [Cephalexin] Diarrhea  . Oxycodone Itching and Rash  . Oxycontin [Oxycodone Hcl] Itching and Rash  . Penicillins Other (See Comments)    Caused fungal reaction   Social History   Social History  . Marital Status: Widowed    Spouse Name: N/A  . Number of Children: N/A  . Years of Education: N/A   Occupational History  . Not on file.   Social History Main Topics  . Smoking status: Former  Research scientist (life sciences)  . Smokeless tobacco: Never Used  . Alcohol Use: 1.2 oz/week    1 Glasses of wine, 1 Shots of liquor per week     Comment: Daily.  . Drug Use: No  . Sexual Activity: Not on file   Other Topics Concern  . Not on file   Social History Narrative    Review of Systems Full 14 system review of systems performed and notable only for those listed, all others are neg:  Constitutional:   Cardiovascular: Murmur Ear/Nose/Throat:  Hearing loss, ringing in ears Skin:  Eyes:   Respiratory:   Gastroitestinal:   Genitourinary:  Hematology/Lymphatic:  Easy bruising Endocrine:  Musculoskeletal:   Allergy/Immunology:   Neurological:   Psychiatric:  Sleep:   Physical Exam  Filed Vitals:   06/25/15 1401  BP: 148/70  Pulse: 66    General - Well nourished, well developed, in no apparent distress.  Ophthalmologic - Sharp disc margins OU.  Cardiovascular - Regular rate and rhythm. Carotid pulses were 2+ without bruits .   Neck - supple, no nuchal rigidity.  Mental Status -  Level of arousal and orientation to time, place, and person were intact. Language including expression, naming, repetition, comprehension, reading, and writing was assessed and found intact. Attention span and concentration were normal. Recent and remote memory were 3/3 registration and 2/3 delayed recall. Fund of Knowledge was assessed and was intact.  Cranial Nerves II - XII - II - Visual field intact OU. III, IV, VI - Extraocular movements intact. V - Facial sensation intact bilaterally. VII - Facial movement intact bilaterally, but mild masked face. VIII - Hard of hearing & vestibular intact bilaterally. X - Palate elevates symmetrically. XI - Chin turning & shoulder shrug intact bilaterally. XII - Tongue protrusion intact.  Motor Strength - The patient's strength was normal in all extremities and pronator drift was absent.  Bulk was normal and fasciculations were absent.   Motor Tone - Muscle  tone was assessed at the neck  and appendages and was normal.  Reflexes - The patient's reflexes were normal in all extremities and he had no pathological reflexes.  Sensory - Light touch, temperature/pinprick were assessed and were normal.    Coordination - The patient had normal movements in the hands and feet with no ataxia or dysmetria.  intention tremor and mild resting tremor were seen at right hand.  Gait and Station - mild stooped posturing, no difficulty with initiation gait, but has some difficulty get out of chair without help from armrest, mild en bloc turns, pull test positive, not able to catch up himself.    Imaging  I have personally reviewed the radiological images below and agree with the radiology interpretations. none  Lab Review none    Assessment:   In summary, [name] Scull, [name] [name] is a 81 y.o. male with PMH of Afib on pradaxa s/p cardioversion x 5, MGUS, prostate cancer s/p radiofrequency ablation, ET with family hx was referred here for tremor and falls. He has hx of ET and has had ET for 20-30 years, but gradually worsening. Not willing to try beta blocker as it makes him feeling generalized weakness. After d/c beta blocker, he feels good. Will start low dose primidone for trial.   In addition, he has several mechanical falls causing fractures, mildly masked face, mild resting tremor on the right hand, chronic constipation, mild stooped posturing, difficulty get out of chair and car, mild en bloc turns, and positive pull test, suggesting that he may have early signs of parkinson's disease. We agree to continue monitoring symptoms, but will use cane or walker if needed to avoid falls.  Plan: - put on primidone for essential tremor. Low dose from 12.5mg  at night daily for a week and then 25mg  at night daily. Can gradually tritrating up if no effect. - may consider PT/OT for gait and balance if further PD progression - may consider refer to Dr. Rexene Alberts if further PD  progression - avoid mechanical fall, use cane or walker if needed - continue pradaxa and lipitor for stroke prevention - follow up with cardiology Dr Ron Parker and PCP Dr. Beryle Beams - RTC in 2 months.  Thank you very much for the opportunity to participate in the care of this patient. Please do not hesitate to call if any questions or concerns arise. I spent more than 40 minutes of face to face time with the patient. Greater than 50% of time was spent in counseling and coordination of care.    No orders of the defined types were placed in this encounter.    Meds ordered this encounter  Medications  . primidone (MYSOLINE) 50 MG tablet    Sig: Take 0.25 tablets (12.5 mg total) by mouth at bedtime. For a week and then 25mg  Qhs after.    Dispense:  30 tablet    Refill:  3    Patient Instructions  - put on primidone for essential tremor. Low dose from 12.5mg  at night daily for a week and then 25mg  at night daily. Can gradually tritrating up if no effect. - may consider PT/OT for gait and balance if further progression - may consider refer to Dr. Rexene Alberts if further progression - avoid mechanical fall, use cane or walker if needed - continue pradaxa for stroke prevention - follow up with cardiology and hematology - follow up in 2 months.   Rosalin Hawking, MD PhD [facility] [address], [place], [place] [address] [phone]

## 2015-07-15 ENCOUNTER — Other Ambulatory Visit: Payer: Self-pay | Admitting: Dermatology

## 2015-07-28 ENCOUNTER — Ambulatory Visit (INDEPENDENT_AMBULATORY_CARE_PROVIDER_SITE_OTHER): Payer: Medicare PPO | Admitting: Cardiology

## 2015-07-28 ENCOUNTER — Encounter: Payer: Self-pay | Admitting: Cardiology

## 2015-07-28 VITALS — BP 162/86 | HR 57 | Ht 68.0 in | Wt 190.2 lb

## 2015-07-28 DIAGNOSIS — Z79899 Other long term (current) drug therapy: Secondary | ICD-10-CM | POA: Diagnosis not present

## 2015-07-28 DIAGNOSIS — I350 Nonrheumatic aortic (valve) stenosis: Secondary | ICD-10-CM | POA: Diagnosis not present

## 2015-07-28 DIAGNOSIS — I480 Paroxysmal atrial fibrillation: Secondary | ICD-10-CM

## 2015-07-28 DIAGNOSIS — G250 Essential tremor: Secondary | ICD-10-CM

## 2015-07-28 LAB — TSH: TSH: 1.71 u[IU]/mL (ref 0.35–4.50)

## 2015-07-28 MED ORDER — AMIODARONE HCL 200 MG PO TABS
ORAL_TABLET | ORAL | Status: DC
Start: 2015-07-28 — End: 2015-12-09

## 2015-07-28 NOTE — Assessment & Plan Note (Signed)
The doses being reduced today to an effective dose of 150 mg daily. His hematology and chemistry labs are followed very carefully by Dr. Beryle Beams.  It is now time to check a TSH.

## 2015-07-28 NOTE — Assessment & Plan Note (Signed)
Patient has a tremor. There is question that he has some findings for parkinsonism. This is mentioned in the neurology note. The patient feels that he does not have Parkinson's.

## 2015-07-28 NOTE — Assessment & Plan Note (Signed)
Over time the patient's aortic stenosis has been mild. He does have significant leaflet calcification. His last echo was October, 2013. I explained to him that I thought it would be helpful to consider an echo in the near future to reassess his valve and LV function. He preferred to wait at this point.

## 2015-07-28 NOTE — Assessment & Plan Note (Addendum)
The history of his atrial fibrillation is outlined extensively under the problem lists. For a long time we did not use amiodarone because he was concerned about the drug. Eventually amiodarone was started and he has done well with it. In May, 2016 he received 400 mg daily for 2 weeks. Since that time he has been on 200 mg daily until today. He is anticoagulated with low dose Pradaxa. I have encouraged him to stop his aspirin but he wants to continue a small dose because of his history of TIA in the past. Amiodarone dose will be decreased to 200 mg daily alternating with 100 mg daily. Therefore he will have an effective dose of 150 mg daily. He will remain on this dose until he sees Dr. Stanford Breed in the office in several months.

## 2015-07-28 NOTE — Progress Notes (Signed)
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 After various approaches to his atrial fibrillation he was started on amiodarone. Up to this point I've kept him on 200 mg daily. His labs have been followed but he needs a follow-up TSH at this time. He continues to play golf but is very much effected by the heat. He is not having any significant chest pain or shortness of breath. He is anticoagulated with Pradaxa. I have tried to stop his aspirin. History he has insisted on remaining on aspirin because of a possible TIA in the past. He takes a small dose 3 days per week.  Patient is aware that I'm retiring on August 15, 2015. He has requested follow-up with Dr. Kirk Ruths who he knows from the past. This is being arranged.    Past Medical History  Diagnosis Date  . Coronary artery disease     minimal, cath 2005 / catheterization August, 2011, 30% in 3 vessels, normal LV function  . Hyperlipidemia   . Atrial fibrillation 2011, 2015    Multaq Started September, 2011, dose adjusted October, 2011, 200 mg a.m., 400 mg p.o.  . Monoclonal gammopathy     granfortuna  . TIA (transient ischemic attack)     Dr. Erling Cruz,, question in the past. when off ASA for 10 days  . GERD (gastroesophageal reflux disease)     Esophageal dilatation in 1992, some esophageal spasm  . S/P transurethral resection of prostate 2004    2004  . MR (mitral regurgitation) 2009    mild, Echo, July, 2011  . Cervical spine disease   . Ejection fraction     EF 60%, echo, 2009  /  TEE  normal August, 2011 /   EF 60%, echo, July, 2011  . Drug therapy     Pradaxa Started July, 2011  . Aortic stenosis     Mild, echo, July, 2011  . Aortic insufficiency     Mild, echo, July, 2011  . Carotid artery disease     Doppler, 2008 no significant abnormality  . Diarrhea     Chronic  . Incomplete right bundle branch block     August, 2012  . Prostate cancer 11/22/2011  . Painless hematuria 03/27/2012    Occurred x 2 5/12 & 5/13 on Pradaxa 75 mg BID  . Bradycardia     Sinus bradycardia while on 25 Lopressor twice a day October, 2013  . Memory change     June, 2014  . Leg fatigue     June, 2014  . Anemia   . Renal insufficiency     Prior creatinine 1.2 /  September, 2011.. 1.7  /  October, 2011.. 1.8  . Chronic anticoagulation 01/27/2015    pradaxa 75 mg BID for A fib    Past Surgical History  Procedure Laterality Date  . History of turp    . Hernia repair    . Cataract extraction    . Fibular fracture repair    . Cardioversion  08/01/2012  Procedure: CARDIOVERSION;  Surgeon: Carlena Bjornstad, MD;  Location: [facility] ENDOSCOPY;  Service: Cardiovascular;  Laterality: N/A;  . Cardioversion N/A 11/29/2013    Procedure: CARDIOVERSION;  Surgeon: Carlena Bjornstad, MD;  Location: [place];  Service: Cardiovascular;  Laterality: N/A;  . Colonoscopy    . Cardiac catheterization    . Eye surgery Bilateral     cataract extraction  . Shoulder arthroscopy with rotator cuff repair Right 01/23/2014    DR Noemi Chapel   . Shoulder arthroscopy with rotator cuff repair Right 01/23/2014    Procedure: RIGHT SHOULDER ARTHROSCOPY WITH DEBRIDEMENT AND ROTATOR CUFF REPAIR;  Surgeon: Lorn Junes, MD;  Location: [place];  Service: Orthopedics;  Laterality: Right;  . Orif orbital fracture Bilateral 06/22/2014    Procedure: OPEN REDUCTION INTERNAL FIXATION (ORIF) BILATERAL LEFORTE1 FRACTURE OF MAXILLA, HYBRID ARCH BARS ;  Surgeon: Izora Gala, MD;  Location: [place];  Service: ENT;  Laterality: Bilateral;  . Open  reduction internal fixation (orif) metacarpal Left 06/22/2014    Procedure: OPEN REDUCTION INTERNAL FIXATION (ORIF) LEFT HAND METACARPAL;  Surgeon: Roseanne Kaufman, MD;  Location: [place];  Service: Orthopedics;  Laterality: Left;  . Cardioversion N/A 11/27/2014    Procedure: CARDIOVERSION;  Surgeon: Carlena Bjornstad, MD;  Location: [facility] ENDOSCOPY;  Service: Cardiovascular;  Laterality: N/A;  . Cardioversion N/A 03/12/2015    Procedure: CARDIOVERSION;  Surgeon: Carlena Bjornstad, MD;  Location: [facility] ENDOSCOPY;  Service: Cardiovascular;  Laterality: N/A;    Patient Active Problem List   Diagnosis Date Noted  . Atrial fibrillation     Priority: High  . Coronary artery disease     Priority: High  . MR (mitral regurgitation)     Priority: High  . Drug therapy     Priority: High  . Aortic stenosis     Priority: High  . Aortic insufficiency     Priority: High  . MGUS (monoclonal gammopathy of unknown significance) 06/26/2015  . PD (Parkinson's disease) 06/26/2015  . Essential tremor 06/25/2015  . Right bundle branch block 04/16/2015  . On amiodarone therapy 04/16/2015  . Edema leg 03/21/2015  . Chronic anticoagulation 01/27/2015  . Clavicle fracture 12/16/2014  . Le[place] I fracture of maxilla 06/22/2014  . Preop cardiovascular exam 06/19/2014  . Facial fracture due to fall 06/07/2014  . Rotator cuff tear, right 01/23/2014  . GERD (gastroesophageal reflux disease)   . Skin lesion of back 12/24/2013  . Memory change   . Leg fatigue   . Bradycardia   . Carotid artery disease   . Painless hematuria 03/27/2012  . Prostate cancer 11/22/2011  . Hyperlipidemia   . Monoclonal gammopathy   . TIA (transient ischemic attack)   . S/P transurethral resection of prostate   . Cervical spine disease   . Renal insufficiency   . Ejection fraction       Current Outpatient Prescriptions  Medication Sig Dispense Refill  . aspirin EC 81 MG tablet Take 81 mg by mouth every Monday, Wednesday, and Friday.    Marland Kitchen  atorvastatin (LIPITOR) 10 MG tablet Take 1 tablet (10 mg total) by mouth daily. 30 tablet 11  . dabigatran (PRADAXA) 75 MG CAPS capsule Take 1 capsule (75 mg total) by mouth every 12 (twelve) hours. 180 capsule 3  . famotidine (PEPCID) 10 MG tablet Take 10 mg by mouth daily as needed (acid reflux).    . naproxen sodium (ANAPROX) 220 MG tablet Take 220 mg by mouth daily as needed (pain). Aleve    .  Phenylephrine HCl (NEO-SYNEPHRINE NA) Place 1 spray into both nostrils daily as needed (seasonal allergies).    . primidone (MYSOLINE) 50 MG tablet Take 25 mg by mouth at bedtime.  0  . amiodarone (PACERONE) 200 MG tablet Alternate 1 tablet (200mg ) with 1/2 tablet (100mg ) by mouth daily 75 tablet 1   No current facility-administered medications for this visit.    Allergies:   Chlorhexidine; Guaifenesin & derivatives; Keflex; Oxycodone; Oxycontin; and Penicillins    Social History:  The patient  reports that he has quit smoking. He has never used smokeless tobacco. He reports that he drinks about 1.2 oz of alcohol per week. He reports that he does not use illicit drugs.   Family History:  The patient's family history includes Kidney failure in his father; Stroke in his mother.    ROS:  Please see the history of present illness.     Patient denies fever, chills, headache, sweats, rash, change in vision, change in hearing, chest pain, cough, nausea or vomiting, urinary symptoms. All other systems are reviewed and are negative.   PHYSICAL EXAM: VS:  BP 162/86 mmHg  Pulse 57  Ht 5\' 8"  (1.727 m)  Wt 190 lb 3.2 oz (86.274 kg)  BMI 28.93 kg/m2  SpO2 99% ,  Patient looks quite good. He has Band-Aids in place after skin cancer removal. He is oriented to person time and place. Affect is normal. Head is atraumatic. Sclera conjunctiva are normal. There is no jugulovenous distention. Lungs are clear. Respiratory effort is not labored. He has a 3/6 crescendo decrescendo systolic murmur consistent with his  aortic stenosis. This has been mild in the past. Abdomen is soft. There is no peripheral edema. There are no musculoskeletal deformities.  EKG:   EKG is not done today.   Recent Labs: 05/14/2015: ALT 14; BUN 17; Creat 1.53*; Hemoglobin 13.0; Platelets 275; Potassium 4.5; Sodium 139    Lipid Panel    Component Value Date/Time   CHOL  06/14/2010 0640    140        ATP III CLASSIFICATION:  <200     mg/dL   Desirable  200-239  mg/dL   Borderline High  >=240    mg/dL   High          TRIG 34 06/14/2010 0640   HDL 57 06/14/2010 0640   CHOLHDL 2.5 06/14/2010 0640   VLDL 7 06/14/2010 0640   LDLCALC  06/14/2010 0640    76        Total Cholesterol/HDL:CHD Risk Coronary Heart Disease Risk Table                     Men   Women  1/2 Average Risk   3.4   3.3  Average Risk       5.0   4.4  2 X Average Risk   9.6   7.1  3 X Average Risk  23.4   11.0        Use the calculated Patient Ratio above and the CHD Risk Table to determine the patient's CHD Risk.        ATP III CLASSIFICATION (LDL):  <100     mg/dL   Optimal  100-129  mg/dL   Near or Above                    Optimal  130-159  mg/dL   Borderline  160-189  mg/dL   High  >190  mg/dL   Very High      Wt Readings from Last 3 Encounters:  07/28/15 190 lb 3.2 oz (86.274 kg)  06/25/15 193 lb 9.6 oz (87.816 kg)  05/20/15 195 lb 6.4 oz (88.633 kg)      Current medicines are reviewed  The patient understands his medications.   ASSESSMENT AND PLAN:

## 2015-07-28 NOTE — Patient Instructions (Signed)
Medication Instructions:  Decrease amiodarone to alternate 200mg  with 100mg  daily. This will be one tablet (200mg ) alternating with one-half tablet (100mg ) daily  Labwork: TSH today  Testing/Procedures: None today  Follow-Up: Your physician recommends that you schedule a follow-up appointment in: December with Dr Stanford Breed.

## 2015-08-27 ENCOUNTER — Other Ambulatory Visit: Payer: Self-pay | Admitting: Dermatology

## 2015-09-01 ENCOUNTER — Ambulatory Visit: Payer: Medicare PPO | Admitting: Neurology

## 2015-10-23 NOTE — Progress Notes (Signed)
HPI: Follow-up paroxysmal atrial fibrillation. Previously followed by Dr. Ron Parker. Had cardiac catheterization in August 2011 that showed nonobstructive coronary disease. Abdominal ultrasound July 2014 show no aneurysm. Echocardiogram October 2013 showed normal LV function, mild left ventricular enlargement and mild left ventricular hypertrophy. There was mild aortic stenosis with mean gradient 16 mmHg and mild aortic insufficiency. There was biatrial enlargement and mild right ventricular enlargement. Had cardioversion April 2016 and is maintained on amiodarone. Patient denies dyspnea, chest pain, palpitations, syncope or bleeding.  Current Outpatient Prescriptions  Medication Sig Dispense Refill  . dabigatran (PRADAXA) 75 MG CAPS capsule Take 1 capsule (75 mg total) by mouth every 12 (twelve) hours. 180 capsule 3  . amiodarone (PACERONE) 200 MG tablet Alternate 1 tablet (200mg ) with 1/2 tablet (100mg ) by mouth daily 75 tablet 1  . aspirin EC 81 MG tablet Take 81 mg by mouth every Monday, Wednesday, and Friday.    . famotidine (PEPCID) 10 MG tablet Take 10 mg by mouth daily as needed (acid reflux).    . Phenylephrine HCl (NEO-SYNEPHRINE NA) Place 1 spray into both nostrils daily as needed (seasonal allergies).     No current facility-administered medications for this visit.     Past Medical History  Diagnosis Date  . Coronary artery disease     minimal, cath 2005 / catheterization August, 2011, 30% in 3 vessels, normal LV function  . Hyperlipidemia   . Atrial fibrillation ([place]) 2011, 2015    Multaq Started September, 2011, dose adjusted October, 2011, 200 mg a.m., 400 mg p.o.  . Monoclonal gammopathy     granfortuna  . TIA (transient ischemic attack)     Dr. Erling Cruz,, question in the past. when off ASA for 10 days  . GERD (gastroesophageal reflux disease)     Esophageal dilatation in 1992, some esophageal spasm  . S/P transurethral resection of prostate 2004    2004  . MR (mitral  regurgitation) 2009    mild, Echo, July, 2011  . Cervical spine disease   . Ejection fraction     EF 60%, echo, 2009  /  TEE normal August, 2011 /   EF 60%, echo, July, 2011  . Drug therapy     Pradaxa Started July, 2011  . Aortic stenosis     Mild, echo, July, 2011  . Aortic insufficiency     Mild, echo, July, 2011  . Carotid artery disease ([place])     Doppler, 2008 no significant abnormality  . Diarrhea     Chronic  . Incomplete right bundle branch block     August, 2012  . Prostate cancer ([place]) 11/22/2011  . Painless hematuria 03/27/2012    Occurred x 2 5/12 & 5/13 on Pradaxa 75 mg BID  . Bradycardia     Sinus bradycardia while on 25 Lopressor twice a day October, 2013  . Memory change     June, 2014  . Leg fatigue     June, 2014  . Anemia   . Renal insufficiency     Prior creatinine 1.2 /  September, 2011.. 1.7  /  October, 2011.. 1.8  . Chronic anticoagulation 01/27/2015    pradaxa 75 mg BID for A fib    Past Surgical History  Procedure Laterality Date  . History of turp    . Hernia repair    . Cataract extraction    . Fibular fracture repair    . Cardioversion  08/01/2012    Procedure: CARDIOVERSION;  Surgeon:  Carlena Bjornstad, MD;  Location: [facility] ENDOSCOPY;  Service: Cardiovascular;  Laterality: N/A;  . Cardioversion N/A 11/29/2013    Procedure: CARDIOVERSION;  Surgeon: Carlena Bjornstad, MD;  Location: [facility] ENDOSCOPY;  Service: Cardiovascular;  Laterality: N/A;  . Colonoscopy    . Cardiac catheterization    . Eye surgery Bilateral     cataract extraction  . Shoulder arthroscopy with rotator cuff repair Right 01/23/2014    DR Noemi Chapel   . Shoulder arthroscopy with rotator cuff repair Right 01/23/2014    Procedure: RIGHT SHOULDER ARTHROSCOPY WITH DEBRIDEMENT AND ROTATOR CUFF REPAIR;  Surgeon: Lorn Junes, MD;  Location: [place];  Service: Orthopedics;  Laterality: Right;  . Orif orbital fracture Bilateral 06/22/2014    Procedure: OPEN REDUCTION INTERNAL FIXATION (ORIF) BILATERAL  LEFORTE1 FRACTURE OF MAXILLA, HYBRID ARCH BARS ;  Surgeon: Izora Gala, MD;  Location: [place];  Service: ENT;  Laterality: Bilateral;  . Open reduction internal fixation (orif) metacarpal Left 06/22/2014    Procedure: OPEN REDUCTION INTERNAL FIXATION (ORIF) LEFT HAND METACARPAL;  Surgeon: Roseanne Kaufman, MD;  Location: [place];  Service: Orthopedics;  Laterality: Left;  . Cardioversion N/A 11/27/2014    Procedure: CARDIOVERSION;  Surgeon: Carlena Bjornstad, MD;  Location[facility] ENDOSCOPY;  Service: Cardiovascular;  Laterality: N/A;  . Cardioversion N/A 03/12/2015    Procedure: CARDIOVERSION;  Surgeon: Carlena Bjornstad, MD;  Location: [facility] ENDOSCOPY;  Service: Cardiovascular;  Laterality: N/A;    Social History   Social History  . Marital Status: Widowed    Spouse Name: N/A  . Number of Children: N/A  . Years of Education: N/A   Occupational History  . Not on file.   Social History Main Topics  . Smoking status: Former Research scientist (life sciences)  . Smokeless tobacco: Never Used  . Alcohol Use: 1.2 oz/week    1 Glasses of wine, 1 Shots of liquor per week     Comment: Daily.  . Drug Use: No  . Sexual Activity: Not on file   Other Topics Concern  . Not on file   Social History Narrative    ROS: no fevers or chills, productive cough, hemoptysis, dysphasia, odynophagia, melena, hematochezia, dysuria, hematuria, rash, seizure activity, orthopnea, PND, pedal edema, claudication. Remaining systems are negative.  Physical Exam: Well-developed well-nourished in no acute distress.  Skin is warm and dry.  HEENT is normal.  Neck is supple.  Chest is clear to auscultation with normal expansion.  Cardiovascular exam is regular rate and rhythm. 2/6 systolic murmur Abdominal exam nontender or distended. No masses palpated. Extremities show no edema. neuro grossly intact  ECG Sinus rhythm at a rate of 56. Left anterior fascicular block. Right bundle branch block.

## 2015-10-27 ENCOUNTER — Ambulatory Visit (INDEPENDENT_AMBULATORY_CARE_PROVIDER_SITE_OTHER): Payer: Medicare PPO | Admitting: Cardiology

## 2015-10-27 ENCOUNTER — Encounter: Payer: Self-pay | Admitting: Cardiology

## 2015-10-27 ENCOUNTER — Ambulatory Visit
Admission: RE | Admit: 2015-10-27 | Discharge: 2015-10-27 | Disposition: A | Discharge status: Home / Self Care | Payer: Medicare PPO | Source: Ambulatory Visit | Attending: Cardiology | Admitting: Cardiology

## 2015-10-27 VITALS — BP 150/82 | HR 56 | Ht 69.0 in | Wt 186.3 lb

## 2015-10-27 DIAGNOSIS — I480 Paroxysmal atrial fibrillation: Secondary | ICD-10-CM | POA: Diagnosis not present

## 2015-10-27 DIAGNOSIS — I350 Nonrheumatic aortic (valve) stenosis: Secondary | ICD-10-CM

## 2015-10-27 DIAGNOSIS — E785 Hyperlipidemia, unspecified: Secondary | ICD-10-CM | POA: Diagnosis not present

## 2015-10-27 DIAGNOSIS — R030 Elevated blood-pressure reading, without diagnosis of hypertension: Secondary | ICD-10-CM | POA: Diagnosis not present

## 2015-10-27 DIAGNOSIS — IMO0001 Reserved for inherently not codable concepts without codable children: Secondary | ICD-10-CM

## 2015-10-27 LAB — HEPATIC FUNCTION PANEL
ALBUMIN: 3.5 g/dL — AB (ref 3.6–5.1)
ALK PHOS: 38 U/L — AB (ref 40–115)
ALT: 17 U/L (ref 9–46)
AST: 18 U/L (ref 10–35)
Bilirubin, Direct: <0.1 mg/dL (ref <=0.2)
TOTAL PROTEIN: 7.2 g/dL (ref 6.1–8.1)
Total Bilirubin: 0.4 mg/dL (ref 0.2–1.2)

## 2015-10-27 LAB — TSH: TSH: 1.611 u[IU]/mL (ref 0.350–4.500)

## 2015-10-27 LAB — BASIC METABOLIC PANEL
BUN: 26 mg/dL — ABNORMAL HIGH (ref 7–25)
CHLORIDE: 105 mmol/L (ref 98–110)
CO2: 25 mmol/L (ref 20–31)
Calcium: 9.1 mg/dL (ref 8.6–10.3)
Creat: 1.59 mg/dL — ABNORMAL HIGH (ref 0.70–1.11)
GLUCOSE: 75 mg/dL (ref 65–99)
Potassium: 5.3 mmol/L (ref 3.5–5.3)
Sodium: 138 mmol/L (ref 135–146)

## 2015-10-27 LAB — CBC
HEMATOCRIT: 36.3 % — AB (ref 39.0–52.0)
Hemoglobin: 12 g/dL — ABNORMAL LOW (ref 13.0–17.0)
MCH: 33.3 pg (ref 26.0–34.0)
MCHC: 33.1 g/dL (ref 30.0–36.0)
MCV: 100.8 fL — AB (ref 78.0–100.0)
MPV: 9.6 fL (ref 8.6–12.4)
PLATELETS: 277 10*3/uL (ref 150–400)
RBC: 3.6 MIL/uL — AB (ref 4.22–5.81)
RDW: 14.1 % (ref 11.5–15.5)
WBC: 8 10*3/uL (ref 4.0–10.5)

## 2015-10-27 NOTE — Patient Instructions (Signed)
Medication Instructions:  Please continue your current medications  Labwork: Your physician recommends that you return for lab work TODAY.  Testing/Procedures: 1. Chest X-ray - A chest x-ray takes a picture of the organs and structures inside the chest, including the heart, lungs, and blood vessels. This test can show several things, including, whether the heart is enlarges; whether fluid is building up in the lungs; and whether pacemaker / defibrillator leads are still in place. 2. 2D Echocardiogram - Your physician has requested that you have an echocardiogram. Echocardiography is a painless test that uses sound waves to create images of your heart. It provides your doctor with information about the size and shape of your heart and how well your heart's chambers and valves are working. This procedure takes approximately one hour. There are no restrictions for this procedure.  Follow-Up: Dr Stanford Breed recommends that you schedule a follow-up appointment in 6 months. You will receive a reminder letter in the mail two months in advance. If you don't receive a letter, please call our office to schedule the follow-up appointment.  If you need a refill on your cardiac medications before your next appointment, please call your pharmacy.

## 2015-10-27 NOTE — Assessment & Plan Note (Addendum)
Patient remains in sinus rhythm. Continue amiodarone. Check chest x-ray, TSH, liver functions. Continue pradaxa. Check hemoglobin and renal function. I recommended patient discontinue aspirin given need for anticoagulation. However he is a retired Engineer, civil (consulting) and wants to continue. Patient has had some falls previously but not in the past 1-1/2 years. If this worsens we would need to consider discontinuing anticoagulation.

## 2015-10-27 NOTE — Assessment & Plan Note (Signed)
Plan repeat echocardiogram. 

## 2015-10-27 NOTE — Assessment & Plan Note (Addendum)
Patient his statin as he thought it may be contributing to tremor. We will resume later if his symptoms do not improve.

## 2015-10-27 NOTE — Assessment & Plan Note (Signed)
Patient's blood pressure is mildly elevated today. I've asked him to track this and we will add medications if needed.

## 2015-10-28 NOTE — Addendum Note (Signed)
Addended by: Diana Eves on: 10/28/2015 11:36 AM   Modules accepted: Orders

## 2015-11-12 ENCOUNTER — Other Ambulatory Visit: Payer: Self-pay

## 2015-11-12 ENCOUNTER — Ambulatory Visit (HOSPITAL_COMMUNITY): Payer: Medicare PPO | Attending: Cardiovascular Disease

## 2015-11-12 DIAGNOSIS — Z87891 Personal history of nicotine dependence: Secondary | ICD-10-CM | POA: Insufficient documentation

## 2015-11-12 DIAGNOSIS — I4510 Unspecified right bundle-branch block: Secondary | ICD-10-CM | POA: Insufficient documentation

## 2015-11-12 DIAGNOSIS — I350 Nonrheumatic aortic (valve) stenosis: Secondary | ICD-10-CM | POA: Insufficient documentation

## 2015-11-12 DIAGNOSIS — R030 Elevated blood-pressure reading, without diagnosis of hypertension: Secondary | ICD-10-CM | POA: Diagnosis not present

## 2015-11-12 DIAGNOSIS — I059 Rheumatic mitral valve disease, unspecified: Secondary | ICD-10-CM | POA: Diagnosis not present

## 2015-11-12 DIAGNOSIS — IMO0001 Reserved for inherently not codable concepts without codable children: Secondary | ICD-10-CM

## 2015-11-12 DIAGNOSIS — I517 Cardiomegaly: Secondary | ICD-10-CM | POA: Insufficient documentation

## 2015-11-12 DIAGNOSIS — G20 Parkinson's disease: Secondary | ICD-10-CM | POA: Diagnosis not present

## 2015-11-12 DIAGNOSIS — I340 Nonrheumatic mitral (valve) insufficiency: Secondary | ICD-10-CM | POA: Diagnosis not present

## 2015-11-12 DIAGNOSIS — E785 Hyperlipidemia, unspecified: Secondary | ICD-10-CM | POA: Insufficient documentation

## 2015-11-12 DIAGNOSIS — I480 Paroxysmal atrial fibrillation: Secondary | ICD-10-CM | POA: Insufficient documentation

## 2015-11-12 DIAGNOSIS — I352 Nonrheumatic aortic (valve) stenosis with insufficiency: Secondary | ICD-10-CM | POA: Diagnosis not present

## 2015-11-13 ENCOUNTER — Other Ambulatory Visit (INDEPENDENT_AMBULATORY_CARE_PROVIDER_SITE_OTHER): Payer: Medicare PPO

## 2015-11-13 DIAGNOSIS — D472 Monoclonal gammopathy: Secondary | ICD-10-CM | POA: Diagnosis not present

## 2015-11-14 LAB — CMP14 + ANION GAP
ALBUMIN: 3.6 g/dL (ref 3.5–4.7)
ALK PHOS: 46 IU/L (ref 39–117)
ALT: 20 IU/L (ref 0–44)
ANION GAP: 13 mmol/L (ref 10.0–18.0)
AST: 26 IU/L (ref 0–40)
Albumin/Globulin Ratio: 1 — ABNORMAL LOW (ref 1.1–2.5)
BUN / CREAT RATIO: 12 (ref 10–22)
BUN: 19 mg/dL (ref 8–27)
Bilirubin Total: 0.5 mg/dL (ref 0.0–1.2)
CALCIUM: 8.9 mg/dL (ref 8.6–10.2)
CO2: 24 mmol/L (ref 18–29)
CREATININE: 1.53 mg/dL — AB (ref 0.76–1.27)
Chloride: 104 mmol/L (ref 96–106)
GFR calc Af Amer: 46 mL/min/{1.73_m2} — ABNORMAL LOW (ref >59)
GFR, EST NON AFRICAN AMERICAN: 40 mL/min/{1.73_m2} — AB (ref >59)
GLOBULIN, TOTAL: 3.6 g/dL (ref 1.5–4.5)
Glucose: 100 mg/dL — ABNORMAL HIGH (ref 65–99)
Potassium: 4.8 mmol/L (ref 3.5–5.2)
SODIUM: 141 mmol/L (ref 134–144)
Total Protein: 7.2 g/dL (ref 6.0–8.5)

## 2015-11-14 LAB — CBC WITH DIFFERENTIAL/PLATELET
BASOS: 0 %
Basophils Absolute: 0 10*3/uL (ref 0.0–0.2)
EOS (ABSOLUTE): 0.2 10*3/uL (ref 0.0–0.4)
EOS: 3 %
HEMATOCRIT: 36.4 % — AB (ref 37.5–51.0)
Hemoglobin: 11.8 g/dL — ABNORMAL LOW (ref 12.6–17.7)
IMMATURE GRANULOCYTES: 0 %
Immature Grans (Abs): 0 10*3/uL (ref 0.0–0.1)
Lymphocytes Absolute: 2.1 10*3/uL (ref 0.7–3.1)
Lymphs: 31 %
MCH: 33.3 pg — ABNORMAL HIGH (ref 26.6–33.0)
MCHC: 32.4 g/dL (ref 31.5–35.7)
MCV: 103 fL — AB (ref 79–97)
MONOS ABS: 0.8 10*3/uL (ref 0.1–0.9)
Monocytes: 12 %
NEUTROS PCT: 54 %
Neutrophils Absolute: 3.7 10*3/uL (ref 1.4–7.0)
Platelets: 279 10*3/uL (ref 150–379)
RBC: 3.54 x10E6/uL — ABNORMAL LOW (ref 4.14–5.80)
RDW: 14.1 % (ref 12.3–15.4)
WBC: 6.8 10*3/uL (ref 3.4–10.8)

## 2015-11-14 LAB — IGG, IGA, IGM
IGA/IMMUNOGLOBULIN A, SERUM: 97 mg/dL (ref 61–437)
IGG (IMMUNOGLOBIN G), SERUM: 2193 mg/dL — AB (ref 700–1600)
IGM (IMMUNOGLOBULIN M), SRM: 84 mg/dL (ref 15–143)

## 2015-11-14 LAB — PSA: PROSTATE SPECIFIC AG, SERUM: 2.9 ng/mL (ref 0.0–4.0)

## 2015-11-14 LAB — KAPPA/LAMBDA LIGHT CHAINS
IG KAPPA FREE LIGHT CHAIN: 63.04 mg/L — AB (ref 3.30–19.40)
IG LAMBDA FREE LIGHT CHAIN: 12.14 mg/L (ref 5.71–26.30)
Kappa/Lambda FluidC Ratio: 5.19 — ABNORMAL HIGH (ref 0.26–1.65)

## 2015-11-19 ENCOUNTER — Telehealth: Payer: Self-pay | Admitting: *Deleted

## 2015-11-19 NOTE — Telephone Encounter (Signed)
-----   Message from Annia Belt, MD sent at 11/14/2015  2:15 PM EST ----- Call pt: lab all close to baseline.  OK to send a copy to Dr [name]

## 2015-11-19 NOTE — Telephone Encounter (Signed)
Pt called - memory fulled, unable to leave message. I will mail copy of results per Dr Beryle Beams.

## 2015-11-24 ENCOUNTER — Ambulatory Visit (INDEPENDENT_AMBULATORY_CARE_PROVIDER_SITE_OTHER): Payer: Medicare Other | Admitting: Oncology

## 2015-11-24 ENCOUNTER — Encounter: Payer: Self-pay | Admitting: Oncology

## 2015-11-24 VITALS — BP 163/59 | HR 58 | Temp 98.1°F | Wt 191.5 lb

## 2015-11-24 DIAGNOSIS — I4891 Unspecified atrial fibrillation: Secondary | ICD-10-CM | POA: Diagnosis not present

## 2015-11-24 DIAGNOSIS — Z8546 Personal history of malignant neoplasm of prostate: Secondary | ICD-10-CM | POA: Diagnosis not present

## 2015-11-24 DIAGNOSIS — D472 Monoclonal gammopathy: Secondary | ICD-10-CM

## 2015-11-24 DIAGNOSIS — I2510 Atherosclerotic heart disease of native coronary artery without angina pectoris: Secondary | ICD-10-CM | POA: Diagnosis not present

## 2015-11-24 DIAGNOSIS — G250 Essential tremor: Secondary | ICD-10-CM

## 2015-11-24 DIAGNOSIS — N289 Disorder of kidney and ureter, unspecified: Secondary | ICD-10-CM

## 2015-11-24 DIAGNOSIS — C61 Malignant neoplasm of prostate: Secondary | ICD-10-CM

## 2015-11-24 DIAGNOSIS — R413 Other amnesia: Secondary | ICD-10-CM

## 2015-11-24 DIAGNOSIS — Z7901 Long term (current) use of anticoagulants: Secondary | ICD-10-CM

## 2015-11-24 DIAGNOSIS — Z79899 Other long term (current) drug therapy: Secondary | ICD-10-CM

## 2015-11-24 NOTE — Patient Instructions (Signed)
Lab on 05/10/16 MD visit 1-2 weeks after lab

## 2015-11-25 NOTE — Progress Notes (Signed)
Patient ID: [name] Scull, MD, male   DOB: 1924-12-31, 82 y.o.   MRN: [id number] Hematology and Oncology Follow Up Visit  [name] KOZA, MD [id number] 08/07/1925 82 y.o. 11/25/2015 10:17 AM   Principle Diagnosis: Encounter Diagnoses  Name Primary?  . Prostate cancer ([place])   . Monoclonal gammopathy   . Renal insufficiency   . Drug therapy   . Chronic anticoagulation   . 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  He was diagnosed with IgG kappa monoclonal gammopathy of undetermined significance initially diagnosed in July 2000 and localized prostate cancer treated with radiofrequency ablation also diagnosed in 2000.  both of these disorders have been remarkably stable over time. I am monitoring lab work on an every six-month basis. There have been little changes.   He has developed chronic renal insufficiency over time making some of the lab tests are to interpret with respect to progression to myeloma.  Main active problem relates to his atrial fibrillation. Initial onset in 2011. He has had 5 cardioversion procedures most recent on 03/12/2015. He is currently back in normal sinus rhythm as of most recent cardiogram done 04/16/2015. Arrhythmias now well-controlled on amiodarone. Beta blockers discontinued due to intolerance. (Primarily fatigue and unsteady gait ). He is on chronic anticoagulation with Pradaxa.  He has had 2 separate falls,  first occurred in July 2015 when he fell in his backyard while gardening. He sustained multiple lacerations of his face. Bilateral LeFort I nasal and maxillary sinus fractures. Fracture of the bones of his left hand, Left chest wall contusion. Left fifth finger laceration. He ultimately required corrective surgery on his facial bones by Dr. Constance Holster and open reduction internal fixation of the left hand fracture by Dr. Amedeo Plenty both in August  2015. He fell again at home in February 2016 and fractured his right clavicle. He has been under the care of Dr. Noemi Chapel orthopedic surgery.   He was referred to neurology seen in August 2016 by Dr. Erlinda Hong for further evaluation of progressive tremor and loss of fine coordination as well as gait disturbance. He also has sensorineural hearing deficit. Neurologist concerned that he may have early Parkinson's disease.   A trial of low-dose primidone prescribed for his essential tremor given patient's intolerance of beta blockers. The patient reports no improvement.    Interim History:     Recent neurology evaluation see last paragraph above. He was dissatisfied with that evaluation and would like a second opinion. His essential tremor has improved when he stopped beta blockers. No improvement on low-dose primidone. The main problem he has is writing , he is especially disturbed that he can sign his name on checks are other documents. He reports increasing problems with memory. Although he admits that he may have some signs of early Parkinson's, he would like a more thorough evaluation in this regard.   Fortunately, he has not had any more falls.   He has had some problems with his eyes. The eyelids flutter especially in the mornings. His ophthalmologist felt that he may have develop some corneal abrasions and prescribed a topical medication.   He has had problems with musculoskeletal pain primarily in his elbows. I recommended that he use extra strength Tylenol for some relief. We need to avoid nonsteroidals in view of his chronic renal insufficiency.  Medications: reviewed  Allergies:  Allergies  Allergen Reactions  . Chlorhexidine  Rash  . Guaifenesin & Derivatives Itching  . Keflex [Cephalexin] Diarrhea  . Oxycodone Itching and Rash  . Oxycontin [Oxycodone Hcl] Itching and Rash  . Penicillins Other (See Comments)    Caused fungal reaction    Review of Systems:  see history of present illness   Remaining ROS negative:   Physical Exam: Blood pressure 163/59, pulse 58, temperature 98.1 F (36.7 C), temperature source Oral, weight 191 lb 8 oz (86.864 kg), SpO2 98 %. Wt Readings from Last 3 Encounters:  11/24/15 191 lb 8 oz (86.864 kg)  10/27/15 186 lb 4.8 oz (84.505 kg)  07/28/15 190 lb 3.2 oz (86.274 kg)     General appearance:  Well-nourished Caucasian man HENNT: Pharynx no erythema, exudate, mass, or ulcer. No thyromegaly or thyroid nodules Lymph nodes: No cervical, supraclavicular, or  Right axillary lymphadenopathy. Persistent 2 cm node palpable left axilla unchanged. Breasts:  Lungs: Clear to auscultation, resonant to percussion throughout Heart: Regular rhythm,  2/6 systolic murmur heard at the second right intercostal space and at the cardiac apex consistent with both aortic and mitral valve disease, no gallop, no rub, no click,  Trace pedal edema Abdomen: Soft, nontender, normal bowel sounds, no mass, no organomegaly Extremities: No edema, no calf tenderness Musculoskeletal: no joint deformities GU:  Vascular: Carotid pulses 2+, no bruits, distal pulses: Dorsalis pedis 1+ symmetric Neurologic: Alert, oriented, hard of hearing, PERRLA, optic discs sharp and vessels normal, no hemorrhage or exudate, cranial nerves grossly normal, motor strength 5 over 5, reflexes 1+ symmetric, upper body coordination normal, he does have difficulty getting up from a lying or sitting position. gait normal, tandem walking not tested. Mild resting tremor. I was not impressed that he had a significant intention tremor. Skin: No rash or ecchymosis  Lab Results: CBC W/Diff    Component Value Date/Time   WBC 6.8 11/13/2015 0820   WBC 8.0 10/27/2015 0932   WBC 7.4 01/14/2014 0804   RBC 3.54* 11/13/2015 0820   RBC 3.60* 10/27/2015 0932   RBC 3.66* 01/14/2014 0804   HGB 12.0* 10/27/2015 0932   HGB 12.1* 01/14/2014 0804   HCT 36.4* 11/13/2015 0820   HCT 36.3* 10/27/2015 0932   HCT 36.5*  01/14/2014 0804   PLT 279 11/13/2015 0820   PLT 277 10/27/2015 0932   PLT 199 01/14/2014 0804   MCV 103* 11/13/2015 0820   MCV 100.8* 10/27/2015 0932   MCV 99.8* 01/14/2014 0804   MCH 33.3* 11/13/2015 0820   MCH 33.3 10/27/2015 0932   MCH 33.2 01/14/2014 0804   MCHC 32.4 11/13/2015 0820   MCHC 33.1 10/27/2015 0932   MCHC 33.3 01/14/2014 0804   RDW 14.1 11/13/2015 0820   RDW 14.1 10/27/2015 0932   RDW 13.7 01/14/2014 0804   LYMPHSABS 2.1 11/13/2015 0820   LYMPHSABS 2.7 05/14/2015 0955   LYMPHSABS 2.6 01/14/2014 0804   MONOABS 0.8 05/14/2015 0955   MONOABS 0.7 01/14/2014 0804   EOSABS 0.2 11/13/2015 0820   EOSABS 0.1 05/14/2015 0955   EOSABS 0.3 01/14/2014 0804   BASOSABS 0.0 11/13/2015 0820   BASOSABS 0.0 05/14/2015 0955   BASOSABS 0.0 01/14/2014 0804     Chemistry      Component Value Date/Time   NA 141 11/13/2015 0820   NA 138 10/27/2015 0932   NA 139 01/14/2014 0804   K 4.8 11/13/2015 0820   K 4.8 01/14/2014 0804   CL 104 11/13/2015 0820   CL 107 03/19/2013 0949   CO2 24 11/13/2015 0820  CO2 25 01/14/2014 0804   BUN 19 11/13/2015 0820   BUN 26* 10/27/2015 0932   BUN 16.7 01/14/2014 0804   CREATININE 1.53* 11/13/2015 0820   CREATININE 1.59* 10/27/2015 0932   CREATININE 1.7* 01/14/2014 0804   CREATININE 1.59* 09/27/2013 0848      Component Value Date/Time   CALCIUM 8.9 11/13/2015 0820   CALCIUM 9.4 01/14/2014 0804   ALKPHOS 46 11/13/2015 0820   ALKPHOS 45 01/14/2014 0804   AST 26 11/13/2015 0820   AST 21 01/14/2014 0804   ALT 20 11/13/2015 0820   ALT 20 01/14/2014 0804   BILITOT 0.5 11/13/2015 0820   BILITOT 0.4 10/27/2015 0932   BILITOT 0.71 01/14/2014 0804    IgG: 2193 = stable Kappa free light chains: 6.37 = stable c/w 3/16 ;  lambda 1.12;  ratio: 5.69 Creatinine 1.53 stable; albumin 3.6; Ca++ 8.9 PSA 2.9 = stable 11/13/15 Radiological Studies: Dg Chest 2 View  10/27/2015  CLINICAL DATA:  Amiodarone therapy, coronary artery disease, no current  chest complaints, former smoker. EXAM: CHEST  2 VIEW COMPARISON:  Chest x-ray of January 21, 2014 FINDINGS: The lungs are well-expanded. There is no focal infiltrate. There is no pleural effusion. The pulmonary interstitial markings are normal. The heart and pulmonary vascularity are normal. The mediastinum is normal in width. There is no pleural effusion. There is multilevel degenerative disc disease of the thoracic spine with chronic mild anterior wedging of T12. IMPRESSION: There is no active cardiopulmonary disease. No pulmonary fibrotic changes are observed either. Electronically Signed   By: David  Martinique M.D.   On: 10/27/2015 10:37    Impression:   #1. IgG kappa monoclonal gammopathy of undetermined significance. Initial diagnosis July 2000.  Stable hemoglobin, total IgG within range of previous values but trend for increase in the serum kappa free light chains. Plateau in value compared with last value 3/16. No immediate concern and I will keep him on a 6 month interval laboratory follow-up.  #2. Localized prostate cancer treated with radiofrequency ablation at time of diagnosis in February of 2000. Stable, but elevated, PSA with minor fluctuations. Current value lower than previous at 2.9. He continues annual follow-up with Dr. Gaynelle Arabian. I will continue to monitor PSA on an every six-month basis.  #3. History of 2 isolated episodes of self-limited gross hematuria  This may be a result of anticoagulation with Pradaxa. No recent recurrences.  #4. Single vessel coronary artery disease.   #5. Aortic systolic murmur with mild aortic stenosis and regurgitation on echocardiogram.   #6. Atrial fibrillation  Now status post cardioversion 5. Back in sinus rhythm for the moment. He continues on full dose anticoagulation with Pradaxa 75 mg twice daily , aspirin 81 mg daily, amiodarone 200 mg mg Daily. Lopressor 25 mg twice a day was stopped  April 16, 2015  cardiology visit in view of  bradycardia. Significant improvement in his symptoms of fatigue and gait disturbance since then.  #7. GERD   #8. Hyperlipidemia   #9. Status post excision multiple basal cell carcinomas   #10. Degenerative arthritis of the spine.   #11. Remote history of a transient ischemic attack.   #12. Orthopedic problems with right shoulder for arthroscopy and status post rotator cuff repair 01/23/2014.  #13.Bilateral traumatic LeFort I fractures of the maxillary bones and fracture of the left wrist metacarpal bone requiring surgery at both sites. 7/15  #14. Nondisplaced fracture right clavicle February 2016.  #15. Unsteady gait, memory loss, and progression of familial tremor:  Some component of familial essential tremor. Another component related to medications improved off beta           blockers. Patient would like a second opinion with respect to possibility of early Parkinson's disease.   #16. Chronic renal insufficiency estimated GFR 40. Peak creatinine up to 1.9 in August 2015, now back to baseline at         1.5.   #17. Mitral regurgitation murmur.  CC: Patient Care Team: Annia Belt, MD as PCP - General (Oncology) Lennon Alstrom, MD (Neurology) Ronald Lobo, MD (Gastroenterology) Carolan Clines, MD (Urology) Carlena Bjornstad, MD as Consulting Physician (Cardiology)   Annia Belt, MD 1/10/201710:17 AM

## 2015-12-09 ENCOUNTER — Ambulatory Visit (INDEPENDENT_AMBULATORY_CARE_PROVIDER_SITE_OTHER): Payer: Medicare Other | Admitting: Nurse Practitioner

## 2015-12-09 ENCOUNTER — Other Ambulatory Visit: Payer: Self-pay | Admitting: Nurse Practitioner

## 2015-12-09 ENCOUNTER — Telehealth: Payer: Self-pay | Admitting: Cardiology

## 2015-12-09 ENCOUNTER — Encounter: Payer: Self-pay | Admitting: Nurse Practitioner

## 2015-12-09 VITALS — BP 138/80 | HR 107 | Ht 69.0 in | Wt 188.8 lb

## 2015-12-09 DIAGNOSIS — Z7901 Long term (current) use of anticoagulants: Secondary | ICD-10-CM

## 2015-12-09 DIAGNOSIS — I350 Nonrheumatic aortic (valve) stenosis: Secondary | ICD-10-CM

## 2015-12-09 DIAGNOSIS — I480 Paroxysmal atrial fibrillation: Secondary | ICD-10-CM | POA: Diagnosis not present

## 2015-12-09 LAB — BASIC METABOLIC PANEL
BUN: 19 mg/dL (ref 7–25)
CO2: 28 mmol/L (ref 20–31)
Calcium: 9 mg/dL (ref 8.6–10.3)
Chloride: 105 mmol/L (ref 98–110)
Creat: 1.63 mg/dL — ABNORMAL HIGH (ref 0.70–1.11)
Glucose, Bld: 94 mg/dL (ref 65–99)
Potassium: 4.4 mmol/L (ref 3.5–5.3)
Sodium: 138 mmol/L (ref 135–146)

## 2015-12-09 LAB — CBC
HCT: 38.7 % — ABNORMAL LOW (ref 39.0–52.0)
Hemoglobin: 12.8 g/dL — ABNORMAL LOW (ref 13.0–17.0)
MCH: 33.6 pg (ref 26.0–34.0)
MCHC: 33.1 g/dL (ref 30.0–36.0)
MCV: 101.6 fL — ABNORMAL HIGH (ref 78.0–100.0)
MPV: 10 fL (ref 8.6–12.4)
Platelets: 305 10*3/uL (ref 150–400)
RBC: 3.81 MIL/uL — ABNORMAL LOW (ref 4.22–5.81)
RDW: 14 % (ref 11.5–15.5)
WBC: 9 10*3/uL (ref 4.0–10.5)

## 2015-12-09 MED ORDER — AMIODARONE HCL 200 MG PO TABS: 200.0000 mg | ORAL_TABLET | Freq: Every day | ORAL | Status: DC

## 2015-12-09 NOTE — Patient Instructions (Addendum)
We will be checking the following labs today - BMET, CBC, PT, PTT   Medication Instructions:    Continue with your current medicines. BUT  Increase the amiodarone to 200 mg each day    Testing/Procedures To Be Arranged:  Cardioversion with Dr. Harrington Challenger this Friday  Follow-Up:   See Dr. Stanford Breed in a couple of weeks     Other Special Instructions:  Your provider has recommended a cardioversion.   You are scheduled for a cardioversion on Friday, January 28th at 12 noon with Dr. Harrington Challenger or associates. Please go to [facility] [place] [facility] at Friday, January 28th at 10:30AM.  Enter through the [place] not have any food or drink after midnight on Thursday.  You may take your medicines with a sip of water on the day of your procedure.  You will need someone to drive you home following your procedure.   Call the [place] office at [phone] if you have any questions, problems or concerns.     Electrical Cardioversion Electrical cardioversion is the delivery of a jolt of electricity to change the rhythm of the heart. Sticky patches or metal paddles are placed on the chest to deliver the electricity from a device. This is done to restore a normal rhythm. A rhythm that is too fast or not regular keeps the heart from pumping well. Electrical cardioversion is done in an emergency if:  There is low or no blood pressure as a result of the heart rhythm.  Normal rhythm must be restored as fast as possible to protect the brain and heart from further damage.  It may save a life. Cardioversion may be done for heart rhythms that are not immediately life threatening, such as atrial fibrillation or flutter, in which:  The heart is beating too fast or is not regular.  Medicine to change the rhythm has not worked.  It is safe to wait in order to allow time for preparation. Symptoms of the abnormal rhythm are bothersome. The risk of  stroke and other serious problems can be reduced.  LET [facility] CARE PROVIDER KNOW ABOUT:  Any allergies you have. All medicines you are taking, including vitamins, herbs, eye drops, creams, and over-the-counter medicines. Previous problems you or members of your family have had with the use of anesthetics.  Any blood disorders you have.  Previous surgeries you have had.  Medical conditions you have.   RISKS AND COMPLICATIONS  Generally, this is a safe procedure. However, problems can occur and include:  Breathing problems related to the anesthetic used. A blood clot that breaks free and travels to other parts of your body. This could cause a stroke or other problems. The risk of this is lowered by use of blood-thinning medicine (anticoagulant) prior to the procedure. Cardiac arrest (rare).   BEFORE THE PROCEDURE  You may have tests to detect blood clots in your heart and to evaluate heart function. You may start taking anticoagulants so your blood does not clot as easily.  Medicines may be given to help stabilize your heart rate and rhythm.   PROCEDURE You will be given medicine through an IV tube to reduce discomfort and make you sleepy (sedative).  An electrical shock will be delivered.   AFTER THE PROCEDURE Your heart rhythm will be watched to make sure it does not change. You will need someone to drive you home.       If you need a  refill on your cardiac medications before your next appointment, please call your pharmacy.   Call the [place] office at [phone] if you have any questions, problems or concerns.

## 2015-12-09 NOTE — Telephone Encounter (Signed)
Arrange ov today to document atrial fibrillation; if so arrange DCCV. Kirk Ruths

## 2015-12-09 NOTE — Telephone Encounter (Signed)
Spoke with pt, he went into atrial fib last night and remains out of rhythm this morning. He reports his rate is fast about 100. He wants to talk to dr Stanford Breed, will make dr Stanford Breed aware.

## 2015-12-09 NOTE — Telephone Encounter (Signed)
Spoke with pt, he will see [name] gerhardt np @ 11:45 am for EKG. Patient voiced understanding of appt time and location.

## 2015-12-09 NOTE — Progress Notes (Signed)
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 Atrial Fibrillation  . Irregular Heart Beat    Work in visit - seen for Dr. 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 Seen for Dr. Stanford Breed. He is a retired Engineer, civil (consulting).   He has had paroxysmal atrial fibrillation. Previously followed by Dr. Ron Parker. Had cardiac catheterization in August 2011 that showed nonobstructive coronary disease. Abdominal ultrasound July 2014 show no aneurysm. Had cardioversion April 2016 and is maintained on chronic amiodarone. Other issues include HLD, blood disorder, GERD, valvular heart disease, and prior TIA. Other issues as noted below.   Saw Dr. Stanford Breed last month - felt to be stable. Echo was updated - has grade 3 DD along with severe left and right atrial enlargement and mild AS. No change with his regimen at that time.   Phone call today - "Spoke with pt, he went into atrial fib last night and remains out of rhythm this morning.  He reports his rate is fast about 100. He wants to talk to dr Stanford Breed, will make dr Stanford Breed aware."  Thus added to my schedule for today.   Comes in here today. Here alone. Says he has done ok "until he went out of rhythm". Happened last night. He says this makes him weak. No swelling. Some DOE. No chest pain. He would like to proceed on with cardioversion. He has taken an extra dose of his amiodarone.   Past Medical History  Diagnosis Date  . Coronary artery disease     minimal, cath 2005 / catheterization August, 2011, 30% in 3 vessels, normal LV function  . Hyperlipidemia   . Atrial fibrillation ([place]) 2011, 2015    Multaq Started September, 2011, dose adjusted October, 2011, 200 mg a.m., 400 mg p.o.  . Monoclonal  gammopathy     granfortuna  . TIA (transient ischemic attack)     Dr. Erling Cruz,, question in the past. when off ASA for 10 days  . GERD (gastroesophageal reflux disease)     Esophageal dilatation in 1992, some esophageal spasm  . S/P transurethral resection of prostate 2004    2004  . MR (mitral regurgitation) 2009    mild, Echo, July, 2011  . Cervical spine disease   . Ejection fraction     EF 60%, echo, 2009  /  TEE normal August, 2011 /   EF 60%, echo, July, 2011  . Drug therapy     Pradaxa Started July, 2011  . Aortic stenosis     Mild, echo, July, 2011  . Aortic insufficiency     Mild, echo, July, 2011  . Carotid artery disease ([place])     Doppler, 2008 no significant abnormality  . Diarrhea     Chronic  . Incomplete right bundle branch block     August, 2012  . Prostate cancer ([place]) 11/22/2011  . Painless hematuria 03/27/2012    Occurred x 2 5/12 & 5/13 on Pradaxa 75 mg BID  . Bradycardia     Sinus bradycardia while on 25 Lopressor twice a day October, 2013  . Memory change     June, 2014  . Leg fatigue     June, 2014  .  Anemia   . Renal insufficiency     Prior creatinine 1.2 /  September, 2011.. 1.7  /  October, 2011.. 1.8  . Chronic anticoagulation 01/27/2015    pradaxa 75 mg BID for A fib    Past Surgical History  Procedure Laterality Date  . History of turp    . Hernia repair    . Cataract extraction    . Fibular fracture repair    . Cardioversion  08/01/2012    Procedure: CARDIOVERSION;  Surgeon: Carlena Bjornstad, MD;  Location: [facility] ENDOSCOPY;  Service: Cardiovascular;  Laterality: N/A;  . Cardioversion N/A 11/29/2013    Procedure: CARDIOVERSION;  Surgeon: Carlena Bjornstad, MD;  Location: [facility] ENDOSCOPY;  Service: Cardiovascular;  Laterality: N/A;  . Colonoscopy    . Cardiac catheterization    . Eye surgery Bilateral     cataract extraction  . Shoulder arthroscopy with rotator cuff repair Right 01/23/2014    DR Noemi Chapel   . Shoulder arthroscopy with rotator cuff repair  Right 01/23/2014    Procedure: RIGHT SHOULDER ARTHROSCOPY WITH DEBRIDEMENT AND ROTATOR CUFF REPAIR;  Surgeon: Lorn Junes, MD;  Location: [place];  Service: Orthopedics;  Laterality: Right;  . Orif orbital fracture Bilateral 06/22/2014    Procedure: OPEN REDUCTION INTERNAL FIXATION (ORIF) BILATERAL LEFORTE1 FRACTURE OF MAXILLA, HYBRID ARCH BARS ;  Surgeon: Izora Gala, MD;  Location: [place];  Service: ENT;  Laterality: Bilateral;  . Open reduction internal fixation (orif) metacarpal Left 06/22/2014    Procedure: OPEN REDUCTION INTERNAL FIXATION (ORIF) LEFT HAND METACARPAL;  Surgeon: Roseanne Kaufman, MD;  Location: [place];  Service: Orthopedics;  Laterality: Left;  . Cardioversion N/A 11/27/2014    Procedure: CARDIOVERSION;  Surgeon: Carlena Bjornstad, MD;  Location: [facility] ENDOSCOPY;  Service: Cardiovascular;  Laterality: N/A;  . Cardioversion N/A 03/12/2015    Procedure: CARDIOVERSION;  Surgeon: Carlena Bjornstad, MD;  Location: [facility] ENDOSCOPY;  Service: Cardiovascular;  Laterality: N/A;     Medications: Current Outpatient Prescriptions  Medication Sig Dispense Refill  . amiodarone (PACERONE) 200 MG tablet Take 1 tablet (200 mg total) by mouth daily. Alternate 1 tablet (200mg ) with 1/2 tablet (100mg ) by mouth daily 75 tablet 1  . aspirin EC 81 MG tablet Take 81 mg by mouth every Monday, Wednesday, and Friday.    Marland Kitchen atorvastatin (LIPITOR) 10 MG tablet Take 10 mg by mouth daily at 6 PM.   1  . dabigatran (PRADAXA) 75 MG CAPS capsule Take 1 capsule (75 mg total) by mouth every 12 (twelve) hours. 180 capsule 3   No current facility-administered medications for this visit.    Allergies: Allergies  Allergen Reactions  . Chlorhexidine Rash  . Guaifenesin & Derivatives Itching  . Keflex [Cephalexin] Diarrhea  . Oxycodone Itching and Rash  . Oxycontin [Oxycodone Hcl] Itching and Rash  . Penicillins Other (See Comments)    Caused fungal reaction    Social History: The patient  reports that he has quit  smoking. He has never used smokeless tobacco. He reports that he drinks about 1.2 oz of alcohol per week. He reports that he does not use illicit drugs.   Family History: The patient's family history includes Kidney failure in his father; Stroke in his mother.   Review of Systems: Please see the history of present illness.   Otherwise, the review of systems is positive for none.   All other systems are reviewed and negative.   Physical Exam: VS:  BP 138/80 mmHg  Pulse 107  Ht 5\' 9"  (1.753 m)  Wt 188 lb 12.8 oz (85.639 kg)  BMI 27.87 kg/m2 .  BMI Body mass index is 27.87 kg/(m^2).  Wt Readings from Last 3 Encounters:  12/09/15 188 lb 12.8 oz (85.639 kg)  11/24/15 191 lb 8 oz (86.864 kg)  10/27/15 186 lb 4.8 oz (84.505 kg)    General: Elderly white male who is alert and in no acute distress.  HEENT: Normal. Neck: Supple, no JVD, carotid bruits, or masses noted.  Cardiac: Irregular irregular rhythm. Rate is ok. He has an outfow murmur. No edema.  Respiratory:  Lungs are clear to auscultation bilaterally with normal work of breathing.  GI: Soft and nontender.  MS: No deformity or atrophy. Gait and ROM intact. Skin: Warm and dry. Color is normal.  Neuro:  Strength and sensation are intact and no gross focal deficits noted.  Psych: Alert, appropriate and with normal affect.   LABORATORY DATA:  EKG:  EKG is ordered today. This demonstrates atrial fib with VR of 107.  Lab Results  Component Value Date   WBC 6.8 11/13/2015   HGB 12.0* 10/27/2015   HCT 36.4* 11/13/2015   PLT 279 11/13/2015   GLUCOSE 100* 11/13/2015   CHOL  06/14/2010    140        ATP III CLASSIFICATION:  <200     mg/dL   Desirable  200-239  mg/dL   Borderline High  >=240    mg/dL   High          TRIG 34 06/14/2010   HDL 57 06/14/2010   LDLCALC  06/14/2010    76        Total Cholesterol/HDL:CHD Risk Coronary Heart Disease Risk Table                     Men   Women  1/2 Average Risk   3.4   3.3   Average Risk       5.0   4.4  2 X Average Risk   9.6   7.1  3 X Average Risk  23.4   11.0        Use the calculated Patient Ratio above and the CHD Risk Table to determine the patient's CHD Risk.        ATP III CLASSIFICATION (LDL):  <100     mg/dL   Optimal  100-129  mg/dL   Near or Above                    Optimal  130-159  mg/dL   Borderline  160-189  mg/dL   High  >190     mg/dL   Very High   ALT 20 11/13/2015   AST 26 11/13/2015   NA 141 11/13/2015   K 4.8 11/13/2015   CL 104 11/13/2015   CREATININE 1.53* 11/13/2015   BUN 19 11/13/2015   CO2 24 11/13/2015   TSH 1.611 10/27/2015   PSA 3.60 05/14/2015   INR 1.5* 03/10/2015    BNP (last 3 results) No results for input(s): BNP in the last 8760 hours.  ProBNP (last 3 results) No results for input(s): PROBNP in the last 8760 hours.   Other Studies Reviewed Today:  Echo Study Conclusions from 10/2015  - Left ventricle: The cavity size was mildly dilated. There was mild concentric hypertrophy. Systolic function was normal. The estimated ejection fraction was in the range of 60% to 65%. Doppler parameters are consistent with a reversible  restrictive pattern, indicative of decreased left ventricular diastolic compliance and/or increased left atrial pressure (grade 3 diastolic dysfunction). Doppler parameters are consistent with high ventricular filling pressure. - Aortic valve: There was mild stenosis. There was mild regurgitation. Valve area (VTI): 1.47 cm^2. Valve area (Vmax): 1.44 cm^2. Valve area (Vmean): 1.34 cm^2. Regurgitation pressure half-time: 640 ms. - Mitral valve: Mildly calcified annulus. There was mild regurgitation. - Left atrium: The atrium was severely dilated. - Right atrium: The atrium was moderately dilated. - Pulmonary arteries: Systolic pressure was moderately increased. PA peak pressure: 44 mm Hg (S).  Assessment/Plan: 1. PAF - has reverted back to AF - I have  increased his amiodarone to 200 mg each day. Lab today. He wishes to proceed with cardioversion and knows Dr. Harrington Challenger - scheduled for Friday. We discussed possible EP referral - says he has already seen Dr. Rayann Heman and does not wish to see again.   2. HTN - BP ok  3. Chronic anticoagulation - no missed doses reported.   4. AS - recent echo noted.  5. Diastolic dysfunction   Current medicines are reviewed with the patient today.  The patient does not have concerns regarding medicines other than what has been noted above.  The following changes have been made:  See above.  Labs/ tests ordered today include:    Orders Placed This Encounter  Procedures  . Basic metabolic panel  . CBC  . Protime-INR  . APTT  . EKG 12-Lead     Disposition:   Cardioversion this Friday. Will arrange follow up with Dr. Stanford Breed.   Patient is agreeable to this plan and will call if any problems develop in the interim.   Signed: Burtis Junes, RN, ANP-C 12/09/2015 12:11 PM  [facility] [address] [place], [place]  [address] Phone: [phone] Fax: [phone]

## 2015-12-10 ENCOUNTER — Ambulatory Visit: Payer: Medicare Other | Admitting: Neurology

## 2015-12-10 LAB — APTT: aPTT: 54 seconds — ABNORMAL HIGH (ref 24–37)

## 2015-12-10 LAB — PROTIME-INR
INR: 1.84 — ABNORMAL HIGH (ref <1.50)
Prothrombin Time: 21.5 seconds — ABNORMAL HIGH (ref 11.6–15.2)

## 2015-12-10 NOTE — Progress Notes (Signed)
Noted  

## 2015-12-11 MED ORDER — SODIUM CHLORIDE 0.9 % IV SOLN
INTRAVENOUS | Status: DC
  Administered 2015-12-12: 11:00:00 via INTRAVENOUS
  Administered 2015-12-12: 500 mL via INTRAVENOUS

## 2015-12-12 ENCOUNTER — Ambulatory Visit (HOSPITAL_COMMUNITY)
Admission: RE | Admit: 2015-12-12 | Discharge: 2015-12-12 | Disposition: A | Discharge status: Home / Self Care | Payer: Medicare Other | Source: Ambulatory Visit | Attending: Internal Medicine | Admitting: Internal Medicine

## 2015-12-12 ENCOUNTER — Ambulatory Visit (HOSPITAL_COMMUNITY): Payer: Medicare Other | Admitting: Anesthesiology

## 2015-12-12 ENCOUNTER — Encounter (HOSPITAL_COMMUNITY): Payer: Self-pay | Admitting: Anesthesiology

## 2015-12-12 ENCOUNTER — Encounter (HOSPITAL_COMMUNITY)
Admission: RE | Disposition: A | Discharge status: Home / Self Care | Payer: Self-pay | Source: Ambulatory Visit | Attending: Internal Medicine

## 2015-12-12 DIAGNOSIS — I2510 Atherosclerotic heart disease of native coronary artery without angina pectoris: Secondary | ICD-10-CM | POA: Diagnosis not present

## 2015-12-12 DIAGNOSIS — Z8673 Personal history of transient ischemic attack (TIA), and cerebral infarction without residual deficits: Secondary | ICD-10-CM | POA: Insufficient documentation

## 2015-12-12 DIAGNOSIS — Z885 Allergy status to narcotic agent status: Secondary | ICD-10-CM | POA: Diagnosis not present

## 2015-12-12 DIAGNOSIS — Z7982 Long term (current) use of aspirin: Secondary | ICD-10-CM | POA: Diagnosis not present

## 2015-12-12 DIAGNOSIS — I340 Nonrheumatic mitral (valve) insufficiency: Secondary | ICD-10-CM | POA: Insufficient documentation

## 2015-12-12 DIAGNOSIS — I4891 Unspecified atrial fibrillation: Secondary | ICD-10-CM | POA: Diagnosis not present

## 2015-12-12 DIAGNOSIS — Z880 Allergy status to penicillin: Secondary | ICD-10-CM | POA: Insufficient documentation

## 2015-12-12 DIAGNOSIS — K219 Gastro-esophageal reflux disease without esophagitis: Secondary | ICD-10-CM | POA: Insufficient documentation

## 2015-12-12 DIAGNOSIS — I10 Essential (primary) hypertension: Secondary | ICD-10-CM | POA: Diagnosis not present

## 2015-12-12 DIAGNOSIS — Z7901 Long term (current) use of anticoagulants: Secondary | ICD-10-CM | POA: Insufficient documentation

## 2015-12-12 DIAGNOSIS — E785 Hyperlipidemia, unspecified: Secondary | ICD-10-CM | POA: Insufficient documentation

## 2015-12-12 DIAGNOSIS — I480 Paroxysmal atrial fibrillation: Secondary | ICD-10-CM | POA: Diagnosis not present

## 2015-12-12 DIAGNOSIS — Z87891 Personal history of nicotine dependence: Secondary | ICD-10-CM | POA: Insufficient documentation

## 2015-12-12 HISTORY — PX: CARDIOVERSION: SHX1299

## 2015-12-12 SURGERY — CARDIOVERSION
Anesthesia: General

## 2015-12-12 MED ORDER — LIDOCAINE HCL (CARDIAC) 20 MG/ML IV SOLN
INTRAVENOUS | Status: DC | PRN
  Administered 2015-12-12: 60 mg via INTRAVENOUS

## 2015-12-12 MED ORDER — PROPOFOL 10 MG/ML IV BOLUS
INTRAVENOUS | Status: DC | PRN
  Administered 2015-12-12: 60 mg via INTRAVENOUS

## 2015-12-12 NOTE — Op Note (Signed)
Pt anesthetized with propofol and lidocaine by anesthesia With pads in AP position pt cardioverted to SR with 200 J synchronized biphasic energy  Procedure withoutcomplication. 12 lead pending

## 2015-12-12 NOTE — Discharge Instructions (Signed)
Monitored Anesthesia Care Monitored anesthesia care is an anesthesia service for a medical procedure. Anesthesia is the loss of the ability to feel pain. It is produced by medicines called anesthetics. It may affect a small area of your body (local anesthesia), a large area of your body (regional anesthesia), or your entire body (general anesthesia). The need for monitored anesthesia care depends your procedure, your condition, and the potential need for regional or general anesthesia. It is often provided during procedures where:   General anesthesia may be needed if there are complications. This is because you need special care when you are under general anesthesia.   You will be under local or regional anesthesia. This is so that you are able to have higher levels of anesthesia if needed.   You will receive calming medicines (sedatives). This is especially the case if sedatives are given to put you in a semi-conscious state of relaxation (deep sedation). This is because the amount of sedative needed to produce this state can be hard to predict. Too much of a sedative can produce general anesthesia. Monitored anesthesia care is performed by one or more health care providers who have special training in all types of anesthesia. You will need to meet with these health care providers before your procedure. During this meeting, they will ask you about your medical history. They will also give you instructions to follow. (For example, you will need to stop eating and drinking before your procedure. You may also need to stop or change medicines you are taking.) During your procedure, your health care providers will stay with you. They will:   Watch your condition. This includes watching your blood pressure, breathing, and level of pain.   Diagnose and treat problems that occur.   Give medicines if they are needed. These may include calming medicines (sedatives) and anesthetics.   Make sure you are  comfortable.  Having monitored anesthesia care does not necessarily mean that you will be under anesthesia. It does mean that your health care providers will be able to manage anesthesia if you need it or if it occurs. It also means that you will be able to have a different type of anesthesia than you are having if you need it. When your procedure is complete, your health care providers will continue to watch your condition. They will make sure any medicines wear off before you are allowed to go home.    This information is not intended to replace advice given to you by your health care provider. Make sure you discuss any questions you have with your health care provider.   Document Released: 07/28/2005 Document Revised: 11/22/2014 Document Reviewed: 12/13/2012 Elsevier Interactive Patient Education 2016 [employer]. Hospital doctor cardioversion is the delivery of a jolt of electricity to change the rhythm of the heart. Sticky patches or metal paddles are placed on the chest to deliver the electricity from a device. This is done to restore a normal rhythm. A rhythm that is too fast or not regular keeps the heart from pumping well. Electrical cardioversion is done in an emergency if:   There is low or no blood pressure as a result of the heart rhythm.   Normal rhythm must be restored as fast as possible to protect the brain and heart from further damage.   It may save a life. Cardioversion may be done for heart rhythms that are not immediately life threatening, such as atrial fibrillation or flutter, in which:   The heart  is beating too fast or is not regular.   Medicine to change the rhythm has not worked.   It is safe to wait in order to allow time for preparation.  Symptoms of the abnormal rhythm are bothersome.  The risk of stroke and other serious problems can be reduced. LET [facility] CARE PROVIDER KNOW ABOUT:   Any allergies you have.  All medicines  you are taking, including vitamins, herbs, eye drops, creams, and over-the-counter medicines.  Previous problems you or members of your family have had with the use of anesthetics.   Any blood disorders you have.   Previous surgeries you have had.   Medical conditions you have. RISKS AND COMPLICATIONS  Generally, this is a safe procedure. However, problems can occur and include:   Breathing problems related to the anesthetic used.  A blood clot that breaks free and travels to other parts of your body. This could cause a stroke or other problems. The risk of this is lowered by use of blood-thinning medicine (anticoagulant) prior to the procedure.  Cardiac arrest (rare). BEFORE THE PROCEDURE   You may have tests to detect blood clots in your heart and to evaluate heart function.  You may start taking anticoagulants so your blood does not clot as easily.   Medicines may be given to help stabilize your heart rate and rhythm. PROCEDURE  You will be given medicine through an IV tube to reduce discomfort and make you sleepy (sedative).   An electrical shock will be delivered. AFTER THE PROCEDURE Your heart rhythm will be watched to make sure it does not change.    This information is not intended to replace advice given to you by your health care provider. Make sure you discuss any questions you have with your health care provider.   Document Released: 10/22/2002 Document Revised: 11/22/2014 Document Reviewed: 05/16/2013 Elsevier Interactive Patient Education [employer].

## 2015-12-12 NOTE — Transfer of Care (Signed)
Immediate Anesthesia Transfer of Care Note  Patient: [name] Scull, MD  Procedure(s) Performed: Procedure(s): CARDIOVERSION (N/A)  Patient Location: Endoscopy Unit  Anesthesia Type:MAC  Level of Consciousness: awake, oriented and patient cooperative  Airway & Oxygen Therapy: Patient Spontanous Breathing and Patient connected to nasal cannula oxygen  Post-op Assessment: Report given to RN and Post -op Vital signs reviewed and stable  Post vital signs: Reviewed  Last Vitals:  Filed Vitals:   12/12/15 1102  BP: 134/107  Pulse: 112  Resp: 14    Complications: No apparent anesthesia complications

## 2015-12-12 NOTE — H&P (View-Only) (Signed)
CARDIOLOGY OFFICE NOTE  Date:  12/09/2015    [name] Scull, MD Date of Birth: [birth year] Medical Record [id number]  PCP:  Annia Belt, MD  Cardiologist:  Stanford Breed    Chief Complaint  Patient presents with  . Atrial Fibrillation  . Irregular Heart Beat    Work in visit - seen for Dr. 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 Seen for Dr. Stanford Breed. He is a retired Engineer, civil (consulting).   He has had paroxysmal atrial fibrillation. Previously followed by Dr. Ron Parker. Had cardiac catheterization in August 2011 that showed nonobstructive coronary disease. Abdominal ultrasound July 2014 show no aneurysm. Had cardioversion April 2016 and is maintained on chronic amiodarone. Other issues include HLD, blood disorder, GERD, valvular heart disease, and prior TIA. Other issues as noted below.   Saw Dr. Stanford Breed last month - felt to be stable. Echo was updated - has grade 3 DD along with severe left and right atrial enlargement and mild AS. No change with his regimen at that time.   Phone call today - "Spoke with pt, he went into atrial fib last night and remains out of rhythm this morning.  He reports his rate is fast about 100. He wants to talk to dr Stanford Breed, will make dr Stanford Breed aware."  Thus added to my schedule for today.   Comes in here today. Here alone. Says he has done ok "until he went out of rhythm". Happened last night. He says this makes him weak. No swelling. Some DOE. No chest pain. He would like to proceed on with cardioversion. He has taken an extra dose of his amiodarone.   Past Medical History  Diagnosis Date  . Coronary artery disease     minimal, cath 2005 / catheterization August, 2011, 30% in 3 vessels, normal LV function  . Hyperlipidemia   . Atrial fibrillation ([place]) 2011, 2015    Multaq Started September, 2011, dose adjusted October, 2011, 200 mg a.m., 400 mg p.o.  . Monoclonal  gammopathy     granfortuna  . TIA (transient ischemic attack)     Dr. Erling Cruz,, question in the past. when off ASA for 10 days  . GERD (gastroesophageal reflux disease)     Esophageal dilatation in 1992, some esophageal spasm  . S/P transurethral resection of prostate 2004    2004  . MR (mitral regurgitation) 2009    mild, Echo, July, 2011  . Cervical spine disease   . Ejection fraction     EF 60%, echo, 2009  /  TEE normal August, 2011 /   EF 60%, echo, July, 2011  . Drug therapy     Pradaxa Started July, 2011  . Aortic stenosis     Mild, echo, July, 2011  . Aortic insufficiency     Mild, echo, July, 2011  . Carotid artery disease ([place])     Doppler, 2008 no significant abnormality  . Diarrhea     Chronic  . Incomplete right bundle branch block     August, 2012  . Prostate cancer ([place]) 11/22/2011  . Painless hematuria 03/27/2012    Occurred x 2 5/12 & 5/13 on Pradaxa 75 mg BID  . Bradycardia     Sinus bradycardia while on 25 Lopressor twice a day October, 2013  . Memory change     June, 2014  . Leg fatigue     June, 2014  .  Anemia   . Renal insufficiency     Prior creatinine 1.2 /  September, 2011.. 1.7  /  October, 2011.. 1.8  . Chronic anticoagulation 01/27/2015    pradaxa 75 mg BID for A fib    Past Surgical History  Procedure Laterality Date  . History of turp    . Hernia repair    . Cataract extraction    . Fibular fracture repair    . Cardioversion  08/01/2012    Procedure: CARDIOVERSION;  Surgeon: Carlena Bjornstad, MD;  Location: [facility] ENDOSCOPY;  Service: Cardiovascular;  Laterality: N/A;  . Cardioversion N/A 11/29/2013    Procedure: CARDIOVERSION;  Surgeon: Carlena Bjornstad, MD;  Location: [facility] ENDOSCOPY;  Service: Cardiovascular;  Laterality: N/A;  . Colonoscopy    . Cardiac catheterization    . Eye surgery Bilateral     cataract extraction  . Shoulder arthroscopy with rotator cuff repair Right 01/23/2014    DR Noemi Chapel   . Shoulder arthroscopy with rotator cuff repair  Right 01/23/2014    Procedure: RIGHT SHOULDER ARTHROSCOPY WITH DEBRIDEMENT AND ROTATOR CUFF REPAIR;  Surgeon: Lorn Junes, MD;  Location: [facility];  Service: Orthopedics;  Laterality: Right;  . Orif orbital fracture Bilateral 06/22/2014    Procedure: OPEN REDUCTION INTERNAL FIXATION (ORIF) BILATERAL LEFORTE1 FRACTURE OF MAXILLA, HYBRID ARCH BARS ;  Surgeon: Izora Gala, MD;  Location: [facility];  Service: ENT;  Laterality: Bilateral;  . Open reduction internal fixation (orif) metacarpal Left 06/22/2014    Procedure: OPEN REDUCTION INTERNAL FIXATION (ORIF) LEFT HAND METACARPAL;  Surgeon: Roseanne Kaufman, MD;  Location: [facility];  Service: Orthopedics;  Laterality: Left;  . Cardioversion N/A 11/27/2014    Procedure: CARDIOVERSION;  Surgeon: Carlena Bjornstad, MD;  Location: [facility] ENDOSCOPY;  Service: Cardiovascular;  Laterality: N/A;  . Cardioversion N/A 03/12/2015    Procedure: CARDIOVERSION;  Surgeon: Carlena Bjornstad, MD;  Location: [facility] ENDOSCOPY;  Service: Cardiovascular;  Laterality: N/A;     Medications: Current Outpatient Prescriptions  Medication Sig Dispense Refill  . amiodarone (PACERONE) 200 MG tablet Take 1 tablet (200 mg total) by mouth daily. Alternate 1 tablet (200mg ) with 1/2 tablet (100mg ) by mouth daily 75 tablet 1  . aspirin EC 81 MG tablet Take 81 mg by mouth every Monday, Wednesday, and Friday.    Marland Kitchen atorvastatin (LIPITOR) 10 MG tablet Take 10 mg by mouth daily at 6 PM.   1  . dabigatran (PRADAXA) 75 MG CAPS capsule Take 1 capsule (75 mg total) by mouth every 12 (twelve) hours. 180 capsule 3   No current facility-administered medications for this visit.    Allergies: Allergies  Allergen Reactions  . Chlorhexidine Rash  . Guaifenesin & Derivatives Itching  . Keflex [Cephalexin] Diarrhea  . Oxycodone Itching and Rash  . Oxycontin [Oxycodone Hcl] Itching and Rash  . Penicillins Other (See Comments)    Caused fungal reaction    Social History: The patient  reports that he has quit  smoking. He has never used smokeless tobacco. He reports that he drinks about 1.2 oz of alcohol per week. He reports that he does not use illicit drugs.   Family History: The patient's family history includes Kidney failure in his father; Stroke in his mother.   Review of Systems: Please see the history of present illness.   Otherwise, the review of systems is positive for none.   All other systems are reviewed and negative.   Physical Exam: VS:  BP 138/80 mmHg  Pulse 107  Ht 5\' 9"  (1.753 m)  Wt 188 lb 12.8 oz (85.639 kg)  BMI 27.87 kg/m2 .  BMI Body mass index is 27.87 kg/(m^2).  Wt Readings from Last 3 Encounters:  12/09/15 188 lb 12.8 oz (85.639 kg)  11/24/15 191 lb 8 oz (86.864 kg)  10/27/15 186 lb 4.8 oz (84.505 kg)    General: Elderly white male who is alert and in no acute distress.  HEENT: Normal. Neck: Supple, no JVD, carotid bruits, or masses noted.  Cardiac: Irregular irregular rhythm. Rate is ok. He has an outfow murmur. No edema.  Respiratory:  Lungs are clear to auscultation bilaterally with normal work of breathing.  GI: Soft and nontender.  MS: No deformity or atrophy. Gait and ROM intact. Skin: Warm and dry. Color is normal.  Neuro:  Strength and sensation are intact and no gross focal deficits noted.  Psych: Alert, appropriate and with normal affect.   LABORATORY DATA:  EKG:  EKG is ordered today. This demonstrates atrial fib with VR of 107.  Lab Results  Component Value Date   WBC 6.8 11/13/2015   HGB 12.0* 10/27/2015   HCT 36.4* 11/13/2015   PLT 279 11/13/2015   GLUCOSE 100* 11/13/2015   CHOL  06/14/2010    140        ATP III CLASSIFICATION:  <200     mg/dL   Desirable  200-239  mg/dL   Borderline High  >=240    mg/dL   High          TRIG 34 06/14/2010   HDL 57 06/14/2010   LDLCALC  06/14/2010    76        Total Cholesterol/HDL:CHD Risk Coronary Heart Disease Risk Table                     Men   Women  1/2 Average Risk   3.4   3.3   Average Risk       5.0   4.4  2 X Average Risk   9.6   7.1  3 X Average Risk  23.4   11.0        Use the calculated Patient Ratio above and the CHD Risk Table to determine the patient's CHD Risk.        ATP III CLASSIFICATION (LDL):  <100     mg/dL   Optimal  100-129  mg/dL   Near or Above                    Optimal  130-159  mg/dL   Borderline  160-189  mg/dL   High  >190     mg/dL   Very High   ALT 20 11/13/2015   AST 26 11/13/2015   NA 141 11/13/2015   K 4.8 11/13/2015   CL 104 11/13/2015   CREATININE 1.53* 11/13/2015   BUN 19 11/13/2015   CO2 24 11/13/2015   TSH 1.611 10/27/2015   PSA 3.60 05/14/2015   INR 1.5* 03/10/2015    BNP (last 3 results) No results for input(s): BNP in the last 8760 hours.  ProBNP (last 3 results) No results for input(s): PROBNP in the last 8760 hours.   Other Studies Reviewed Today:  Echo Study Conclusions from 10/2015  - Left ventricle: The cavity size was mildly dilated. There was mild concentric hypertrophy. Systolic function was normal. The estimated ejection fraction was in the range of 60% to 65%. Doppler parameters are consistent with a reversible  restrictive pattern, indicative of decreased left ventricular diastolic compliance and/or increased left atrial pressure (grade 3 diastolic dysfunction). Doppler parameters are consistent with high ventricular filling pressure. - Aortic valve: There was mild stenosis. There was mild regurgitation. Valve area (VTI): 1.47 cm^2. Valve area (Vmax): 1.44 cm^2. Valve area (Vmean): 1.34 cm^2. Regurgitation pressure half-time: 640 ms. - Mitral valve: Mildly calcified annulus. There was mild regurgitation. - Left atrium: The atrium was severely dilated. - Right atrium: The atrium was moderately dilated. - Pulmonary arteries: Systolic pressure was moderately increased. PA peak pressure: 44 mm Hg (S).  Assessment/Plan: 1. PAF - has reverted back to AF - I have  increased his amiodarone to 200 mg each day. Lab today. He wishes to proceed with cardioversion and knows Dr. Harrington Challenger - scheduled for Friday. We discussed possible EP referral - says he has already seen Dr. Rayann Heman and does not wish to see again.   2. HTN - BP ok  3. Chronic anticoagulation - no missed doses reported.   4. AS - recent echo noted.  5. Diastolic dysfunction   Current medicines are reviewed with the patient today.  The patient does not have concerns regarding medicines other than what has been noted above.  The following changes have been made:  See above.  Labs/ tests ordered today include:    Orders Placed This Encounter  Procedures  . Basic metabolic panel  . CBC  . Protime-INR  . APTT  . EKG 12-Lead     Disposition:   Cardioversion this Friday. Will arrange follow up with Dr. Stanford Breed.   Patient is agreeable to this plan and will call if any problems develop in the interim.   Signed: Burtis Junes, RN, ANP-C 12/09/2015 12:11 PM  [facility] [address] [place],   [address] Phone: [phone] Fax: [phone]

## 2015-12-12 NOTE — Anesthesia Preprocedure Evaluation (Addendum)
Anesthesia Evaluation  Patient identified by MRN, date of birth, ID band Patient awake    Reviewed: Allergy & Precautions, H&P , NPO status , Patient's Chart, lab work & pertinent test results, reviewed documented beta blocker date and time   History of Anesthesia Complications Negative for: history of anesthetic complications  Airway Mallampati: II  TM Distance: >3 FB Neck ROM: Full    Dental no notable dental hx. (+) Teeth Intact, Dental Advisory Given   Pulmonary former smoker,    Pulmonary exam normal breath sounds clear to auscultation       Cardiovascular + CAD and + Peripheral Vascular Disease  + dysrhythmias  Rhythm:Irregular Rate:Abnormal  EF 60%, echo, 2016 echo, Mild AS/AI   Neuro/Psych TIAnegative psych ROS   GI/Hepatic Neg liver ROS, GERD  ,  Endo/Other    Renal/GU Renal disease     Musculoskeletal   Abdominal   Peds  Hematology  (+) anemia ,   Anesthesia Other Findings   Reproductive/Obstetrics                           Anesthesia Physical  Anesthesia Plan  ASA: III  Anesthesia Plan: General   Post-op Pain Management:    Induction: Intravenous  Airway Management Planned: Mask  Additional Equipment:   Intra-op Plan:   Post-operative Plan:   Informed Consent: I have reviewed the patients History and Physical, chart, labs and discussed the procedure including the risks, benefits and alternatives for the proposed anesthesia with the patient or authorized representative who has indicated his/her understanding and acceptance.   Dental advisory given  Plan Discussed with: CRNA  Anesthesia Plan Comments:         Anesthesia Quick Evaluation

## 2015-12-12 NOTE — Anesthesia Procedure Notes (Signed)
Procedure Name: MAC Date/Time: 12/12/2015 11:26 AM Performed by: Jenne Campus Pre-anesthesia Checklist: Patient identified, Emergency Drugs available, Suction available, Patient being monitored and Timeout performed Patient Re-evaluated:Patient Re-evaluated prior to inductionOxygen Delivery Method: Ambu bag

## 2015-12-12 NOTE — Interval H&P Note (Signed)
History and Physical Interval Note:  12/12/2015 11:20 AM  Donivan Scull, MD  has presented today for surgery, with the diagnosis of AFIB  The various methods of treatment have been discussed with the patient and family. After consideration of risks, benefits and other options for treatment, the patient has consented to  Procedure(s): CARDIOVERSION (N/A) as a surgical intervention .  The patient's history has been reviewed, patient examined, no change in status, stable for surgery.  I have reviewed the patient's chart and labs.  Questions were answered to the patient's satisfaction.     Dorris Carnes

## 2015-12-12 NOTE — Anesthesia Postprocedure Evaluation (Signed)
Anesthesia Post Note  Patient: Donivan Scull, MD  Procedure(s) Performed: Procedure(s) (LRB): CARDIOVERSION (N/A)  Patient location during evaluation: PACU Anesthesia Type: General Level of consciousness: sedated Pain management: satisfactory to patient Vital Signs Assessment: post-procedure vital signs reviewed and stable Respiratory status: spontaneous breathing Cardiovascular status: stable Anesthetic complications: no    Last Vitals:  Filed Vitals:   12/12/15 1155 12/12/15 1200  BP: 128/72 145/73  Pulse: 56 55  Temp:    Resp: 12 12    Last Pain: There were no vitals filed for this visit.               Riccardo Dubin

## 2015-12-15 ENCOUNTER — Encounter (HOSPITAL_COMMUNITY): Payer: Self-pay | Admitting: Internal Medicine

## 2015-12-15 ENCOUNTER — Other Ambulatory Visit: Payer: Self-pay | Admitting: Oncology

## 2015-12-15 ENCOUNTER — Telehealth: Payer: Self-pay | Admitting: Neurology

## 2015-12-15 ENCOUNTER — Ambulatory Visit (INDEPENDENT_AMBULATORY_CARE_PROVIDER_SITE_OTHER): Payer: Medicare Other | Admitting: Neurology

## 2015-12-15 VITALS — BP 126/80 | HR 72 | Ht 68.0 in | Wt 192.0 lb

## 2015-12-15 DIAGNOSIS — R2681 Unsteadiness on feet: Secondary | ICD-10-CM

## 2015-12-15 DIAGNOSIS — G250 Essential tremor: Secondary | ICD-10-CM | POA: Diagnosis not present

## 2015-12-15 DIAGNOSIS — G251 Drug-induced tremor: Secondary | ICD-10-CM

## 2015-12-15 MED ORDER — APIXABAN 2.5 MG PO TABS: 2.5000 mg | ORAL_TABLET | Freq: Two times a day (BID) | ORAL | Status: DC

## 2015-12-15 NOTE — Telephone Encounter (Signed)
Jade, please call Dr. [name] and let him know that Dr. Stanford Breed doesn't think its a good idea to use primidone.  Unfortunately, I don't think that the 2nd line drugs for tremor will be very useful in his situation as I think that the main culprit is likely amiodarone causing an increase in his baseline essential tremor.

## 2015-12-15 NOTE — Patient Instructions (Signed)
We will call after we speak with Dr Stanford Breed.

## 2015-12-15 NOTE — Progress Notes (Signed)
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  The patient is seen today as a second opinion at the request of [name] [name].  He previously saw [name] [name].  I do not have those [name].  He more recently saw [name] [name], and I did review those [name].  The patient reports that he is R hand dominant and has had tremor for 20 or 30 years; he notices it most in the R hand but it is in both hands and it has gotten worse over the recent years.  He has a strong family history of tremor, being present in his father, paternal aunt and paternal cousins.  Overall, he has dealt with tremor without medication over the years.  It is just of the recent years where it has become more bothersome.  In the past, he was on metoprolol for atrial fibrillation and found that it caused weakness and really did not want any beta blocker for tremor.  When he saw [name] [name] in August 2016, he was placed on a trial of very low-dose primidone.  The patient did not think that it was very helpful but he never got up to a full tablet (splitting tablet into 1/4's and 1/2's).  There was also a question during that visit on whether or not the patient perhaps had developed Parkinson's disease and pt states that he was skeptical of this diagnosis.  Of note is that the patient was placed on amiodarone on 03/06/2015.  His cardiology [name] were reviewed in detail.  His dose was decreased after a successful cardioversion in May, 2016 and again decreased in September, 2016.  He then went into a-fib again on 12/09/15 and had another cardioversion on 12/12/15.He remains on a maintenance dose of amiodarone of 200 mg daily.     Specific Symptoms:  Tremor: Yes.   Voice: denies this but states that he is more "husky" than used to be Sleep: sleeps well  Vivid Dreams:  No.  Acting out dreams:  No. Wet Pillows: No. (rarely) Postural symptoms:  Yes.    Falls?   Yes.   (2 very serious falls - July 2015 and Nov 2015 - both with fx; other rare falls since 2015 not serious, especially if going down hill or down incline but no trouble up going up hill) Bradykinesia symptoms: difficulty getting out of a chair and stooped posture (per daughter) Loss of smell:  No. Loss of taste:  No. Urinary Incontinence:  No. Difficulty Swallowing:  No. (does have GERD but swallow is normal) Handwriting, micrographia: Yes.   ("I scribble now") Trouble with ADL's:  No., except for putting on pants (will sit down)  Trouble buttoning clothing: Yes.  , but only the small buttons Depression:  No. Memory changes:  Yes.  (some short term loss) Hallucinations:  No.  visual distortions: Yes.   but rarely N/V:  No. Lightheaded:  Yes.   (about a week ago, occurred in the shower and felt near syncopal and not sure if had arrhythmia)  Syncope: No. (except one episode 25 years ago) Diplopia:  Rarely when golfs and drives the ball and ball is "out against the sky"   Neuroimaging has not previously been performed.     ALLERGIES:   Allergies  Allergen Reactions  . Chlorhexidine Rash  . Guaifenesin & Derivatives Itching  . Keflex [Cephalexin] Diarrhea  . Oxycodone Itching and Rash  .  Oxycontin [Oxycodone Hcl] Itching and Rash  . Penicillins Other (See Comments)    Caused fungal reaction Has patient had a PCN reaction causing immediate rash, facial/tongue/throat swelling, SOB or lightheadedness with hypotension: No Has patient had a PCN reaction causing severe rash involving mucus membranes or skin necrosis: No Has patient had a PCN reaction that required hospitalization No Has patient had a PCN reaction occurring within the last 10 years: No If all of the above answers are "NO", then may proceed with Cephalosporin use.     CURRENT MEDICATIONS:  Outpatient Encounter Prescriptions as of 12/15/2015  Medication Sig  . amiodarone (PACERONE) 200 MG tablet Take 1 tablet (200 mg  total) by mouth daily. Alternate 1 tablet (200mg ) with 1/2 tablet (100mg ) by mouth daily (Patient taking differently: Take 200 mg by mouth daily. )  . aspirin EC 81 MG tablet Take 81 mg by mouth every Monday, Wednesday, and Friday.  Marland Kitchen atorvastatin (LIPITOR) 10 MG tablet Take 10 mg by mouth daily at 6 PM.   . dabigatran (PRADAXA) 75 MG CAPS capsule Take 1 capsule (75 mg total) by mouth every 12 (twelve) hours.   No facility-administered encounter medications on file as of 12/15/2015.    PAST MEDICAL HISTORY:   Past Medical History  Diagnosis Date  . Coronary artery disease     minimal, cath 2005 / catheterization August, 2011, 30% in 3 vessels, normal LV function  . Hyperlipidemia   . Atrial fibrillation ([place]) 2011, 2015    Multaq Started September, 2011, dose adjusted October, 2011, 200 mg a.m., 400 mg p.o.  . Monoclonal gammopathy     granfortuna  . TIA (transient ischemic attack)     Dr. Erling [name],, question in the past. when off ASA for 10 days  . GERD (gastroesophageal reflux disease)     Esophageal dilatation in 1992, some esophageal spasm  . S/P transurethral resection of prostate 2004    2004  . MR (mitral regurgitation) 2009    mild, Echo, July, 2011  . Cervical spine disease   . Ejection fraction     EF 60%, echo, 2009  /  TEE normal August, 2011 /   EF 60%, echo, July, 2011  . Drug therapy     Pradaxa Started July, 2011  . Aortic stenosis     Mild, echo, July, 2011  . Aortic insufficiency     Mild, echo, July, 2011  . Carotid artery disease ([place])     Doppler, 2008 no significant abnormality  . Diarrhea     Chronic  . Incomplete right bundle branch block     August, 2012  . Prostate cancer ([place]) 11/22/2011  . Painless hematuria 03/27/2012    Occurred x 2 5/12 & 5/13 on Pradaxa 75 mg BID  . Bradycardia     Sinus bradycardia while on 25 Lopressor twice a day October, 2013  . Memory change     June, 2014  . Leg fatigue     June, 2014  . Anemia   . Renal insufficiency       Prior creatinine 1.2 /  September, 2011.. 1.7  /  October, 2011.. 1.8  . Chronic anticoagulation 01/27/2015    pradaxa 75 mg BID for A fib    PAST SURGICAL HISTORY:   Past Surgical History  Procedure Laterality Date  . History of turp    . Hernia repair    . Cataract extraction    . Fibular fracture repair    . Cardioversion  08/01/2012    Procedure: CARDIOVERSION;  Surgeon: [name] Bjornstad, MD;  Location: [facility] ENDOSCOPY;  Service: Cardiovascular;  Laterality: N/A;  . Cardioversion N/A 11/29/2013    Procedure: CARDIOVERSION;  Surgeon: [name] Bjornstad, MD;  Location: [facility] ENDOSCOPY;  Service: Cardiovascular;  Laterality: N/A;  . Colonoscopy    . Cardiac catheterization    . Eye surgery Bilateral     cataract extraction  . Shoulder arthroscopy with rotator cuff repair Right 01/23/2014    DR [name] [name]   . Shoulder arthroscopy with rotator cuff repair Right 01/23/2014    Procedure: RIGHT SHOULDER ARTHROSCOPY WITH DEBRIDEMENT AND ROTATOR CUFF REPAIR;  Surgeon: [name] Junes, MD;  Location: [place];  Service: Orthopedics;  Laterality: Right;  . Orif orbital fracture Bilateral 06/22/2014    Procedure: OPEN REDUCTION INTERNAL FIXATION (ORIF) BILATERAL LEFORTE1 FRACTURE OF MAXILLA, HYBRID ARCH BARS ;  Surgeon: [name] Gala, MD;  Location: [place];  Service: ENT;  Laterality: Bilateral;  . Open reduction internal fixation (orif) metacarpal Left 06/22/2014    Procedure: OPEN REDUCTION INTERNAL FIXATION (ORIF) LEFT HAND METACARPAL;  Surgeon: [name] Kaufman, MD;  Location: [place];  Service: Orthopedics;  Laterality: Left;  . Cardioversion N/A 11/27/2014    Procedure: CARDIOVERSION;  Surgeon: [name] Bjornstad, MD;  Location: [facility] ENDOSCOPY;  Service: Cardiovascular;  Laterality: N/A;  . Cardioversion N/A 03/12/2015    Procedure: CARDIOVERSION;  Surgeon: [name] Bjornstad, MD;  Location: [place];  Service: Cardiovascular;  Laterality: N/A;  . Cardioversion N/A 12/12/2015    Procedure: CARDIOVERSION;  Surgeon: [name] Records, MD;  Location: [facility] ENDOSCOPY;  Service: Cardiovascular;  Laterality: N/A;    SOCIAL HISTORY:   Social History   Social History  . Marital Status: Widowed    Spouse Name: N/A  . Number of Children: N/A  . Years of Education: N/A   Occupational History  . Not on file.   Social History Main Topics  . Smoking status: Former Research scientist (life sciences)  . Smokeless tobacco: Never Used  . Alcohol Use: 1.2 oz/week    1 Glasses of wine, 1 Shots of liquor per week     Comment: Daily  . Drug Use: No  . Sexual Activity: Not on file   Other Topics Concern  . Not on file   Social History Narrative    FAMILY HISTORY:   Family Status  Relation Status Death Age  . Mother Deceased     renal failure  . Father Deceased     stroke  . Sister Deceased     stroke  . Sister Deceased     heart disease    ROS:  A complete 10 system review of systems was obtained and was unremarkable apart from what is mentioned above.  PHYSICAL EXAMINATION:    VITALS:   Filed Vitals:   12/15/15 0948  BP: 126/80  Pulse: 72  Height: 5\' 8"  (1.727 m)  Weight: 192 lb (87.091 kg)    GEN:  The patient appears stated age and is in NAD. HEENT:  Normocephalic, atraumatic.  The mucous membranes are moist. The superficial temporal arteries are without ropiness or tenderness. CV:  RRR Lungs:  CTAB Neck/HEME:  There are no carotid bruits bilaterally.  Neurological examination:  Orientation: The patient is alert and oriented x3. Fund of knowledge is appropriate.  Recent and remote memory are intact.  Attention and concentration are normal.    Able to name objects and repeat phrases. Cranial nerves: There is good facial symmetry. Pupils are  equal round and reactive to light bilaterally. Fundoscopic exam reveals clear margins bilaterally. Extraocular muscles are intact but has trouble with smooth pursuit and gets diplopia with far L horizontal gaze (but only for short period of time and then vision accomodates). There are no  square wave jerks.  The visual fields are full to confrontational testing. The speech is fluent and clear. The patient is able to make the gutteral sounds without difficulty.  Soft palate rises symmetrically and there is no tongue deviation. Hearing is intact to conversational tone. Sensation: Sensation is intact to light and pinprick throughout (facial, trunk, extremities). Vibration is intact at the bilateral big toe but decreased. There is no extinction with double simultaneous stimulation. There is no sensory dermatomal level identified. Motor: Strength is 5/5 in the bilateral upper and lower extremities.   Shoulder shrug is equal and symmetric.  There is no pronator drift. Deep tendon reflexes: Deep tendon reflexes are 2/4 at the bilateral biceps, triceps, brachioradialis, patella and 1/4 at the bilateral achilles. Plantar responses are downgoing bilaterally.  Movement examination: Tone: There is normal tone in the bilateral upper extremities.  The tone in the lower extremities is normal.  Abnormal movements: Pt does have mild tremor of outstretched hands that increases with intention.  No tremor at rest, even with distraction procedures.  He appears to have mild dyskinetic movements in legs bilaterally.  He is able to pour a full glass of water from one glass to another and spills only a minimal amount of the water.  Tremor is evident with Archimedes spirals. Coordination:  There is no decremation with RAM's, with any form of RAMS, including alternating supination and pronation of the forearm, hand opening and closing, finger taps, heel taps and toe taps. Gait and Station: The patient has mild difficulty arising out of a deep-seated chair without the use of the hands. The patient's stride length is just slightly decreased and wide based.  He does have difficulty ambulating in a tandem fashion.  He is able to heel toe walk.  He is able to stand in the Romberg position but does sway.  Lab Results    Component Value Date   TSH 1.611 10/27/2015     Chemistry      Component Value Date/Time   NA 138 12/09/2015 1215   NA 141 11/13/2015 0820   NA 139 01/14/2014 0804   K 4.4 12/09/2015 1215   K 4.8 01/14/2014 0804   CL 105 12/09/2015 1215   CL 107 03/19/2013 0949   CO2 28 12/09/2015 1215   CO2 25 01/14/2014 0804   BUN 19 12/09/2015 1215   BUN 19 11/13/2015 0820   BUN 16.7 01/14/2014 0804   CREATININE 1.63* 12/09/2015 1215   CREATININE 1.53* 11/13/2015 0820   CREATININE 1.7* 01/14/2014 0804   CREATININE 1.59* 09/27/2013 0848      Component Value Date/Time   CALCIUM 9.0 12/09/2015 1215   CALCIUM 9.4 01/14/2014 0804   ALKPHOS 46 11/13/2015 0820   ALKPHOS 45 01/14/2014 0804   AST 26 11/13/2015 0820   AST 21 01/14/2014 0804   ALT 20 11/13/2015 0820   ALT 20 01/14/2014 0804   BILITOT 0.5 11/13/2015 0820   BILITOT 0.4 10/27/2015 0932   BILITOT 0.71 01/14/2014 0804       ASSESSMENT/PLAN:  1.  Tremor  -It is clear to me based on history that the patient has a long history of essential tremor.  It sounds like this perhaps was exacerbated by the  addition of amiodarone in April, 2016.  I talked to him about the fact that approximately 33% of patients who do not have essential tremor will develop tremor on amiodarone.  A much smaller percentage of patients will develop parkinsonism on amiodarone.  I do not see evidence of parkinsonism in him, however.  I told him that this does not mean that this will not develop in the future, but I did not see it today.  He would like to try something for the tremor.  He does not want to try a beta blocker because it made him feel weak in the past.  He did try incredibly low-dose primidone in the past (25 mg and smaller) and this may be an option, but I told him that this does interact with his pradaxa and I will need to check with his cardiologist.  Often times, at this low dose of primidone, its not a big issue but I defer to [name] [name].  Pt does  state that his Pradaxa may be changed to apixaban but this involves the same problem. 2.  Gait instability  -I do agree with [name] [name] that the patient may have a mild PN and we discussed that today.  It could be from the amiodarone but he also has MGUS.  No DM.  Safety discussed 3.  Will call patient after we hear from his cardiologist on whether okay or not for primidone.

## 2015-12-16 ENCOUNTER — Telehealth: Payer: Self-pay | Admitting: *Deleted

## 2015-12-16 NOTE — Telephone Encounter (Signed)
Patient made aware.

## 2015-12-16 NOTE — Telephone Encounter (Signed)
Received call last week from pt's pcp to obtain PA on pt's pradaxa, which was approved for one year.  Received another call yesterday from pt's pcp to see if patient's insurance would cover eliquis instead.  Advised pcp to send in new rx to pharmacy to see if insurance would pay.  Call made to pharmacy this morning to check on the status of eliquis rx-per pharmacy insurance paid, but pharmacy had to order med as they were out.  Pharmacy also asked to d/c pradaxa rx.Regenia Skeeter, Darlene Cassady1/31/201711:35 AM

## 2015-12-16 NOTE — Addendum Note (Signed)
Addended by: Alinda Dooms A on: 12/16/2015 01:46 PM   Modules accepted: Orders

## 2015-12-16 NOTE — Telephone Encounter (Signed)
Dr [name] called back and asked if he could not take Primidone due to the risk of it interacting with other drugs or because it wouldn't work. Made him aware that the secondary drugs are the ones that would not work due to Amiodarone. The Primidone he can not take because Dr Stanford Breed advised this would not be safe. He states he was willing to take the risk- but I advised Dr Tat would not be willing to prescribe the medication due to the risk.

## 2016-01-12 NOTE — Progress Notes (Signed)
HPI: Follow-up paroxysmal atrial fibrillation. Had cardiac catheterization in August 2011 that showed nonobstructive coronary disease. Abdominal ultrasound July 2014 show no aneurysm. Last echocardiogram December 2016 showed normal LV systolic function, grade 3 diastolic dysfunction, mild aortic stenosis (mean gradient 15 mmHg), mild aortic insufficiency, mild mitral regurgitation, severe left atrial enlargement, moderate right atrial enlargement and moderately elevated pulmonary pressures. Last CR 12/09/15 1.63. Developed recurrent atrial fibrillation and required repeat cardioversion on 12/12/2015. Since last seen, Patient denies dyspnea, chest pain, palpitations, syncope or bleeding.He has a tremor that has worsened and he is very concerned about this.   Current Outpatient Prescriptions  Medication Sig Dispense Refill  . amiodarone (PACERONE) 200 MG tablet Take 200 mg by mouth daily.    Marland Kitchen apixaban (ELIQUIS) 2.5 MG TABS tablet Take 1 tablet (2.5 mg total) by mouth 2 (two) times daily. 60 tablet 11  . aspirin EC 81 MG tablet Take 81 mg by mouth every Monday, Wednesday, and Friday.    Marland Kitchen atorvastatin (LIPITOR) 10 MG tablet Take 10 mg by mouth daily at 6 PM.   1   No current facility-administered medications for this visit.     Past Medical History  Diagnosis Date  . Coronary artery disease     minimal, cath 2005 / catheterization August, 2011, 30% in 3 vessels, normal LV function  . Hyperlipidemia   . Atrial fibrillation ([place]) 2011, 2015    Multaq Started September, 2011, dose adjusted October, 2011, 200 mg a.m., 400 mg p.o.  . Monoclonal gammopathy     granfortuna  . TIA (transient ischemic attack)     Dr. Erling Cruz,, question in the past. when off ASA for 10 days  . GERD (gastroesophageal reflux disease)     Esophageal dilatation in 1992, some esophageal spasm  . S/P transurethral resection of prostate 2004    2004  . MR (mitral regurgitation) 2009    mild, Echo, July, 2011  .  Cervical spine disease   . Ejection fraction     EF 60%, echo, 2009  /  TEE normal August, 2011 /   EF 60%, echo, July, 2011  . Drug therapy     Pradaxa Started July, 2011  . Aortic stenosis     Mild, echo, July, 2011  . Aortic insufficiency     Mild, echo, July, 2011  . Carotid artery disease ([place])     Doppler, 2008 no significant abnormality  . Diarrhea     Chronic  . Incomplete right bundle branch block     August, 2012  . Prostate cancer ([place]) 11/22/2011  . Painless hematuria 03/27/2012    Occurred x 2 5/12 & 5/13 on Pradaxa 75 mg BID  . Bradycardia     Sinus bradycardia while on 25 Lopressor twice a day October, 2013  . Memory change     June, 2014  . Leg fatigue     June, 2014  . Anemia   . Renal insufficiency     Prior creatinine 1.2 /  September, 2011.. 1.7  /  October, 2011.. 1.8  . Chronic anticoagulation 01/27/2015    pradaxa 75 mg BID for A fib    Past Surgical History  Procedure Laterality Date  . History of turp    . Hernia repair    . Cataract extraction    . Fibular fracture repair    . Cardioversion  08/01/2012    Procedure: CARDIOVERSION;  Surgeon: Carlena Bjornstad, MD;  Location: [place];  Service: Cardiovascular;  Laterality: N/A;  . Cardioversion N/A 11/29/2013    Procedure: CARDIOVERSION;  Surgeon: Carlena Bjornstad, MD;  Location: [facility] ENDOSCOPY;  Service: Cardiovascular;  Laterality: N/A;  . Colonoscopy    . Cardiac catheterization    . Eye surgery Bilateral     cataract extraction  . Shoulder arthroscopy with rotator cuff repair Right 01/23/2014    DR Noemi Chapel   . Shoulder arthroscopy with rotator cuff repair Right 01/23/2014    Procedure: RIGHT SHOULDER ARTHROSCOPY WITH DEBRIDEMENT AND ROTATOR CUFF REPAIR;  Surgeon: Lorn Junes, MD;  Location: [place];  Service: Orthopedics;  Laterality: Right;  . Orif orbital fracture Bilateral 06/22/2014    Procedure: OPEN REDUCTION INTERNAL FIXATION (ORIF) BILATERAL LEFORTE1 FRACTURE OF MAXILLA, HYBRID ARCH BARS ;   Surgeon: Izora Gala, MD;  Location: [place];  Service: ENT;  Laterality: Bilateral;  . Open reduction internal fixation (orif) metacarpal Left 06/22/2014    Procedure: OPEN REDUCTION INTERNAL FIXATION (ORIF) LEFT HAND METACARPAL;  Surgeon: Roseanne Kaufman, MD;  Location: [place];  Service: Orthopedics;  Laterality: Left;  . Cardioversion N/A 11/27/2014    Procedure: CARDIOVERSION;  Surgeon: Carlena Bjornstad, MD;  Location: [facility] ENDOSCOPY;  Service: Cardiovascular;  Laterality: N/A;  . Cardioversion N/A 03/12/2015    Procedure: CARDIOVERSION;  Surgeon: Carlena Bjornstad, MD;  Location: [place];  Service: Cardiovascular;  Laterality: N/A;  . Cardioversion N/A 12/12/2015    Procedure: CARDIOVERSION;  Surgeon: Fay Records, MD;  Location: [facility] ENDOSCOPY;  Service: Cardiovascular;  Laterality: N/A;    Social History   Social History  . Marital Status: Widowed    Spouse Name: N/A  . Number of Children: N/A  . Years of Education: N/A   Occupational History  . retired     physician   Social History Main Topics  . Smoking status: Former Research scientist (life sciences)  . Smokeless tobacco: Never Used  . Alcohol Use: 1.2 oz/week    1 Glasses of wine, 1 Shots of liquor per week     Comment: Daily  . Drug Use: No  . Sexual Activity: Not on file   Other Topics Concern  . Not on file   Social History Narrative    History reviewed. No pertinent family history.  ROS: no fevers or chills, productive cough, hemoptysis, dysphasia, odynophagia, melena, hematochezia, dysuria, hematuria, rash, seizure activity, orthopnea, PND, pedal edema, claudication. Remaining systems are negative.  Physical Exam: Well-developed well-nourished in no acute distress.  Skin is warm and dry.  HEENT is normal.  Neck is supple.  Chest is clear to auscultation with normal expansion.  Cardiovascular exam is regular rate and rhythm.  2/6 systolic murmur left sternal border. Abdominal exam nontender or distended. No masses palpated. Extremities show  no edema. neuro grossly intact  ECG Sinus bradycardia at a rate of 56. Left Axis deviation. Right bundle branch block.

## 2016-01-13 ENCOUNTER — Ambulatory Visit (INDEPENDENT_AMBULATORY_CARE_PROVIDER_SITE_OTHER): Payer: Medicare Other | Admitting: Cardiology

## 2016-01-13 ENCOUNTER — Encounter: Payer: Self-pay | Admitting: Cardiology

## 2016-01-13 VITALS — BP 200/80 | HR 56 | Ht 69.0 in | Wt 193.8 lb

## 2016-01-13 DIAGNOSIS — R030 Elevated blood-pressure reading, without diagnosis of hypertension: Secondary | ICD-10-CM | POA: Diagnosis not present

## 2016-01-13 DIAGNOSIS — IMO0001 Reserved for inherently not codable concepts without codable children: Secondary | ICD-10-CM

## 2016-01-13 DIAGNOSIS — I2583 Coronary atherosclerosis due to lipid rich plaque: Secondary | ICD-10-CM

## 2016-01-13 DIAGNOSIS — I350 Nonrheumatic aortic (valve) stenosis: Secondary | ICD-10-CM

## 2016-01-13 DIAGNOSIS — I4891 Unspecified atrial fibrillation: Secondary | ICD-10-CM

## 2016-01-13 DIAGNOSIS — E785 Hyperlipidemia, unspecified: Secondary | ICD-10-CM

## 2016-01-13 DIAGNOSIS — I2510 Atherosclerotic heart disease of native coronary artery without angina pectoris: Secondary | ICD-10-CM | POA: Diagnosis not present

## 2016-01-13 NOTE — Assessment & Plan Note (Signed)
Mild on most recent echocardiogram.

## 2016-01-13 NOTE — Assessment & Plan Note (Signed)
Patient remains in sinus rhythm status post cardioversion. He has a tremor which she is very concerned about. He feels this is familial with a contribution from amiodarone. However he does not want to stop the amiodarone because of his atrial fibrillation. He would like to try Neurontin and he will discuss this with primary care. Continue amiodarone at present dose. Continue apixaban.

## 2016-01-13 NOTE — Assessment & Plan Note (Signed)
Patient's blood pressure is elevated today.However he follows this closely at home and states it is typically in the 120-150/70 range. We will follow and adjust regimen as needed.

## 2016-01-13 NOTE — Assessment & Plan Note (Signed)
Continue statin. 

## 2016-01-13 NOTE — Patient Instructions (Signed)
Your physician wants you to follow-up in: 6 MONTHS WITH DR CRENSHAW You will receive a reminder letter in the mail two months in advance. If you don't receive a letter, please call our office to schedule the follow-up appointment.   If you need a refill on your cardiac medications before your next appointment, please call your pharmacy.  

## 2016-01-31 ENCOUNTER — Other Ambulatory Visit: Payer: Self-pay | Admitting: Oncology

## 2016-01-31 MED ORDER — ALPRAZOLAM 0.25 MG PO TABS
0.1250 mg | ORAL_TABLET | Freq: Three times a day (TID) | ORAL | Status: DC
Start: 2016-01-31 — End: 2016-04-06

## 2016-02-06 ENCOUNTER — Other Ambulatory Visit: Payer: Self-pay | Admitting: Cardiology

## 2016-02-06 MED ORDER — AMIODARONE HCL 200 MG PO TABS: 200.0000 mg | ORAL_TABLET | Freq: Every day | ORAL | Status: DC

## 2016-04-06 ENCOUNTER — Other Ambulatory Visit: Payer: Self-pay | Admitting: Oncology

## 2016-04-06 MED ORDER — ALPRAZOLAM 0.25 MG PO TABS: 0.1250 mg | ORAL_TABLET | Freq: Three times a day (TID) | ORAL | Status: DC

## 2016-04-13 ENCOUNTER — Other Ambulatory Visit: Payer: Self-pay | Admitting: Oncology

## 2016-05-10 ENCOUNTER — Other Ambulatory Visit (INDEPENDENT_AMBULATORY_CARE_PROVIDER_SITE_OTHER): Payer: Medicare Other

## 2016-05-10 DIAGNOSIS — Z7901 Long term (current) use of anticoagulants: Secondary | ICD-10-CM

## 2016-05-10 DIAGNOSIS — D472 Monoclonal gammopathy: Secondary | ICD-10-CM

## 2016-05-10 DIAGNOSIS — Z79899 Other long term (current) drug therapy: Secondary | ICD-10-CM

## 2016-05-10 DIAGNOSIS — C61 Malignant neoplasm of prostate: Secondary | ICD-10-CM

## 2016-05-10 DIAGNOSIS — N289 Disorder of kidney and ureter, unspecified: Secondary | ICD-10-CM

## 2016-05-11 ENCOUNTER — Other Ambulatory Visit: Payer: Self-pay | Admitting: Oncology

## 2016-05-11 LAB — COMPREHENSIVE METABOLIC PANEL
ALBUMIN: 3.8 g/dL (ref 3.5–4.7)
ALK PHOS: 48 IU/L (ref 39–117)
ALT: 23 IU/L (ref 0–44)
AST: 25 IU/L (ref 0–40)
Albumin/Globulin Ratio: 1 — ABNORMAL LOW (ref 1.2–2.2)
BILIRUBIN TOTAL: 0.6 mg/dL (ref 0.0–1.2)
BUN / CREAT RATIO: 11 (ref 10–24)
BUN: 19 mg/dL (ref 8–27)
CHLORIDE: 103 mmol/L (ref 96–106)
CO2: 20 mmol/L (ref 18–29)
Calcium: 9.1 mg/dL (ref 8.6–10.2)
Creatinine, Ser: 1.7 mg/dL — ABNORMAL HIGH (ref 0.76–1.27)
GFR calc Af Amer: 41 mL/min/{1.73_m2} — ABNORMAL LOW (ref >59)
GFR calc non Af Amer: 35 mL/min/{1.73_m2} — ABNORMAL LOW (ref >59)
GLOBULIN, TOTAL: 3.9 g/dL (ref 1.5–4.5)
Glucose: 83 mg/dL (ref 65–99)
Potassium: 4.4 mmol/L (ref 3.5–5.2)
SODIUM: 139 mmol/L (ref 134–144)
Total Protein: 7.7 g/dL (ref 6.0–8.5)

## 2016-05-11 LAB — CBC WITH DIFFERENTIAL/PLATELET
BASOS ABS: 0 10*3/uL (ref 0.0–0.2)
Basos: 0 %
EOS (ABSOLUTE): 0.2 10*3/uL (ref 0.0–0.4)
Eos: 3 %
Hematocrit: 34.4 % — ABNORMAL LOW (ref 37.5–51.0)
Hemoglobin: 11.7 g/dL — ABNORMAL LOW (ref 12.6–17.7)
IMMATURE GRANS (ABS): 0 10*3/uL (ref 0.0–0.1)
Immature Granulocytes: 0 %
LYMPHS: 32 %
Lymphocytes Absolute: 2.3 10*3/uL (ref 0.7–3.1)
MCH: 34.1 pg — AB (ref 26.6–33.0)
MCHC: 34 g/dL (ref 31.5–35.7)
MCV: 100 fL — AB (ref 79–97)
MONOS ABS: 0.8 10*3/uL (ref 0.1–0.9)
Monocytes: 10 %
NEUTROS ABS: 3.9 10*3/uL (ref 1.4–7.0)
Neutrophils: 55 %
PLATELETS: 261 10*3/uL (ref 150–379)
RBC: 3.43 x10E6/uL — ABNORMAL LOW (ref 4.14–5.80)
RDW: 14.4 % (ref 12.3–15.4)
WBC: 7.3 10*3/uL (ref 3.4–10.8)

## 2016-05-11 LAB — PSA: PROSTATE SPECIFIC AG, SERUM: 3 ng/mL (ref 0.0–4.0)

## 2016-05-11 LAB — IGG, IGA, IGM
IGG (IMMUNOGLOBIN G), SERUM: 2623 mg/dL — AB (ref 700–1600)
IgA/Immunoglobulin A, Serum: 86 mg/dL (ref 61–437)
IgM (Immunoglobulin M), Srm: 77 mg/dL (ref 15–143)

## 2016-05-11 LAB — KAPPA/LAMBDA LIGHT CHAINS
IG KAPPA FREE LIGHT CHAIN: 82 mg/L — AB (ref 3.3–19.4)
Ig Lambda Free Light Chain: 13.4 mg/L (ref 5.7–26.3)
Kappa/Lambda FluidC Ratio: 6.12 — ABNORMAL HIGH (ref 0.26–1.65)

## 2016-05-12 ENCOUNTER — Other Ambulatory Visit: Payer: Self-pay

## 2016-05-12 MED ORDER — ATORVASTATIN CALCIUM 10 MG PO TABS: 10.0000 mg | ORAL_TABLET | Freq: Every day | ORAL | Status: DC

## 2016-05-17 ENCOUNTER — Ambulatory Visit: Payer: Medicare Other | Admitting: Oncology

## 2016-05-31 ENCOUNTER — Ambulatory Visit: Payer: Medicare Other | Admitting: Oncology

## 2016-06-01 ENCOUNTER — Ambulatory Visit (INDEPENDENT_AMBULATORY_CARE_PROVIDER_SITE_OTHER): Payer: Medicare Other | Admitting: Oncology

## 2016-06-01 ENCOUNTER — Encounter: Payer: Self-pay | Admitting: Oncology

## 2016-06-01 VITALS — BP 163/65 | HR 56 | Temp 98.6°F | Ht 68.0 in | Wt 195.1 lb

## 2016-06-01 DIAGNOSIS — D472 Monoclonal gammopathy: Secondary | ICD-10-CM

## 2016-06-01 DIAGNOSIS — Z8546 Personal history of malignant neoplasm of prostate: Secondary | ICD-10-CM

## 2016-06-01 DIAGNOSIS — G252 Other specified forms of tremor: Secondary | ICD-10-CM

## 2016-06-01 DIAGNOSIS — Z7901 Long term (current) use of anticoagulants: Secondary | ICD-10-CM

## 2016-06-01 DIAGNOSIS — N289 Disorder of kidney and ureter, unspecified: Secondary | ICD-10-CM

## 2016-06-01 DIAGNOSIS — I4892 Unspecified atrial flutter: Secondary | ICD-10-CM

## 2016-06-01 DIAGNOSIS — C61 Malignant neoplasm of prostate: Secondary | ICD-10-CM

## 2016-06-01 MED ORDER — PRIMIDONE 50 MG PO TABS: 50.0000 mg | ORAL_TABLET | Freq: Every day | ORAL | Status: DC

## 2016-06-01 NOTE — Progress Notes (Signed)
Patient ID: [name] Scull, MD, male   DOB: [birth year], 82 y.o.   MRN: [id number] Hematology and Oncology Follow Up Visit  [name] CUTTLER, MD [id number] 14-Apr-1925 82 y.o. 06/01/2016 1:57 PM   Principle Diagnosis: Encounter Diagnoses  Name Primary?  . Monoclonal gammopathy Yes  . Prostate cancer ([place])   . 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  He was diagnosed with IgG kappa monoclonal gammopathy of undetermined significance in July 2000 and localized prostate cancer treated with radiofrequency ablation also diagnosed in 2000.  Both of these disorders have been remarkably stable over time. I am monitoring lab work on an every six-month basis. There have been little changes.  He has developed chronic renal insufficiency over time making some of the lab tests difficult to interpret with respect to progression to myeloma.   He has chronic atrial fibrillation. Initial onset in 2011. He has had 6 cardioversion procedures most recent on 12/12/2015. He is currently back in normal sinus rhythm as of most recent cardiogram done 04/16/2015. Arrhythmias now well-controlled on amiodarone. Beta blockers discontinued due to intolerance. (Primarily fatigue and unsteady gait)  He ws on chronic anticoagulation with Pradaxa. In view of progressive decline in renal function over time, I recently changed him to apixiban 2.5 mg twice daily. He has asymptomatic valvular heart disease with aortic stenosis and mitral regurgitation murmurs heard on exam. Echocardiogram done 11/12/2015 with ejection fraction 60-65 percent. However, there was grade 3 diastolic dysfunction. Mild aortic valve stenosis. Mild aortic regurgitation. Calcified mitral annulus with mild regurgitation. Severe dilatation of the left atrium, moderate dilatation of the right atrium, increased pulmonary artery pressure 44  mm.  He has had 2 major falls, first occurred in July 2015 when he fell in his backyard while gardening. He sustained multiple lacerations of his face. Bilateral LeFort I nasal and maxillary sinus fractures. Fracture of the bones of his left hand, Left chest wall contusion. Left fifth finger laceration. He ultimately required corrective surgery on his facial bones by Dr. Constance Holster and open reduction internal fixation of the left hand fracture by Dr. Amedeo Plenty both in August 2015. He fell again at home in February 2016 and fractured his right clavicle. He has been under the care of Dr. Noemi Chapel orthopedic surgery.  He was referred to neurology seen in August 2016 by Dr. Erlinda Hong for further evaluation of progressive tremor and loss of fine coordination as well as gait disturbance. He also has sensorineural hearing deficit. Neurologist concerned that he may have early Parkinson's disease.  A trial of low-dose primidone prescribed for his essential tremor given patient's intolerance of beta blockers. The patient reports no improvement.  He obtained a second neurology opinion from Dr. Wells Guiles Tat. She did not find any current signs of parkinsonism. She also recommended the primidone for essential tremor but after consultation with his cardiologist decided against it in view of potential interactions with his anticoagulant.  Interim History:   He just celebrated his 82th birthday the other day. His main concern continues to be his resting tremor. This is affecting his ability to write. It is difficult for him to even sign his name. He enjoys playing the piano and is having more and more difficulty doing this. He admits that he only took the primidone in the past for about a week and at a very low dose. Currently no cardiorespiratory complaints.  Medications: reviewed  Allergies:  Allergies  Allergen Reactions  . Chlorhexidine Rash  . Guaifenesin & Derivatives Itching  . Keflex [Cephalexin] Diarrhea  .  Oxycodone Itching and Rash  . Oxycontin [Oxycodone Hcl] Itching and Rash  . Penicillins Other (See Comments)    Caused fungal reaction Has patient had a PCN reaction causing immediate rash, facial/tongue/throat swelling, SOB or lightheadedness with hypotension: No Has patient had a PCN reaction causing severe rash involving mucus membranes or skin necrosis: No Has patient had a PCN reaction that required hospitalization No Has patient had a PCN reaction occurring within the last 10 years: No If all of the above answers are "NO", then may proceed with Cephalosporin use.     Review of Systems: See interim history Remaining ROS negative:   Physical Exam: Blood pressure 163/65, pulse 56, temperature 98.6 F (37 C), temperature source Oral, height 5\' 8"  (1.727 m), weight 195 lb 1.6 oz (88.497 kg), SpO2 98 %. Wt Readings from Last 3 Encounters:  06/01/16 195 lb 1.6 oz (88.497 kg)  01/13/16 193 lb 12.8 oz (87.907 kg)  12/15/15 192 lb (87.091 kg)     General appearance: Elderly Caucasian man well nourished HENNT: Pharynx no erythema, exudate, mass, or ulcer. No thyromegaly or thyroid nodules Lymph nodes: No cervical, or supraclavicular,lymphadenopathy; persistent 2 cm lymph node left axilla chronic finding unchanged. No right axillary adenopathy. Breasts:  Lungs: Clear to auscultation, resonant to percussion throughout Heart: Regular rhythm, 2/6 aortic systolic murmur second right intercostal space, and 2/6 mitral regurgitant murmur at the apex no gallop, no rub, no click, no edema Abdomen: Soft, nontender, normal bowel sounds, no mass, no organomegaly Extremities: No edema, no calf tenderness Musculoskeletal: no joint deformities GU:  Vascular: Carotid pulses 2+, no bruits,  Neurologic: Alert, oriented, PERRLA, cranial nerves grossly normal, motor strength 5 over 5, reflexes 1+ symmetric, upper body coordination with a mild intention tremor, gait normal, somewhat hesitant due to  arthritis. He is not using a cane. Skin: No rash or ecchymosis  Lab Results: CBC W/Diff    Component Value Date/Time   WBC 7.3 05/10/2016 1103   WBC 9.0 12/09/2015 1215   WBC 7.4 01/14/2014 0804   RBC 3.43* 05/10/2016 1103   RBC 3.81* 12/09/2015 1215   RBC 3.66* 01/14/2014 0804   HGB 12.8* 12/09/2015 1215   HGB 12.1* 01/14/2014 0804   HCT 34.4* 05/10/2016 1103   HCT 38.7* 12/09/2015 1215   HCT 36.5* 01/14/2014 0804   PLT 261 05/10/2016 1103   PLT 305 12/09/2015 1215   PLT 199 01/14/2014 0804   MCV 100* 05/10/2016 1103   MCV 101.6* 12/09/2015 1215   MCV 99.8* 01/14/2014 0804   MCH 34.1* 05/10/2016 1103   MCH 33.6 12/09/2015 1215   MCH 33.2 01/14/2014 0804   MCHC 34.0 05/10/2016 1103   MCHC 33.1 12/09/2015 1215   MCHC 33.3 01/14/2014 0804   RDW 14.4 05/10/2016 1103   RDW 14.0 12/09/2015 1215   RDW 13.7 01/14/2014 0804   LYMPHSABS 2.3 05/10/2016 1103   LYMPHSABS 2.7 05/14/2015 0955   LYMPHSABS 2.6 01/14/2014 0804   MONOABS 0.8 05/14/2015 0955   MONOABS 0.7 01/14/2014 0804   EOSABS 0.2 05/10/2016 1103   EOSABS 0.1 05/14/2015 0955   EOSABS 0.3 01/14/2014 0804   BASOSABS 0.0 05/10/2016 1103   BASOSABS 0.0 05/14/2015 0955   BASOSABS 0.0 01/14/2014 0804     Chemistry      Component Value Date/Time   NA 139 05/10/2016 1103   NA 138 12/09/2015 1215  NA 139 01/14/2014 0804   K 4.4 05/10/2016 1103   K 4.8 01/14/2014 0804   CL 103 05/10/2016 1103   CL 107 03/19/2013 0949   CO2 20 05/10/2016 1103   CO2 25 01/14/2014 0804   BUN 19 05/10/2016 1103   BUN 19 12/09/2015 1215   BUN 16.7 01/14/2014 0804   CREATININE 1.70* 05/10/2016 1103   CREATININE 1.63* 12/09/2015 1215   CREATININE 1.7* 01/14/2014 0804   CREATININE 1.59* 09/27/2013 0848      Component Value Date/Time   CALCIUM 9.1 05/10/2016 1103   CALCIUM 9.4 01/14/2014 0804   ALKPHOS 48 05/10/2016 1103   ALKPHOS 45 01/14/2014 0804   AST 25 05/10/2016 1103   AST 21 01/14/2014 0804   ALT 23 05/10/2016 1103    ALT 20 01/14/2014 0804   BILITOT 0.6 05/10/2016 1103   BILITOT 0.4 10/27/2015 0932   BILITOT 0.71 01/14/2014 0804    Kappa free light chains 82 mg percent. Free light chain ratio 6.12 IgG 2623 mg percent   Radiological Studies: No results found.  Impression:  #1. IgG kappa monoclonal gammopathy of undetermined significance Diagnosed 17 years ago. There is a trend for slowly rising kappa free light chains and light chain ratio with stable renal function but total IgG immunoglobulin, hemoglobin, and platelets remained within range of previous values. We discussed the potential of transformation to active myeloma and treatment options in that eventuality. There is no immediate reason for concern or for treatment. I will repeat lab again in 3 months.  #2. Resting tremor He is now on apixiban.  All of the new oral anticoagulants can interfere with certain medications. He is already on amiodarone which inhibits drug metabolism and can increase anticoagulant levels. The anticonvulsants including primidone accelerate metabolism and may decrease anticoagulant levels. Unfortunately we do not have a good test for drug levels. All of the dosing recommended based on age, renal function, and potential drug interactions, are empiric. The safest medication for him to be on would be warfarin but he does not want to have frequent laboratory monitoring. Given the low doses of primidone that we are recommending, I do not think that the interaction with his apixiban is strong enough to deny him possible benefit since the main thing affecting his quality of life at this time is the tremor. I'm going to put him back on the primidone 50 mg daily 1 week then increase to 100 mg daily. I will not dose escalate the apixiban  unless we have to continue to escalate the dose of primidone. In that case I will increase the dose of the apixiban. We may have a lab test available soon to more reliably assess drug levels of the  new oral anticoagulants. We reviewed the risk versus benefit of stroke prevention from adequate anticoagulation with Coumadin as opposed to the new oral anticoagulants. He understands the risks of staying on the [place].  #3. Chronic atrial fibrillation Currently controlled on amiodarone  #4. Early stage prostate cancer treated with radiofrequency ablation procedure 17 years ago. PSA remains stable with most recent value of 3.0 done 05/10/2016. He continues to follow-up with urology Dr. Gaynelle Arabian.  #5. Asymptomatic valvular heart disease  #6. Chronic, stable, left axillary lymphadenopathy  CC: Patient Care Team: Annia Belt, MD as PCP - General (Oncology) Ronald Lobo, MD (Gastroenterology) Carolan Clines, MD (Urology) Lelon Perla, MD as Consulting Physician (Cardiology)   Annia Belt, MD 7/18/20171:57 PM

## 2016-06-01 NOTE — Patient Instructions (Signed)
Start Primidone 50 mg daily for one week. Increase to 100 mg after 1 week if tolerated Continue Eliquis at same dose for now. Lab in 3 months and 6 months MD visit in 3 months 1 week after lab

## 2016-06-07 ENCOUNTER — Encounter: Payer: Self-pay | Admitting: Cardiology

## 2016-06-16 NOTE — Progress Notes (Signed)
HPI: Follow-up paroxysmal atrial fibrillation. Had cardiac catheterization in August 2011 that showed nonobstructive coronary disease. Abdominal ultrasound July 2014 show no aneurysm. Last echocardiogram December 2016 showed normal LV systolic function, grade 3 diastolic dysfunction, mild aortic stenosis (mean gradient 15 mmHg), mild aortic insufficiency, mild mitral regurgitation, severe left atrial enlargement, moderate right atrial enlargement and moderately elevated pulmonary pressures. Developed recurrent atrial fibrillation and required repeat cardioversion on 12/12/2015. Since last seen, pt denies dyspnea, chest pain, palpitations or syncope. No bleeding.  Current Outpatient Prescriptions  Medication Sig Dispense Refill  . amiodarone (PACERONE) 200 MG tablet Take 1 tablet (200 mg total) by mouth daily. 75 tablet 3  . apixaban (ELIQUIS) 2.5 MG TABS tablet Take 1 tablet (2.5 mg total) by mouth 2 (two) times daily. 60 tablet 11  . aspirin EC 81 MG tablet Take 81 mg by mouth every Monday, Wednesday, and Friday.    Marland Kitchen atorvastatin (LIPITOR) 10 MG tablet Take 1 tablet (10 mg total) by mouth daily at 6 PM. 30 tablet 1  . primidone (MYSOLINE) 50 MG tablet Take 1 tablet (50 mg total) by mouth daily. Take 50 mg daily for one week thenIncrease to 100 mg daily if tolerated 60 tablet 2   No current facility-administered medications for this visit.      Past Medical History:  Diagnosis Date  . Anemia   . Aortic insufficiency    Mild, echo, July, 2011  . Aortic stenosis    Mild, echo, July, 2011  . Atrial fibrillation [facility]) 2011, 2015   Multaq Started September, 2011, dose adjusted October, 2011, 200 mg a.m., 400 mg p.o.  . Bradycardia    Sinus bradycardia while on 25 Lopressor twice a day October, 2013  . Carotid artery disease ([place])    Doppler, 2008 no significant abnormality  . Cervical spine disease   . Chronic anticoagulation 01/27/2015   pradaxa 75 mg BID for A fib  . Coronary  artery disease    minimal, cath 2005 / catheterization August, 2011, 30% in 3 vessels, normal LV function  . Diarrhea    Chronic  . Drug therapy    Pradaxa Started July, 2011  . Ejection fraction    EF 60%, echo, 2009  /  TEE normal August, 2011 /   EF 60%, echo, July, 2011  . GERD (gastroesophageal reflux disease)    Esophageal dilatation in 1992, some esophageal spasm  . Hyperlipidemia   . Incomplete right bundle branch block    August, 2012  . Leg fatigue    June, 2014  . Memory change    June, 2014  . Monoclonal gammopathy    granfortuna  . MR (mitral regurgitation) 2009   mild, Echo, July, 2011  . Painless hematuria 03/27/2012   Occurred x 2 5/12 & 5/13 on Pradaxa 75 mg BID  . Prostate cancer ([place]) 11/22/2011  . Renal insufficiency    Prior creatinine 1.2 /  September, 2011.. 1.7  /  October, 2011.. 1.8  . S/P transurethral resection of prostate 2004   2004  . TIA (transient ischemic attack)    Dr. Erling Cruz,, question in the past. when off ASA for 10 days    Past Surgical History:  Procedure Laterality Date  . CARDIAC CATHETERIZATION    . CARDIOVERSION  08/01/2012   Procedure: CARDIOVERSION;  Surgeon: Carlena Bjornstad, MD;  Location: [facility] ENDOSCOPY;  Service: Cardiovascular;  Laterality: N/A;  . CARDIOVERSION N/A 11/29/2013   Procedure: CARDIOVERSION;  Surgeon:  Carlena Bjornstad, MD;  Location: [facility] ENDOSCOPY;  Service: Cardiovascular;  Laterality: N/A;  . CARDIOVERSION N/A 11/27/2014   Procedure: CARDIOVERSION;  Surgeon: Carlena Bjornstad, MD;  Location: [facility] ENDOSCOPY;  Service: Cardiovascular;  Laterality: N/A;  . CARDIOVERSION N/A 03/12/2015   Procedure: CARDIOVERSION;  Surgeon: Carlena Bjornstad, MD;  Location: [place];  Service: Cardiovascular;  Laterality: N/A;  . CARDIOVERSION N/A 12/12/2015   Procedure: CARDIOVERSION;  Surgeon: Fay Records, MD;  Location: [place];  Service: Cardiovascular;  Laterality: N/A;  . CATARACT EXTRACTION    . COLONOSCOPY    . EYE SURGERY Bilateral     cataract extraction  . Fibular fracture repair    . HERNIA REPAIR    . History of TURP    . OPEN REDUCTION INTERNAL FIXATION (ORIF) METACARPAL Left 06/22/2014   Procedure: OPEN REDUCTION INTERNAL FIXATION (ORIF) LEFT HAND METACARPAL;  Surgeon: Roseanne Kaufman, MD;  Location: [place];  Service: Orthopedics;  Laterality: Left;  . ORIF ORBITAL FRACTURE Bilateral 06/22/2014   Procedure: OPEN REDUCTION INTERNAL FIXATION (ORIF) BILATERAL LEFORTE1 FRACTURE OF MAXILLA, HYBRID ARCH BARS ;  Surgeon: Izora Gala, MD;  Location: [place];  Service: ENT;  Laterality: Bilateral;  . SHOULDER ARTHROSCOPY WITH ROTATOR CUFF REPAIR Right 01/23/2014   DR Noemi Chapel   . SHOULDER ARTHROSCOPY WITH ROTATOR CUFF REPAIR Right 01/23/2014   Procedure: RIGHT SHOULDER ARTHROSCOPY WITH DEBRIDEMENT AND ROTATOR CUFF REPAIR;  Surgeon: Lorn Junes, MD;  Location: [place];  Service: Orthopedics;  Laterality: Right;    Social History   Social History  . Marital status: Widowed    Spouse name: N/A  . Number of children: N/A  . 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 Smoking status: Former Research scientist (life sciences)  . Smokeless tobacco: Never Used  . Alcohol use 1.2 oz/week    1 Glasses of wine, 1 Shots of liquor per week     Comment: Daily  . Drug use: No  . Sexual activity: Not on file   Other Topics Concern  . Not on file   Social History Narrative  . No narrative on file    History reviewed. No pertinent family history.  ROS: no fevers or chills, productive cough, hemoptysis, dysphasia, odynophagia, melena, hematochezia, dysuria, hematuria, rash, seizure activity, orthopnea, PND, pedal edema, claudication. Remaining systems are negative.  Physical Exam: Well-developed well-nourished in no acute distress.  Skin is warm and dry.  HEENT is normal.  Neck is supple.  Chest is clear to auscultation with normal expansion.  Cardiovascular exam is regular rate and rhythm.  2/6 systolic murmur left sternal border. Abdominal exam nontender or distended. No masses palpated. Extremities show no edema. neuro grossly intact  ECG Sinus bradycardia, right bundle branch block, left axis deviation.  A/P  1 Paroxysmal atrial fibrillation-patient remains in sinus rhythm. Continue amiodarone. He had liver functions, hemoglobin and renal function checked recently. Check TSH with next blood draw. Check chest x-ray. Continue apixaban.  2 aortic stenosis-mild on most recent echo. Remains mild on examination. We will plan follow-up echoes in the future.  3 hyperlipidemia-continue statin.  4 cornea artery disease-continue statin.  Kirk Ruths, MD

## 2016-06-25 ENCOUNTER — Encounter: Payer: Self-pay | Admitting: Cardiology

## 2016-06-25 ENCOUNTER — Ambulatory Visit (INDEPENDENT_AMBULATORY_CARE_PROVIDER_SITE_OTHER): Payer: Medicare Other | Admitting: Cardiology

## 2016-06-25 ENCOUNTER — Other Ambulatory Visit: Payer: Self-pay | Admitting: Oncology

## 2016-06-25 VITALS — BP 146/82 | HR 51 | Ht 68.0 in | Wt 192.0 lb

## 2016-06-25 DIAGNOSIS — I350 Nonrheumatic aortic (valve) stenosis: Secondary | ICD-10-CM

## 2016-06-25 DIAGNOSIS — E785 Hyperlipidemia, unspecified: Secondary | ICD-10-CM

## 2016-06-25 DIAGNOSIS — I480 Paroxysmal atrial fibrillation: Secondary | ICD-10-CM | POA: Diagnosis not present

## 2016-06-25 DIAGNOSIS — I2510 Atherosclerotic heart disease of native coronary artery without angina pectoris: Secondary | ICD-10-CM | POA: Diagnosis not present

## 2016-06-25 DIAGNOSIS — Z711 Person with feared health complaint in whom no diagnosis is made: Secondary | ICD-10-CM

## 2016-06-25 NOTE — Patient Instructions (Addendum)
Medication Instructions:  Your physician recommends that you continue on your current medications as directed. Please refer to the Current Medication list given to you today.   Labwork: When you go to PCP make sure doctor checks a thyroid blood test/tsh.  Testing/Procedures: A chest x-ray takes a picture of the organs and structures inside the chest, including the heart, lungs, and blood vessels. This test can show several things, including, whether the heart is enlarges; whether fluid is building up in the lungs; and whether pacemaker / defibrillator leads are still in place.   Follow-Up: Your physician wants you to follow-up in: 6 months with Dr. Stanford Breed.  You will receive a reminder letter in the mail two months in advance. If you don't receive a letter, please call our office to schedule the follow-up appointment.   Any Other Special Instructions Will Be Listed Below (If Applicable).     If you need a refill on your cardiac medications before your next appointment, please call your pharmacy.

## 2016-06-29 ENCOUNTER — Ambulatory Visit
Admission: RE | Admit: 2016-06-29 | Discharge: 2016-06-29 | Disposition: A | Discharge status: Home / Self Care | Payer: Medicare Other | Source: Ambulatory Visit | Attending: Cardiology | Admitting: Cardiology

## 2016-06-29 DIAGNOSIS — I480 Paroxysmal atrial fibrillation: Secondary | ICD-10-CM

## 2016-08-13 ENCOUNTER — Ambulatory Visit (HOSPITAL_COMMUNITY)
Admission: EM | Admit: 2016-08-13 | Discharge: 2016-08-13 | Disposition: A | Discharge status: Home / Self Care | Payer: Medicare Other | Attending: Internal Medicine | Admitting: Internal Medicine

## 2016-08-13 DIAGNOSIS — S0083XA Contusion of other part of head, initial encounter: Secondary | ICD-10-CM

## 2016-08-13 DIAGNOSIS — S51802A Unspecified open wound of left forearm, initial encounter: Secondary | ICD-10-CM | POA: Diagnosis not present

## 2016-08-13 DIAGNOSIS — W19XXXA Unspecified fall, initial encounter: Secondary | ICD-10-CM

## 2016-08-13 DIAGNOSIS — S51812A Laceration without foreign body of left forearm, initial encounter: Secondary | ICD-10-CM

## 2016-08-13 MED ORDER — LIDOCAINE-EPINEPHRINE-TETRACAINE (LET) SOLUTION
NASAL | Status: AC
  Filled 2016-08-13: qty 6

## 2016-08-13 MED ORDER — TETANUS-DIPHTH-ACELL PERTUSSIS 5-2.5-18.5 LF-MCG/0.5 IM SUSP
INTRAMUSCULAR | Status: AC
Start: 2016-08-13 — End: 2016-08-13
  Filled 2016-08-13: qty 0.5

## 2016-08-13 MED ORDER — TETANUS-DIPHTH-ACELL PERTUSSIS 5-2.5-18.5 LF-MCG/0.5 IM SUSP: 0.5000 mL | Freq: Once | INTRAMUSCULAR | Status: DC

## 2016-08-13 MED ORDER — LIDOCAINE-EPINEPHRINE-TETRACAINE (LET) SOLUTION
NASAL | Status: AC
  Filled 2016-08-13: qty 3

## 2016-08-13 NOTE — ED Provider Notes (Signed)
[facility]    CSN: [id number] Arrival date & time: 08/13/16  [id number]     History   Chief Complaint Chief Complaint  Patient presents with  . 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 He presents after falling yesterday, with large abrasion/skin tear to dorsal left forearm, and abrasion/contusion below/lateral to left eye.  Has some tendency to fall and has done PT/balance training for this. Typically falls forward if bent down/leaning forward.  This occurred yesterday while trying to remove stone from shoe, was outside feeding the ducks.  Was able to get up with assistance.  No LOC.  No headache, no neck/back pain.  No change in vision.  Was able to clean and dress wound himself with assistance of housekeeper.  Was able to walk into urgent care with usual assistive device, a cane.  Held the last 2 doses of eliquis.   HPI  Past Medical History:  Diagnosis Date  . Anemia   . Aortic insufficiency    Mild, echo, July, 2011  . Aortic stenosis    Mild, echo, July, 2011  . Atrial fibrillation [facility]) 2011, 2015   Multaq Started September, 2011, dose adjusted October, 2011, 200 mg a.m., 400 mg p.o.  . Bradycardia    Sinus bradycardia while on 25 Lopressor twice a day October, 2013  . Carotid artery disease ([place])    Doppler, 2008 no significant abnormality  . Cervical spine disease   . Chronic anticoagulation 01/27/2015   pradaxa 75 mg BID for A fib  . Coronary artery disease    minimal, cath 2005 / catheterization August, 2011, 30% in 3 vessels, normal LV function  . Diarrhea    Chronic  . Drug therapy    Pradaxa Started July, 2011  . Ejection fraction    EF 60%, echo, 2009  /  TEE normal August, 2011 /   EF 60%, echo, July, 2011  . GERD (gastroesophageal reflux disease)    Esophageal dilatation in 1992, some esophageal spasm  . Hyperlipidemia   . Incomplete right bundle branch block    August, 2012  . Leg fatigue    June, 2014  . Memory change    June,  2014  . Monoclonal gammopathy    granfortuna  . MR (mitral regurgitation) 2009   mild, Echo, July, 2011  . Painless hematuria 03/27/2012   Occurred x 2 5/12 & 5/13 on Pradaxa 75 mg BID  . Prostate cancer ([place]) 11/22/2011  . Renal insufficiency    Prior creatinine 1.2 /  September, 2011.. 1.7  /  October, 2011.. 1.8  . S/P transurethral resection of prostate 2004   2004  . TIA (transient ischemic attack)    Dr. Erling Cruz,, question in the past. when off ASA for 10 days    Patient Active Problem List   Diagnosis Date Noted  . Elevated blood pressure 10/27/2015  . MGUS (monoclonal gammopathy of unknown significance) 06/26/2015  . Essential tremor 06/25/2015  . Right bundle branch block 04/16/2015  . On amiodarone therapy 04/16/2015  . Edema leg 03/21/2015  . Chronic anticoagulation 01/27/2015  . Clavicle fracture 12/16/2014  . LeFort I fracture of maxilla ([place]) 06/22/2014  . Preop cardiovascular exam 06/19/2014  . Facial fracture due to fall ([place]) 06/07/2014  . Rotator cuff tear, right 01/23/2014  . GERD (gastroesophageal reflux disease)   . Skin lesion of back 12/24/2013  . Memory change   . Leg fatigue   . Bradycardia   .  Carotid artery disease ([place])   . Painless hematuria 03/27/2012  . Prostate cancer ([place]) 11/22/2011  . Atrial fibrillation ([place])   . Coronary artery disease   . Hyperlipidemia   . Monoclonal gammopathy   . TIA (transient ischemic attack)   . S/P transurethral resection of prostate   . MR (mitral regurgitation)   . Cervical spine disease   . Renal insufficiency   . Ejection fraction   . Drug therapy   . Aortic stenosis   . Aortic insufficiency     Past Surgical History:  Procedure Laterality Date  . CARDIAC CATHETERIZATION    . CARDIOVERSION  08/01/2012   Procedure: CARDIOVERSION;  Surgeon: Carlena Bjornstad, MD;  Location: [facility] ENDOSCOPY;  Service: Cardiovascular;  Laterality: N/A;  . CARDIOVERSION N/A 11/29/2013   Procedure: CARDIOVERSION;  Surgeon:  Carlena Bjornstad, MD;  Location: [facility] ENDOSCOPY;  Service: Cardiovascular;  Laterality: N/A;  . CARDIOVERSION N/A 11/27/2014   Procedure: CARDIOVERSION;  Surgeon: Carlena Bjornstad, MD;  Location: [facility] ENDOSCOPY;  Service: Cardiovascular;  Laterality: N/A;  . CARDIOVERSION N/A 03/12/2015   Procedure: CARDIOVERSION;  Surgeon: Carlena Bjornstad, MD;  Location: [place];  Service: Cardiovascular;  Laterality: N/A;  . CARDIOVERSION N/A 12/12/2015   Procedure: CARDIOVERSION;  Surgeon: Fay Records, MD;  Location: [place];  Service: Cardiovascular;  Laterality: N/A;  . CATARACT EXTRACTION    . COLONOSCOPY    . EYE SURGERY Bilateral    cataract extraction  . Fibular fracture repair    . HERNIA REPAIR    . History of TURP    . OPEN REDUCTION INTERNAL FIXATION (ORIF) METACARPAL Left 06/22/2014   Procedure: OPEN REDUCTION INTERNAL FIXATION (ORIF) LEFT HAND METACARPAL;  Surgeon: Roseanne Kaufman, MD;  Location: [place];  Service: Orthopedics;  Laterality: Left;  . ORIF ORBITAL FRACTURE Bilateral 06/22/2014   Procedure: OPEN REDUCTION INTERNAL FIXATION (ORIF) BILATERAL LEFORTE1 FRACTURE OF MAXILLA, HYBRID ARCH BARS ;  Surgeon: Izora Gala, MD;  Location: [place];  Service: ENT;  Laterality: Bilateral;  . SHOULDER ARTHROSCOPY WITH ROTATOR CUFF REPAIR Right 01/23/2014   DR Noemi Chapel   . SHOULDER ARTHROSCOPY WITH ROTATOR CUFF REPAIR Right 01/23/2014   Procedure: RIGHT SHOULDER ARTHROSCOPY WITH DEBRIDEMENT AND ROTATOR CUFF REPAIR;  Surgeon: Lorn Junes, MD;  Location: [place];  Service: Orthopedics;  Laterality: Right;       Home Medications    Prior to Admission medications   Medication Sig Start Date End Date Taking? Authorizing Provider  amiodarone (PACERONE) 200 MG tablet Take 1 tablet (200 mg total) by mouth daily. 02/06/16   Lelon Perla, MD  apixaban (ELIQUIS) 2.5 MG TABS tablet Take 1 tablet (2.5 mg total) by mouth 2 (two) times daily. 12/15/15   Annia Belt, MD  aspirin EC 81 MG tablet Take 81 mg by  mouth every Monday, Wednesday, and Friday.    Historical Provider, MD  atorvastatin (LIPITOR) 10 MG tablet Take 1 tablet (10 mg total) by mouth daily at 6 PM. 05/12/16   Lelon Perla, MD  primidone (MYSOLINE) 50 MG tablet Take 1 tablet (50 mg total) by mouth daily. Take 50 mg daily for one week thenIncrease to 100 mg daily if tolerated 06/01/16   Annia Belt, MD    Family History No family history on file.  Social History Social History  Substance Use Topics  . Smoking status: Former Research scientist (life sciences)  . Smokeless tobacco: Never Used  . Alcohol use 1.2 oz/week    1 Glasses  of wine, 1 Shots of liquor per week     Comment: Daily   Retired physician, internal medicine residency program at [employer]  Allergies   Chlorhexidine; Guaifenesin & derivatives; Keflex [cephalexin]; Oxycodone; Oxycontin [oxycodone hcl]; and Penicillins   Review of Systems Review of Systems  All other systems reviewed and are negative.    Physical Exam Triage Vital Signs ED Triage Vitals  Enc Vitals Group     BP 08/13/16 1029 163/83     Pulse Rate 08/13/16 1029 (!) 58     Resp 08/13/16 1029 12     Temp 08/13/16 1029 98.3 F (36.8 C)     Temp Source 08/13/16 1029 Oral     SpO2 08/13/16 1029 99 %     Weight --      Height --      Head Circumference --      Peak Flow --      Pain Score 08/13/16 1040 1     Pain Loc --      Pain Edu? --      Excl. in [place]? --    No data found.   Updated Vital Signs BP 163/83 (BP Location: Left Arm)   Pulse (!) 58 Comment: notified cma  Temp 98.3 F (36.8 C) (Oral)   Resp 12   SpO2 99%   Visual Acuity Right Eye Distance:   Left Eye Distance:   Bilateral Distance:    Right Eye Near:   Left Eye Near:    Bilateral Near:     Physical Exam  Constitutional: He is oriented to person, place, and time. No distress.  Alert, nicely groomed  HENT:  No hemotympanum No nasal septal hematoma Somewhat hard of hearing (has aids but not wearing them) No injury to  lips/tongue.  Teeth feel like they are in the right place, able to open/close jaw easily No step off palpated, symmetric zygomas and orbital ridges to palpation Hematoma/abrasion under/lateral to left eye 2.5 x 4"  Eyes: EOM are normal. Pupils are equal, round, and reactive to light.  Conjugate gaze, no eye redness/drainage  Neck: Neck supple.  No tenderness to palpation  Cardiovascular: Normal rate.   Pulmonary/Chest: No respiratory distress.  Abdominal: He exhibits no distension.  Musculoskeletal: Normal range of motion.  Neurological: He is alert and oriented to person, place, and time.  Skin: Skin is warm and dry.  No cyanosis 4 x 9" abrasion/skin tear dorsal left forearm.  LET applied.  Cleaned with very very dilute hibiclens/saline and debrided of grit. Rinsed with saline.  Vaseline gauze dressing applied.    Nursing note and vitals reviewed.    UC Treatments / Results   Procedures Procedures (including critical care time)      See physical exam, LET applied and wound on forearm cleaned, debrided, dressed.  Final Clinical Impressions(s) / UC Diagnoses   Final diagnoses:  Facial contusion, initial encounter  Skin tear of left forearm without complication, initial encounter  Fall, initial encounter   Ok to resume taking eliquis.  Left forearm wound was debrided today at the urgent care.  Change dressing to left forearm and left cheek every day or every other day.  Wash gently with soap/water and rebandage, using non-adherent materials like telfa or vaseline gauze.  Some wound dressings appropriate for skin tear can be left on for several days between dressing changes, and may be suitable.  Recheck or follow up with Dr Beryle Beams for any increasing redness/pain/drainage/swelling or new fever >100.5.  Tdap was given at the urgent care today.   Sherlene Shams, MD 08/17/16 [phone]

## 2016-08-13 NOTE — Discharge Instructions (Addendum)
Ok to resume taking eliquis.  Left forearm wound was debrided today at the urgent care.  Change dressing to left forearm and left cheek every day or every other day.  Wash gently with soap/water and rebandage, using non-adherent materials like telfa or vaseline gauze.  Some wound dressings appropriate for skin tear can be left on for several days between dressing changes, and may be suitable.  Recheck or follow up with Dr Beryle Beams for any increasing redness/pain/drainage/swelling or new fever >100.5.  Tdap was given at the urgent care today.

## 2016-08-13 NOTE — ED Triage Notes (Signed)
Patient fell and rolled down a hill yesterday Patient scrapped her arm and facial eye area States is in pain 1 out of 10 Does need update tetanus shot

## 2016-08-30 ENCOUNTER — Other Ambulatory Visit: Payer: Self-pay | Admitting: Oncology

## 2016-08-30 DIAGNOSIS — N289 Disorder of kidney and ureter, unspecified: Secondary | ICD-10-CM

## 2016-08-30 DIAGNOSIS — D539 Nutritional anemia, unspecified: Secondary | ICD-10-CM

## 2016-08-30 DIAGNOSIS — D472 Monoclonal gammopathy: Secondary | ICD-10-CM

## 2016-08-30 DIAGNOSIS — Z7901 Long term (current) use of anticoagulants: Secondary | ICD-10-CM

## 2016-09-02 ENCOUNTER — Other Ambulatory Visit (INDEPENDENT_AMBULATORY_CARE_PROVIDER_SITE_OTHER): Payer: Medicare Other

## 2016-09-02 DIAGNOSIS — D539 Nutritional anemia, unspecified: Secondary | ICD-10-CM

## 2016-09-02 DIAGNOSIS — Z7901 Long term (current) use of anticoagulants: Secondary | ICD-10-CM

## 2016-09-02 DIAGNOSIS — Z711 Person with feared health complaint in whom no diagnosis is made: Secondary | ICD-10-CM

## 2016-09-02 DIAGNOSIS — N289 Disorder of kidney and ureter, unspecified: Secondary | ICD-10-CM

## 2016-09-02 DIAGNOSIS — D472 Monoclonal gammopathy: Secondary | ICD-10-CM | POA: Diagnosis not present

## 2016-09-03 ENCOUNTER — Other Ambulatory Visit: Payer: Self-pay | Admitting: Oncology

## 2016-09-06 ENCOUNTER — Other Ambulatory Visit: Payer: Medicare Other

## 2016-09-06 LAB — CBC WITH DIFFERENTIAL/PLATELET
BASOS ABS: 0 10*3/uL (ref 0.0–0.2)
Basos: 0 %
EOS (ABSOLUTE): 0.3 10*3/uL (ref 0.0–0.4)
Eos: 4 %
Hematocrit: 33.7 % — ABNORMAL LOW (ref 37.5–51.0)
Hemoglobin: 11 g/dL — ABNORMAL LOW (ref 12.6–17.7)
Immature Grans (Abs): 0 10*3/uL (ref 0.0–0.1)
Immature Granulocytes: 0 %
LYMPHS ABS: 2.1 10*3/uL (ref 0.7–3.1)
Lymphs: 29 %
MCH: 33.2 pg — AB (ref 26.6–33.0)
MCHC: 32.6 g/dL (ref 31.5–35.7)
MCV: 102 fL — ABNORMAL HIGH (ref 79–97)
Monocytes Absolute: 0.7 10*3/uL (ref 0.1–0.9)
Monocytes: 10 %
NEUTROS ABS: 4 10*3/uL (ref 1.4–7.0)
Neutrophils: 57 %
PLATELETS: 285 10*3/uL (ref 150–379)
RBC: 3.31 x10E6/uL — ABNORMAL LOW (ref 4.14–5.80)
RDW: 14.3 % (ref 12.3–15.4)
WBC: 7.1 10*3/uL (ref 3.4–10.8)

## 2016-09-06 LAB — CMP14 + ANION GAP
A/G RATIO: 1 — AB (ref 1.2–2.2)
ALK PHOS: 46 IU/L (ref 39–117)
ALT: 26 IU/L (ref 0–44)
ANION GAP: 12 mmol/L (ref 10.0–18.0)
AST: 24 IU/L (ref 0–40)
Albumin: 3.7 g/dL (ref 3.5–4.7)
BUN/Creatinine Ratio: 10 (ref 10–24)
BUN: 17 mg/dL (ref 8–27)
Bilirubin Total: 0.5 mg/dL (ref 0.0–1.2)
CO2: 24 mmol/L (ref 18–29)
Calcium: 9.1 mg/dL (ref 8.6–10.2)
Chloride: 102 mmol/L (ref 96–106)
Creatinine, Ser: 1.7 mg/dL — ABNORMAL HIGH (ref 0.76–1.27)
GFR calc Af Amer: 40 mL/min/{1.73_m2} — ABNORMAL LOW (ref >59)
GFR calc non Af Amer: 35 mL/min/{1.73_m2} — ABNORMAL LOW (ref >59)
GLOBULIN, TOTAL: 3.7 g/dL (ref 1.5–4.5)
GLUCOSE: 89 mg/dL (ref 65–99)
POTASSIUM: 4.8 mmol/L (ref 3.5–5.2)
SODIUM: 138 mmol/L (ref 134–144)
Total Protein: 7.4 g/dL (ref 6.0–8.5)

## 2016-09-06 LAB — IMMUNOFIXATION ELECTROPHORESIS
IgA/Immunoglobulin A, Serum: 88 mg/dL (ref 61–437)
IgG (Immunoglobin G), Serum: 2723 mg/dL — ABNORMAL HIGH (ref 700–1600)
IgM (Immunoglobulin M), Srm: 75 mg/dL (ref 15–143)

## 2016-09-06 LAB — KAPPA/LAMBDA LIGHT CHAINS
IG KAPPA FREE LIGHT CHAIN: 79.2 mg/L — AB (ref 3.3–19.4)
Ig Lambda Free Light Chain: 13.1 mg/L (ref 5.7–26.3)
Kappa/Lambda FluidC Ratio: 6.05 — ABNORMAL HIGH (ref 0.26–1.65)

## 2016-09-06 LAB — T4, FREE: FREE T4: 1.45 ng/dL (ref 0.82–1.77)

## 2016-09-06 LAB — TSH: TSH: 1.8 u[IU]/mL (ref 0.450–4.500)

## 2016-09-13 ENCOUNTER — Encounter: Payer: Self-pay | Admitting: Oncology

## 2016-09-13 ENCOUNTER — Ambulatory Visit (INDEPENDENT_AMBULATORY_CARE_PROVIDER_SITE_OTHER): Payer: Medicare Other | Admitting: Oncology

## 2016-09-13 VITALS — BP 133/64 | HR 58 | Temp 98.2°F | Ht 68.0 in | Wt 190.7 lb

## 2016-09-13 DIAGNOSIS — I482 Chronic atrial fibrillation: Secondary | ICD-10-CM

## 2016-09-13 DIAGNOSIS — Z888 Allergy status to other drugs, medicaments and biological substances status: Secondary | ICD-10-CM

## 2016-09-13 DIAGNOSIS — N289 Disorder of kidney and ureter, unspecified: Secondary | ICD-10-CM

## 2016-09-13 DIAGNOSIS — Z881 Allergy status to other antibiotic agents status: Secondary | ICD-10-CM

## 2016-09-13 DIAGNOSIS — Z7982 Long term (current) use of aspirin: Secondary | ICD-10-CM

## 2016-09-13 DIAGNOSIS — Z79899 Other long term (current) drug therapy: Secondary | ICD-10-CM

## 2016-09-13 DIAGNOSIS — Z87891 Personal history of nicotine dependence: Secondary | ICD-10-CM

## 2016-09-13 DIAGNOSIS — G252 Other specified forms of tremor: Secondary | ICD-10-CM | POA: Diagnosis not present

## 2016-09-13 DIAGNOSIS — Z885 Allergy status to narcotic agent status: Secondary | ICD-10-CM

## 2016-09-13 DIAGNOSIS — Z8546 Personal history of malignant neoplasm of prostate: Secondary | ICD-10-CM | POA: Diagnosis not present

## 2016-09-13 DIAGNOSIS — Z7901 Long term (current) use of anticoagulants: Secondary | ICD-10-CM

## 2016-09-13 DIAGNOSIS — Z923 Personal history of irradiation: Secondary | ICD-10-CM

## 2016-09-13 DIAGNOSIS — Z880 Allergy status to penicillin: Secondary | ICD-10-CM

## 2016-09-13 DIAGNOSIS — D472 Monoclonal gammopathy: Secondary | ICD-10-CM

## 2016-09-13 NOTE — Patient Instructions (Addendum)
Pick up urine container for 24 hour collection Return Wednesday 11/2 with urine collection and to get labwork I will call with results If all stable, then repeat lab 01/24/17 with MD visit 1-2 weeks after lab

## 2016-09-13 NOTE — Progress Notes (Signed)
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 09/13/2016 6:19 PM   Principle Diagnosis: Encounter Diagnoses  Name Primary?  . Monoclonal gammopathy   . Renal insufficiency   . 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  He was diagnosed with IgG kappa monoclonal gammopathy of undetermined significance in July 2000 and localized prostate cancer treated with radiofrequency ablation also diagnosed in 2000.  Both of these disorders have been remarkably stable over time. I am monitoring lab work on an every six-month basis. There have been little changes.  He has developed chronic renal insufficiency over time making some of the lab tests difficult to interpret with respect to progression to myeloma.   He has chronic atrial fibrillation. Initial onset in 2011. He has had 6 cardioversion procedures most recent on 12/12/2015. He is currently back in normal sinus rhythm as of most recent cardiogram done 04/16/2015. Arrhythmias now well-controlled on amiodarone. Beta blockers discontinued due to intolerance. (Primarily fatigue and unsteady gait)  He ws on chronic anticoagulation with Pradaxa. In view of progressive decline in renal function over time, I recently changed him to apixiban 2.5 mg twice daily. He has asymptomatic valvular heart disease with aortic stenosis and mitral regurgitation murmurs heard on exam. Echocardiogram done 11/12/2015 with ejection fraction 60-65 percent. However, there was grade 3 diastolic dysfunction. Mild aortic valve stenosis. Mild aortic regurgitation. Calcified mitral annulus with mild regurgitation. Severe dilatation of the left atrium, moderate dilatation of the right atrium, increased pulmonary artery pressure 44 mm.  He has had 2 major falls, first occurred in  July 2015 when he fell in his backyard while gardening. He sustained multiple lacerations of his face. Bilateral LeFort I nasal and maxillary sinus fractures. Fracture of the bones of his left hand, Left chest wall contusion. Left fifth finger laceration. He ultimately required corrective surgery on his facial bones by Dr. Constance Holster and open reduction internal fixation of the left hand fracture by Dr. Amedeo Plenty both in August 2015. He fell again at home in February 2016 and fractured his right clavicle. He has been under the care of Dr. Noemi Chapel orthopedic surgery.  He was referred to neurology seen in August 2016 by Dr. Erlinda Hong for further evaluation of progressive tremor and loss of fine coordination as well as gait disturbance. He also has sensorineural hearing deficit. Neurologist concerned that he may have early Parkinson's disease.  A trial of low-dose primidone prescribed for his essential tremor given patient's intolerance of beta blockers. The patient reports no improvement.  He obtained a second neurology opinion from Dr. Wells Guiles Tat. She did not find any current signs of parkinsonism. She also recommended the primidone for essential tremor but after consultation with his cardiologist decided against it in view of potential interactions with his anticoagulant. I subsequently put him on a brief trial of alprazolam which did not seem to help. We mutually decided at time of his visit here in July to give the primidone a try. He did not tolerate the 50 mg daily dose but has tolerated 25 mg and has seen some improvement in his ability to write.  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 He is working with physical therapy which is helping. He has been on a trial of primidone currently just 25 mg at night which has  helped somewhat with his intention tremor. He has fallen a number of times. He had significant excoriation of the skin of his left forearm. No chest pain  or palpitations. He continues on amiodarone 200 mg daily. Apixiban 2.5 mg twice daily. And aspirin 81 mg daily. Renal function continues to fluctuate with low creatinine values of 1.5 and most recent value over the last 4 months of 1.7. This may explain the fall in his hemoglobin from his baseline of 12 g down to current value of 11 g. IgG level also fluctuating with current value 2723 mg percent on October 19. Previous value 2623 in June. This volume is within range of a previous 5 use although he has had values as low as 2193 mg percent as recently as 11/13/2015. A serum free light chains stable compared with his June 2017 value at 79 mg percent with kappa/lambda ratio stable at 6.05.  Since December 2016, miinor trend for increasing kappa levels which were 63 mg percent then. However, there has been ignificant rise in the kappa free light chains compare with values done in November 2014 of 2.94, and in September 2015, 3.8. In March 2016,  6.7.  PSA remains stable most recent value 3.0 on 05/10/2016.   Medications: reviewed  Allergies:  Allergies  Allergen Reactions  . Chlorhexidine Rash  . Guaifenesin & Derivatives Itching  . Keflex [Cephalexin] Diarrhea  . Oxycodone Itching and Rash  . Oxycontin [Oxycodone Hcl] Itching and Rash  . Penicillins Other (See Comments)    Caused fungal reaction Has patient had a PCN reaction causing immediate rash, facial/tongue/throat swelling, SOB or lightheadedness with hypotension: No Has patient had a PCN reaction causing severe rash involving mucus membranes or skin necrosis: No Has patient had a PCN reaction that required hospitalization No Has patient had a PCN reaction occurring within the last 10 years: No If all of the above answers are "NO", then may proceed with Cephalosporin use.     Review of Systems: See interim history Remaining ROS negative:   Physical Exam: Blood pressure 133/64, pulse (!) 58, temperature 98.2 F (36.8 C),  temperature source Oral, height 5\' 8"  (1.727 m), weight 190 lb 11.2 oz (86.5 kg), SpO2 98 %. Wt Readings from Last 3 Encounters:  09/13/16 190 lb 11.2 oz (86.5 kg)  06/25/16 192 lb (87.1 kg)  06/01/16 195 lb 1.6 oz (88.5 kg)     General appearance: Well-nourished Caucasian man. Having difficulty ambulating. HENNT: Pharynx no erythema, exudate, mass, or ulcer. No thyromegaly or thyroid nodules Lymph nodes: No cervical, supraclavicular, or right axillary lymphadenopathy. Chronic, 2-3 centimeter left axillary lymph node palpable unchanged. Breasts: Lungs: Clear to auscultation, resonant to percussion throughout Heart: Regular rhythm, 2/6 mitral regurgitant murmur, 3/6 aortic stenosis murmur, no gallop, no rub, no click, no edema Abdomen: Soft, nontender, normal bowel sounds, no mass, no organomegaly Extremities: No edema, no calf tenderness Musculoskeletal: no joint deformities GU:  Vascular: Carotid pulses 2+, no bruits,  Neurologic: Alert, oriented, PERRLA, optic discs sharp and vessels normal, no hemorrhage or exudate, cranial nerves grossly normal, motor strength 5 over 5, reflexes 1+ symmetric, upper body coordination normal, mild intention tremor. Skin: Extensive excoriation skin of his left arm. It was bandaged. He showed me pictures. No rash or ecchymosis  Lab Results: CBC W/Diff    Component Value Date/Time   WBC 7.1 09/02/2016 1100   WBC 9.0 12/09/2015 1215   RBC 3.31 (L) 09/02/2016 1100   RBC 3.81 (L) 12/09/2015 1215  HGB 12.8 (L) 12/09/2015 1215   HGB 12.1 (L) 01/14/2014 0804   HCT 33.7 (L) 09/02/2016 1100   HCT 36.5 (L) 01/14/2014 0804   PLT 285 09/02/2016 1100   MCV 102 (H) 09/02/2016 1100   MCV 99.8 (H) 01/14/2014 0804   MCH 33.2 (H) 09/02/2016 1100   MCH 33.6 12/09/2015 1215   MCHC 32.6 09/02/2016 1100   MCHC 33.1 12/09/2015 1215   RDW 14.3 09/02/2016 1100   RDW 13.7 01/14/2014 0804   LYMPHSABS 2.1 09/02/2016 1100   LYMPHSABS 2.6 01/14/2014 0804   MONOABS 0.8  05/14/2015 0955   MONOABS 0.7 01/14/2014 0804   EOSABS 0.3 09/02/2016 1100   BASOSABS 0.0 09/02/2016 1100   BASOSABS 0.0 01/14/2014 0804     Chemistry      Component Value Date/Time   NA 138 09/02/2016 1100   NA 139 01/14/2014 0804   K 4.8 09/02/2016 1100   K 4.8 01/14/2014 0804   CL 102 09/02/2016 1100   CL 107 03/19/2013 0949   CO2 24 09/02/2016 1100   CO2 25 01/14/2014 0804   BUN 17 09/02/2016 1100   BUN 16.7 01/14/2014 0804   CREATININE 1.70 (H) 09/02/2016 1100   CREATININE 1.63 (H) 12/09/2015 1215   CREATININE 1.7 (H) 01/14/2014 0804      Component Value Date/Time   CALCIUM 9.1 09/02/2016 1100   CALCIUM 9.4 01/14/2014 0804   ALKPHOS 46 09/02/2016 1100   ALKPHOS 45 01/14/2014 0804   AST 24 09/02/2016 1100   AST 21 01/14/2014 0804   ALT 26 09/02/2016 1100   ALT 20 01/14/2014 0804   BILITOT 0.5 09/02/2016 1100   BILITOT 0.71 01/14/2014 0804       Radiological Studies: No results found.  Impression:  #1. IgG kappa monoclonal gammopathy of undetermined significance Diagnosed 17 years ago. There is a trend for slowly rising kappa free light chains and light chain ratio with stable renal function but total IgG immunoglobulin, hemoglobin, and platelets remain within range of previous values except for a minor fall in his hemoglobin which may correlate with the fluctuation in his creatinine with current value of 1.7. We have not checked a 24-hour urine for protein in the last 3 years and I would like him to submit a sample at this time.  #2. Resting tremor Some improvement with low-dose primidone and physical therapy. He will try to increase back up to the 50 mg daily strength.  #3. Chronic atrial fibrillation Currently controlled on amiodarone  #4. Early stage prostate cancer treated with radiofrequency ablation procedure 17 years ago. PSA remains stable with most recent value of 3.0 done 05/10/2016. He continues to follow-up with urology Dr. Gaynelle Arabian.  #5.  Asymptomatic valvular heart disease  #6. Chronic, stable, left axillary lymphadenopathy  #7. Excoriation of skin left forearm after a fall. Pictures he took over the last few days showed the skin is now healing. He was treating an exudate with topical antibiotic cream and this appears to have helped.    CC: Patient Care Team: Annia Belt, MD as PCP - General (Oncology) Ronald Lobo, MD (Gastroenterology) Carolan Clines, MD (Urology) Lelon Perla, MD as Consulting Physician (Cardiology)   Annia Belt, MD 10/30/20176:19 PM

## 2016-09-16 ENCOUNTER — Other Ambulatory Visit: Payer: Medicare Other

## 2016-11-02 ENCOUNTER — Telehealth: Payer: Self-pay | Admitting: Cardiology

## 2016-11-02 NOTE — Telephone Encounter (Signed)
He is a pt of Dr Wonda Cerise found out Dr Stanford Breed was not here.He insisted that he wanted to talk to the doctor of the day. 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 He would like for Dr Martinique to call him if possible please before 12:30.

## 2016-11-02 NOTE — Telephone Encounter (Signed)
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 He wanted to relate an episode he had on Sunday 2 days ago. He was taking a shower and became very weak. Had to sit down to towel off then slid to floor. Did not pass out. Felt very weak but no dizziness, chest pain, SOB. Took pulse and it felt regular in 50s. Did not check BP. Sat in a recliner for a while and symptoms resolved. Feels fine now. Was concerned that he might have Afib but in the past Afib was irregular and rate was in 90s. I suspect this was more vagally mediated. Instructed patient to take his BP if it should happen again and let us know if he has more episodes.   Kerria Sapien Martinique MD, [facility]

## 2016-11-19 ENCOUNTER — Other Ambulatory Visit: Payer: Self-pay | Admitting: Oncology

## 2016-12-06 ENCOUNTER — Other Ambulatory Visit: Payer: Self-pay | Admitting: Cardiology

## 2016-12-22 ENCOUNTER — Other Ambulatory Visit: Payer: Self-pay | Admitting: Oncology

## 2016-12-22 ENCOUNTER — Ambulatory Visit (INDEPENDENT_AMBULATORY_CARE_PROVIDER_SITE_OTHER): Payer: Medicare Other | Admitting: Cardiology

## 2016-12-22 ENCOUNTER — Encounter: Payer: Self-pay | Admitting: Cardiology

## 2016-12-22 VITALS — BP 152/98 | HR 86 | Ht 67.0 in | Wt 190.0 lb

## 2016-12-22 DIAGNOSIS — I350 Nonrheumatic aortic (valve) stenosis: Secondary | ICD-10-CM

## 2016-12-22 DIAGNOSIS — I2510 Atherosclerotic heart disease of native coronary artery without angina pectoris: Secondary | ICD-10-CM | POA: Diagnosis not present

## 2016-12-22 DIAGNOSIS — I480 Paroxysmal atrial fibrillation: Secondary | ICD-10-CM | POA: Diagnosis not present

## 2016-12-22 NOTE — Progress Notes (Signed)
HPI: Follow-up paroxysmal atrial fibrillation. Had cardiac catheterization in August 2011 that showed nonobstructive coronary disease. Abdominal ultrasound July 2014 showed no aneurysm. Last echocardiogram December 2016 showed normal LV systolic function, grade 3 diastolic dysfunction, mild aortic stenosis (mean gradient 15 mmHg), mild aortic insufficiency, mild mitral regurgitation, severe left atrial enlargement, moderate right atrial enlargement and moderately elevated pulmonary pressures. Developed recurrent atrial fibrillation and required repeat cardioversion on 12/12/2015. Since last seen, patient has had several weak and dizzy spells. The first occurred in December while he was in the shower. He developed weakness and dizziness. No associated chest pain, palpitations, nausea or diaphoresis. He stepped out of the shower and sat on the commode and then slid into the floor. He does not think he lost consciousness. The episode lasted several minutes and resolved. He had a second episode more recently during physical therapy. He became weak. He apparently had a blood pressure of 123/70 and a pulse of 80. Finally he had a third episode yesterday and felt like he may be in atrial fibrillation. He otherwise does not have dyspnea, exertional chest pain or syncope.  Current Outpatient Prescriptions  Medication Sig Dispense Refill  . amiodarone (PACERONE) 200 MG tablet TAKE 1 TABLET BY MOUTH DAILY 75 tablet 0  . apixaban (ELIQUIS) 2.5 MG TABS tablet Take 1 tablet (2.5 mg total) by mouth 2 (two) times daily. 60 tablet 11  . aspirin EC 81 MG tablet Take 81 mg by mouth every Monday, Wednesday, and Friday.    Marland Kitchen atorvastatin (LIPITOR) 10 MG tablet Take 1 tablet (10 mg total) by mouth daily at 6 PM. 30 tablet 1  . PRIMIDONE PO Take 100 mg by mouth daily.     No current facility-administered medications for this visit.      Past Medical History:  Diagnosis Date  . Anemia   . Aortic insufficiency    Mild, echo, July, 2011  . Aortic stenosis    Mild, echo, July, 2011  . Atrial fibrillation [facility]) 2011, 2015   Multaq Started September, 2011, dose adjusted October, 2011, 200 mg a.m., 400 mg p.o.  . Bradycardia    Sinus bradycardia while on 25 Lopressor twice a day October, 2013  . Carotid artery disease ([place])    Doppler, 2008 no significant abnormality  . Cervical spine disease   . Chronic anticoagulation 01/27/2015   pradaxa 75 mg BID for A fib  . Coronary artery disease    minimal, cath 2005 / catheterization August, 2011, 30% in 3 vessels, normal LV function  . Diarrhea    Chronic  . Drug therapy    Pradaxa Started July, 2011  . Ejection fraction    EF 60%, echo, 2009  /  TEE normal August, 2011 /   EF 60%, echo, July, 2011  . GERD (gastroesophageal reflux disease)    Esophageal dilatation in 1992, some esophageal spasm  . Hyperlipidemia   . Incomplete right bundle branch block    August, 2012  . Leg fatigue    June, 2014  . Memory change    June, 2014  . Monoclonal gammopathy    granfortuna  . MR (mitral regurgitation) 2009   mild, Echo, July, 2011  . Painless hematuria 03/27/2012   Occurred x 2 5/12 & 5/13 on Pradaxa 75 mg BID  . Prostate cancer ([place]) 11/22/2011  . Renal insufficiency    Prior creatinine 1.2 /  September, 2011.. 1.7  /  October, 2011.. 1.8  . S/P  transurethral resection of prostate 2004   2004  . TIA (transient ischemic attack)    Dr. Erling Cruz,, question in the past. when off ASA for 10 days    Past Surgical History:  Procedure Laterality Date  . CARDIAC CATHETERIZATION    . CARDIOVERSION  08/01/2012   Procedure: CARDIOVERSION;  Surgeon: Carlena Bjornstad, MD;  Location: [facility] ENDOSCOPY;  Service: Cardiovascular;  Laterality: N/A;  . CARDIOVERSION N/A 11/29/2013   Procedure: CARDIOVERSION;  Surgeon: Carlena Bjornstad, MD;  Location: [facility] ENDOSCOPY;  Service: Cardiovascular;  Laterality: N/A;  . CARDIOVERSION N/A 11/27/2014   Procedure: CARDIOVERSION;  Surgeon:  Carlena Bjornstad, MD;  Location: [facility] ENDOSCOPY;  Service: Cardiovascular;  Laterality: N/A;  . CARDIOVERSION N/A 03/12/2015   Procedure: CARDIOVERSION;  Surgeon: Carlena Bjornstad, MD;  Location: [place];  Service: Cardiovascular;  Laterality: N/A;  . CARDIOVERSION N/A 12/12/2015   Procedure: CARDIOVERSION;  Surgeon: Fay Records, MD;  Location: [place];  Service: Cardiovascular;  Laterality: N/A;  . CATARACT EXTRACTION    . COLONOSCOPY    . EYE SURGERY Bilateral    cataract extraction  . Fibular fracture repair    . HERNIA REPAIR    . History of TURP    . OPEN REDUCTION INTERNAL FIXATION (ORIF) METACARPAL Left 06/22/2014   Procedure: OPEN REDUCTION INTERNAL FIXATION (ORIF) LEFT HAND METACARPAL;  Surgeon: Roseanne Kaufman, MD;  Location: [place];  Service: Orthopedics;  Laterality: Left;  . ORIF ORBITAL FRACTURE Bilateral 06/22/2014   Procedure: OPEN REDUCTION INTERNAL FIXATION (ORIF) BILATERAL LEFORTE1 FRACTURE OF MAXILLA, HYBRID ARCH BARS ;  Surgeon: Izora Gala, MD;  Location: [place];  Service: ENT;  Laterality: Bilateral;  . SHOULDER ARTHROSCOPY WITH ROTATOR CUFF REPAIR Right 01/23/2014   DR Noemi Chapel   . SHOULDER ARTHROSCOPY WITH ROTATOR CUFF REPAIR Right 01/23/2014   Procedure: RIGHT SHOULDER ARTHROSCOPY WITH DEBRIDEMENT AND ROTATOR CUFF REPAIR;  Surgeon: Lorn Junes, MD;  Location: [place];  Service: Orthopedics;  Laterality: Right;    Social History   Social History  . Marital status: Widowed    Spouse name: N/A  . Number of children: N/A  . 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 Smoking status: Former Research scientist (life sciences)  . Smokeless tobacco: Never Used  . Alcohol use 1.2 oz/week    1 Glasses of wine, 1 Shots of liquor per week     Comment: Daily  . Drug use: No  . Sexual activity: Not on file   Other Topics Concern  . Not on file   Social History Narrative  . No narrative on file    History reviewed. No pertinent  family history.  ROS: occasional weakness but no fevers or chills, productive cough, hemoptysis, dysphasia, odynophagia, melena, hematochezia, dysuria, hematuria, rash, seizure activity, orthopnea, PND, pedal edema, claudication. Remaining systems are negative.  Physical Exam: Well-developed well-nourished in no acute distress.  Skin is warm and dry.  HEENT is normal.  Neck is supple.  Chest is clear to auscultation with normal expansion.  Cardiovascular exam is regular rate and rhythm. 2/6 systolic murmur Abdominal exam nontender or distended. No masses palpated. Extremities show no edema. neuro grossly intact; unsteady ambulation  ECG-Sinus rhythm, left anterior fascicular block, right bundle branch block.  A/P  1 Near syncope-etiology unclear. Event in shower question bradycardia mediated. Does not sound vagal. Other episodes question recurrent atrial fibrillation. Patient does not wish to wear  a monitor. I have asked him to purchase alivecor to see if we can correlate recurrent symptoms with arrhythmia. Note he has not had frank syncope. Repeat echo.  2 PAF-Patient in sinus rhythm today. Continue amiodarone. Check TSH, liver functions and chest x-ray in April. Continue apixaban. Check Hgb and renal function in April.  3 aortic stenosis-mild on most recent echocardiogram. Repeat echo.  4 hyperlipidemia-continue statin.  5 coronary artery disease-continue statin.   Kirk Ruths, MD

## 2016-12-22 NOTE — Patient Instructions (Signed)
Medication Instructions:   NO CHANGE  Testing/Procedures:  Your physician has requested that you have an echocardiogram. Echocardiography is a painless test that uses sound waves to create images of your heart. It provides your doctor with information about the size and shape of your heart and how well your heart's chambers and valves are working. This procedure takes approximately one hour. There are no restrictions for this procedure.    Follow-Up:  Your physician recommends that you schedule a follow-up appointment in: [name]

## 2016-12-24 ENCOUNTER — Telehealth: Payer: Self-pay | Admitting: *Deleted

## 2016-12-24 MED ORDER — AMIODARONE HCL 200 MG PO TABS
ORAL_TABLET | ORAL | 0 refills | Status: DC
Start: 2016-12-24 — End: 2017-02-03

## 2016-12-24 NOTE — Telephone Encounter (Signed)
Patient emailed dr Stanford Breed this morning that he was back in atrial fib. Spoke with pt, he reports he feels fine with no chest pain or SOB. He has increased fatigue. Per dr Stanford Breed, patient instructed to increase amiodarone to 400 mg once daily x one month then return to 200 mg once daily. Pt agreed with this plan. He will call when refill needed.

## 2017-01-05 ENCOUNTER — Ambulatory Visit (HOSPITAL_COMMUNITY): Payer: Medicare Other | Attending: Internal Medicine

## 2017-01-05 ENCOUNTER — Ambulatory Visit: Payer: Medicare Other | Admitting: Cardiology

## 2017-01-05 DIAGNOSIS — I350 Nonrheumatic aortic (valve) stenosis: Secondary | ICD-10-CM

## 2017-01-05 DIAGNOSIS — I700 Atherosclerosis of aorta: Secondary | ICD-10-CM | POA: Diagnosis not present

## 2017-01-05 DIAGNOSIS — I080 Rheumatic disorders of both mitral and aortic valves: Secondary | ICD-10-CM | POA: Diagnosis not present

## 2017-01-21 ENCOUNTER — Telehealth: Payer: Self-pay | Admitting: Oncology

## 2017-01-21 NOTE — Telephone Encounter (Signed)
APT. REMINDER CALL, NO ANSWER, NO VOICEMAIL °

## 2017-01-24 ENCOUNTER — Other Ambulatory Visit: Payer: Medicare Other

## 2017-01-26 ENCOUNTER — Other Ambulatory Visit: Payer: Self-pay | Admitting: Dermatology

## 2017-01-30 ENCOUNTER — Other Ambulatory Visit: Payer: Self-pay | Admitting: Cardiology

## 2017-01-31 ENCOUNTER — Other Ambulatory Visit (INDEPENDENT_AMBULATORY_CARE_PROVIDER_SITE_OTHER): Payer: Medicare Other

## 2017-01-31 DIAGNOSIS — E785 Hyperlipidemia, unspecified: Secondary | ICD-10-CM

## 2017-01-31 DIAGNOSIS — D472 Monoclonal gammopathy: Secondary | ICD-10-CM | POA: Diagnosis not present

## 2017-01-31 DIAGNOSIS — N289 Disorder of kidney and ureter, unspecified: Secondary | ICD-10-CM

## 2017-01-31 NOTE — Addendum Note (Signed)
Addended by: Orson Gear on: 01/31/2017 11:30 AM   Modules accepted: Orders

## 2017-02-01 LAB — IGG, IGA, IGM
IGA/IMMUNOGLOBULIN A, SERUM: 78 mg/dL (ref 61–437)
IGM (IMMUNOGLOBULIN M), SRM: 71 mg/dL (ref 15–143)
IgG (Immunoglobin G), Serum: 2928 mg/dL — ABNORMAL HIGH (ref 700–1600)

## 2017-02-01 LAB — CBC WITH DIFFERENTIAL/PLATELET
BASOS: 0 %
Basophils Absolute: 0 10*3/uL (ref 0.0–0.2)
EOS (ABSOLUTE): 0.5 10*3/uL — ABNORMAL HIGH (ref 0.0–0.4)
Eos: 6 %
HEMATOCRIT: 34.6 % — AB (ref 37.5–51.0)
Hemoglobin: 11.4 g/dL — ABNORMAL LOW (ref 13.0–17.7)
Immature Grans (Abs): 0 10*3/uL (ref 0.0–0.1)
Immature Granulocytes: 0 %
LYMPHS ABS: 2.6 10*3/uL (ref 0.7–3.1)
Lymphs: 32 %
MCH: 33.2 pg — ABNORMAL HIGH (ref 26.6–33.0)
MCHC: 32.9 g/dL (ref 31.5–35.7)
MCV: 101 fL — AB (ref 79–97)
MONOS ABS: 0.7 10*3/uL (ref 0.1–0.9)
Monocytes: 9 %
NEUTROS PCT: 53 %
Neutrophils Absolute: 4.2 10*3/uL (ref 1.4–7.0)
PLATELETS: 287 10*3/uL (ref 150–379)
RBC: 3.43 x10E6/uL — AB (ref 4.14–5.80)
RDW: 14.4 % (ref 12.3–15.4)
WBC: 8 10*3/uL (ref 3.4–10.8)

## 2017-02-01 LAB — COMPREHENSIVE METABOLIC PANEL
ALK PHOS: 53 IU/L (ref 39–117)
ALT: 26 IU/L (ref 0–44)
AST: 25 IU/L (ref 0–40)
Albumin/Globulin Ratio: 0.9 — ABNORMAL LOW (ref 1.2–2.2)
Albumin: 3.7 g/dL (ref 3.5–4.7)
BUN/Creatinine Ratio: 11 (ref 10–24)
BUN: 18 mg/dL (ref 8–27)
Bilirubin Total: 0.4 mg/dL (ref 0.0–1.2)
CHLORIDE: 101 mmol/L (ref 96–106)
CO2: 22 mmol/L (ref 18–29)
Calcium: 8.8 mg/dL (ref 8.6–10.2)
Creatinine, Ser: 1.61 mg/dL — ABNORMAL HIGH (ref 0.76–1.27)
GFR calc Af Amer: 43 mL/min/{1.73_m2} — ABNORMAL LOW (ref >59)
GFR calc non Af Amer: 37 mL/min/{1.73_m2} — ABNORMAL LOW (ref >59)
GLOBULIN, TOTAL: 3.9 g/dL (ref 1.5–4.5)
Glucose: 78 mg/dL (ref 65–99)
POTASSIUM: 4.7 mmol/L (ref 3.5–5.2)
SODIUM: 139 mmol/L (ref 134–144)
Total Protein: 7.6 g/dL (ref 6.0–8.5)

## 2017-02-01 LAB — KAPPA/LAMBDA LIGHT CHAINS
Ig Kappa Free Light Chain: 72.2 mg/L — ABNORMAL HIGH (ref 3.3–19.4)
Ig Lambda Free Light Chain: 11.4 mg/L (ref 5.7–26.3)
KAPPA/LAMBDA FLC RATIO: 6.33 — AB (ref 0.26–1.65)

## 2017-02-01 LAB — LIPID PANEL
Chol/HDL Ratio: 3 ratio units (ref 0.0–5.0)
Cholesterol, Total: 152 mg/dL (ref 100–199)
HDL: 50 mg/dL (ref >39)
LDL CALC: 90 mg/dL (ref 0–99)
Triglycerides: 59 mg/dL (ref 0–149)
VLDL CHOLESTEROL CAL: 12 mg/dL (ref 5–40)

## 2017-02-03 ENCOUNTER — Ambulatory Visit (INDEPENDENT_AMBULATORY_CARE_PROVIDER_SITE_OTHER): Payer: Medicare Other | Admitting: Cardiology

## 2017-02-03 ENCOUNTER — Encounter: Payer: Self-pay | Admitting: Cardiology

## 2017-02-03 VITALS — BP 158/102 | HR 118 | Ht 67.0 in | Wt 193.0 lb

## 2017-02-03 DIAGNOSIS — Z79899 Other long term (current) drug therapy: Secondary | ICD-10-CM

## 2017-02-03 DIAGNOSIS — I4510 Unspecified right bundle-branch block: Secondary | ICD-10-CM | POA: Diagnosis not present

## 2017-02-03 DIAGNOSIS — I480 Paroxysmal atrial fibrillation: Secondary | ICD-10-CM

## 2017-02-03 DIAGNOSIS — I2510 Atherosclerotic heart disease of native coronary artery without angina pectoris: Secondary | ICD-10-CM | POA: Diagnosis not present

## 2017-02-03 DIAGNOSIS — I4892 Unspecified atrial flutter: Secondary | ICD-10-CM | POA: Insufficient documentation

## 2017-02-03 DIAGNOSIS — Z7901 Long term (current) use of anticoagulants: Secondary | ICD-10-CM

## 2017-02-03 DIAGNOSIS — I484 Atypical atrial flutter: Secondary | ICD-10-CM | POA: Diagnosis not present

## 2017-02-03 DIAGNOSIS — I350 Nonrheumatic aortic (valve) stenosis: Secondary | ICD-10-CM

## 2017-02-03 NOTE — Assessment & Plan Note (Signed)
minimal, cath 2005 and August, 2011, 30% in 3 vessels

## 2017-02-03 NOTE — Assessment & Plan Note (Signed)
Pt has recently developed atrial flutter. His rate is a little fast-115-120. fortunately he has been asymptomatic. He has increased his Amiodarone to 200 mg BID.

## 2017-02-03 NOTE — Assessment & Plan Note (Signed)
pradaxa 75 mg BID for A fib

## 2017-02-03 NOTE — Assessment & Plan Note (Signed)
He has had multiple cardioversions. He saw dr Rayann Heman in 2013. RFA was recommended only if he failed Amiodarone.

## 2017-02-03 NOTE — Assessment & Plan Note (Addendum)
Increased to 200 mg BID 12/24/16.  LFTS WNL 01/31/17. TSH WNL Oct 2017.  CXR July 2017 without fibrotic changes

## 2017-02-03 NOTE — Assessment & Plan Note (Signed)
Chronic. 

## 2017-02-03 NOTE — Assessment & Plan Note (Signed)
Mild AS - last echo Feb 2018

## 2017-02-03 NOTE — Progress Notes (Signed)
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 The pt has a long history of  paroxysmal atrial fibrillation. He has had multiple cardioversions. His last cardioversion was Jan 2017.  He recently was noted to have atrial flutter. His Amiodarone was increased to 200 mg BID on 12/24/16. He is in the office today for follow up. Fortunately he has been asymptomatic with atrial flutter though he is concerned about his rate being fast- 100-120.   Had cardiac catheterization in August 2011 that showed nonobstructive coronary disease. Last echocardiogram 01/05/17 showed normal LV systolic function,  mild aortic stenosis. Moderate LAE, PA 33 mmHg.     Current Outpatient Prescriptions  Medication Sig Dispense Refill  . amiodarone (PACERONE) 200 MG tablet Take 200 mg by mouth 2 (two) times daily.    Marland Kitchen aspirin EC 81 MG tablet Take 81 mg by mouth every Monday, Wednesday, and Friday.    Marland Kitchen atorvastatin (LIPITOR) 10 MG tablet Take 1 tablet (10 mg total) by mouth daily at 6 PM. 30 tablet 1  . ELIQUIS 2.5 MG TABS tablet TAKE 1 TABLET BY MOUTH 2 TIMES DAILY 60 tablet 11  . PRIMIDONE PO Take 100 mg by mouth daily.     No current facility-administered medications for this visit.     Allergies  Allergen Reactions  . Chlorhexidine Rash  . Guaifenesin & Derivatives Itching  . Keflex [Cephalexin] Diarrhea  . Oxycodone Itching and Rash  . Oxycontin [Oxycodone Hcl] Itching and Rash  . Penicillins Other (See Comments)    Caused fungal reaction Has patient had a PCN reaction causing immediate rash, facial/tongue/throat swelling, SOB or lightheadedness with hypotension: No Has patient had a PCN reaction causing severe rash involving mucus membranes or skin necrosis: No Has patient had a PCN reaction that required hospitalization No Has patient  had a PCN reaction occurring within the last 10 years: No If all of the above answers are "NO", then may proceed with Cephalosporin use.     Past Medical History:  Diagnosis Date  . Anemia   . Aortic insufficiency    Mild, echo, July, 2011  . Aortic stenosis    Mild, echo, July, 2011  . Atrial fibrillation [facility]) 2011, 2015   Multaq Started September, 2011, dose adjusted October, 2011, 200 mg a.m., 400 mg p.o.  . Bradycardia    Sinus bradycardia while on 25 Lopressor twice a day October, 2013  . Carotid artery disease ([place])    Doppler, 2008 no significant abnormality  . Cervical spine disease   . Chronic anticoagulation 01/27/2015   pradaxa 75 mg BID for A fib  . Coronary artery disease    minimal, cath 2005 / catheterization August, 2011, 30% in 3 vessels, normal LV function  . Diarrhea    Chronic  . Drug therapy    Pradaxa Started July, 2011  . Ejection fraction    EF 60%, echo, 2009  /  TEE normal August, 2011 /   EF 60%, echo, July, 2011  . GERD (gastroesophageal reflux disease)    Esophageal dilatation in 1992, some esophageal spasm  . Hyperlipidemia   . Incomplete right bundle branch block    August, 2012  . Leg fatigue    June, 2014  . Memory change    June, 2014  . Monoclonal gammopathy  granfortuna  . MR (mitral regurgitation) 2009   mild, Echo, July, 2011  . Painless hematuria 03/27/2012   Occurred x 2 5/12 & 5/13 on Pradaxa 75 mg BID  . Prostate cancer ([place]) 11/22/2011  . Renal insufficiency    Prior creatinine 1.2 /  September, 2011.. 1.7  /  October, 2011.. 1.8  . S/P transurethral resection of prostate 2004   2004  . TIA (transient ischemic attack)    Dr. Erling Cruz,, question in the past. when off ASA for 10 days    Social History   Social History  . Marital status: Widowed    Spouse name: N/A  . Number of children: N/A  . 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 Smoking status:  Former Research scientist (life sciences)  . Smokeless tobacco: Never Used  . Alcohol use 1.2 oz/week    1 Glasses of wine, 1 Shots of liquor per week     Comment: Daily  . Drug use: No  . Sexual activity: Not on file   Other Topics Concern  . Not on file   Social History Narrative  . No narrative on file     No family history on file.   Review of Systems: General: negative for chills, fever, night sweats or weight changes.  Cardiovascular: negative for chest pain, dyspnea on exertion, edema, orthopnea, palpitations, paroxysmal nocturnal dyspnea or shortness of breath Dermatological: negative for rash Respiratory: negative for cough or wheezing Urologic: negative for hematuria Abdominal: negative for nausea, vomiting, diarrhea, bright red blood per rectum, melena, or hematemesis Neurologic: negative for visual changes, syncope, or dizziness All other systems reviewed and are otherwise negative except as noted above.    Blood pressure (!) 158/102, pulse (!) 118, height 5\' 7"  (1.702 m), weight 193 lb (87.5 kg).  General appearance: alert, cooperative and no distress Neck: no carotid bruit and no JVD Lungs: clear to auscultation bilaterally Heart: regular rate and rhythm and 2/6 AS murmur, increased rate Extremities: extremities normal, atraumatic, no cyanosis or edema Skin: Skin color, texture, turgor normal. No rashes or lesions Neurologic: Grossly normal, slight resting tremor  EKG A flutter with VR 118, RBBB  ASSESSMENT AND PLAN:   Atrial flutter ([facility]) Pt has recently developed atrial flutter. His rate is a little fast-115-120. fortunately he has been asymptomatic. He has increased his Amiodarone to 200 mg BID.   Atrial fibrillation [facility]) He has had multiple cardioversions. He saw dr Rayann Heman in 2013. RFA was recommended only if he failed Amiodarone.   Chronic anticoagulation pradaxa 75 mg BID for A fib  Aortic stenosis Mild AS - last echo Feb 2018  Coronary artery disease minimal, cath  2005 and August, 2011, 30% in 3 vessels  On amiodarone therapy Increased to 200 mg BID 12/24/16.  LFTS WNL 01/31/17. TSH WNL Oct 2017.  CXR July 2017 without fibrotic changes  Right bundle branch block Chronic   PLAN  Discussed with Dr Stanford Breed over the phone. We recommend DCCV on increased Amiodarone. Flutter ablation was also discussed but at this time Dr Stanford Breed recomends DCCV. The pt wants to think about this. He has a follow up with Dr Stanford Breed on 4/9 and he'll keep this. He'll continue BID Amiodarone till then.   Kerin Ransom PA-C 02/03/2017 10:53 AM

## 2017-02-03 NOTE — Patient Instructions (Signed)
CONTINUE taking Amiodarone 200 mg twice daily.  Keep your follow-up appointment with Dr Stanford Breed - Monday, April 9th, 2018 at 11:20a.

## 2017-02-04 ENCOUNTER — Telehealth: Payer: Self-pay | Admitting: *Deleted

## 2017-02-04 NOTE — Telephone Encounter (Signed)
-----   Message from Annia Belt, MD sent at 02/02/2017  8:34 AM EDT ----- Call Dr [name] - lab no significant change from baseline - he can review on my chart; lipid panel normal; I will forward to Dr Stanford Breed

## 2017-02-04 NOTE — Telephone Encounter (Signed)
Pt called / informed "lab no significant change from baseline - he can review on my chart; lipid panel normal; I will forward to Dr Stanford Breed" per Dr Beryle Beams. Stated he received your e- mail and viewed results from My Chart.

## 2017-02-08 NOTE — Progress Notes (Signed)
HPI: Follow-up paroxysmal atrial fibrillation. Had cardiac catheterization in August 2011 that showed nonobstructive coronary disease. Abdominal ultrasound July 2014 showed no aneurysm. Developed recurrent atrial fibrillation and required repeat cardioversion on 12/12/2015. Echocardiogram repeated 2/18. LV function normal; mild AS, mild to moderate MR, moderate LAE and mild to moderate AI. When I last saw the patient he was having dizzy spells. 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 Seen by Kerin Ransom 3/22 and DCCV recommended. Since last seen, he has some fatigue. He denies dyspnea, chest pain, palpitations or syncope. No bleeding.  Current Outpatient Prescriptions  Medication Sig Dispense Refill  . amiodarone (PACERONE) 200 MG tablet Take 200 mg by mouth 2 (two) times daily.    Marland Kitchen aspirin EC 81 MG tablet Take 81 mg by mouth every Monday, Wednesday, and Friday.    Marland Kitchen atorvastatin (LIPITOR) 10 MG tablet Take 1 tablet (10 mg total) by mouth daily at 6 PM. 30 tablet 1  . ELIQUIS 2.5 MG TABS tablet TAKE 1 TABLET BY MOUTH 2 TIMES DAILY 60 tablet 11  . PRIMIDONE PO Take 50 mg by mouth 2 (two) times daily.      No current facility-administered medications for this visit.      Past Medical History:  Diagnosis Date  . Anemia   . Aortic insufficiency    Mild, echo, July, 2011  . Aortic stenosis    Mild, echo, July, 2011  . Atrial fibrillation [facility]) 2011, 2015   Multaq Started September, 2011, dose adjusted October, 2011, 200 mg a.m., 400 mg p.o.  . Bradycardia    Sinus bradycardia while on 25 Lopressor twice a day October, 2013  . Carotid artery disease ([place])    Doppler, 2008 no significant abnormality  . Cervical spine disease   . Chronic anticoagulation 01/27/2015   pradaxa 75 mg BID for A fib  . Coronary artery disease    minimal, cath 2005 / catheterization August, 2011, 30% in 3 vessels, normal LV function  . Diarrhea     Chronic  . Drug therapy    Pradaxa Started July, 2011  . Ejection fraction    EF 60%, echo, 2009  /  TEE normal August, 2011 /   EF 60%, echo, July, 2011  . GERD (gastroesophageal reflux disease)    Esophageal dilatation in 1992, some esophageal spasm  . Hyperlipidemia   . Incomplete right bundle branch block    August, 2012  . Leg fatigue    June, 2014  . Memory change    June, 2014  . Monoclonal gammopathy    granfortuna  . MR (mitral regurgitation) 2009   mild, Echo, July, 2011  . Painless hematuria 03/27/2012   Occurred x 2 5/12 & 5/13 on Pradaxa 75 mg BID  . Prostate cancer ([place]) 11/22/2011  . Renal insufficiency    Prior creatinine 1.2 /  September, 2011.. 1.7  /  October, 2011.. 1.8  . S/P transurethral resection of prostate 2004   2004  . TIA (transient ischemic attack)    Dr. Erling Cruz,, question in the past. when off ASA for 10 days    Past Surgical History:  Procedure Laterality Date  . CARDIAC CATHETERIZATION    . CARDIOVERSION  08/01/2012   Procedure: CARDIOVERSION;  Surgeon: Carlena Bjornstad, MD;  Location: [facility] ENDOSCOPY;  Service: Cardiovascular;  Laterality: N/A;  . CARDIOVERSION N/A 11/29/2013   Procedure: CARDIOVERSION;  Surgeon: Carlena Bjornstad, MD;  Location: [place] ENDOSCOPY;  Service: Cardiovascular;  Laterality: N/A;  . CARDIOVERSION N/A 11/27/2014   Procedure: CARDIOVERSION;  Surgeon: Carlena Bjornstad, MD;  Location: [facility] ENDOSCOPY;  Service: Cardiovascular;  Laterality: N/A;  . CARDIOVERSION N/A 03/12/2015   Procedure: CARDIOVERSION;  Surgeon: Carlena Bjornstad, MD;  Location: [facility] ENDOSCOPY;  Service: Cardiovascular;  Laterality: N/A;  . CARDIOVERSION N/A 12/12/2015   Procedure: CARDIOVERSION;  Surgeon: Fay Records, MD;  Location: [place];  Service: Cardiovascular;  Laterality: N/A;  . CATARACT EXTRACTION    . COLONOSCOPY    . EYE SURGERY Bilateral    cataract extraction  . Fibular fracture repair    . HERNIA REPAIR    . History of TURP    . OPEN REDUCTION  INTERNAL FIXATION (ORIF) METACARPAL Left 06/22/2014   Procedure: OPEN REDUCTION INTERNAL FIXATION (ORIF) LEFT HAND METACARPAL;  Surgeon: Roseanne Kaufman, MD;  Location: [place];  Service: Orthopedics;  Laterality: Left;  . ORIF ORBITAL FRACTURE Bilateral 06/22/2014   Procedure: OPEN REDUCTION INTERNAL FIXATION (ORIF) BILATERAL LEFORTE1 FRACTURE OF MAXILLA, HYBRID ARCH BARS ;  Surgeon: Izora Gala, MD;  Location: [place];  Service: ENT;  Laterality: Bilateral;  . SHOULDER ARTHROSCOPY WITH ROTATOR CUFF REPAIR Right 01/23/2014   DR Noemi Chapel   . SHOULDER ARTHROSCOPY WITH ROTATOR CUFF REPAIR Right 01/23/2014   Procedure: RIGHT SHOULDER ARTHROSCOPY WITH DEBRIDEMENT AND ROTATOR CUFF REPAIR;  Surgeon: Lorn Junes, MD;  Location: [place];  Service: Orthopedics;  Laterality: Right;    Social History   Social History  . Marital status: Widowed    Spouse name: N/A  . Number of children: N/A  . 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 Smoking status: Former Research scientist (life sciences)  . Smokeless tobacco: Never Used  . Alcohol use 1.2 oz/week    1 Glasses of wine, 1 Shots of liquor per week     Comment: Daily  . Drug use: No  . Sexual activity: Not on file   Other Topics Concern  . Not on file   Social History Narrative  . No narrative on file    History reviewed. No pertinent family history.  ROS: no fevers or chills, productive cough, hemoptysis, dysphasia, odynophagia, melena, hematochezia, dysuria, hematuria, rash, seizure activity, orthopnea, PND, pedal edema, claudication. Remaining systems are negative.  Physical Exam: Well-developed well-nourished in no acute distress.  Skin is warm and dry.  HEENT is normal.  Neck is supple.  Chest is clear to auscultation with normal expansion.  Cardiovascular exam is irregular, 2/6 systolic murmur. S2 is not diminished. Abdominal exam nontender or distended. No masses palpated. Extremities show no  edema. neuro grossly intact  ECG- Atrial fibrillation at a rate of 105. Right bundle branch block. Left anterior fascicular block. personally reviewed  A/P  1 atrial fibrillation-patient is back in atrial fibrillation. Continue amiodarone. Continue apixaban. Check chest x-ray and TSH. Patient is symptomatic. We will arrange a cardioversion. Hopefully he will hold sinus rhythm. Decrease amiodarone to 200 mg daily. If atrial fibrillation recurs in the near future we will consider asking Dr Rayann Heman to review at pt's request for ablation although I doubt he would be a good candidate given his age.  2 aortic valve disease-mild aortic stenosis and mild to moderate aortic insufficiency on most recent echocardiogram.  3 coronary artery disease-continue statin.  4 hyperlipidemia-continue statin.  Kirk Ruths, MD

## 2017-02-09 ENCOUNTER — Encounter: Payer: Self-pay | Admitting: Cardiology

## 2017-02-21 ENCOUNTER — Encounter: Payer: Self-pay | Admitting: *Deleted

## 2017-02-21 ENCOUNTER — Ambulatory Visit (INDEPENDENT_AMBULATORY_CARE_PROVIDER_SITE_OTHER): Payer: Medicare Other | Admitting: Cardiology

## 2017-02-21 ENCOUNTER — Encounter: Payer: Self-pay | Admitting: Cardiology

## 2017-02-21 VITALS — BP 130/86 | HR 105 | Ht 67.0 in | Wt 191.0 lb

## 2017-02-21 DIAGNOSIS — I350 Nonrheumatic aortic (valve) stenosis: Secondary | ICD-10-CM

## 2017-02-21 DIAGNOSIS — E7800 Pure hypercholesterolemia, unspecified: Secondary | ICD-10-CM

## 2017-02-21 DIAGNOSIS — I4891 Unspecified atrial fibrillation: Secondary | ICD-10-CM

## 2017-02-21 DIAGNOSIS — I2510 Atherosclerotic heart disease of native coronary artery without angina pectoris: Secondary | ICD-10-CM

## 2017-02-21 MED ORDER — AMIODARONE HCL 200 MG PO TABS: 200.0000 mg | ORAL_TABLET | Freq: Every day | ORAL | Status: DC

## 2017-02-21 NOTE — Patient Instructions (Signed)
Medication Instructions:   DECREASE AMIODARONE TO 200 MG ONCE DAILY  Labwork:  Your physician recommends that you return for lab work AT [place]  Testing/Procedures:  A chest x-ray takes a picture of the organs and structures inside the chest, including the heart, lungs, and blood vessels. This test can show several things, including, whether the heart is enlarges; whether fluid is building up in the lungs; and whether pacemaker / defibrillator leads are still in place.   Your physician has recommended that you have a Cardioversion (DCCV). Electrical Cardioversion uses a jolt of electricity to your heart either through paddles or wired patches attached to your chest. This is a controlled, usually prescheduled, procedure. Defibrillation is done under light anesthesia in the hospital, and you usually go home the day of the procedure. This is done to get your heart back into a normal rhythm. You are not awake for the procedure. Please see the instruction sheet given to you today.    Follow-Up:  Your physician recommends that you schedule a follow-up appointment in: [place]   If you need a refill on your cardiac medications before your next appointment, please call your pharmacy.

## 2017-02-22 ENCOUNTER — Other Ambulatory Visit: Payer: Self-pay | Admitting: Oncology

## 2017-02-22 DIAGNOSIS — N289 Disorder of kidney and ureter, unspecified: Secondary | ICD-10-CM

## 2017-02-22 DIAGNOSIS — C61 Malignant neoplasm of prostate: Secondary | ICD-10-CM

## 2017-02-22 DIAGNOSIS — Z7901 Long term (current) use of anticoagulants: Secondary | ICD-10-CM

## 2017-02-22 DIAGNOSIS — Z79899 Other long term (current) drug therapy: Secondary | ICD-10-CM

## 2017-02-22 DIAGNOSIS — D472 Monoclonal gammopathy: Secondary | ICD-10-CM

## 2017-02-24 ENCOUNTER — Encounter (HOSPITAL_COMMUNITY): Payer: Self-pay

## 2017-02-24 ENCOUNTER — Ambulatory Visit (HOSPITAL_COMMUNITY): Payer: Medicare Other | Admitting: Certified Registered Nurse Anesthetist

## 2017-02-24 ENCOUNTER — Ambulatory Visit (HOSPITAL_COMMUNITY)
Admission: RE | Admit: 2017-02-24 | Discharge: 2017-02-24 | Disposition: A | Discharge status: Home / Self Care | Payer: Medicare Other | Source: Ambulatory Visit | Attending: Internal Medicine | Admitting: Internal Medicine

## 2017-02-24 ENCOUNTER — Encounter (HOSPITAL_COMMUNITY)
Admission: RE | Disposition: A | Discharge status: Home / Self Care | Payer: Self-pay | Source: Ambulatory Visit | Attending: Internal Medicine

## 2017-02-24 DIAGNOSIS — Z87891 Personal history of nicotine dependence: Secondary | ICD-10-CM | POA: Diagnosis not present

## 2017-02-24 DIAGNOSIS — D649 Anemia, unspecified: Secondary | ICD-10-CM | POA: Diagnosis not present

## 2017-02-24 DIAGNOSIS — K219 Gastro-esophageal reflux disease without esophagitis: Secondary | ICD-10-CM | POA: Insufficient documentation

## 2017-02-24 DIAGNOSIS — I2510 Atherosclerotic heart disease of native coronary artery without angina pectoris: Secondary | ICD-10-CM | POA: Insufficient documentation

## 2017-02-24 DIAGNOSIS — I739 Peripheral vascular disease, unspecified: Secondary | ICD-10-CM | POA: Insufficient documentation

## 2017-02-24 DIAGNOSIS — I4891 Unspecified atrial fibrillation: Secondary | ICD-10-CM | POA: Diagnosis present

## 2017-02-24 HISTORY — PX: CARDIOVERSION: SHX1299

## 2017-02-24 LAB — POCT I-STAT, CHEM 8
BUN: 19 mg/dL (ref 6–20)
CALCIUM ION: 1.23 mmol/L (ref 1.15–1.40)
CHLORIDE: 105 mmol/L (ref 101–111)
CREATININE: 1.6 mg/dL — AB (ref 0.61–1.24)
GLUCOSE: 92 mg/dL (ref 65–99)
HCT: 37 % — ABNORMAL LOW (ref 39.0–52.0)
Hemoglobin: 12.6 g/dL — ABNORMAL LOW (ref 13.0–17.0)
Potassium: 4.2 mmol/L (ref 3.5–5.1)
SODIUM: 139 mmol/L (ref 135–145)
TCO2: 26 mmol/L (ref 0–100)

## 2017-02-24 SURGERY — CARDIOVERSION
Anesthesia: General

## 2017-02-24 MED ORDER — LIDOCAINE HCL (CARDIAC) 20 MG/ML IV SOLN
INTRAVENOUS | Status: DC | PRN
  Administered 2017-02-24: 60 mg via INTRAVENOUS

## 2017-02-24 MED ORDER — SODIUM CHLORIDE 0.9 % IV SOLN
250.0000 mL | INTRAVENOUS | Status: DC
  Administered 2017-02-24: 09:00:00 via INTRAVENOUS

## 2017-02-24 MED ORDER — SODIUM CHLORIDE 0.9% FLUSH: 3.0000 mL | INTRAVENOUS | Status: DC | PRN

## 2017-02-24 MED ORDER — SODIUM CHLORIDE 0.9% FLUSH: 3.0000 mL | Freq: Two times a day (BID) | INTRAVENOUS | Status: DC

## 2017-02-24 MED ORDER — PROPOFOL 10 MG/ML IV BOLUS
INTRAVENOUS | Status: DC | PRN
  Administered 2017-02-24: 50 mg via INTRAVENOUS

## 2017-02-24 MED ORDER — PROPOFOL 10 MG/ML IV BOLUS
INTRAVENOUS | Status: AC
  Filled 2017-02-24: qty 20

## 2017-02-24 NOTE — H&P (Signed)
    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 The various methods of treatment have been discussed with the patient and family. After consideration of risks, benefits and other options for treatment, the patient has consented to the procedure.  The patients' outpatient history has been reviewed, patient examined, and no change in status from most recent office note within the past 30 days. I have reviewed the patients' chart and labs and will proceed as planned. Questions were answered to the patient's satisfaction.   Pixie Casino, MD, [facility] Attending Cardiologist [place] [facility] 02/24/2017, 8:45 AM

## 2017-02-24 NOTE — Transfer of Care (Signed)
Immediate Anesthesia Transfer of Care Note  Patient: [name] Scull, MD  Procedure(s) Performed: Procedure(s): CARDIOVERSION (N/A)  Patient Location: PACU  Anesthesia Type:General  Level of Consciousness: awake, alert , oriented and patient cooperative  Airway & Oxygen Therapy: Patient Spontanous Breathing and Patient connected to nasal cannula oxygen  Post-op Assessment: Report given to RN and Post -op Vital signs reviewed and stable  Post vital signs: Reviewed and stable  Last Vitals:  Vitals:   02/24/17 0747  BP: (!) 159/110  Pulse: (!) 102  Resp: 11    Last Pain:  Vitals:   02/24/17 0747  TempSrc: Oral         Complications: No apparent anesthesia complications

## 2017-02-24 NOTE — Discharge Instructions (Signed)
Electrical Cardioversion, Care After °This sheet gives you information about how to care for yourself after your procedure. Your health care provider may also give you more specific instructions. If you have problems or questions, contact your health care provider. °What can I expect after the procedure? °After the procedure, it is common to have: °· Some redness on the skin where the shocks were given. °Follow these instructions at home: °· Do not drive for 24 hours if you were given a medicine to help you relax (sedative). °· Take over-the-counter and prescription medicines only as told by your health care provider. °· Ask your health care provider how to check your pulse. Check it often. °· Rest for 48 hours after the procedure or as told by your health care provider. °· Avoid or limit your caffeine use as told by your health care provider. °Contact a health care provider if: °· You feel like your heart is beating too quickly or your pulse is not regular. °· You have a serious muscle cramp that does not go away. °Get help right away if: °· You have discomfort in your chest. °· You are dizzy or you feel faint. °· You have trouble breathing or you are short of breath. °· Your speech is slurred. °· You have trouble moving an arm or leg on one side of your body. °· Your fingers or toes turn cold or blue. °This information is not intended to replace advice given to you by your health care provider. Make sure you discuss any questions you have with your health care provider. °Document Released: 08/22/2013 Document Revised: 06/04/2016 Document Reviewed: 05/07/2016 °Elsevier Interactive Patient Education © 2017 [employer] Inc[employer] ° °

## 2017-02-24 NOTE — Anesthesia Postprocedure Evaluation (Signed)
Anesthesia Post Note  Patient: [name] Scull, MD  Procedure(s) Performed: Procedure(s) (LRB): CARDIOVERSION (N/A)  Patient location during evaluation: PACU Anesthesia Type: General Level of consciousness: awake and alert Pain management: pain level controlled Vital Signs Assessment: post-procedure vital signs reviewed and stable Respiratory status: spontaneous breathing, nonlabored ventilation and respiratory function stable Cardiovascular status: blood pressure returned to baseline and stable Postop Assessment: no signs of nausea or vomiting Anesthetic complications: no       Last Vitals:  Vitals:   02/24/17 0925 02/24/17 0935  BP: (!) 132/57 (!) 143/60  Pulse: (!) 50 (!) 48  Resp: 14 13    Last Pain:  Vitals:   02/24/17 0747  TempSrc: Oral                 Anahita Cua,W. EDMOND

## 2017-02-24 NOTE — Anesthesia Preprocedure Evaluation (Signed)
Anesthesia Evaluation  Patient identified by MRN, date of birth, ID band Patient awake    Reviewed: Allergy & Precautions, H&P , NPO status , Patient's Chart, lab work & pertinent test results  Airway Mallampati: II  TM Distance: >3 FB Neck ROM: Full    Dental no notable dental hx. (+) Teeth Intact, Dental Advisory Given   Pulmonary neg pulmonary ROS, former smoker,    Pulmonary exam normal breath sounds clear to auscultation       Cardiovascular + CAD and + Peripheral Vascular Disease  + dysrhythmias Atrial Fibrillation + Valvular Problems/Murmurs AS, AI and MR  Rhythm:Irregular Rate:Normal     Neuro/Psych TIAnegative psych ROS   GI/Hepatic Neg liver ROS, GERD  Controlled,  Endo/Other  negative endocrine ROS  Renal/GU negative Renal ROS  negative genitourinary   Musculoskeletal   Abdominal   Peds  Hematology negative hematology ROS (+) anemia ,   Anesthesia Other Findings   Reproductive/Obstetrics negative OB ROS                             Anesthesia Physical Anesthesia Plan  ASA: III  Anesthesia Plan: General   Post-op Pain Management:    Induction: Intravenous  Airway Management Planned: Mask  Additional Equipment:   Intra-op Plan:   Post-operative Plan:   Informed Consent: I have reviewed the patients History and Physical, chart, labs and discussed the procedure including the risks, benefits and alternatives for the proposed anesthesia with the patient or authorized representative who has indicated his/her understanding and acceptance.   Dental advisory given  Plan Discussed with: CRNA  Anesthesia Plan Comments:         Anesthesia Quick Evaluation

## 2017-02-24 NOTE — CV Procedure (Signed)
    CARDIOVERSION NOTE  Procedure: Electrical Cardioversion Indications:  Atrial Fibrillation  Procedure Details:  Consent: Risks of procedure as well as the alternatives and risks of each were explained to the (patient/caregiver).  Consent for procedure obtained.  Time Out: Verified patient identification, verified procedure, site/side was marked, verified correct patient position, special equipment/implants available, medications/allergies/relevent history reviewed, required imaging and test results available.  Performed  Patient placed on cardiac monitor, pulse oximetry, supplemental oxygen as necessary.  Sedation given: Propofol per anesthesia Pacer pads placed anterior and posterior chest.  Cardioverted 1 time(s).  Cardioverted at 150J biphasic.  Impression: Findings: Post procedure EKG shows: sinus bradycardia Complications: None Patient did tolerate procedure well.  Plan: 1. Successful DCCV to sinus bradycardia with a single 150J biphasic shock.  Time Spent Directly with the Patient:  30 minutes   Pixie Casino, MD, [facility] Attending Cardiologist [place] 02/24/2017, 9:20 AM

## 2017-02-25 ENCOUNTER — Encounter (HOSPITAL_COMMUNITY): Payer: Self-pay | Admitting: Internal Medicine

## 2017-02-28 ENCOUNTER — Other Ambulatory Visit (INDEPENDENT_AMBULATORY_CARE_PROVIDER_SITE_OTHER): Payer: Medicare Other

## 2017-02-28 DIAGNOSIS — Z79899 Other long term (current) drug therapy: Secondary | ICD-10-CM

## 2017-02-28 DIAGNOSIS — D472 Monoclonal gammopathy: Secondary | ICD-10-CM

## 2017-02-28 DIAGNOSIS — C61 Malignant neoplasm of prostate: Secondary | ICD-10-CM

## 2017-02-28 DIAGNOSIS — Z7901 Long term (current) use of anticoagulants: Secondary | ICD-10-CM

## 2017-02-28 DIAGNOSIS — N289 Disorder of kidney and ureter, unspecified: Secondary | ICD-10-CM

## 2017-02-28 NOTE — Addendum Note (Signed)
Addended by: Truddie Crumble on: 02/28/2017 11:28 AM   Modules accepted: Orders

## 2017-03-01 LAB — KAPPA/LAMBDA LIGHT CHAINS
IG KAPPA FREE LIGHT CHAIN: 66.6 mg/L — AB (ref 3.3–19.4)
IG LAMBDA FREE LIGHT CHAIN: 12.6 mg/L (ref 5.7–26.3)
Kappa/Lambda FluidC Ratio: 5.29 — ABNORMAL HIGH (ref 0.26–1.65)

## 2017-03-01 LAB — CBC WITH DIFFERENTIAL/PLATELET
BASOS ABS: 0 10*3/uL (ref 0.0–0.2)
Basos: 0 %
EOS (ABSOLUTE): 0.4 10*3/uL (ref 0.0–0.4)
Eos: 4 %
HEMOGLOBIN: 10.5 g/dL — AB (ref 13.0–17.7)
Hematocrit: 31.2 % — ABNORMAL LOW (ref 37.5–51.0)
IMMATURE GRANS (ABS): 0 10*3/uL (ref 0.0–0.1)
IMMATURE GRANULOCYTES: 0 %
LYMPHS: 24 %
Lymphocytes Absolute: 2.1 10*3/uL (ref 0.7–3.1)
MCH: 33.8 pg — ABNORMAL HIGH (ref 26.6–33.0)
MCHC: 33.7 g/dL (ref 31.5–35.7)
MCV: 100 fL — ABNORMAL HIGH (ref 79–97)
MONOCYTES: 11 %
Monocytes Absolute: 0.9 10*3/uL (ref 0.1–0.9)
Neutrophils Absolute: 5.5 10*3/uL (ref 1.4–7.0)
Neutrophils: 61 %
Platelets: 239 10*3/uL (ref 150–379)
RBC: 3.11 x10E6/uL — AB (ref 4.14–5.80)
RDW: 14.6 % (ref 12.3–15.4)
WBC: 8.9 10*3/uL (ref 3.4–10.8)

## 2017-03-01 LAB — IGG, IGA, IGM
IGA/IMMUNOGLOBULIN A, SERUM: 77 mg/dL (ref 61–437)
IGG (IMMUNOGLOBIN G), SERUM: 2574 mg/dL — AB (ref 700–1600)
IGM (IMMUNOGLOBULIN M), SRM: 67 mg/dL (ref 15–143)

## 2017-03-01 LAB — TSH: TSH: 2.03 u[IU]/mL (ref 0.450–4.500)

## 2017-03-01 LAB — T4, FREE: Free T4: 1.49 ng/dL (ref 0.82–1.77)

## 2017-03-01 LAB — PSA: Prostate Specific Ag, Serum: 2.3 ng/mL (ref 0.0–4.0)

## 2017-03-02 ENCOUNTER — Other Ambulatory Visit: Payer: Self-pay | Admitting: Oncology

## 2017-03-02 NOTE — Telephone Encounter (Signed)
Refill request sent to [facility]-please confirm dose.Despina Hidden Cassady4/18/201810:57 AM

## 2017-03-08 ENCOUNTER — Encounter: Payer: Self-pay | Admitting: Oncology

## 2017-03-08 ENCOUNTER — Ambulatory Visit (INDEPENDENT_AMBULATORY_CARE_PROVIDER_SITE_OTHER): Payer: Medicare Other | Admitting: Oncology

## 2017-03-08 VITALS — BP 181/72 | HR 54 | Temp 98.0°F | Ht 68.0 in | Wt 192.5 lb

## 2017-03-08 DIAGNOSIS — Z885 Allergy status to narcotic agent status: Secondary | ICD-10-CM

## 2017-03-08 DIAGNOSIS — C61 Malignant neoplasm of prostate: Secondary | ICD-10-CM | POA: Diagnosis not present

## 2017-03-08 DIAGNOSIS — Z87891 Personal history of nicotine dependence: Secondary | ICD-10-CM

## 2017-03-08 DIAGNOSIS — Z8781 Personal history of (healed) traumatic fracture: Secondary | ICD-10-CM | POA: Diagnosis not present

## 2017-03-08 DIAGNOSIS — Z923 Personal history of irradiation: Secondary | ICD-10-CM | POA: Diagnosis not present

## 2017-03-08 DIAGNOSIS — I080 Rheumatic disorders of both mitral and aortic valves: Secondary | ICD-10-CM | POA: Diagnosis not present

## 2017-03-08 DIAGNOSIS — G252 Other specified forms of tremor: Secondary | ICD-10-CM | POA: Diagnosis not present

## 2017-03-08 DIAGNOSIS — M549 Dorsalgia, unspecified: Secondary | ICD-10-CM

## 2017-03-08 DIAGNOSIS — D472 Monoclonal gammopathy: Secondary | ICD-10-CM

## 2017-03-08 DIAGNOSIS — Z7901 Long term (current) use of anticoagulants: Secondary | ICD-10-CM

## 2017-03-08 DIAGNOSIS — Z880 Allergy status to penicillin: Secondary | ICD-10-CM

## 2017-03-08 DIAGNOSIS — I482 Chronic atrial fibrillation: Secondary | ICD-10-CM | POA: Diagnosis not present

## 2017-03-08 DIAGNOSIS — Z881 Allergy status to other antibiotic agents status: Secondary | ICD-10-CM | POA: Diagnosis not present

## 2017-03-08 DIAGNOSIS — Z888 Allergy status to other drugs, medicaments and biological substances status: Secondary | ICD-10-CM

## 2017-03-08 NOTE — Patient Instructions (Signed)
Lab 3 months and 6 months Visit 6 months

## 2017-03-08 NOTE — Progress Notes (Signed)
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 03/08/2017 1:20 PM   Principle Diagnosis: Encounter Diagnoses  Name Primary?  . Prostate cancer ([place])   . Monoclonal gammopathy   . Chronic anticoagulation Yes  Clinical summary:    Interim History:   He just had his sixth cardioversion on February 24, 2017!  He is back in sinus rhythm.  Performance status somewhat better than when he was in A. fib.  I anticipate the longer he remains in sinus the better his performance status will get.  He was felt to be at excessive risk for a radiofrequency ablation procedure.  He will be 83 years old this July. On the day following the cardioversion, he had the sudden onset of numbness ulnar aspect of his left hand with blanching of the skin.  No loss of motor function.  It did affect his ability to play the piano.  It is already improving.  He notes that he has had previous hand surgery with screw fixation on the right wrist after a fall but he thinks that he had a small embolic event following the cardioversion.  I do not doubt that this is true.  He is on Eliquis anticoagulation but I have him on just 2.5 mg twice daily in view of his advanced age and compromised renal function with estimated GFR of 35 mL/min and serum creatinine 1.6-1.7.  Medications: reviewed  Allergies:  Allergies  Allergen Reactions  . Chlorhexidine Rash  . Guaifenesin & Derivatives Itching  . Keflex [Cephalexin] Diarrhea  . Oxycodone Itching and Rash  . Oxycontin [Oxycodone Hcl] Itching and Rash  . Penicillins Other (See Comments)    Caused fungal reaction Has patient had a PCN reaction causing immediate rash, facial/tongue/throat swelling, SOB or lightheadedness with hypotension: No Has patient had a PCN reaction causing severe rash involving mucus membranes or skin necrosis: No Has patient had a PCN reaction that required hospitalization No Has patient had a PCN reaction occurring  within the last 10 years: No If all of the above answers are "NO", then may proceed with Cephalosporin use.     Review of Systems: See interim history Remaining ROS negative:   Physical Exam: Blood pressure (!) 181/72, pulse (!) 54, temperature 98 F (36.7 C), temperature source Oral, height 5\' 8"  (1.727 m), weight 192 lb 8 oz (87.3 kg), SpO2 100 %. Wt Readings from Last 3 Encounters:  03/08/17 192 lb 8 oz (87.3 kg)  02/21/17 191 lb (86.6 kg)  02/03/17 193 lb (87.5 kg)     General appearance: Increasingly frail, Caucasian man HENNT: Pharynx no erythema, exudate, mass, or ulcer. No thyromegaly or thyroid nodules Lymph nodes: No cervical, supraclavicular, or axillary lymphadenopathy except for a persistent 2-3 cm lymph node in the left axilla unchanged on multiple examinations. Breasts:  Lungs: Clear to auscultation, resonant to percussion throughout Heart: 3/6 mitral regurgitant murmur and 3/6 aortic stenosis murmur, no gallop, no rub, no click, no edema Abdomen: Soft, nontender, normal bowel sounds, no mass, no organomegaly Extremities: No edema, no calf tenderness Musculoskeletal: no joint deformities.  Significant back pain when he tried to lie supine on the exam bench. GU:  Vascular: Carotid pulses 2+, no bruits, distal pulses: Dorsalis pedis 1+ symmetric Neurologic: Alert, oriented, PERRLA, optic discs sharp and vessels normal, no hemorrhage or exudate, cranial nerves grossly normal, motor strength 5 over 5, reflexes 1+ symmetric, upper body coordination normal, gait unsteady due to arthritis problems Skin: No  rash or ecchymosis  Lab Results: CBC W/Diff    Component Value Date/Time   WBC 8.9 02/28/2017 1048   WBC 9.0 12/09/2015 1215   RBC 3.11 (L) 02/28/2017 1048   RBC 3.81 (L) 12/09/2015 1215   HGB 12.6 (L) 02/24/2017 0814   HGB 12.1 (L) 01/14/2014 0804   HCT 31.2 (L) 02/28/2017 1048   HCT 36.5 (L) 01/14/2014 0804   PLT 239 02/28/2017 1048   MCV 100 (H) 02/28/2017  1048   MCV 99.8 (H) 01/14/2014 0804   MCH 33.8 (H) 02/28/2017 1048   MCH 33.6 12/09/2015 1215   MCHC 33.7 02/28/2017 1048   MCHC 33.1 12/09/2015 1215   RDW 14.6 02/28/2017 1048   RDW 13.7 01/14/2014 0804   LYMPHSABS 2.1 02/28/2017 1048   LYMPHSABS 2.6 01/14/2014 0804   MONOABS 0.8 05/14/2015 0955   MONOABS 0.7 01/14/2014 0804   EOSABS 0.4 02/28/2017 1048   BASOSABS 0.0 02/28/2017 1048   BASOSABS 0.0 01/14/2014 0804     Chemistry      Component Value Date/Time   NA 139 02/24/2017 0814   NA 139 01/31/2017 1048   NA 139 01/14/2014 0804   K 4.2 02/24/2017 0814   K 4.8 01/14/2014 0804   CL 105 02/24/2017 0814   CL 107 03/19/2013 0949   CO2 22 01/31/2017 1048   CO2 25 01/14/2014 0804   BUN 19 02/24/2017 0814   BUN 18 01/31/2017 1048   BUN 16.7 01/14/2014 0804   CREATININE 1.60 (H) 02/24/2017 0814   CREATININE 1.63 (H) 12/09/2015 1215   CREATININE 1.7 (H) 01/14/2014 0804      Component Value Date/Time   CALCIUM 8.8 01/31/2017 1048   CALCIUM 9.4 01/14/2014 0804   ALKPHOS 53 01/31/2017 1048   ALKPHOS 45 01/14/2014 0804   AST 25 01/31/2017 1048   AST 21 01/14/2014 0804   ALT 26 01/31/2017 1048   ALT 20 01/14/2014 0804   BILITOT 0.4 01/31/2017 1048   BILITOT 0.71 01/14/2014 0804    Hemoglobin slightly down from baseline at 10.5 with baseline 11-11.5 Serum kappa lambda light chain ratio remains stable at 5.29 done February 28, 2017 Serum immunoglobulin IgG stable at 2574 mg percent   Radiological Studies: No results found.  Impression:  #1.  IgG kappa monoclonal gammopathy of undetermined significance Hematologic parameters overall stable.  Continue observation alone.  #2.  Prostate cancer treated with a radiofrequency ablation procedure PSA detectable but stable over time  #3.  Chronic refractory atrial fibrillation now status post cardioversion 6.  He continues on reduced dose anticoagulation with Eliquis.  Rhythm controlled with amiodarone.  #4.  Resting  tremor. He did have significant improvement on primidone.  I have slowly titrated the dose.  Although there may be an interaction with the amiodarone, I think it would be quite minimal in view of the low doses we are using.  He still enjoys playing the piano.  The tremor was really preventing this.  #5.  Valvular heart disease both mitral and aortic Currently asymptomatic.  #6.  History of traumatic fractures of his facial bones and right wrist and left clavicle in August 2015.  After a fall requiring surgery.  CC: Patient Care Team: Annia Belt, MD as PCP - General (Oncology) Ronald Lobo, MD (Gastroenterology) Carolan Clines, MD (Urology) Lelon Perla, MD as Consulting Physician (Cardiology)   Murriel Hopper, MD, [place]  Hematology-Oncology/Internal Medicine     4/24/20181:20 PM

## 2017-03-21 ENCOUNTER — Encounter: Payer: Self-pay | Admitting: Cardiology

## 2017-03-21 ENCOUNTER — Other Ambulatory Visit: Payer: Self-pay | Admitting: Cardiology

## 2017-03-30 NOTE — Progress Notes (Signed)
HPI: Follow-up paroxysmal atrial fibrillation. Had cardiac catheterization in August 2011 that showed nonobstructive coronary disease. Abdominal ultrasound July 2014 showedno aneurysm. Echocardiogram repeated 2/18. LV function normal; mild AS, mild to moderate MR, moderate LAE and mild to moderate AI. Patient had successful cardioversion again on 02/24/2017. Since last seen, patient denies dyspnea, chest pain, palpitations, syncope or bleeding. He states he has good days and bad days in terms of energy.  Current Outpatient Prescriptions  Medication Sig Dispense Refill  . amiodarone (PACERONE) 200 MG tablet Take 1 tablet (200 mg total) by mouth daily.    Marland Kitchen aspirin EC 81 MG tablet Take 81 mg by mouth every Monday, Wednesday, and Friday.    Marland Kitchen atorvastatin (LIPITOR) 10 MG tablet Take 1 tablet (10 mg total) by mouth daily at 6 PM. 30 tablet 1  . ELIQUIS 2.5 MG TABS tablet TAKE 1 TABLET BY MOUTH 2 TIMES DAILY 60 tablet 11  . primidone (MYSOLINE) 50 MG tablet TAKE 1 TABLET BY MOUTH ONCE DAILY FOR 1 WEEK THEN INCREASE TO 2 TABLETS ONCE DAILY IF TOLERATED 60 tablet 2   No current facility-administered medications for this visit.      Past Medical History:  Diagnosis Date  . Anemia   . Aortic insufficiency    Mild, echo, July, 2011  . Aortic stenosis    Mild, echo, July, 2011  . Atrial fibrillation [facility]) 2011, 2015   Multaq Started September, 2011, dose adjusted October, 2011, 200 mg a.m., 400 mg p.o.  . Bradycardia    Sinus bradycardia while on 25 Lopressor twice a day October, 2013  . Carotid artery disease ([place])    Doppler, 2008 no significant abnormality  . Cervical spine disease   . Chronic anticoagulation 01/27/2015   pradaxa 75 mg BID for A fib  . Coronary artery disease    minimal, cath 2005 / catheterization August, 2011, 30% in 3 vessels, normal LV function  . Diarrhea    Chronic  . Drug therapy    Pradaxa Started July, 2011  . Ejection fraction    EF 60%, echo, 2009  /   TEE normal August, 2011 /   EF 60%, echo, July, 2011  . GERD (gastroesophageal reflux disease)    Esophageal dilatation in 1992, some esophageal spasm  . Hyperlipidemia   . Incomplete right bundle branch block    August, 2012  . Leg fatigue    June, 2014  . Memory change    June, 2014  . Monoclonal gammopathy    [facility]fortuna  . MR (mitral regurgitation) 2009   mild, Echo, July, 2011  . Painless hematuria 03/27/2012   Occurred x 2 5/12 & 5/13 on Pradaxa 75 mg BID  . Prostate cancer ([place]) 11/22/2011  . Renal insufficiency    Prior creatinine 1.2 /  September, 2011.. 1.7  /  October, 2011.. 1.8  . S/P transurethral resection of prostate 2004   2004  . TIA (transient ischemic attack)    Dr. Erling Cruz,, question in the past. when off ASA for 10 days    Past Surgical History:  Procedure Laterality Date  . CARDIAC CATHETERIZATION    . CARDIOVERSION  08/01/2012   Procedure: CARDIOVERSION;  Surgeon: Carlena Bjornstad, MD;  Location[facility] ENDOSCOPY;  Service: Cardiovascular;  Laterality: N/A;  . CARDIOVERSION N/A 11/29/2013   Procedure: CARDIOVERSION;  Surgeon: Carlena Bjornstad, MD;  Location: [place];  Service: Cardiovascular;  Laterality: N/A;  . CARDIOVERSION N/A 11/27/2014   Procedure:  CARDIOVERSION;  Surgeon: Carlena Bjornstad, MD;  Location[facility] ENDOSCOPY;  Service: Cardiovascular;  Laterality: N/A;  . CARDIOVERSION N/A 03/12/2015   Procedure: CARDIOVERSION;  Surgeon: Carlena Bjornstad, MD;  Location: [place];  Service: Cardiovascular;  Laterality: N/A;  . CARDIOVERSION N/A 12/12/2015   Procedure: CARDIOVERSION;  Surgeon: Fay Records, MD;  Location: [facility] ENDOSCOPY;  Service: Cardiovascular;  Laterality: N/A;  . CARDIOVERSION N/A 02/24/2017   Procedure: CARDIOVERSION;  Surgeon: Pixie Casino, MD;  Location: [place];  Service: Cardiovascular;  Laterality: N/A;  . CATARACT EXTRACTION    . COLONOSCOPY    . EYE SURGERY Bilateral    cataract extraction  . Fibular fracture repair    . HERNIA  REPAIR    . History of TURP    . OPEN REDUCTION INTERNAL FIXATION (ORIF) METACARPAL Left 06/22/2014   Procedure: OPEN REDUCTION INTERNAL FIXATION (ORIF) LEFT HAND METACARPAL;  Surgeon: Roseanne Kaufman, MD;  Location: [place];  Service: Orthopedics;  Laterality: Left;  . ORIF ORBITAL FRACTURE Bilateral 06/22/2014   Procedure: OPEN REDUCTION INTERNAL FIXATION (ORIF) BILATERAL LEFORTE1 FRACTURE OF MAXILLA, HYBRID ARCH BARS ;  Surgeon: Izora Gala, MD;  Location: [place];  Service: ENT;  Laterality: Bilateral;  . SHOULDER ARTHROSCOPY WITH ROTATOR CUFF REPAIR Right 01/23/2014   DR Noemi Chapel   . SHOULDER ARTHROSCOPY WITH ROTATOR CUFF REPAIR Right 01/23/2014   Procedure: RIGHT SHOULDER ARTHROSCOPY WITH DEBRIDEMENT AND ROTATOR CUFF REPAIR;  Surgeon: Lorn Junes, MD;  Location: [place];  Service: Orthopedics;  Laterality: Right;    Social History   Social History  . Marital status: Widowed    Spouse name: N/A  . Number of children: N/A  . 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 Smoking status: Former Research scientist (life sciences)  . Smokeless tobacco: Never Used  . Alcohol use 1.2 oz/week    1 Glasses of wine, 1 Shots of liquor per week     Comment: Daily  . Drug use: No  . Sexual activity: Not Currently   Other Topics Concern  . Not on file   Social History Narrative  . No narrative on file    History reviewed. No pertinent family history.  ROS: no fevers or chills, productive cough, hemoptysis, dysphasia, odynophagia, melena, hematochezia, dysuria, hematuria, rash, seizure activity, orthopnea, PND, pedal edema, claudication. Remaining systems are negative.  Physical Exam: Well-developed well-nourished in no acute distress.  Skin is warm and dry.  HEENT is normal.  Neck is supple.  Chest is clear to auscultation with normal expansion.  Cardiovascular exam is regular rate and rhythm. 2/6 systolic murmur left sternal border. Abdominal exam  nontender or distended. No masses palpated. Extremities show no edema. neuro grossly intact  ECG- Sinus bradycardia at a rate of 50. Left anterior fascicular block. Right bundle branch block. personally reviewed  A/P  1 atrial flutter-patient remains in sinus rhythm status post recent cardioversion. Continue amiodarone. He has had a recent liver functions and TSH. Repeat chest x-ray. Continue apixaban. He has inquired about ablation previously but given his age I would like to avoid if possible. We can refer to Dr. Rayann Heman in the future at pts request.  2 coronary artery disease-continue statin. No aspirin given need for anticoagulation.  3 hyperlipidemia-continue statin.  4 aortic valve disease -patient with mild aortic stenosis and mild to moderate aortic insufficiency on most recent echocardiogram. He will need follow-up studies in the  future.  Kirk Ruths, MD

## 2017-04-05 ENCOUNTER — Ambulatory Visit (INDEPENDENT_AMBULATORY_CARE_PROVIDER_SITE_OTHER): Payer: Medicare Other | Admitting: Cardiology

## 2017-04-05 ENCOUNTER — Encounter: Payer: Self-pay | Admitting: Cardiology

## 2017-04-05 VITALS — BP 142/72 | HR 50 | Ht 67.0 in | Wt 188.0 lb

## 2017-04-05 DIAGNOSIS — I350 Nonrheumatic aortic (valve) stenosis: Secondary | ICD-10-CM | POA: Diagnosis not present

## 2017-04-05 DIAGNOSIS — I480 Paroxysmal atrial fibrillation: Secondary | ICD-10-CM

## 2017-04-05 DIAGNOSIS — I2510 Atherosclerotic heart disease of native coronary artery without angina pectoris: Secondary | ICD-10-CM

## 2017-04-05 DIAGNOSIS — E7800 Pure hypercholesterolemia, unspecified: Secondary | ICD-10-CM | POA: Diagnosis not present

## 2017-04-05 NOTE — Patient Instructions (Signed)
Medication Instructions:   NO CHANGE  Testing/Procedures:  A chest x-ray takes a picture of the organs and structures inside the chest, including the heart, lungs, and blood vessels. This test can show several things, including, whether the heart is enlarges; whether fluid is building up in the lungs; and whether pacemaker / defibrillator leads are still in place.   Follow-Up:  Your physician recommends that you schedule a follow-up appointment in: [place]   If you need a refill on your cardiac medications before your next appointment, please call your pharmacy.

## 2017-04-28 ENCOUNTER — Other Ambulatory Visit: Payer: Self-pay | Admitting: Oncology

## 2017-04-28 MED ORDER — CIPROFLOXACIN HCL 250 MG PO TABS: 250.0000 mg | ORAL_TABLET | Freq: Two times a day (BID) | ORAL | 1 refills | Status: AC

## 2017-05-02 ENCOUNTER — Ambulatory Visit (HOSPITAL_COMMUNITY)
Admission: RE | Admit: 2017-05-02 | Discharge: 2017-05-02 | Disposition: A | Discharge status: Home / Self Care | Payer: Medicare Other | Source: Ambulatory Visit | Attending: Oncology | Admitting: Oncology

## 2017-05-02 ENCOUNTER — Other Ambulatory Visit (INDEPENDENT_AMBULATORY_CARE_PROVIDER_SITE_OTHER): Payer: Medicare Other

## 2017-05-02 ENCOUNTER — Other Ambulatory Visit: Payer: Self-pay | Admitting: Oncology

## 2017-05-02 DIAGNOSIS — R319 Hematuria, unspecified: Secondary | ICD-10-CM

## 2017-05-02 DIAGNOSIS — R918 Other nonspecific abnormal finding of lung field: Secondary | ICD-10-CM | POA: Diagnosis not present

## 2017-05-02 DIAGNOSIS — R05 Cough: Secondary | ICD-10-CM | POA: Diagnosis present

## 2017-05-02 DIAGNOSIS — I700 Atherosclerosis of aorta: Secondary | ICD-10-CM | POA: Diagnosis not present

## 2017-05-02 DIAGNOSIS — R053 Chronic cough: Secondary | ICD-10-CM

## 2017-05-02 LAB — RETICULOCYTES
RBC.: 3.41 MIL/uL — ABNORMAL LOW (ref 4.22–5.81)
RETIC CT PCT: 1.2 % (ref 0.4–3.1)
Retic Count, Absolute: 40.9 10*3/uL (ref 19.0–186.0)

## 2017-05-02 LAB — CBC WITH DIFFERENTIAL/PLATELET
Basophils Absolute: 0 10*3/uL (ref 0.0–0.1)
Basophils Relative: 0 %
EOS ABS: 0.6 10*3/uL (ref 0.0–0.7)
EOS PCT: 6 %
HCT: 33.9 % — ABNORMAL LOW (ref 39.0–52.0)
Hemoglobin: 11.3 g/dL — ABNORMAL LOW (ref 13.0–17.0)
LYMPHS ABS: 2.9 10*3/uL (ref 0.7–4.0)
Lymphocytes Relative: 30 %
MCH: 33.1 pg (ref 26.0–34.0)
MCHC: 33.3 g/dL (ref 30.0–36.0)
MCV: 99.4 fL (ref 78.0–100.0)
Monocytes Absolute: 0.9 10*3/uL (ref 0.1–1.0)
Monocytes Relative: 9 %
Neutro Abs: 5.3 10*3/uL (ref 1.7–7.7)
Neutrophils Relative %: 55 %
PLATELETS: 260 10*3/uL (ref 150–400)
RBC: 3.41 MIL/uL — AB (ref 4.22–5.81)
RDW: 13.4 % (ref 11.5–15.5)
WBC: 9.7 10*3/uL (ref 4.0–10.5)

## 2017-05-02 LAB — LACTATE DEHYDROGENASE: LDH: 169 U/L (ref 98–192)

## 2017-05-02 NOTE — Addendum Note (Signed)
Addended by: Truddie Crumble on: 05/02/2017 12:10 PM   Modules accepted: Orders

## 2017-05-02 NOTE — Progress Notes (Signed)
cbc

## 2017-05-19 ENCOUNTER — Other Ambulatory Visit: Payer: Self-pay | Admitting: Oncology

## 2017-05-25 ENCOUNTER — Ambulatory Visit (HOSPITAL_COMMUNITY)
Admission: RE | Admit: 2017-05-25 | Discharge: 2017-05-25 | Disposition: A | Discharge status: Home / Self Care | Payer: Medicare Other | Source: Ambulatory Visit | Attending: Oncology | Admitting: Oncology

## 2017-05-25 ENCOUNTER — Ambulatory Visit (INDEPENDENT_AMBULATORY_CARE_PROVIDER_SITE_OTHER): Payer: Medicare Other | Admitting: Oncology

## 2017-05-25 ENCOUNTER — Other Ambulatory Visit: Payer: Self-pay | Admitting: Oncology

## 2017-05-25 ENCOUNTER — Encounter: Payer: Self-pay | Admitting: Oncology

## 2017-05-25 DIAGNOSIS — I700 Atherosclerosis of aorta: Secondary | ICD-10-CM | POA: Insufficient documentation

## 2017-05-25 DIAGNOSIS — Z881 Allergy status to other antibiotic agents status: Secondary | ICD-10-CM | POA: Diagnosis not present

## 2017-05-25 DIAGNOSIS — Z888 Allergy status to other drugs, medicaments and biological substances status: Secondary | ICD-10-CM

## 2017-05-25 DIAGNOSIS — D472 Monoclonal gammopathy: Secondary | ICD-10-CM

## 2017-05-25 DIAGNOSIS — N183 Chronic kidney disease, stage 3 (moderate): Secondary | ICD-10-CM | POA: Diagnosis not present

## 2017-05-25 DIAGNOSIS — R05 Cough: Secondary | ICD-10-CM | POA: Diagnosis present

## 2017-05-25 DIAGNOSIS — Z880 Allergy status to penicillin: Secondary | ICD-10-CM

## 2017-05-25 DIAGNOSIS — Z7901 Long term (current) use of anticoagulants: Secondary | ICD-10-CM | POA: Diagnosis not present

## 2017-05-25 DIAGNOSIS — Z885 Allergy status to narcotic agent status: Secondary | ICD-10-CM | POA: Diagnosis not present

## 2017-05-25 DIAGNOSIS — J209 Acute bronchitis, unspecified: Secondary | ICD-10-CM

## 2017-05-25 DIAGNOSIS — D61818 Other pancytopenia: Secondary | ICD-10-CM

## 2017-05-25 DIAGNOSIS — Z882 Allergy status to sulfonamides status: Secondary | ICD-10-CM

## 2017-05-25 HISTORY — DX: Acute bronchitis, unspecified: J20.9

## 2017-05-25 LAB — CBC WITH DIFFERENTIAL/PLATELET
Basophils Absolute: 0 K/uL (ref 0.0–0.1)
Basophils Relative: 0 %
Eosinophils Absolute: 0 K/uL (ref 0.0–0.7)
Eosinophils Relative: 1 %
HCT: 31.5 % — ABNORMAL LOW (ref 39.0–52.0)
Hemoglobin: 10.4 g/dL — ABNORMAL LOW (ref 13.0–17.0)
Lymphocytes Relative: 16 %
Lymphs Abs: 0.6 K/uL — ABNORMAL LOW (ref 0.7–4.0)
MCH: 32.9 pg (ref 26.0–34.0)
MCHC: 33 g/dL (ref 30.0–36.0)
MCV: 99.7 fL (ref 78.0–100.0)
Monocytes Absolute: 0.3 K/uL (ref 0.1–1.0)
Monocytes Relative: 9 %
Neutro Abs: 2.5 K/uL (ref 1.7–7.7)
Neutrophils Relative %: 74 %
Platelets: 137 K/uL — ABNORMAL LOW (ref 150–400)
RBC: 3.16 MIL/uL — ABNORMAL LOW (ref 4.22–5.81)
RDW: 14.2 % (ref 11.5–15.5)
WBC: 3.4 K/uL — ABNORMAL LOW (ref 4.0–10.5)

## 2017-05-25 LAB — COMPREHENSIVE METABOLIC PANEL
ALK PHOS: 46 U/L (ref 38–126)
ALT: 28 U/L (ref 17–63)
AST: 27 U/L (ref 15–41)
Albumin: 3.1 g/dL — ABNORMAL LOW (ref 3.5–5.0)
Anion gap: 3 — ABNORMAL LOW (ref 5–15)
BILIRUBIN TOTAL: 0.4 mg/dL (ref 0.3–1.2)
BUN: 17 mg/dL (ref 6–20)
CALCIUM: 8.7 mg/dL — AB (ref 8.9–10.3)
CO2: 25 mmol/L (ref 22–32)
CREATININE: 1.71 mg/dL — AB (ref 0.61–1.24)
Chloride: 104 mmol/L (ref 101–111)
GFR calc non Af Amer: 34 mL/min — ABNORMAL LOW (ref >60)
GFR, EST AFRICAN AMERICAN: 39 mL/min — AB (ref >60)
GLUCOSE: 77 mg/dL (ref 65–99)
Potassium: 4.1 mmol/L (ref 3.5–5.1)
SODIUM: 132 mmol/L — AB (ref 135–145)
TOTAL PROTEIN: 7.5 g/dL (ref 6.5–8.1)

## 2017-05-25 MED ORDER — BENZONATATE 100 MG PO CAPS: 100.0000 mg | ORAL_CAPSULE | Freq: Three times a day (TID) | ORAL | 1 refills | Status: DC | PRN

## 2017-05-25 MED ORDER — METHYLPREDNISOLONE 4 MG PO TBPK: ORAL_TABLET | ORAL | 1 refills | Status: DC

## 2017-05-25 NOTE — Patient Instructions (Signed)
To lab today then to Radiology for a chest X-ray

## 2017-05-25 NOTE — Progress Notes (Signed)
Hematology and Oncology Follow Up Visit  [name] STASZAK, MD [id number] 28-Apr-1925 83 y.o. 05/25/2017 3:08 PM   Principle Diagnosis: Encounter Diagnosis  Name Primary?  . Acute bronchitis treated with antibiotics in the past 60 days      Interim History:   Unscheduled visit for this soon to be 83 year old retired Engineer, civil (consulting) who I follow for a monoclonal gammopathy of undetermined significance, low-grade prostate cancer in remission after a cryoablation procedure. He has additional cardiac issues with persistent atrial fibrillation now status post cardioversion 5. On chronic antiarrhythmic therapy with amiodarone. On chronic anticoagulation with Eloquis 2.5 mg twice daily in view of stage III chronic renal insufficiency. He developed a nonproductive cough without fever back in June. He was initially started on Septra since he was having an evaluation for dysuria and hematuria at that time by his urologist. He had an allergic reaction to the subcutaneous which she had taken in the past without a problem. His face swelled. Generalized malaise and just overall feeling terrible. I changed him to Cipro. Chest x-ray was done on June 18 which I personally reviewed. There was a vague increase in interstitial markings in the lingula which was compatible with a resolving pneumonia. He has had a persistent nonproductive cough. The feeling that something is sticking in his trachea but no dysphagia. No fever or chills. No polymyalgia or polyarthralgia. No GI symptoms. He is starting to get recurrent low-grade dysuria. He is getting weaker. Starting to lose weight. Not eating well because of his cough. Unstable on his feet because of the weakness and at high fall risk. Previous falls have resulted in facial fractures requiring surgery.  Medications: reviewed  Allergies:  Allergies  Allergen Reactions  . Sulfa Antibiotics Swelling  . Chlorhexidine Rash  . Guaifenesin & Derivatives Itching  . Keflex  [Cephalexin] Diarrhea  . Oxycodone Itching and Rash  . Oxycontin [Oxycodone Hcl] Itching and Rash  . Penicillins Other (See Comments)    Caused fungal reaction Has patient had a PCN reaction causing immediate rash, facial/tongue/throat swelling, SOB or lightheadedness with hypotension: No Has patient had a PCN reaction causing severe rash involving mucus membranes or skin necrosis: No Has patient had a PCN reaction that required hospitalization No Has patient had a PCN reaction occurring within the last 10 years: No If all of the above answers are "NO", then may proceed with Cephalosporin use.     Review of Systems: See interim history Remaining ROS negative:   Physical Exam: Blood pressure 107/62, pulse 62, temperature 99 F (37.2 C), temperature source Oral, height 5\' 8"  (1.727 m), weight 185 lb 3.2 oz (84 kg), SpO2 100 %. Wt Readings from Last 3 Encounters:  05/25/17 185 lb 3.2 oz (84 kg)  04/05/17 188 lb (85.3 kg)  03/08/17 192 lb 8 oz (87.3 kg)     General appearance: Elderly Caucasian man in no distress HENNT: Pharynx no erythema, exudate, mass, or ulcer. No thyromegaly or thyroid nodules Lymph nodes: No cervical, supraclavicular, or axillary lymphadenopathy except for chronic 2-3 centimeter node in the left axilla which is unchanged. Breasts:  Lungs: Clear to auscultation, resonant to percussion throughout Heart: Regular rhythm, 2/6 apical systolic murmur and 3/6 systolic murmur in the second right intercostal space unchanged. murmur, no gallop, no rub, no click, no edema Abdomen: Soft, nontender, normal bowel sounds, no mass, no organomegaly Extremities: No edema, no calf tenderness Musculoskeletal: no joint deformities GU:  Vascular: Carotid pulses 2+, no bruits, distal pulses:  Neurologic: Alert, oriented,  PERRLA,  , cranial nerves grossly normal, motor strength 5 over 5, reflexes 1+ symmetric, upper body coordination normal, gait normal, chronic mild intention tremor on  primidone. Skin: No rash or ecchymosis  Lab Results: CBC W/Diff    Component Value Date/Time   WBC 9.7 05/02/2017 1322   RBC 3.41 (L) 05/02/2017 1322   RBC 3.41 (L) 05/02/2017 1322   HGB 11.3 (L) 05/02/2017 1322   HGB 10.5 (L) 02/28/2017 1048   HGB 12.1 (L) 01/14/2014 0804   HCT 33.9 (L) 05/02/2017 1322   HCT 31.2 (L) 02/28/2017 1048   HCT 36.5 (L) 01/14/2014 0804   PLT 260 05/02/2017 1322   PLT 239 02/28/2017 1048   MCV 99.4 05/02/2017 1322   MCV 100 (H) 02/28/2017 1048   MCV 99.8 (H) 01/14/2014 0804   MCH 33.1 05/02/2017 1322   MCHC 33.3 05/02/2017 1322   RDW 13.4 05/02/2017 1322   RDW 14.6 02/28/2017 1048   RDW 13.7 01/14/2014 0804   LYMPHSABS 2.9 05/02/2017 1322   LYMPHSABS 2.1 02/28/2017 1048   LYMPHSABS 2.6 01/14/2014 0804   MONOABS 0.9 05/02/2017 1322   MONOABS 0.7 01/14/2014 0804   EOSABS 0.6 05/02/2017 1322   EOSABS 0.4 02/28/2017 1048   BASOSABS 0.0 05/02/2017 1322   BASOSABS 0.0 02/28/2017 1048   BASOSABS 0.0 01/14/2014 0804     Chemistry      Component Value Date/Time   NA 139 02/24/2017 0814   NA 139 01/31/2017 1048   NA 139 01/14/2014 0804   K 4.2 02/24/2017 0814   K 4.8 01/14/2014 0804   CL 105 02/24/2017 0814   CL 107 03/19/2013 0949   CO2 22 01/31/2017 1048   CO2 25 01/14/2014 0804   BUN 19 02/24/2017 0814   BUN 18 01/31/2017 1048   BUN 16.7 01/14/2014 0804   CREATININE 1.60 (H) 02/24/2017 0814   CREATININE 1.63 (H) 12/09/2015 1215   CREATININE 1.7 (H) 01/14/2014 0804      Component Value Date/Time   CALCIUM 8.8 01/31/2017 1048   CALCIUM 9.4 01/14/2014 0804   ALKPHOS 53 01/31/2017 1048   ALKPHOS 45 01/14/2014 0804   AST 25 01/31/2017 1048   AST 21 01/14/2014 0804   ALT 26 01/31/2017 1048   ALT 20 01/14/2014 0804   BILITOT 0.4 01/31/2017 1048   BILITOT 0.71 01/14/2014 0804       Radiological Studies: Dg Chest 2 View  Result Date: 05/02/2017 CLINICAL DATA:  83 year old male with 2 week history of cough EXAM: CHEST  2 VIEW  COMPARISON:  Prior chest x-ray 06/29/2016 FINDINGS: Cardiac and mediastinal contours remain unchanged. Atherosclerotic calcifications present throughout the transverse aorta. Stable chronic compression fracture of T12 with mild exaggerated thoracic kyphosis. Increased interstitial markings in the lingula compared to prior imaging. Chronic bronchitic changes are otherwise unremarkable. No acute osseous abnormality. IMPRESSION: 1. Slightly increased interstitial markings in the lingula which may reflect bronchopneumonia (in the appropriate clinical setting), atelectasis or scarring. 2.  Aortic Atherosclerosis (ICD10-170.0) Electronically Signed   By: Jacqulynn Cadet M.D.   On: 05/02/2017 14:53    Impression:  Persistent cough. Clear lungs on exam. I believe this is a allergic pneumonitis possibly as a reaction to recent viral pneumonia. Plan: I'm going to a repeated chest x-ray and laboratory studies today including mycoplasma titers. I am going to put him on a Medrol Dosepak. If symptoms do not improve within the next week, then CT scan of the chest.   CC: Patient Care Team: Murriel Hopper  M, MD as PCP - General (Oncology) Ronald Lobo, MD (Gastroenterology) Carolan Clines, MD (Urology) Lelon Perla, MD as Consulting Physician (Cardiology)   Murriel Hopper, MD, [place]  Hematology-Oncology/Internal Medicine     7/11/20183:08 PM

## 2017-05-27 ENCOUNTER — Other Ambulatory Visit: Payer: Self-pay | Admitting: Oncology

## 2017-05-27 LAB — MYCOPLASMA PNEUMONIAE,IGG,IGM: Mycoplasma pneumo IgG: 488 U/mL — ABNORMAL HIGH (ref 0–99)

## 2017-05-27 MED ORDER — AZITHROMYCIN 250 MG PO TABS: ORAL_TABLET | ORAL | 0 refills | Status: DC

## 2017-05-31 ENCOUNTER — Other Ambulatory Visit: Payer: Self-pay | Admitting: Oncology

## 2017-05-31 ENCOUNTER — Other Ambulatory Visit: Payer: Medicare Other

## 2017-05-31 DIAGNOSIS — J209 Acute bronchitis, unspecified: Secondary | ICD-10-CM

## 2017-05-31 DIAGNOSIS — Z7901 Long term (current) use of anticoagulants: Secondary | ICD-10-CM

## 2017-05-31 DIAGNOSIS — R350 Frequency of micturition: Secondary | ICD-10-CM

## 2017-05-31 DIAGNOSIS — G250 Essential tremor: Secondary | ICD-10-CM

## 2017-05-31 DIAGNOSIS — R300 Dysuria: Secondary | ICD-10-CM

## 2017-05-31 DIAGNOSIS — N289 Disorder of kidney and ureter, unspecified: Secondary | ICD-10-CM

## 2017-05-31 DIAGNOSIS — R413 Other amnesia: Secondary | ICD-10-CM

## 2017-05-31 DIAGNOSIS — C61 Malignant neoplasm of prostate: Secondary | ICD-10-CM

## 2017-05-31 DIAGNOSIS — I4819 Other persistent atrial fibrillation: Secondary | ICD-10-CM

## 2017-05-31 DIAGNOSIS — D472 Monoclonal gammopathy: Secondary | ICD-10-CM

## 2017-05-31 DIAGNOSIS — R5381 Other malaise: Secondary | ICD-10-CM

## 2017-05-31 MED ORDER — LEVOFLOXACIN 250 MG PO TABS: 250.0000 mg | ORAL_TABLET | Freq: Every day | ORAL | 0 refills | Status: DC

## 2017-06-01 ENCOUNTER — Other Ambulatory Visit: Payer: Self-pay

## 2017-06-01 ENCOUNTER — Inpatient Hospital Stay: Admission: AD | Admit: 2017-06-01 | Payer: Medicare Other | Source: Ambulatory Visit | Admitting: Internal Medicine

## 2017-06-01 ENCOUNTER — Inpatient Hospital Stay (HOSPITAL_COMMUNITY)
Admission: EM | Admit: 2017-06-01 | Discharge: 2017-06-08 | DRG: 690 | Disposition: A | Discharge status: Skilled Nursing Facility | Payer: Medicare Other | Attending: Internal Medicine | Admitting: Internal Medicine

## 2017-06-01 ENCOUNTER — Emergency Department (HOSPITAL_COMMUNITY): Payer: Medicare Other

## 2017-06-01 ENCOUNTER — Encounter (HOSPITAL_COMMUNITY): Payer: Self-pay | Admitting: Vascular Surgery

## 2017-06-01 ENCOUNTER — Inpatient Hospital Stay (HOSPITAL_COMMUNITY): Payer: Medicare Other

## 2017-06-01 DIAGNOSIS — B952 Enterococcus as the cause of diseases classified elsewhere: Secondary | ICD-10-CM | POA: Diagnosis present

## 2017-06-01 DIAGNOSIS — Z880 Allergy status to penicillin: Secondary | ICD-10-CM | POA: Diagnosis not present

## 2017-06-01 DIAGNOSIS — D472 Monoclonal gammopathy: Secondary | ICD-10-CM | POA: Diagnosis present

## 2017-06-01 DIAGNOSIS — N3090 Cystitis, unspecified without hematuria: Secondary | ICD-10-CM | POA: Diagnosis present

## 2017-06-01 DIAGNOSIS — R109 Unspecified abdominal pain: Secondary | ICD-10-CM | POA: Diagnosis present

## 2017-06-01 DIAGNOSIS — Z8673 Personal history of transient ischemic attack (TIA), and cerebral infarction without residual deficits: Secondary | ICD-10-CM

## 2017-06-01 DIAGNOSIS — Z8546 Personal history of malignant neoplasm of prostate: Secondary | ICD-10-CM

## 2017-06-01 DIAGNOSIS — E440 Moderate protein-calorie malnutrition: Secondary | ICD-10-CM | POA: Diagnosis present

## 2017-06-01 DIAGNOSIS — R4182 Altered mental status, unspecified: Secondary | ICD-10-CM | POA: Diagnosis present

## 2017-06-01 DIAGNOSIS — R296 Repeated falls: Secondary | ICD-10-CM | POA: Diagnosis present

## 2017-06-01 DIAGNOSIS — I2510 Atherosclerotic heart disease of native coronary artery without angina pectoris: Secondary | ICD-10-CM | POA: Diagnosis present

## 2017-06-01 DIAGNOSIS — I4891 Unspecified atrial fibrillation: Secondary | ICD-10-CM | POA: Diagnosis present

## 2017-06-01 DIAGNOSIS — Z7901 Long term (current) use of anticoagulants: Secondary | ICD-10-CM

## 2017-06-01 DIAGNOSIS — R300 Dysuria: Secondary | ICD-10-CM | POA: Diagnosis not present

## 2017-06-01 DIAGNOSIS — R05 Cough: Secondary | ICD-10-CM

## 2017-06-01 DIAGNOSIS — Z87440 Personal history of urinary (tract) infections: Secondary | ICD-10-CM

## 2017-06-01 DIAGNOSIS — R627 Adult failure to thrive: Secondary | ICD-10-CM | POA: Diagnosis present

## 2017-06-01 DIAGNOSIS — N289 Disorder of kidney and ureter, unspecified: Secondary | ICD-10-CM

## 2017-06-01 DIAGNOSIS — Z7982 Long term (current) use of aspirin: Secondary | ICD-10-CM | POA: Diagnosis not present

## 2017-06-01 DIAGNOSIS — E871 Hypo-osmolality and hyponatremia: Secondary | ICD-10-CM | POA: Diagnosis present

## 2017-06-01 DIAGNOSIS — Z87891 Personal history of nicotine dependence: Secondary | ICD-10-CM

## 2017-06-01 DIAGNOSIS — R053 Chronic cough: Secondary | ICD-10-CM

## 2017-06-01 DIAGNOSIS — K219 Gastro-esophageal reflux disease without esophagitis: Secondary | ICD-10-CM | POA: Diagnosis present

## 2017-06-01 DIAGNOSIS — R102 Pelvic and perineal pain: Secondary | ICD-10-CM

## 2017-06-01 DIAGNOSIS — E785 Hyperlipidemia, unspecified: Secondary | ICD-10-CM | POA: Diagnosis present

## 2017-06-01 DIAGNOSIS — N3289 Other specified disorders of bladder: Secondary | ICD-10-CM | POA: Diagnosis present

## 2017-06-01 DIAGNOSIS — L259 Unspecified contact dermatitis, unspecified cause: Secondary | ICD-10-CM | POA: Diagnosis present

## 2017-06-01 DIAGNOSIS — N390 Urinary tract infection, site not specified: Secondary | ICD-10-CM | POA: Diagnosis not present

## 2017-06-01 DIAGNOSIS — Z66 Do not resuscitate: Secondary | ICD-10-CM | POA: Diagnosis present

## 2017-06-01 DIAGNOSIS — R531 Weakness: Secondary | ICD-10-CM

## 2017-06-01 DIAGNOSIS — N183 Chronic kidney disease, stage 3 (moderate): Secondary | ICD-10-CM | POA: Diagnosis present

## 2017-06-01 DIAGNOSIS — Z6827 Body mass index (BMI) 27.0-27.9, adult: Secondary | ICD-10-CM | POA: Diagnosis not present

## 2017-06-01 DIAGNOSIS — Z9079 Acquired absence of other genital organ(s): Secondary | ICD-10-CM | POA: Diagnosis not present

## 2017-06-01 LAB — CBC WITH DIFFERENTIAL/PLATELET
Basophils Absolute: 0.1 10*3/uL (ref 0.0–0.1)
Basophils Relative: 1 %
Eosinophils Absolute: 0 10*3/uL (ref 0.0–0.7)
Eosinophils Relative: 0 %
HCT: 30.1 % — ABNORMAL LOW (ref 39.0–52.0)
Hemoglobin: 10.6 g/dL — ABNORMAL LOW (ref 13.0–17.0)
Lymphocytes Relative: 28 %
Lymphs Abs: 1.5 10*3/uL (ref 0.7–4.0)
MCH: 32.7 pg (ref 26.0–34.0)
MCHC: 35.2 g/dL (ref 30.0–36.0)
MCV: 92.9 fL (ref 78.0–100.0)
Monocytes Absolute: 0.6 10*3/uL (ref 0.1–1.0)
Monocytes Relative: 11 %
Neutro Abs: 3.3 10*3/uL (ref 1.7–7.7)
Neutrophils Relative %: 60 %
Platelets: 103 10*3/uL — ABNORMAL LOW (ref 150–400)
RBC: 3.24 MIL/uL — ABNORMAL LOW (ref 4.22–5.81)
RDW: 14.2 % (ref 11.5–15.5)
WBC: 5.4 10*3/uL (ref 4.0–10.5)

## 2017-06-01 LAB — BASIC METABOLIC PANEL
Anion gap: 5 (ref 5–15)
Anion gap: 4 — ABNORMAL LOW (ref 5–15)
BUN: 32 mg/dL — ABNORMAL HIGH (ref 6–20)
BUN: 32 mg/dL — AB (ref 6–20)
CHLORIDE: 102 mmol/L (ref 101–111)
CO2: 21 mmol/L — ABNORMAL LOW (ref 22–32)
CO2: 21 mmol/L — AB (ref 22–32)
Calcium: 8.5 mg/dL — ABNORMAL LOW (ref 8.9–10.3)
Calcium: 8.4 mg/dL — ABNORMAL LOW (ref 8.9–10.3)
Chloride: 102 mmol/L (ref 101–111)
Creatinine, Ser: 1.5 mg/dL — ABNORMAL HIGH (ref 0.61–1.24)
Creatinine, Ser: 1.56 mg/dL — ABNORMAL HIGH (ref 0.61–1.24)
GFR calc Af Amer: 45 mL/min — ABNORMAL LOW (ref >60)
GFR calc Af Amer: 43 mL/min — ABNORMAL LOW (ref >60)
GFR calc non Af Amer: 39 mL/min — ABNORMAL LOW (ref >60)
GFR calc non Af Amer: 37 mL/min — ABNORMAL LOW (ref >60)
GLUCOSE: 120 mg/dL — AB (ref 65–99)
Glucose, Bld: 97 mg/dL (ref 65–99)
POTASSIUM: 3.8 mmol/L (ref 3.5–5.1)
Potassium: 4.2 mmol/L (ref 3.5–5.1)
Sodium: 128 mmol/L — ABNORMAL LOW (ref 135–145)
Sodium: 127 mmol/L — ABNORMAL LOW (ref 135–145)

## 2017-06-01 LAB — URINALYSIS, ROUTINE W REFLEX MICROSCOPIC
Bilirubin Urine: NEGATIVE
Glucose, UA: NEGATIVE mg/dL
Ketones, ur: NEGATIVE mg/dL
Nitrite: NEGATIVE
Protein, ur: 30 mg/dL — AB
Specific Gravity, Urine: 1.02 (ref 1.005–1.030)
pH: 5 (ref 5.0–8.0)

## 2017-06-01 LAB — MAGNESIUM: Magnesium: 1.8 mg/dL (ref 1.7–2.4)

## 2017-06-01 LAB — OSMOLALITY: Osmolality: 286 mOsm/kg (ref 275–295)

## 2017-06-01 LAB — PHOSPHORUS: Phosphorus: 3 mg/dL (ref 2.5–4.6)

## 2017-06-01 LAB — SODIUM, URINE, RANDOM: Sodium, Ur: 70 mmol/L

## 2017-06-01 LAB — OSMOLALITY, URINE: Osmolality, Ur: 720 mOsm/kg (ref 300–900)

## 2017-06-01 LAB — TSH: TSH: 1.182 u[IU]/mL (ref 0.350–4.500)

## 2017-06-01 MED ORDER — BENZONATATE 100 MG PO CAPS
100.0000 mg | ORAL_CAPSULE | Freq: Three times a day (TID) | ORAL | Status: DC | PRN
  Administered 2017-06-01: 100 mg via ORAL
  Filled 2017-06-01 (×2): qty 1

## 2017-06-01 MED ORDER — LEVOFLOXACIN IN D5W 250 MG/50ML IV SOLN
250.0000 mg | INTRAVENOUS | Status: DC
  Administered 2017-06-01: 250 mg via INTRAVENOUS
  Filled 2017-06-01 (×2): qty 50

## 2017-06-01 MED ORDER — ATORVASTATIN CALCIUM 10 MG PO TABS
10.0000 mg | ORAL_TABLET | Freq: Every day | ORAL | Status: DC
  Administered 2017-06-02 – 2017-06-08 (×7): 10 mg via ORAL
  Filled 2017-06-01 (×7): qty 1

## 2017-06-01 MED ORDER — APIXABAN 2.5 MG PO TABS
2.5000 mg | ORAL_TABLET | Freq: Two times a day (BID) | ORAL | Status: DC
Start: 2017-06-01 — End: 2017-06-08
  Administered 2017-06-01 – 2017-06-08 (×14): 2.5 mg via ORAL
  Filled 2017-06-01 (×14): qty 1

## 2017-06-01 MED ORDER — AMIODARONE HCL 200 MG PO TABS
200.0000 mg | ORAL_TABLET | Freq: Every day | ORAL | Status: DC
  Administered 2017-06-02 – 2017-06-08 (×7): 200 mg via ORAL
  Filled 2017-06-01 (×7): qty 1

## 2017-06-01 MED ORDER — SODIUM CHLORIDE 0.9 % IV SOLN
INTRAVENOUS | Status: AC
  Administered 2017-06-01 – 2017-06-02 (×2): via INTRAVENOUS

## 2017-06-01 MED ORDER — ACETAMINOPHEN 650 MG RE SUPP: 650.0000 mg | Freq: Four times a day (QID) | RECTAL | Status: DC | PRN

## 2017-06-01 MED ORDER — PRIMIDONE 50 MG PO TABS
50.0000 mg | ORAL_TABLET | Freq: Two times a day (BID) | ORAL | Status: DC | PRN
  Administered 2017-06-01 – 2017-06-04 (×5): 50 mg via ORAL
  Filled 2017-06-01 (×5): qty 1

## 2017-06-01 MED ORDER — ACETAMINOPHEN 325 MG PO TABS
650.0000 mg | ORAL_TABLET | Freq: Four times a day (QID) | ORAL | Status: DC | PRN
  Filled 2017-06-01: qty 2

## 2017-06-01 MED ORDER — ASPIRIN EC 81 MG PO TBEC
81.0000 mg | DELAYED_RELEASE_TABLET | ORAL | Status: DC
  Administered 2017-06-03 – 2017-06-08 (×3): 81 mg via ORAL
  Filled 2017-06-01 (×4): qty 1

## 2017-06-01 NOTE — H&P (Signed)
Date: 06/01/2017               Patient Name:  [name] SHIRAISHI, MD MRN: [id number]  DOB: 26-Jan-1925 Age / Sex: 83 y.o., male   PCP: [name] Belt, MD         Medical Service: Internal Medicine Teaching Service         Attending Physician: Dr. Aldine Contes, MD    First Contact: Dr. Tarri [name] Pager: [phone]  Second Contact: Dr. 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 He appears very tired and falls asleep during the conversation. The majority of the history was obtained via his daughter [name] [name].   Approximately 1 month ago he presented to the clinic with cough, hematuria, and dysuria. He was treated for an acute bronchitis and went to his urologist where he was put on Septra. Daughter believes he got a cystography (records not in chart). Hematuria spontaneously resolved. Cough continued and he again presented with dysuria. Treated with Cipro which seemed to help the dysuria.  He was recently given a Medro-dose pack on 05/25/2017. Symptoms temporarily improved. However, on Sunday Dr. Levora [name] began to complain of abdominal pain, dysuria, and generalized weakness. He was given one dose of Levaquin as an outpatient prior to presentation for his dysuria. He is usually very independent however, he seemed confused and unable to hold himself up to go to the restroom. Acknowledges subjective fevers, recurrent falls, weight lose, and decreased oral intake due to decreased appetite.   Meds:  Current Meds  Medication Sig  . amiodarone (PACERONE) 200 MG tablet Take 1 tablet (200 mg total) by mouth daily.  Marland Kitchen aspirin EC 81  MG tablet Take 81 mg by mouth every Monday, Wednesday, and Friday.  Marland Kitchen atorvastatin (LIPITOR) 10 MG tablet TAKE 1 TABLET BY MOUTH ONCE DAILY (Patient taking differently: TAKE 10MG  BY MOUTH ONCE DAILY)  . benzonatate (TESSALON PERLES) 100 MG capsule Take 1 capsule (100 mg total) by mouth every 8 (eight) hours as needed for cough.  Marland Kitchen ELIQUIS 2.5 MG TABS tablet TAKE 1 TABLET BY MOUTH 2 TIMES DAILY (Patient taking differently: TAKE 2.5MG  BY MOUTH 2 TIMES DAILY)  . levofloxacin (LEVAQUIN) 250 MG tablet Take 1 tablet (250 mg total) by mouth daily.  . primidone (MYSOLINE) 50 MG tablet TAKE 1 TABLET BY MOUTH ONCE DAILY FOR 1 WEEK THEN INCREASE TO 2 TABLETS ONCE DAILY IF TOLERATED (Patient taking differently: TAKE 50MG  BY MOUTH TWICE DAILY AS NEEDED FOR TREMORS)   Allergies: Allergies as of 06/01/2017 - Review Complete 06/01/2017  Allergen Reaction Noted  . Sulfa antibiotics Swelling 05/25/2017  . Chlorhexidine Rash 01/23/2014  . Guaifenesin & derivatives Itching 07/24/2012  . Keflex [cephalexin] Diarrhea 03/10/2015  . Oxycodone Itching and Rash 06/08/2014  . Oxycontin [oxycodone hcl] Itching and Rash 06/08/2014  . Penicillins Other (See Comments) 07/15/2010   Past Medical History:  Diagnosis Date  . Acute bronchitis treated with antibiotics in the past 60 days 05/25/2017   Persistent cough despite two courses of antibiotics 05/25/17  . Anemia   . Aortic insufficiency    Mild,  echo, July, 2011  . Aortic stenosis    Mild, echo, July, 2011  . Atrial fibrillation [facility]) 2011, 2015   Multaq Started September, 2011, dose adjusted October, 2011, 200 mg a.m., 400 mg p.o.  . Bradycardia    Sinus bradycardia while on 25 Lopressor twice a day October, 2013  . Carotid artery disease ([place])    Doppler, 2008 no significant abnormality  . Cervical spine disease   . Chronic anticoagulation 01/27/2015   pradaxa 75 mg BID for A fib  . Coronary artery disease    minimal, cath 2005 / catheterization August, 2011,  30% in 3 vessels, normal LV function  . Diarrhea    Chronic  . Drug therapy    Pradaxa Started July, 2011  . Ejection fraction    EF 60%, echo, 2009  /  TEE normal August, 2011 /   EF 60%, echo, July, 2011  . GERD (gastroesophageal reflux disease)    Esophageal dilatation in 1992, some esophageal spasm  . Hyperlipidemia   . Incomplete right bundle branch block    August, 2012  . Leg fatigue    June, 2014  . Memory change    June, 2014  . Monoclonal gammopathy    granfortuna  . MR (mitral regurgitation) 2009   mild, Echo, July, 2011  . Painless hematuria 03/27/2012   Occurred x 2 5/12 & 5/13 on Pradaxa 75 mg BID  . Prostate cancer ([place]) 11/22/2011  . Renal insufficiency    Prior creatinine 1.2 /  September, 2011.. 1.7  /  October, 2011.. 1.8  . S/P transurethral resection of prostate 2004   2004  . TIA (transient ischemic attack)    Dr. Erling Cruz,, question in the past. when off ASA for 10 days   Family History:  Mother and father both deceased.   Social History:  Former smoker Denies illicit drug use  Endorse EtOH use   Review of Systems: A complete ROS was negative except as per HPI.   Physical Exam: Blood pressure 122/64, pulse 67, temperature 98.3 F (36.8 C), temperature source Oral, resp. rate 14, SpO2 97 %.   Physical Exam  Constitutional:  Elderly male, tired appearing  HENT:  Head: Normocephalic and atraumatic.  Eyes: Pupils are equal, round, and reactive to light. Conjunctivae are normal.  Cardiovascular: Normal rate, regular rhythm, normal heart sounds and intact distal pulses.   Pulmonary/Chest: Effort normal and breath sounds normal. He has no wheezes. He has no rales.  Abdominal: Soft. Bowel sounds are normal. There is tenderness (Suprapubic and Epigastric).  Musculoskeletal: He exhibits no edema.  Neurological:  Generalize weakness in upper and lower extremities bilaterally. Hard to assess as Dr. Levora [name] falls asleep during exam.   Skin: He is  diaphoretic.   EKG: personally reviewed; my interpretation is sinus rhythm, indeterminate axis deviation with RBBB (unchanged from prior ECG).  CXR: personally reviewed; my interpretation is rotated image but not apparent infiltrate with no pleural effusions.   Assessment & Plan by Problem: Principal Problem:   Altered mental status Active Problems:   Renal insufficiency   Failure to thrive in adult   Dysuria   Abdominal pain  1. Altered Mental Status - SIRS 1, Q-Sofa 1  - No overt electrolyte derangements, no hypoxia, medications reviewed - Possible infection, endorses dysuria and suprapubic tenderness to palpation, will continue Levaquin  - Failure to improve will consider brain imaging  2. Dysuria and abdominal pain  - Suprapubic tenderness to palpation  - UA rare  bacteria with trace leukocytes and negative nitrites. PH 5.0 - One dose of Levaquin yesterday, will continue - Will get an renal ultrasound   3. Weakness - Checking Mg, PO4, TSH  - Decreased oral intake  - PT/OT consult   4. Hyponatremia  - Checking serum osmolality, urine sodium and urine osmolality  - Reports decreased oral intake, likely hypotonic hypovolemic hyponatremia  - IV NS 22mL/hr  5. Failure to thrive  - Previous albumin low - Nutrition consult  6. MGUS - Pancytopenia on recent CBC  - Leukopenia resolved  - Checking light chains in the AM  7. Chronic Cough  - > 30 days  - No hypoxic, possible amiodarone toxicity   8. A-Fib  - Currently in sinus  - Continue amiodarone and Eliquis   Dispo: Admit patient to Inpatient with expected length of stay greater than 2 midnights.  Signed: Ina Homes, MD 06/01/2017, 7:00 PM  My Pager: [phone]

## 2017-06-01 NOTE — Progress Notes (Signed)
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  Pharmacy has been consulted for levaquin dosing. Pt is afebrile and WBC is WNL. Scr is elevated at 1.5 but appears to be patients baseline.   Plan: Levaquin 250mg  IV Q24H F/u renal fxn, C&S, clinical status and LOT     Temp (24hrs), Avg:98.3 F (36.8 C), Min:98.3 F (36.8 C), Max:98.3 F (36.8 C)   Recent Labs Lab 06/01/17 1530  WBC 5.4  CREATININE 1.50*    Estimated Creatinine Clearance: 34.5 mL/min (A) (by C-G formula based on SCr of 1.5 mg/dL (H)).    Allergies  Allergen Reactions  . Sulfa Antibiotics Swelling  . Chlorhexidine Rash  . Guaifenesin & Derivatives Itching  . Keflex [Cephalexin] Diarrhea  . Oxycodone Itching and Rash  . Oxycontin [Oxycodone Hcl] Itching and Rash  . Penicillins Other (See Comments)    Caused fungal reaction Has patient had a PCN reaction causing immediate rash, facial/tongue/throat swelling, SOB or lightheadedness with hypotension: No Has patient had a PCN reaction causing severe rash involving mucus membranes or skin necrosis: No Has patient had a PCN reaction that required hospitalization No Has patient had a PCN reaction occurring within the last 10 years: No If all of the above answers are "NO", then may proceed with Cephalosporin use.     Antimicrobials this admission: Levaquin 7/18>>  Dose adjustments this admission: N/A  Microbiology results: Pending  Thank you for allowing pharmacy to be a part of this patient's care.  [name] [name], [name] [name] 06/01/2017 6:34 PM

## 2017-06-01 NOTE — ED Triage Notes (Signed)
Pt reports to the ED for eval of urinary retention, fevers, and FTT. He is from home and arrives via [facility]. Per family patient is more lethargic today than normal. Pt was recently tx for bronchitis and has been declining since. He has had a cough x several days with a fever as well as urinary retention. He is to be admitted but does not have a bed upstair. He has been taking medication for bronchitis but has not had full relief in symptoms. Pt also fell yesterday. Family denies any head injury or LOC. VSS en route.

## 2017-06-02 DIAGNOSIS — R300 Dysuria: Secondary | ICD-10-CM

## 2017-06-02 DIAGNOSIS — R4182 Altered mental status, unspecified: Secondary | ICD-10-CM

## 2017-06-02 DIAGNOSIS — N183 Chronic kidney disease, stage 3 (moderate): Secondary | ICD-10-CM

## 2017-06-02 LAB — COMPREHENSIVE METABOLIC PANEL
ALT: 175 U/L — ABNORMAL HIGH (ref 17–63)
AST: 180 U/L — ABNORMAL HIGH (ref 15–41)
Albumin: 2.1 g/dL — ABNORMAL LOW (ref 3.5–5.0)
Alkaline Phosphatase: 49 U/L (ref 38–126)
Anion gap: 4 — ABNORMAL LOW (ref 5–15)
BUN: 32 mg/dL — ABNORMAL HIGH (ref 6–20)
CO2: 23 mmol/L (ref 22–32)
Calcium: 8.2 mg/dL — ABNORMAL LOW (ref 8.9–10.3)
Chloride: 104 mmol/L (ref 101–111)
Creatinine, Ser: 1.54 mg/dL — ABNORMAL HIGH (ref 0.61–1.24)
GFR calc Af Amer: 44 mL/min — ABNORMAL LOW (ref >60)
GFR calc non Af Amer: 38 mL/min — ABNORMAL LOW (ref >60)
Glucose, Bld: 114 mg/dL — ABNORMAL HIGH (ref 65–99)
Potassium: 4.1 mmol/L (ref 3.5–5.1)
Sodium: 131 mmol/L — ABNORMAL LOW (ref 135–145)
Total Bilirubin: 0.9 mg/dL (ref 0.3–1.2)
Total Protein: 6.1 g/dL — ABNORMAL LOW (ref 6.5–8.1)

## 2017-06-02 LAB — CBC
HCT: 28.6 % — ABNORMAL LOW (ref 39.0–52.0)
Hemoglobin: 10 g/dL — ABNORMAL LOW (ref 13.0–17.0)
MCH: 32.8 pg (ref 26.0–34.0)
MCHC: 35 g/dL (ref 30.0–36.0)
MCV: 93.8 fL (ref 78.0–100.0)
Platelets: 98 10*3/uL — ABNORMAL LOW (ref 150–400)
RBC: 3.05 MIL/uL — ABNORMAL LOW (ref 4.22–5.81)
RDW: 14.8 % (ref 11.5–15.5)
WBC: 5.7 10*3/uL (ref 4.0–10.5)

## 2017-06-02 MED ORDER — DEXTROSE 5 % IV SOLN
1.0000 g | INTRAVENOUS | Status: DC
  Administered 2017-06-02 – 2017-06-03 (×2): 1 g via INTRAVENOUS
  Filled 2017-06-02 (×3): qty 10

## 2017-06-02 MED ORDER — ENSURE ENLIVE PO LIQD
237.0000 mL | Freq: Two times a day (BID) | ORAL | Status: DC
  Administered 2017-06-02 – 2017-06-08 (×12): 237 mL via ORAL

## 2017-06-02 NOTE — Progress Notes (Signed)
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   Plan: -Rocephin 1gm IV q24h -Will sign off. Please contact pharmacy with any other needs.  Thank you, Hildred Laser, Pharm D 06/02/2017 2:44 PM   Height: 5\' 8"  (172.7 cm) Weight: 180 lb (81.6 kg) IBW/kg (Calculated) : 68.4  Temp (24hrs), Avg:99 F (37.2 C), Min:98.3 F (36.8 C), Max:100.1 F (37.8 C)   Recent Labs Lab 06/01/17 1530 06/01/17 2208 06/02/17 0338  WBC 5.4  --  5.7  CREATININE 1.50* 1.56* 1.54*    Estimated Creatinine Clearance: 30.8 mL/min (A) (by C-G formula based on SCr of 1.54 mg/dL (H)).    Allergies  Allergen Reactions  . Sulfa Antibiotics Swelling  . Chlorhexidine Rash  . Guaifenesin & Derivatives Itching  . Keflex [Cephalexin] Diarrhea  . Oxycodone Itching and Rash  . Oxycontin [Oxycodone Hcl] Itching and Rash  . Penicillins Other (See Comments)    Caused fungal reaction Has patient had a PCN reaction causing immediate rash, facial/tongue/throat swelling, SOB or lightheadedness with hypotension: No Has patient had a PCN reaction causing severe rash involving mucus membranes or skin necrosis: No Has patient had a PCN reaction that required hospitalization No Has patient had a PCN reaction occurring within the last 10 years: No If all of the above answers are "NO", then may proceed with Cephalosporin use.     Antimicrobials this admission: Levaquin 7/18>> 7/19 Rocephin 7/19>>  Dose adjustments this admission: N/A  Microbiology results: 7/18 urine  Thank you for allowing pharmacy to be a part of this patient's care.  Hildred Laser, Pharm D 06/02/2017 2:43 PM

## 2017-06-02 NOTE — Progress Notes (Signed)
Physical Therapy Evaluation Patient Details Name: FOUNT FUGERE, MD MRN: [id number] DOB: 02-19-1925 Today's Date: 06/02/2017   History of Present Illness  Patient is a 83 y/o male who presents with progressive weakness and dysuria that has acutely worsened over the last 4 days. Concern for cystitis. Renal US illustrated left perirenal fluid and a 9mm cyst on the right kidney. PMH includes CKD stage III, MGUS, Prostate cancer, TIA, HLD, CAD, A-fib, aortic stenosis, MR.   Clinical Impression  Patient presents with generalized weakness, delayed processing, HOH, impaired sitting balance and decreased mobility s/p above. Tolerated sitting EOB ~15 mins with back support due to heavy posterior lean. Tolerated transfer to chair using stedy with assist of 2-3. Recommend using maxi move to get pt back to bed. Pt fatigues very quickly and has limited activity tolerance.  Pt has been having a progressive decline at home. Would benefit from [facility] SNF to maximize independence and mobility prior to return home. Will follow acutely.    Follow Up Recommendations SNF;Supervision for mobility/OOB;Supervision/Assistance - 24 hour    Equipment Recommendations  None recommended by PT    Recommendations for Other Services       Precautions / Restrictions Precautions Precautions: Fall Restrictions Weight Bearing Restrictions: No      Mobility  Bed Mobility Overal bed mobility: Needs Assistance Bed Mobility: Supine to Sit     Supine to sit: Max assist;+2 for physical assistance;HOB elevated     General bed mobility comments: Assist to bring LEs to EOB, scoot bottom and elevate trunk; heavy posterior lean.  Transfers Overall transfer level: Needs assistance Equipment used: Ambulation equipment used Transfers: Sit to/from Stand Sit to Stand: Max assist;+2 physical assistance;From elevated surface         General transfer comment: Assist of 2 to power to standing using stedy with help for hip  extension, performed x2.  Ambulation/Gait             General Gait Details: Deferred  Stairs            Wheelchair Mobility    Modified Rankin (Stroke Patients Only)       Balance Overall balance assessment: Needs assistance;History of Falls Sitting-balance support: Feet supported;Bilateral upper extremity supported Sitting balance-Leahy Scale: Zero Sitting balance - Comments: Pt with posterior lean supported by therapist, Able to activate core and initiate forward flexion but quickly fatigues. Able to sit with UE support on RW for a few mins. Postural control: Posterior lean Standing balance support: During functional activity Standing balance-Leahy Scale: Zero Standing balance comment: Able to stand with assist of 2 in stedy.                             Pertinent Vitals/Pain Pain Assessment: Faces Faces Pain Scale: No hurt    Home Living Family/patient expects to be discharged to:: Private residence Living Arrangements: Alone Available Help at Discharge: Personal care attendant Type of Home: House Home Access: Stairs to enter Entrance Stairs-Rails: Left Entrance Stairs-Number of Steps: 4 Home Layout: One level Home Equipment: Walker - 2 wheels;Cane - single point;Bedside commode;Shower seat - built in;Hand held Sales executive      Prior Function Level of Independence: Needs assistance   Gait / Transfers Assistance Needed: Uses RW for ambulation.   ADL's / Homemaking Assistance Needed: Assist with ADLs/IADLs from aide and daughter.        Hand Dominance   Dominant Hand: Right  Extremity/Trunk Assessment   Upper Extremity Assessment Upper Extremity Assessment: Defer to OT evaluation    Lower Extremity Assessment Lower Extremity Assessment: Generalized weakness (Grossly ~2+-3/5 throughout BLEs)    Cervical / Trunk Assessment Cervical / Trunk Assessment: Kyphotic  Communication   Communication: HOH  Cognition  Arousal/Alertness: Awake/alert Behavior During Therapy: WFL for tasks assessed/performed Overall Cognitive Status: Impaired/Different from baseline Area of Impairment: Problem solving;Following commands                       Following Commands: Follows one step commands with increased time     Problem Solving: Slow processing;Requires verbal cues;Requires tactile cues General Comments: Slow to respond to questions and commands.       General Comments General comments (skin integrity, edema, etc.): Daughter, Hollie Salk and aide Seward Grater present during session to assist.     Exercises General Exercises - Lower Extremity Long Arc Quad: Both;5 reps;Seated;AAROM   Assessment/Plan    PT Assessment Patient needs continued PT services  PT Problem List Decreased strength;Decreased mobility;Decreased balance;Decreased activity tolerance;Decreased cognition       PT Treatment Interventions Therapeutic activities;Gait training;Therapeutic exercise;Patient/family education;Balance training;Functional mobility training    PT Goals (Current goals can be found in the Care Plan section)  Acute Rehab PT Goals Patient Stated Goal: none stated PT Goal Formulation: With patient Time For Goal Achievement: 06/16/17 Potential to Achieve Goals: Fair    Frequency Min 2X/week   Barriers to discharge Decreased caregiver support      Co-evaluation PT/OT/SLP Co-Evaluation/Treatment: Yes Reason for Co-Treatment: For patient/therapist safety;To address functional/ADL transfers PT goals addressed during session: Mobility/safety with mobility;Balance;Strengthening/ROM         AM-PAC PT "6 Clicks" Daily Activity  Outcome Measure Difficulty turning over in bed (including adjusting bedclothes, sheets and blankets)?: Total Difficulty moving from lying on back to sitting on the side of the bed? : Total Difficulty sitting down on and standing up from a chair with arms (e.g., wheelchair, bedside  commode, etc,.)?: Total Help needed moving to and from a bed to chair (including a wheelchair)?: Total Help needed walking in hospital room?: Total Help needed climbing 3-5 steps with a railing? : Total 6 Click Score: 6    End of Session Equipment Utilized During Treatment: Gait belt Activity Tolerance: Patient limited by fatigue Patient left: in chair;with call bell/phone within reach;with family/visitor present;with chair alarm set Nurse Communication: Mobility status;Need for lift equipment PT Visit Diagnosis: Muscle weakness (generalized) (M62.81);Unsteadiness on feet (R26.81);Difficulty in walking, not elsewhere classified (R26.2)    Time: 7829-5621 PT Time Calculation (min) (ACUTE ONLY): 40 min   Charges:   PT Evaluation $PT Eval Moderate Complexity: 1 Procedure     PT G Codes:        Mylo Red, PT, DPT [phone]    Blake Divine A Doyce Stonehouse 06/02/2017, 1:28 PM

## 2017-06-02 NOTE — Progress Notes (Signed)
Received from ED alert and oriented to self.Vital signs WNL.. Daughter and caregiver at bedside.

## 2017-06-02 NOTE — Progress Notes (Signed)
Initial Nutrition Assessment  DOCUMENTATION CODES:   Non-severe (moderate) malnutrition in context of chronic illness  INTERVENTION:   Ensure Enlive po BID, each supplement provides 350 kcal and 20 grams of protein  NUTRITION DIAGNOSIS:   Malnutrition (Moderate) related to chronic illness (CKD stage 3) as evidenced by moderate depletion of body fat, moderate depletions of muscle mass.  GOAL:   Patient will meet greater than or equal to 90% of their needs   MONITOR:   PO intake, Supplement acceptance, Weight trends  REASON FOR ASSESSMENT:   Consult Assessment of nutrition requirement/status  ASSESSMENT:   Pt with PMH of CKD stage 3, Prostate Ca, CAD, HTN. Presents this admission with AMS, dysuria, and FTT.   Pt very lethargic upon assessment. Daughter at bedside to answer questions regarding nutrition status. She reports pt has had a great appetite until a week ago due to increased coughing and lack of sleep. States pt eats a good amount of protein and stresses the importance of it in the pt's diet.   Daughter reports pt's UBW is around 185 lbs. Records indicate pt weighed 192 lbs in April 2018, a 12 lb wt change from this admission wt of 180 lb (6% in 3 months). This percentage in this time frame is not significant for malnutrition.   However, a Nutrition-Focused physical exam was completed. Findings are severe fat depletion in the upper arm region, moderate fat depletion in thoracic/lumbar regions, severe muscle depletion in the clavicle, moderate muscle depletion in the bilateral lower and upper extremties, and no edema. Since pt does not meet criteria for wt loss or energy intake and only has two severe depletion sites, this pt meets criteria for moderate malnutrition in the context of chronic illness.   Medications reviewed and include: NS @ 75 ml/hr Labs reviewed: Na 131 (H), CBG 77-120  Diet Order:  Diet Heart Room service appropriate? Yes; Fluid consistency:  Thin  Skin:   (open wound left hand)  Last BM:  06/01/17  Height:   Ht Readings from Last 1 Encounters:  06/01/17 5\' 8"  (1.727 m)    Weight:   Wt Readings from Last 1 Encounters:  06/01/17 180 lb (81.6 kg)    Ideal Body Weight:  70 kg  BMI:  Body mass index is 27.37 kg/m.  Estimated Nutritional Needs:   Kcal:  2000-2200 (28- 31 kcal/kg IBW)  Protein:  110-120 grams (1.5 -1.7 g/kg IBW)  Fluid:  > 2L/day  EDUCATION NEEDS:   No education needs identified at this time  [place], LDN Pager # - [phone]

## 2017-06-02 NOTE — Progress Notes (Addendum)
  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 In brief, patient is a 83 year old male with past medical history of CAD stage III, MGUS, prostate cancer who presented to the ED with progressive weakness and dysuria over the last 4 days.  Over the last month patient has had a persistent cough which was initially thought to be secondary to an acute bronchitis. He then developed an episode of hematuria with associated dysuria and followed up with his urologist and was treated for a possible UTI with Septra. He also had a cystoscopy at this time which was normal per resident discussion with his daughter. Patient initially improved but then had recurrent symptoms of dysuria and was treated with Cipro which appeared to resolve the symptoms. She was recently started on a Medrol Dosepak on 05/25/2017 for persistent bronchitis which temporarily improved his symptoms. Approximate 4 days prior to admission he developed recurrent abdominal pain which is suprapubic in nature with associated dysuria and generalized weakness. He was given 1 dose of Levaquin as an outpatient for presented to the ED given his persistent weakness. He also developed confusion on the day of admission according to his daughter. Patient also had subjective fevers and chills as well as recurrent falls at home and poor by mouth intake.  Today patient appears oriented but still with extreme weakness. He states his dysuria has resolved and that his abdominal pain has improved as well.  PMHx, Fam Hx, and/or Soc Hx : As per resident admit note  Vitals:   06/02/17 0645 06/02/17 1101  BP: (!) 136/57 (!) 139/59  Pulse: (!) 57 64  Resp: 17 18  Temp: 98.7 F (37.1 C) 99.5 F (37.5 C)   Gen.: Awake alert and oriented 3, NAD CVS: Regular rate and rhythm, systolic murmur  noted Lungs: CTA bilaterally Abdomen: Soft, nontender, nondistended, normoactive bowel sounds Extremities: No edema noted  Assessment and Plan: I have seen and evaluated the patient as outlined above. I agree with the formulated Assessment and Plan as detailed in the residents' note, with the following changes:   1. Likely complicated UTI/pyelonephritis: - Patient presented to the ED with worsening weakness, dysuria, suprapubic abdominal pain consistent with recurrent urinary tract infection. He did have a renal sono done which showed some left perirenal fluid consistent with inflammation which is concerning for possible pyelonephritis. - Discussed case with patient's primary care physician. We will transition patient to IV ceftriaxone at this time and DC Levaquin given patient's age and recent treatment with Cipro as an outpatient. - We'll follow-up urine culture - Patient does have a history of prostate cancer and has difficulty urinating. We will continue with his Foley catheter for now and have him follow-up with his urologist as an outpatient. - His mental status appears stated improved from admission but still not back to baseline. This is likely secondary to his underlying infection. - PT recommending SNF placement once patient is stable for discharge  Aldine Contes, MD 7/19/20182:04 PM

## 2017-06-02 NOTE — Evaluation (Signed)
Occupational Therapy Evaluation Patient Details Name: [name] POPOFF, MD MRN: [id number] DOB: [birth year] Today's Date: 06/02/2017    History of Present Illness Patient is a 83 y/o male who presents with progressive weakness and dysuria that has acutely worsened over the last 4 days. Concern for cystitis. Renal US illustrated left perirenal fluid and a 19mm cyst on the right kidney. PMH includes CKD stage III, MGUS, Prostate cancer, TIA, HLD, CAD, A-fib, aortic stenosis, MR.    Clinical Impression   PTA Pt independent in mobility, supervision for bathing but otherwise independent in ADL. Pt has a personal care attendant (who is also a CNA that assists with IADL). Pt is currently max A for all ADL and max +2 with strong posterior lean for transfers. Therapy recommended RN staff use maximove to facilitate transfer back to bed from recliner. Patient presents with generalized weakness, delayed processing, HOH, impaired sitting balance which decrease Pt's ability to perform ADL independently. Please see full OT problem list below. Pt will require skilled OT in the acute setting and SNF level therapy upon dc to return to PLOF. Next session to focus on seated ADL as Pt was too fatigued after transfer to participate.     Follow Up Recommendations  SNF;Supervision/Assistance - 24 hour    Equipment Recommendations  Other (comment) (defer to next venue of care)    Recommendations for Other Services       Precautions / Restrictions Precautions Precautions: Fall Restrictions Weight Bearing Restrictions: No      Mobility Bed Mobility Overal bed mobility: Needs Assistance Bed Mobility: Supine to Sit     Supine to sit: Max assist;+2 for physical assistance;HOB elevated     General bed mobility comments: Assist to bring LEs to EOB, scoot bottom and elevate trunk; heavy posterior lean.  Transfers Overall transfer level: Needs assistance Equipment used: Ambulation equipment used Transfers:  Sit to/from Stand Sit to Stand: Max assist;+2 physical assistance;From elevated surface         General transfer comment: Assist of 2 to power to standing using stedy with help for hip extension, performed x2.    Balance Overall balance assessment: Needs assistance;History of Falls Sitting-balance support: Feet supported;Bilateral upper extremity supported Sitting balance-Leahy Scale: Zero Sitting balance - Comments: Pt with posterior lean supported by therapist, Able to activate core and initiate forward flexion but quickly fatigues. Able to sit with UE support on RW for a few mins. Postural control: Posterior lean Standing balance support: During functional activity Standing balance-Leahy Scale: Zero Standing balance comment: Able to stand with assist of 2 in stedy.                           ADL either performed or assessed with clinical judgement   ADL Overall ADL's : Needs assistance/impaired                                       General ADL Comments: Pt is max assist for all ADL at the present time.      Vision Baseline Vision/History: Wears glasses Wears Glasses: Reading only Patient Visual Report: No change from baseline       Perception     Praxis      Pertinent Vitals/Pain Pain Assessment: Faces Faces Pain Scale: No hurt     Hand Dominance Right   Extremity/Trunk Assessment Upper Extremity Assessment Upper  Extremity Assessment: Generalized weakness   Lower Extremity Assessment Lower Extremity Assessment: Defer to PT evaluation   Cervical / Trunk Assessment Cervical / Trunk Assessment: Kyphotic   Communication Communication Communication: HOH   Cognition Arousal/Alertness: Awake/alert Behavior During Therapy: WFL for tasks assessed/performed Overall Cognitive Status: Impaired/Different from baseline Area of Impairment: Problem solving;Following commands                       Following Commands: Follows one  step commands with increased time     Problem Solving: Slow processing;Requires verbal cues;Requires tactile cues General Comments: Slow to respond to questions and commands.    General Comments  Pt's daughter and caregiver present during session    Exercises General Exercises - Lower Extremity Long Arc Quad: Both;5 reps;[facility]   Shoulder Instructions      Home Living Family/patient expects to be discharged to:: Private residence Living Arrangements: Alone Available Help at Discharge: Personal care attendant Type of Home: House Home Access: Stairs to enter [employer] of Steps: 4 Entrance Stairs-Rails: Left Home Layout: One level     Bathroom Shower/Tub: Occupational psychologist: Standard Bathroom Accessibility: Yes How Accessible: Accessible via walker Home Equipment: [place] - 2 wheels;Cane - single point;Bedside commode;Shower seat - built in;Hand held Advertising copywriter          Prior Functioning/Environment Level of Independence: Needs assistance  Gait / Transfers Assistance Needed: Uses RW for ambulation.  ADL's / Homemaking Assistance Needed: Assist with ADLs/IADLs from aide and daughter.            OT Problem List: Decreased strength;Decreased range of motion;Decreased activity tolerance;Impaired balance (sitting and/or standing);Decreased cognition;Decreased safety awareness;Decreased knowledge of use of DME or AE      OT Treatment/Interventions: Self-care/ADL training;Therapeutic exercise;Energy conservation;DME and/or AE instruction;Therapeutic activities;Cognitive remediation/compensation;Patient/family education;Balance training    OT Goals(Current goals can be found in the care plan section) Acute Rehab OT Goals Patient Stated Goal: none stated ADL Goals Pt Will Perform Eating: with set-up;sitting Pt Will Perform Grooming: with set-up;sitting;with caregiver independent in assisting Pt Will Perform Upper Body  Bathing: with min assist;sitting;with caregiver independent in assisting Pt Will Perform Lower Body Bathing: with mod assist;sitting/lateral leans;with adaptive equipment;with min guard assist Pt Will Transfer to Toilet: with min assist;stand pivot transfer;bedside commode (with RW; caregiver independent in assisting) Pt Will Perform Toileting - Clothing Manipulation and hygiene: with mod assist;sit to/from stand;with caregiver independent in assisting Additional ADL Goal #1: Pt will perform bed mobility at min A level prior to initiating ADL  OT Frequency: Min 2X/week   Barriers to D/C: Decreased caregiver support  Pt lives alone and only has caregiver from 1-6 5 days a week       Co-evaluation PT/OT/SLP Co-Evaluation/Treatment: Yes Reason for Co-Treatment: For patient/therapist safety;To address functional/ADL transfers PT goals addressed during session: Mobility/safety with mobility;Balance;Strengthening/ROM OT goals addressed during session: ADL's and self-care      AM-PAC PT "6 Clicks" Daily Activity     Outcome Measure Help from another person eating meals?: A Lot Help from another person taking care of personal grooming?: A Lot Help from another person toileting, which includes using toliet, bedpan, or urinal?: Total Help from another person bathing (including washing, rinsing, drying)?: Total Help from another person to put on and taking off regular upper body clothing?: Total Help from another person to put on and taking off regular lower body clothing?: Total 6 Click Score: 8   End of Session  Equipment Utilized During Treatment: Teacher, adult education Communication: Mobility status;Need for lift equipment  Activity Tolerance: Patient limited by fatigue Patient left: in chair;with call bell/phone within reach;with chair alarm set;with family/visitor present  OT Visit Diagnosis: Unsteadiness on feet (R26.81);Other abnormalities of gait and mobility (R26.89);Repeated falls  (R29.6);Muscle weakness (generalized) (M62.81);History of falling (Z91.81);Other symptoms and signs involving cognitive function                Time: 1281-1886 OT Time Calculation (min): 41 min Charges:  OT General Charges $OT Visit: 1 Procedure OT Evaluation $OT Eval Moderate Complexity: 1 Procedure OT Treatments $Self Care/Home Management : 8-22 mins G-Codes:     Hulda Humphrey OTR/L [place] 06/02/2017, 4:57 PM

## 2017-06-02 NOTE — Progress Notes (Signed)
   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 More alert this AM. Still seems to be experiencing some cognitive delay. Discussed the treatment plan including antibiotics for likely cystitis, IV hydration, and PT/OT evaluation. Agreeable with the plan. Discussed his code status, he elected to be DNR.   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 Discussed the plan and will awaiting PT/OT eval and recommendations.   Objective: Vital signs in last 24 hours: Vitals:   06/01/17 2040 06/02/17 0203 06/02/17 0645 06/02/17 1101  BP: (!) 114/55 (!) 117/54 (!) 136/57 (!) 139/59  Pulse: 73 (!) 59 (!) 57 64  Resp: 17 16 17 18   Temp: 98.6 F (37 C) 100.1 F (37.8 C) 98.7 F (37.1 C) 99.5 F (37.5 C)  TempSrc: Oral Axillary  Oral  SpO2: 93% 94% 97% 98%  Weight: 180 lb (81.6 kg)     Height: 5\' 8"  (1.727 m)      Physical Exam  Constitutional:  Deconditioned appearing  HENT:  Head: Normocephalic and atraumatic.  Eyes: Pupils are equal, round, and reactive to light. Conjunctivae are normal.  Cardiovascular: Normal rate, regular rhythm, normal heart sounds and intact distal pulses.   Pulmonary/Chest: Effort normal.  Hard to assess breath sounds do to trouble moving and frequent cough  Abdominal: Soft. Bowel sounds are normal. He exhibits no distension. There is no tenderness.  Musculoskeletal: He exhibits no edema.  Neurological: He is alert.  Skin: Skin is warm. He is diaphoretic.   Assessment/Plan: Principal Problem:   Altered mental status Active Problems:   Renal insufficiency   Failure to thrive in adult   Dysuria   Abdominal pain  1. Altered Mental Status - No overt electrolyte derangements, no hypoxia, medications reviewed - Likely infectious cause. Cystitis. Will get urine cultures and continue Levaquin  - Improved over the interval; however, still not mentally back to baseline   2. Dysuria and abdominal pain - UA rare bacteria  with trace leukocytes and negative nitrites. PH 5.0 - Renal US illustrated left perirenal fluid and a 57mm cyst on the right kidney (upper pole) - Denies dysuria and suprapubic tenderness today  - Urine cultures pending, continue Levaquin  - Will leave foley in place for now, believe the frequent dysuria is due to outlet obstruction   3. Weakness - No overt electrolyte abnormalities including Mg and PO4  - TSH within normal limits  - Likely due to deconditioning  - PT/OT consult, appreciate recommendations   4. Hyponatremia  - Na 131, Serum Osm 286, Urine Osm 720, Urine Na 70  - Isotonic hypovolemic hyponatremia  - IV NS 52mL/hr  5. Failure to thrive  - Previous albumin low - Nutrition consult  6. MGUS - Pancytopenia on recent CBC  - Leukopenia resolved  - Checking free light chains  7. Chronic Cough  - > 30 days  - No hypoxic, possible amiodarone toxicity   8. A-Fib  - Currently in sinus  - Continue amiodarone and Eliquis   Dispo: Anticipated discharge in approximately >2 day(s).   Ina Homes, MD 06/02/2017, 11:21 AM My Pager: [phone]

## 2017-06-03 ENCOUNTER — Inpatient Hospital Stay (HOSPITAL_COMMUNITY): Payer: Medicare Other

## 2017-06-03 LAB — BASIC METABOLIC PANEL
ANION GAP: 5 (ref 5–15)
BUN: 25 mg/dL — ABNORMAL HIGH (ref 6–20)
CO2: 22 mmol/L (ref 22–32)
Calcium: 8.2 mg/dL — ABNORMAL LOW (ref 8.9–10.3)
Chloride: 103 mmol/L (ref 101–111)
Creatinine, Ser: 1.51 mg/dL — ABNORMAL HIGH (ref 0.61–1.24)
GFR, EST AFRICAN AMERICAN: 45 mL/min — AB (ref >60)
GFR, EST NON AFRICAN AMERICAN: 39 mL/min — AB (ref >60)
GLUCOSE: 100 mg/dL — AB (ref 65–99)
Potassium: 3.9 mmol/L (ref 3.5–5.1)
Sodium: 130 mmol/L — ABNORMAL LOW (ref 135–145)

## 2017-06-03 LAB — CBC
HEMATOCRIT: 28.7 % — AB (ref 39.0–52.0)
HEMOGLOBIN: 9.9 g/dL — AB (ref 13.0–17.0)
MCH: 32 pg (ref 26.0–34.0)
MCHC: 34.5 g/dL (ref 30.0–36.0)
MCV: 92.9 fL (ref 78.0–100.0)
Platelets: 109 10*3/uL — ABNORMAL LOW (ref 150–400)
RBC: 3.09 MIL/uL — AB (ref 4.22–5.81)
RDW: 14.5 % (ref 11.5–15.5)
WBC: 6.8 10*3/uL (ref 4.0–10.5)

## 2017-06-03 MED ORDER — HYDROCODONE-ACETAMINOPHEN 5-325 MG PO TABS
1.0000 | ORAL_TABLET | Freq: Four times a day (QID) | ORAL | Status: DC | PRN
  Administered 2017-06-03 – 2017-06-08 (×7): 1 via ORAL
  Filled 2017-06-03 (×8): qty 1

## 2017-06-03 NOTE — Discharge Instructions (Addendum)
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   Information on my medicine - ELIQUIS (apixaban)  Why was Eliquis prescribed for you? Eliquis was prescribed for you to reduce the risk of forming blood clots that can cause a stroke if you have a medical condition called atrial fibrillation (a type of irregular heartbeat) OR to reduce the risk of a blood clots forming after orthopedic surgery.  What do You need to know about Eliquis ? Take your Eliquis TWICE DAILY - one tablet in the morning and one tablet in the evening with or without food.  It would be best to take the doses about the same time each day.  If you have difficulty swallowing the tablet whole please discuss with your pharmacist how to take the medication safely.  Take Eliquis exactly as prescribed by your doctor and DO NOT stop taking Eliquis without talking to the doctor who prescribed the medication.  Stopping may increase your risk of developing a new clot or stroke.  Refill your prescription before you run out.  After discharge, you should have regular check-up appointments with your healthcare provider that is prescribing your Eliquis.  In the future your dose may need to be changed if your kidney function or weight changes by a significant amount or as you get older.  What do you do if you miss a dose? If you miss a dose, take it as soon as you remember on the same day and resume taking twice daily.  Do not take more than one dose of ELIQUIS at the same time.  Important Safety Information A possible side effect of Eliquis is bleeding. You should call your healthcare provider right away if you experience any of the following: ? Bleeding from an injury or your nose that does not stop. ? Unusual colored urine (red or dark brown) or unusual colored stools (red or black). ? Unusual bruising for unknown reasons. ? A serious fall or if you hit your head (even if there is  no bleeding).  Some medicines may interact with Eliquis and might increase your risk of bleeding or clotting while on Eliquis. To help avoid this, consult your healthcare provider or pharmacist prior to using any new prescription or non-prescription medications, including herbals, vitamins, non-steroidal anti-inflammatory drugs (NSAIDs) and supplements.  This website has more information on Eliquis (apixaban): www.DubaiSkin.no.  [place] [facility] Stay Proper nutrition can help your body recover from illness and injury.   Foods and beverages high in protein, vitamins, and minerals help rebuild muscle loss, promote healing, & reduce fall risk.   In addition to eating healthy foods, a nutrition shake is an easy, delicious way to get the nutrition you need during and after your hospital stay  It is recommended that you continue to drink 2 bottles per day of:   Ensure Enlive for at least 1 month (30 days) after your hospital stay   Tips for adding a nutrition shake into your routine: As allowed, drink one with vitamins or medications instead of water or juice Enjoy one as a tasty mid-morning or afternoon snack Drink cold or make a milkshake out of it Drink one instead of milk with cereal or snacks Use as a coffee creamer   Available at the following grocery stores and pharmacies:           * [facility]          *  [place] visit: www.ensure.com/join or http://dawson-may.com/   Suggested Substitutions Ensure Plus = Boost Plus = Carnation Breakfast Essentials = Boost Compact Ensure Active Clear = Boost Breeze Glucerna Shake = Boost Glucose Control = Carnation Breakfast Essentials SUGAR FREE

## 2017-06-03 NOTE — Progress Notes (Signed)
Went to bedside to discuss CT results finding. Discussed the right upper lobe 0.4cm nodule and recommendations for outpatient follow-up. 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

## 2017-06-03 NOTE — Progress Notes (Signed)
Internal Medicine Attending:   I saw and examined the patient. I reviewed the resident's note and I agree with the resident's findings and plan as documented in the resident's note.  Patient feels well this morning with no new complaints. He still has some mild intermittent abdominal pain. He was admitted for likely UTI/possible pyelonephritis causing weakness and falls. Urine culture is still pending. We will continue with IV ceftriaxone for now and transition him to oral antibiotics based on sensitivities. We will continue with his Foley in place given his difficulty urinating likely secondary to his history of prostate cancer. He will need outpatient follow-up with urology for further workup. He will need placement in a SNF on discharge for rehabilitation. No further workup for now.

## 2017-06-03 NOTE — Progress Notes (Signed)
   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 He continues to have intermittent abdominal pain. Discussed the treatment plan and PT/OT recommendation for SNF on discharge. He is agreeable with the plan. No questions or concerns. 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 She has no questions or concerns this AM.   Objective: Vital signs in last 24 hours: Vitals:   06/02/17 1101 06/02/17 1536 06/02/17 2053 06/03/17 0605  BP: (!) 139/59 (!) 136/55 132/67 125/67  Pulse: 64 75 89 97  Resp: 18 18 19 19   Temp: 99.5 F (37.5 C) 99.4 F (37.4 C) 98.5 F (36.9 C) 98.2 F (36.8 C)  TempSrc: Oral Oral Oral Oral  SpO2: 98% 96% 96% 97%  Weight:      Height:       Physical Exam  Constitutional: He is oriented to person, place, and time.  Malnourished, no distress   HENT:  Head: Normocephalic and atraumatic.  Eyes: Pupils are equal, round, and reactive to light. Conjunctivae are normal.  Cardiovascular: Normal rate, regular rhythm, normal heart sounds and intact distal pulses.  Exam reveals no friction rub.   No murmur heard. Pulmonary/Chest: Effort normal and breath sounds normal. He has no wheezes. He has no rales.  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 Bowel sounds are normal. There is no tenderness. There is no rebound.  Musculoskeletal: He exhibits no edema.  Neurological: He is alert and oriented to person, place, and time.  Mental status improving but not to baseline    Assessment/Plan:  Principal Problem:   Altered mental status Active Problems:   Renal insufficiency   Failure to thrive in adult   Dysuria   Abdominal pain  1. Cystitis/Possible Pyelonephritis  - Renal US illustrated left perirenal fluid consistent with possible inflammation  - Urine cultures still pending  - Switched from Levaquin to IV Ceftriaxone on 06/03/2017 due to recent Cipro use and risk factors of flouroquinolones in the elderly  -  Will leave foley in place as he has a hx of prostate cancer and suspicion that his recurrent UTIs are due to outlet obstruction  - AMS continues to improve but not back to baseline    2. Weakness - Likely deconditioning  - PT/OT recommends SNF on discharge  - He is agreeable with the plan   3. Failure to thrive  - Nutrition consulted, PO Intake with supplemental Ensure Enlive   Dispo: Anticipated discharge in approximately 1-2 day(s).   Ina Homes, MD 06/03/2017, 6:36 AM My Pager: [phone]

## 2017-06-04 LAB — URINE CULTURE: Culture: 80000 — AB

## 2017-06-04 LAB — PSA: Prostatic Specific Antigen: 2.96 ng/mL (ref 0.00–4.00)

## 2017-06-04 MED ORDER — DIPHENHYDRAMINE HCL 25 MG PO CAPS: 25.0000 mg | ORAL_CAPSULE | Freq: Every evening | ORAL | Status: DC | PRN

## 2017-06-04 MED ORDER — VANCOMYCIN HCL IN DEXTROSE 1-5 GM/200ML-% IV SOLN
1000.0000 mg | INTRAVENOUS | Status: DC
  Administered 2017-06-04 – 2017-06-06 (×3): 1000 mg via INTRAVENOUS
  Filled 2017-06-04 (×4): qty 200

## 2017-06-04 MED ORDER — HYDROMORPHONE HCL 1 MG/ML IJ SOLN
0.5000 mg | Freq: Once | INTRAMUSCULAR | Status: AC
  Administered 2017-06-04: 0.5 mg via INTRAVENOUS
  Filled 2017-06-04: qty 1

## 2017-06-04 MED ORDER — HYDROCORTISONE 1 % EX CREA
TOPICAL_CREAM | Freq: Three times a day (TID) | CUTANEOUS | Status: DC | PRN
  Administered 2017-06-04: 1 via TOPICAL
  Administered 2017-06-08: 10:00:00 via TOPICAL
  Filled 2017-06-04 (×2): qty 28

## 2017-06-04 MED ORDER — BELLADONNA ALKALOIDS-OPIUM 16.2-60 MG RE SUPP: 1.0000 | Freq: Three times a day (TID) | RECTAL | Status: DC | PRN

## 2017-06-04 NOTE — Clinical Social Work Note (Signed)
Clinical Social Work Assessment  Patient Details  Name: [name] ICENOGLE, [name] [name] MRN: [id number] Date of Birth: 07-01-1925  Date of referral:  06/03/17               Reason for consult:  Discharge Planning, Facility Placement                Permission sought to share information with:  Family Supports, Customer service manager Permission granted to share information::     Name::     [name] [name]  Agency::  SNFs  Relationship::  Daughter  Contact Information:  [phone]  Housing/Transportation Living arrangements for the past 2 months:  [place] of Information:  Adult Children (Dtr) Patient Interpreter Needed:  None Criminal Activity/Legal Involvement Pertinent to Current Situation/Hospitalization:  No - Comment as needed Significant Relationships:  Adult Children, Other(Comment) (Care giver-Maggie) Lives with:  Self Do you feel safe going back to the place where you live?  Yes Need for family participation in patient care:  Yes (Comment)  Care giving concerns:  PT recommending SNF once medically stable for discharged.   Social Worker assessment / plan:  Patient oriented x 3 but sleeping when CSW arrived to room. Patient's daughter at bedside. CSW introduced role and explained that PT recommendations would be discussed. She stated that she did discuss [facility] with unit CSW yesterday. CSW will follow up with [facility] on referral process. Left admissions coordinator a voicemail. Per [name] [name] note, patient will not discharge for another 3-4 days. Patient's daughter stated he will need PTAR at discharge. No further concerns. CSW encouraged patient's daughter to contact CSW as needed. CSW will continue to follow patient and his daughter for support and facilitate discharge to SNF once medically stable.  Employment status:  Retired Science writer) PT Recommendations:  [facility] / Referral to community  resources:  [place]  Patient/Family's Response to care:  Patient not fully oriented and asleep. Patient's daughter agreeable to SNF placement. Patient's daughter supportive and involved in patient's care. Patient appreciated social work intervention.  Patient/Family's Understanding of and Emotional Response to Diagnosis, Current Treatment, and Prognosis:  Patient not fully oriented and asleep. Patient's daughter has a very good understanding of reason for admission and his need for rehab prior to returning home. Discussed previous falls at home, current therapy, current medical status. Patient's daughter is happy with hospital care.  Emotional Assessment Appearance:  Appears stated age Attitude/Demeanor/Rapport:  Other, Unable to Assess (Appropriate) Affect (typically observed):  Unable to Assess (Pt sleeping) Orientation:  Oriented to Self, Oriented to Place, Oriented to Situation Alcohol / Substance use:  Never Used Psych involvement (Current and /or in the community):  No (Comment)  Discharge Needs  Concerns to be addressed:  Care Coordination Readmission within the last 30 days:  No Current discharge risk:  Dependent with Mobility, Lives alone Barriers to Discharge:  Continued Medical Work up   Candie Chroman, LCSW 06/04/2017, 12:27 PM

## 2017-06-04 NOTE — Progress Notes (Signed)
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  He has tolerated Rocephin without noted complication but today has increased complaints.  We have been asked to initiate Vancomycin for Urine culture with   ENTEROCOCCUS FAECALIS   Plan: - Vancomycin 1gm IV q24 hours - Monitor renal function, micro data, s/s infection clearance, length of therapy  Height: 5\' 8"  (172.7 cm) Weight: 180 lb (81.6 kg) IBW/kg (Calculated) : 68.4  Temp (24hrs), Avg:99 F (37.2 C), Min:98.9 F (37.2 C), Max:99.1 F (37.3 C)   Recent Labs Lab 06/01/17 1530 06/01/17 2208 06/02/17 0338 06/03/17 0646  WBC 5.4  --  5.7 6.8  CREATININE 1.50* 1.56* 1.54* 1.51*    Estimated Creatinine Clearance: 31.5 mL/min (A) (by C-G formula based on SCr of 1.51 mg/dL (H)).    Allergies  Allergen Reactions  . Sulfa Antibiotics Swelling  . Chlorhexidine Rash  . Guaifenesin & Derivatives Itching  . Keflex [Cephalexin] Diarrhea  . Oxycodone Itching and Rash  . Oxycontin [Oxycodone Hcl] Itching and Rash  . Penicillins Other (See Comments)    Caused fungal reaction Has patient had a PCN reaction causing immediate rash, facial/tongue/throat swelling, SOB or lightheadedness with hypotension: No Has patient had a PCN reaction causing severe rash involving mucus membranes or skin necrosis: No Has patient had a PCN reaction that required hospitalization No Has patient had a PCN reaction occurring within the last 10 years: No If all of the above answers are "NO", then may proceed with Cephalosporin use.     Antimicrobials this admission: Levaquin 7/18>> 7/19 Rocephin 7/19>>  Dose adjustments this admission: N/A  Microbiology results: 7/18 urine  Rober Minion, PharmD., MS Clinical Pharmacist Pager:  [phone] Thank you for allowing pharmacy to be part of this patients  care team. 06/04/2017 12:45 PM

## 2017-06-04 NOTE — Clinical Social Work Placement (Signed)
   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  Yes   Patient/family informed of their freedom to choose among providers that offer the needed level of care, that participate in Medicare, Medicaid or managed care program needed by the patient, have an available bed and are willing to accept the patient.  Yes   Patient/family informed of [facility]'s ownership interest in [facility] and [facility], as well as of the fact that they are under no obligation to receive care at these facilities.  PASRR submitted to EDS on 06/04/17     PASRR number received on 06/04/17     Existing PASRR number confirmed on       FL2 transmitted to all facilities in geographic area requested by pt/family on 06/04/17     FL2 transmitted to all facilities within larger geographic area on       Patient informed that his/her managed care company has contracts with or will negotiate with certain facilities, including the following:            Patient/family informed of bed offers received.  Patient chooses bed at       Physician recommends and patient chooses bed at      Patient to be transferred to   on  .  Patient to be transferred to facility by       Patient family notified on   of transfer.  Name of family member notified:        PHYSICIAN Please sign FL2, Please sign DNR     Additional Comment:    _______________________________________________ Candie Chroman, LCSW 06/04/2017, 12:31 PM

## 2017-06-04 NOTE — NC FL2 (Signed)
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, [place] [address], [place], [place] [address]      Provider Number: [id number]  Attending Physician Name and Address:  Aldine Contes, MD  Relative Name and Phone Number:       Current Level of Care: Hospital Recommended Level of Care: [place] Prior Approval Number:    Date Approved/Denied:   PASRR Number: [id number]  Discharge Plan: SNF    Current Diagnoses: Patient Active Problem List   Diagnosis Date Noted  . Failure to thrive in adult 06/01/2017  . Dysuria 06/01/2017  . Abdominal pain 06/01/2017  . Altered mental status 06/01/2017  . Acute bronchitis treated with antibiotics in the past 60 days 05/25/2017  . Atrial flutter ([place]) 02/03/2017  . Elevated blood pressure 10/27/2015  . MGUS (monoclonal gammopathy of unknown significance) 06/26/2015  . Essential tremor 06/25/2015  . Right bundle branch block 04/16/2015  . On amiodarone therapy 04/16/2015  . Chronic anticoagulation 01/27/2015  . Clavicle fracture 12/16/2014  . [place] I fracture of maxilla ([place]) 06/22/2014  . Facial fracture due to fall ([place]) 06/07/2014  . Rotator cuff tear, right 01/23/2014  . GERD (gastroesophageal reflux disease)   . Skin lesion of back 12/24/2013  . Memory change   . Leg fatigue   . Bradycardia   . Carotid artery disease ([place])   . Painless hematuria 03/27/2012  . Prostate cancer ([place]) 11/22/2011  . Atrial fibrillation ([place])   . Coronary artery disease   . Hyperlipidemia   . Monoclonal gammopathy   . TIA (transient ischemic attack)   . S/P transurethral resection of prostate   . Cervical spine disease   . Renal insufficiency   . Aortic stenosis   . Aortic insufficiency      Orientation RESPIRATION BLADDER Height & Weight     Self, Situation, Place  Normal Continent, Indwelling catheter Weight: 180 lb (81.6 kg) Height:  5\' 8"  (172.7 cm)  BEHAVIORAL SYMPTOMS/MOOD NEUROLOGICAL BOWEL NUTRITION STATUS   (None)  (None) Continent Diet (Regular)  AMBULATORY STATUS COMMUNICATION OF NEEDS Skin   Extensive Assist Verbally Bruising, Other (Comment) (Open/dehisced wound/incision: Left hand (foam).)                       Personal Care Assistance Level of Assistance  Bathing, Feeding, Dressing Bathing Assistance: Maximum assistance Feeding assistance: Maximum assistance Dressing Assistance: Maximum assistance     Functional Limitations Info  Sight, Hearing, Speech Sight Info: Adequate Hearing Info: Adequate Speech Info: Adequate    SPECIAL CARE FACTORS FREQUENCY  PT (By licensed PT), OT (By licensed OT)     PT Frequency: 5 x week OT Frequency: 5 x week            Contractures Contractures Info: Not present    Additional Factors Info  Code Status, Allergies Code Status Info: DNR Allergies Info: Sulfa Antibiotics, Chlorhexidine, Guaifenesin & Derivatives, Keflex (Cephalexin), Oxycodone, Oxycontin (Oxycodone Hcl), Penicillins           Current Medications (06/04/2017):  This is the current hospital active medication list Current Facility-Administered Medications  Medication Dose Route Frequency Provider Last Rate Last Dose  . acetaminophen (TYLENOL) tablet 650 mg  650 mg Oral Q6H PRN Shela Leff, MD  Or  . acetaminophen (TYLENOL) suppository 650 mg  650 mg Rectal Q6H PRN Shela Leff, MD      . amiodarone (PACERONE) tablet 200 mg  200 mg Oral Daily Shela Leff, MD   200 mg at 06/04/17 5320  . apixaban (ELIQUIS) tablet 2.5 mg  2.5 mg Oral BID Shela Leff, MD   2.5 mg at 06/04/17 2334  . aspirin EC tablet 81 mg  81 mg Oral Q M,W,F Shela Leff, MD   81 mg at 06/03/17 1059  . atorvastatin (LIPITOR) tablet  10 mg  10 mg Oral Daily Shela Leff, MD   10 mg at 06/04/17 3568  . benzonatate (TESSALON) capsule 100 mg  100 mg Oral Q8H PRN Shela Leff, MD   100 mg at 06/01/17 1833  . feeding supplement (ENSURE ENLIVE) (ENSURE ENLIVE) liquid 237 mL  237 mL Oral BID BM Aldine Contes, MD   237 mL at 06/04/17 0938  . HYDROcodone-acetaminophen (NORCO/VICODIN) 5-325 MG per tablet 1 tablet  1 tablet Oral Q6H PRN Welford Roche, MD   1 tablet at 06/04/17 1039  . opium-belladonna (B&O SUPPRETTES) 16.2-60 MG suppository 1 suppository  1 suppository Rectal Q8H PRN Shela Leff, MD      . primidone (MYSOLINE) tablet 50 mg  50 mg Oral BID PRN Shela Leff, MD   50 mg at 06/03/17 2004     Discharge Medications: Please see discharge summary for a list of discharge medications.  Relevant Imaging Results:  Relevant Lab Results:   Additional Information SS#: [id number] Gets outpatient PT at [facility] with Vincente Poli.  Candie Chroman, LCSW

## 2017-06-04 NOTE — Progress Notes (Signed)
Internal Medicine Attending:   I saw and examined the patient. I reviewed the resident's note and I agree with the resident's findings and plan as documented in the resident's note.  Patient complaining of worsening suprapubic abdominal pain today. No fevers or chills. Patient was initially admitted for a complicated urinary tract infection. He was started on Levaquin initially and transitioned to ceftriaxone. Urine culture grew Enterococcus faecalis and patient was started on vancomycin and ceftriaxone was DC'd. We will continue with Foley for now for suspected bladder outlet obstruction. We discussed case with urology who recommended opium belladonna suppository every 8 hours when necessary bladder spasms. Patient will be discharged to SNF once stable. Case discussed with daughter in detail at bedside who is in agreement with plan.

## 2017-06-04 NOTE — Progress Notes (Addendum)
   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 No other complaints.    Objective: Vital signs in last 24 hours: Vitals:   06/03/17 0605 06/03/17 1409 06/03/17 2037 06/04/17 0658  BP: 125/67 (!) 120/54 (!) 129/57 (!) 121/54  Pulse: 97 64 60 62  Resp: 19 19 19 19   Temp: 98.2 F (36.8 C) 99.1 F (37.3 C) 98.9 F (37.2 C) 99.1 F (37.3 C)  TempSrc: Oral Oral Oral Oral  SpO2: 97% 95% 95% 96%  Weight:      Height:       Physical Exam  Constitutional: He is oriented to person, place, and time.  Malnourished, no distress   HENT:  Head: Normocephalic and atraumatic.  Eyes: Right eye exhibits no discharge. Left eye exhibits no discharge.  Cardiovascular: Normal rate, regular rhythm and intact distal pulses.   Pulmonary/Chest: Effort normal and breath sounds normal. He has no wheezes. He has no rales.  Anterior lung fields clear to auscultation. Breath sounds difficult to assess with difficulty moving.   Abdominal: Soft. Bowel sounds are normal. He exhibits no distension. There is no tenderness. There is no rebound and no guarding.  Musculoskeletal: He exhibits no edema.  Neurological: He is alert and oriented to person, place, and time.  Mental status improving but not to baseline    Assessment/Plan:  Principal Problem:   Altered mental status Active Problems:   Renal insufficiency   Failure to thrive in adult   Dysuria   Abdominal pain  1. Cystitis/Possible Pyelonephritis: Urine cx growing >80,000 CFU of enterococcus faecalis. Nursing staff irrigated his foley overnight and it seems to be functioning well now.  - D/c ceftriaxone - Start Vancomycin per pharmacy dosing   - Spoke with Dr. Hermenia Bers (urology), she recommended opium-belladonna suppository q8 prn bladder spasms  - Will leave foley in place as he has a hx of prostate cancer and suspicion that his recurrent UTIs are due to outlet obstruction  - AMS continues to improve but not back to baseline     2. Generalized Weakness: Likely 2/2 decondfitioning: PT/OT recommends SNF on discharge. Daughter is interested in [place].  - Case management involved   3. Failure to thrive  - Nutrition consulted, PO Intake with supplemental Ensure Enlive   4. Hx of prostate cancer: s/p transurethral resection of prostate in 2004 - Check PSA   Dispo: Anticipated discharge in approximately 3-4 day(s).   Shela Leff, MD 06/04/2017, 11:50 AM My Pager: [phone]

## 2017-06-05 NOTE — Care Management Note (Signed)
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 Admitted with AMS 2/2  Renal Insufficiency, FTT, Dysuria, and Abd Pain. CSW following for SNF placement as he will need continued IV therapy and ST rehab                  Action/Plan:CM will sign off for now but will be available should additional discharge needs arise or disposition change.    Expected Discharge Date:                  Expected Discharge Plan:     In-House Referral:  Clinical Social Work  Discharge planning Services  CM Consult  Post Acute Care Choice:  NA Choice offered to:  Patient  DME Arranged:    DME Agency:     HH Arranged:    [place] Agency:     Status of Service:  In process, will continue to follow  If discussed at Long Length of Stay Meetings, dates discussed:    Additional Comments:  Delrae Sawyers, RN 06/05/2017, 10:47 AM

## 2017-06-05 NOTE — Progress Notes (Addendum)
   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 Resting comfortably, pain minimally improved. Discussed the plan to continue IV antibiotics and rehab after discharge. He is agreeable with the plan and has no questions or concerns.   Objective: Vital signs in last 24 hours: Vitals:   06/04/17 0658 06/04/17 1333 06/04/17 2042 06/05/17 0443  BP: (!) 121/54 101/60 (!) 132/53 (!) 157/61  Pulse: 62 66 60 (!) 58  Resp: 19  19 19   Temp: 99.1 F (37.3 C) (!) 97.5 F (36.4 C) 97.9 F (36.6 C) 98.1 F (36.7 C)  TempSrc: Oral Oral Oral Axillary  SpO2: 96% 95% 96% 96%  Weight:      Height:       Physical Exam  Constitutional: He is oriented to person, place, and time. No distress.  HENT:  Head: Normocephalic and atraumatic.  Eyes: Pupils are equal, round, and reactive to light. Conjunctivae are normal.  Cardiovascular: Normal rate and intact distal pulses.   Murmur (AS) heard. Pulmonary/Chest: Effort normal and breath sounds normal. He has no wheezes. He has no rales.  Abdominal: Soft. Bowel sounds are normal. There is no tenderness. There is no rebound.  Musculoskeletal: He exhibits no edema.  Neurological: He is alert and oriented to person, place, and time.  Skin: He is not diaphoretic.   Assessment/Plan: Principal Problem:   Altered mental status Active Problems:   Renal insufficiency   Failure to thrive in adult   Dysuria   Abdominal pain  1. Cystitis/Complicated UTI - Urine cultures +Enterococcus Faecalis, sensitive to Ampicillin, Vancomycin, and Levofloxacin  - Started on Vancomycin 06/04/2017 - Discontinued opium-balladonna suppository q8hr for bladder spasms  - Will leave foley in place as he has a hx of prostate cancer and that Enterococcus UTIs are typically caused by outlet obstructions  - AMS continue to improve but not quite back to baseline per daughter   2. Generalized weakness  - Likely due to deconditioning  - PT/OT recommending SNF  - Daughter requesting  placement at [facility]  - Case management and Social work involved, appreciate the help   3. Failure to Thrive  - PO intake continues to be an issue  - Nutrition consult; recommending supplemental Ensure Enlive   Dispo: Anticipated discharge in approximately 2-3 day(s).   Ina Homes, MD 06/05/2017, 7:07 AM My Pager: [phone]

## 2017-06-05 NOTE — Progress Notes (Signed)
Paged about rash present on the back and scalp. Itchy in nature. Topical hydrocortisone cream provides minimal relief. On physical exam there are erythematous papules diffusely over the back, unrestricted to any one dermatome. No apparent vesicles or pustules. It is possible this is contact dermatitis and treatment was initiated soon enough to prevent vesicular eruption. At this point we will continue the hydrocortisone.   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 On call PT notified. Will see tomorrow.

## 2017-06-06 DIAGNOSIS — R531 Weakness: Secondary | ICD-10-CM

## 2017-06-06 LAB — BASIC METABOLIC PANEL
Anion gap: 3 — ABNORMAL LOW (ref 5–15)
BUN: 18 mg/dL (ref 6–20)
CO2: 25 mmol/L (ref 22–32)
CREATININE: 1.28 mg/dL — AB (ref 0.61–1.24)
Calcium: 7.9 mg/dL — ABNORMAL LOW (ref 8.9–10.3)
Chloride: 106 mmol/L (ref 101–111)
GFR calc Af Amer: 55 mL/min — ABNORMAL LOW (ref >60)
GFR, EST NON AFRICAN AMERICAN: 48 mL/min — AB (ref >60)
Glucose, Bld: 97 mg/dL (ref 65–99)
Potassium: 4 mmol/L (ref 3.5–5.1)
SODIUM: 134 mmol/L — AB (ref 135–145)

## 2017-06-06 LAB — URINE CULTURE

## 2017-06-06 NOTE — Progress Notes (Signed)
   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 States that he still have abdominal pain but it has improved from days prior. Cough is improved. Appetitie still poor. Discussed plans for rehab placement and continued antibiotic therapy. PT will see him today. No questions or concerns at this point.   Objective: Vital signs in last 24 hours: Vitals:   06/05/17 0443 06/05/17 1420 06/05/17 2139 06/06/17 0423  BP: (!) 157/61 117/67 (!) 142/62 (!) 165/59  Pulse: (!) 58 62 60 (!) 55  Resp: 19 18 18 16   Temp: 98.1 F (36.7 C) 98.1 F (36.7 C) 99 F (37.2 C) 98.6 F (37 C)  TempSrc: Axillary Oral Oral Oral  SpO2: 96% 98% 94% 96%  Weight:      Height:       Physical Exam  Constitutional: He is oriented to person, place, and time and well-developed, well-nourished, and in no distress.  HENT:  Head: Normocephalic and atraumatic.  Eyes: Pupils are equal, round, and reactive to light.  Cardiovascular: Normal rate, regular rhythm and intact distal pulses.   Murmur (AS) heard. Pulmonary/Chest: Effort normal and breath sounds normal.  Abdominal: Soft. Bowel sounds are normal. There is tenderness (Suprapubic). There is no rebound.  Musculoskeletal: He exhibits no edema.  Neurological: He is alert and oriented to person, place, and time.  Skin: Skin is warm and dry.   Assessment/Plan:  Principal Problem:   Altered mental status Active Problems:   Renal insufficiency   Failure to thrive in adult   Dysuria   Abdominal pain  1. Cystitis/Complicated UTI - Urine cultures +Enterococcus Faecalis, sensitive to Ampicillin, Vancomycin, and Levofloxacin  - Started on Vancomycin 06/04/2017, Cr at 1.28 - Discontinued opium-balladonna suppository q8hr for bladder spasms  - Will leave foley in place as he has a hx of prostate cancer and that Enterococcus UTIs are typically caused by outlet obstructions  - AMS continue to improve but not quite back to baseline per daughter   2. Generalized weakness   - Likely due to deconditioning  - PT/OT recommending SNF  - Daughter requesting placement at [facility] in [facility]  - Case management and Social work involved, appreciate the help   3. Failure to Thrive  - PO intake continues to be an issue  - Nutrition consult; recommending supplemental Ensure Enlive   Dispo: Anticipated discharge in approximately 1-2 day(s).   Ina Homes, MD 06/06/2017, 11:26 AM My Pager: [phone]

## 2017-06-06 NOTE — Progress Notes (Signed)
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 Concern for cystitis. Renal US illustrated left perirenal fluid and a 53mm cyst on the right kidney. PMH includes CKD stage III, MGUS, Prostate cancer, TIA, HLD, CAD, A-fib, aortic stenosis, MR.     PT Comments    Pt handled tx well today secondary to fatigue. Pt c/o increase pain when urinating, SPTA decreased level of external urine bladder from level of genitals to below, pt stated "It's getting better". Pt requires Max assist +2 with stedy during transfers. Once pt in steady, pt able to stand with mod assist, and performed static standing with x2 BUE reaches within BOS with min assist. Pt responded well to tactile cues and targets when given to him. Pt has decreased endurance with transfers and static standing during PT, plan of care remains appropriate. Plan for next tx to focus on increasing static standing endurance and overall balance.   Follow Up Recommendations  SNF;Supervision for mobility/OOB;Supervision/Assistance - 24 hour     Equipment Recommendations  None recommended by PT    Recommendations for Other Services       Precautions / Restrictions Precautions Precautions: Fall Restrictions Weight Bearing Restrictions: No    Mobility  Bed Mobility Overal bed mobility: Needs Assistance Bed Mobility: Sit to Supine       Sit to supine: +2 for physical assistance;Max assist   General bed mobility comments: Assist to bring LEs to EOB, scoot bottom and elevate trunk; heavy posterior lean.  Transfers Overall transfer level: Needs assistance Equipment used: Ambulation equipment used Transfers: Sit to/from Stand Sit to Stand: Max assist;+2 physical assistance;From elevated surface          General transfer comment: Pt attempted sit to stand to RW, unsuccessful due to weakness and fatigue. Pt able to perform transfers more efficiently with stedy, utilizing an increase in BUE to assist in pulling to standing position.   Ambulation/Gait                 Stairs            Wheelchair Mobility    Modified Rankin (Stroke Patients Only)       Balance Overall balance assessment: Needs assistance;History of Falls Sitting-balance support: Feet supported;Bilateral upper extremity supported Sitting balance-Leahy Scale: Poor Sitting balance - Comments: Pt displayed with an increase posterior lean at EOB with min assist for support. Pt fatigues rapidly when in an unsupported position.  Postural control: Posterior lean Standing balance support: During functional activity;Bilateral upper extremity supported Standing balance-Leahy Scale: Poor Standing balance comment: Pt able to maintain static standing in steady using BUE support with min A, for ~ 1 min, and achieved x2 standing BUE reaches within BOS.                             Cognition Arousal/Alertness: Awake/alert Behavior During Therapy: WFL for tasks assessed/performed;Flat affect Overall Cognitive Status: Impaired/Different from baseline Area of Impairment: Problem solving                             Problem Solving: Slow processing;Difficulty sequencing;Requires verbal cues;Requires tactile cues General Comments: Slow to respond to questions and commands.  Exercises      General Comments General comments (skin integrity, edema, etc.): Pt's daughter present during session.      Pertinent Vitals/Pain Pain Assessment: Faces Faces Pain Scale: Hurts even more Pain Location: abdomen, L forearm, R wrist.  Pain Descriptors / Indicators: Sore;Discomfort Pain Intervention(s): Limited activity within patient's tolerance;Monitored during session;Repositioned    Home Living                       Prior Function            PT Goals (current goals can now be found in the care plan section) Acute Rehab PT Goals Patient Stated Goal: none stated PT Goal Formulation: With patient Potential to Achieve Goals: Fair Progress towards PT goals: Not progressing toward goals - comment (Pt fatiguing quickly with min activity.)    Frequency    Min 2X/week      PT Plan Current plan remains appropriate    Co-evaluation     PT goals addressed during session: Mobility/safety with mobility;Balance;Strengthening/ROM        AM-PAC PT "6 Clicks" Daily Activity  Outcome Measure  Difficulty turning over in bed (including adjusting bedclothes, sheets and blankets)?: Total Difficulty moving from lying on back to sitting on the side of the bed? : Total Difficulty sitting down on and standing up from a chair with arms (e.g., wheelchair, bedside commode, etc,.)?: Total Help needed moving to and from a bed to chair (including a wheelchair)?: Total Help needed walking in hospital room?: Total Help needed climbing 3-5 steps with a railing? : Total 6 Click Score: 6    End of Session Equipment Utilized During Treatment: Gait belt Activity Tolerance: Patient limited by fatigue Patient left: in bed;with call bell/phone within reach;with family/visitor present Nurse Communication: Mobility status;Need for lift equipment PT Visit Diagnosis: Muscle weakness (generalized) (M62.81);Unsteadiness on feet (R26.81);Difficulty in walking, not elsewhere classified (R26.2)     Time: 2637-8588 PT Time Calculation (min) (ACUTE ONLY): 25 min  Charges:  $Therapeutic Activity: 23-37 mins                    G CodesRandalyn Rhea, [place] [phone]   Randalyn Rhea 06/06/2017, 4:35 PM

## 2017-06-06 NOTE — Progress Notes (Signed)
Internal Medicine Attending:   I saw and examined the patient. I reviewed the resident's note and I agree with the resident's findings and plan as documented in the resident's note.  Patient feels well with no new complaints. He still has generalized weakness and fatigues easily. His cough is improved and his suprapubic pain has improved as well. Patient was found to have a complicated urinary tract infection with Enterococcus faecalis and was started on vancomycin with dosing per pharmacy. We will leave his Foley in place for now and have him follow-up with urology as an outpatient. Patient will likely be discharged to SNF tomorrow and have antibiotics transitioned to oral fosfomycin. No further workup for now. We'll continue to monitor.

## 2017-06-06 NOTE — Discharge Summary (Signed)
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 PCP: Annia Belt, MD  Date of Admission: 06/01/2017  2:44 PM Date of Discharge: 06/08/2017 Attending Physician: Aldine Contes, MD  Discharge Diagnosis: 1. Complicated UTI 2. Altered Mental Status  3. Renal Insufficiency  4. Weakness   Principal Problem:   Complicated UTI (urinary tract infection) Active Problems:   Renal insufficiency   Abdominal pain   Altered mental status   Weakness  Discharge Medications: Allergies as of 06/08/2017      Reactions   Sulfa Antibiotics Swelling   Chlorhexidine Rash   Guaifenesin & Derivatives Itching   Keflex [cephalexin] Diarrhea   Oxycodone Itching, Rash   Oxycontin [oxycodone Hcl] Itching, Rash   Penicillins Other (See Comments)   Caused fungal reaction Has patient had a PCN reaction causing immediate rash, facial/tongue/throat swelling, SOB or lightheadedness with hypotension: No Has patient had a PCN reaction causing severe rash involving mucus membranes or skin necrosis: No Has patient had a PCN reaction that required hospitalization No Has patient had a PCN reaction occurring within the last 10 years: No If all of the above answers are "NO", then may proceed with Cephalosporin use.      Medication List    STOP taking these medications   levofloxacin 250 MG tablet Commonly known as:  LEVAQUIN     TAKE these medications   amiodarone 200 MG tablet Commonly known as:  PACERONE Take 1 tablet (200 mg total) by mouth daily.   aspirin EC 81 MG tablet Take 81 mg by mouth every Monday, Wednesday, and Friday.   atorvastatin 10 MG tablet Commonly known as:  LIPITOR TAKE 1 TABLET BY MOUTH ONCE DAILY What changed:  See the new instructions.   benzonatate 100 MG capsule Commonly known as:  TESSALON PERLES Take 1 capsule (100 mg total) by mouth every 8 (eight) hours as needed for cough.   diphenhydrAMINE-zinc acetate cream Commonly known as:   BENADRYL Apply topically 3 (three) times daily as needed for itching.   ELIQUIS 2.5 MG Tabs tablet Generic drug:  apixaban TAKE 1 TABLET BY MOUTH 2 TIMES DAILY What changed:  See the new instructions.   feeding supplement (ENSURE ENLIVE) Liqd Take 237 mLs by mouth 2 (two) times daily between meals.   fosfomycin 3 g Pack Commonly known as:  MONUROL Take 3 g by mouth every 3 (three) days.   primidone 50 MG tablet Commonly known as:  MYSOLINE Take 1 tablet (50 mg total) by mouth 2 (two) times daily at 10 AM and 5 PM. What changed:  See the new instructions.       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  At the hospital follow up visit please address:  1.  Continue to work with PT/OT for strength and coordination. Please finish the Fosfomycin 3g every 3 days (1st dose 06/07/2017, 2nd dose 06/10/2017, 3rd dose 06/13/2017). Please follow-up with urology in 2-3 weeks. Schedule the Primidone for twice daily, once in the AM and once in the PM  2.  Labs / imaging needed at time of follow-up: N/A  3.  Pending labs/ test needing follow-up: N/A  Follow-up Appointments:  Contact information for follow-up providers    Irine Seal, MD. Go in 2 week(s).   Specialty:  Urology Contact information: [place] [address] [phone]  Contact information for after-discharge care    Destination    [facility] Follow up.   Specialty:  [place] information: [address] [place] [facility] Course by problem list: Principal Problem:   Complicated UTI (urinary tract infection) Active Problems:   Renal insufficiency   Abdominal pain   Altered mental status   Weakness   1. Complicated UTI. 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 History was complicated by recent antibiotic use including Cipro and Levaquin. Initial UA was clean showing rare bacteria, trace leukocytes, and no nitrites. However, due to recent antibiotic use he was started on Ceftriaxone and his urine sent for cultures. Cultures subsequently grew Enterococcus faecalis with adequate sensitivity to ampicillin, levofloxacin, and vancomycin. 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 Over the course of several days his dysuria and abdominal pain improved. 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  2. Altered Mental Status. 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 His mentation continued to improved over the course of his hospitalization. Daughter states that it is getting closer to baseline.    3. Renal Insufficiency. 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 His kidney functions was monitored throughout the hospitalization with no signs of further deterioration. He was discharged with a creatinine near baseline.   4. Weakness. 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 PT was consulted and recommended SNF placement on discharge. They continued to work with him throughout his hospitalization. 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   Discharge Vitals:   BP (!) 156/64 (BP Location: Right Arm)   Pulse (!) 54   Temp 98.1 F (36.7 C) (Oral)   Resp 16   Ht 5\' 8"  (1.727 m)   Wt 180 lb (81.6 kg)   SpO2 96%   BMI 27.37 kg/m   Pertinent Labs, Studies, and Procedures:  Chest X-ray  IMPRESSION: No edema or consolidation. Stable cardiac silhouette. Aortic atherosclerosis. Old fractures appears stable. Bones osteoporotic.  US Renal IMPRESSION: 1. Small but abnormal amount of left perirenal fluid along the capsular margin of the left kidney, not visible on the prior ultrasound from 04/18/2017, and raising the possibility of an inflammatory process involving the left kidney. No current hydronephrosis. No similar finding on the right. 2. Right kidney upper pole 9 mm cyst.  CT Chest w/out contrast  IMPRESSION: Negative for pneumonia. No acute disease or finding to explain the patient's cough. Mild dependent atelectasis noted.  Calcific coronary artery disease. Aortic valve calcifications and calcification of the mitral annulus also noted.  0.4 cm right upper  lobe pulmonary nodule. No follow-up needed if patient is low-risk. Non-contrast chest CT can be considered in 12 months if patient is high-risk. This recommendation follows the consensus statement: Guidelines for Management of Incidental Pulmonary Nodules Detected on CT Images: From the Fleischner Society 2017; Radiology 2017; 284:228-243.  Small gravel type gallstones of its of cholecystitis.  Increased density of the liver is nonspecific but can be seen in iron deposition disease such as hemochromatosis, glycogen storage diseases, certain drugs such as amiodarone.  Discharge Instructions: Discharge Instructions    Increase activity slowly    Complete by:  As directed      Signed: Ina Homes, MD 06/08/2017, 1:51 PM   My Pager: [phone]

## 2017-06-06 NOTE — Care Management Important Message (Signed)
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

## 2017-06-06 NOTE — Progress Notes (Signed)
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 Concern for cystitis. Renal US illustrated left perirenal fluid and a 49mm cyst on the right kidney. PMH includes CKD stage III, MGUS, Prostate cancer, TIA, HLD, CAD, A-fib, aortic stenosis, MR.    OT comments  Pt making progress with functional goals, however continues to requires + 2 assist and fatigues easily. Pt's daughter very involved and expresses desire for pt to d/c to [facility] for further rehab after acute stay. OT will continue to follow acutely  Follow Up Recommendations  SNF;Supervision/Assistance - 24 hour    Equipment Recommendations  Other (comment) (TBD at next venue of care)    Recommendations for Other Services      Precautions / Restrictions Precautions Precautions: Fall Restrictions Weight Bearing Restrictions: No       Mobility Bed Mobility Overal bed mobility: Needs Assistance Bed Mobility: Supine to Sit;Sit to Supine;Rolling Rolling: Max assist;+2 for physical assistance   Supine to sit: Max assist;+2 for physical assistance;HOB elevated Sit to supine: +2 for physical assistance;Max assist      Transfers                 General transfer comment: NT    Balance Overall balance assessment: Needs assistance;History of Falls Sitting-balance support: Feet supported;Bilateral upper extremity supported Sitting balance-Leahy Scale: Poor Sitting balance - Comments: posterior lean initially at EOB with mod A for support/balance for ~ 2 minutes then patient able to balance self with min guard A using rail on R side                                   ADL either performed or assessed with clinical judgement   ADL Overall ADL's : Needs  assistance/impaired Eating/Feeding: Sitting Eating/Feeding Details (indicate cue type and reason): mingaurd A simulated sitting EOB Grooming: Sitting;Wash/dry hands;Wash/dry face;Min guard   Upper Body Bathing: Sitting;Moderate assistance (simulated)   Lower Body Bathing: Moderate assistance (simulated)                 Toileting - Clothing Manipulation Details (indicate cue type and reason): Pt sat EOB x 7 minutes until too fatigued and requested to return to sup in bed             Vision Baseline Vision/History: Wears glasses Wears Glasses: Reading only Patient Visual Report: No change from baseline                Cognition Arousal/Alertness: Awake/alert Behavior During Therapy: WFL for tasks assessed/performed Overall Cognitive Status: Impaired/Different from baseline Area of Impairment: Problem solving;Following commands                       Following Commands: Follows one step commands with increased time     Problem Solving: Slow processing;Requires verbal cues;Requires tactile cues General Comments: Slow to respond to questions and commands.                     General Comments  pt very pleasant and cooperative    Pertinent Vitals/ Pain       Pain Assessment: 0-10 Pain Score: 5  Pain Location: abdomen, L forearm Pain Descriptors /  Indicators: Sore Pain Intervention(s): Limited activity within patient's tolerance;Monitored during session;Repositioned  Home Living                                          Prior Functioning/Environment              Frequency  Min 2X/week        Progress Toward Goals  OT Goals(current goals can now be found in the care plan section)  Progress towards OT goals: Progressing toward goals     Plan Discharge plan remains appropriate                     AM-PAC PT "6 Clicks" Daily Activity     Outcome Measure   Help from another person eating meals?: A  Little Help from another person taking care of personal grooming?: A Little Help from another person toileting, which includes using toliet, bedpan, or urinal?: Total Help from another person bathing (including washing, rinsing, drying)?: A Lot Help from another person to put on and taking off regular upper body clothing?: Total Help from another person to put on and taking off regular lower body clothing?: Total 6 Click Score: 11    End of Session    OT Visit Diagnosis: Unsteadiness on feet (R26.81);Other abnormalities of gait and mobility (R26.89);Repeated falls (R29.6);Muscle weakness (generalized) (M62.81);History of falling (Z91.81);Other symptoms and signs involving cognitive function   Activity Tolerance Patient limited by fatigue   Patient Left with call bell/phone within reach;with family/visitor present;in bed   Nurse Communication          Time: 5465-6812 OT Time Calculation (min): 33 min  Charges: OT General Charges $OT Visit: 1 Procedure OT Treatments $Self Care/Home Management : 8-22 mins $Therapeutic Activity: 8-22 mins     Britt Bottom 06/06/2017, 12:17 PM

## 2017-06-06 NOTE — Care Management Important Message (Signed)
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

## 2017-06-06 NOTE — Clinical Social Work Note (Signed)
CSW spoke with Dorene Sorrow [facility] admissions) today via phone about status of bed offer. Ms. Bobbye Charleston states [facility] is not able to extend a bed offer due to concern pt does not have a qualifying diagnosis. Ms. Bobbye Charleston also states pt's insurance is out of network with [facility]. CSW asked if pt can pay privately would he be extended a bed offer and Ms. Bobbye Charleston declined stating beds are limited. Ms. Bobbye Charleston states dtr can call Jerilynn Som tomorrow if any further questions, but the clinicals have already been reviewed and a decision has been made.   CSW updated MD and daughter-[name] Owens Shark who was not pleased. CSW asked for an alternate SNF placement. Dtr states she will inform CSW after speaking with [facility] on Tuesday. CSW informed dtr that this CSW will not be present tomorrow but covering CSW will follow up with her. CSW will continue to follow for disposition needs.   Oretha Ellis, [place], [place] Work [phone]

## 2017-06-07 DIAGNOSIS — N390 Urinary tract infection, site not specified: Secondary | ICD-10-CM

## 2017-06-07 MED ORDER — FOSFOMYCIN TROMETHAMINE 3 G PO PACK
3.0000 g | PACK | ORAL | Status: DC
  Administered 2017-06-07: 3 g via ORAL
  Filled 2017-06-07: qty 3

## 2017-06-07 MED ORDER — PRIMIDONE 50 MG PO TABS
50.0000 mg | ORAL_TABLET | Freq: Two times a day (BID) | ORAL | Status: DC
  Administered 2017-06-08: 50 mg via ORAL
  Filled 2017-06-07: qty 1

## 2017-06-07 MED ORDER — DIPHENHYDRAMINE-ZINC ACETATE 2-0.1 % EX CREA
TOPICAL_CREAM | Freq: Three times a day (TID) | CUTANEOUS | Status: DC | PRN
  Administered 2017-06-08: 01:00:00 via TOPICAL
  Filled 2017-06-07: qty 28

## 2017-06-07 NOTE — Progress Notes (Signed)
Pt complained of pain score of 7 but refused pain meds, his daughter at the bedside.

## 2017-06-07 NOTE — Consult Note (Signed)
Urology Consult  Referring physician: Orie Fisherman Reason for referral: UTI  Chief Complaint: UTI  History of Present Illness: Patient of Dr. Clemetine Marker for UTI; has had untreated prostate cancer and previous TUNA/TURP; recent UTI and gross hematuria early June; Cysto then demonstrated edematous and bloody bladder neck and renal u/sound normal  Admitted July 18th with weakness and dysuria; altered mental status; Enterococcus in urine; has foley in place; now on fosfomycin;   Repeat renal u/sound demonstrated left perirenal fluid collection new in keeping with possible UTI; no hydro  Serum Cr now 1.28 from 1.5  Modifying factors: There are no other modifying factors  Associated signs and symptoms: There are no other associated signs and symptoms Aggravating and relieving factors: There are no other aggravating or relieving factors Severity: Moderate Duration: Persistent  Past Medical History:  Diagnosis Date  . Acute bronchitis treated with antibiotics in the past 60 days 05/25/2017   Persistent cough despite two courses of antibiotics 05/25/17  . Anemia   . Aortic insufficiency    Mild, echo, July, 2011  . Aortic stenosis    Mild, echo, July, 2011  . Atrial fibrillation [facility]) 2011, 2015   Multaq Started September, 2011, dose adjusted October, 2011, 200 mg a.m., 400 mg p.o.  . Bradycardia    Sinus bradycardia while on 25 Lopressor twice a day October, 2013  . Carotid artery disease ([place])    Doppler, 2008 no significant abnormality  . Cervical spine disease   . Chronic anticoagulation 01/27/2015   pradaxa 75 mg BID for A fib  . Coronary artery disease    minimal, cath 2005 / catheterization August, 2011, 30% in 3 vessels, normal LV function  . Diarrhea    Chronic  . Drug therapy    Pradaxa Started July, 2011  . Ejection fraction    EF 60%, echo, 2009  /  TEE normal August, 2011 /   EF 60%, echo, July, 2011  . GERD (gastroesophageal reflux disease)    Esophageal dilatation in 1992,  some esophageal spasm  . Hyperlipidemia   . Incomplete right bundle branch block    August, 2012  . Leg fatigue    June, 2014  . Memory change    June, 2014  . Monoclonal gammopathy    granfortuna  . MR (mitral regurgitation) 2009   mild, Echo, July, 2011  . Painless hematuria 03/27/2012   Occurred x 2 5/12 & 5/13 on Pradaxa 75 mg BID  . Prostate cancer ([place]) 11/22/2011  . Renal insufficiency    Prior creatinine 1.2 /  September, 2011.. 1.7  /  October, 2011.. 1.8  . S/P transurethral resection of prostate 2004   2004  . TIA (transient ischemic attack)    Dr. Erling Cruz,, question in the past. when off ASA for 10 days   Past Surgical History:  Procedure Laterality Date  . CARDIAC CATHETERIZATION    . CARDIOVERSION  08/01/2012   Procedure: CARDIOVERSION;  Surgeon: Carlena Bjornstad, MD;  Location: [facility] ENDOSCOPY;  Service: Cardiovascular;  Laterality: N/A;  . CARDIOVERSION N/A 11/29/2013   Procedure: CARDIOVERSION;  Surgeon: Carlena Bjornstad, MD;  Location: [facility] ENDOSCOPY;  Service: Cardiovascular;  Laterality: N/A;  . CARDIOVERSION N/A 11/27/2014   Procedure: CARDIOVERSION;  Surgeon: Carlena Bjornstad, MD;  Location: [facility] ENDOSCOPY;  Service: Cardiovascular;  Laterality: N/A;  . CARDIOVERSION N/A 03/12/2015   Procedure: CARDIOVERSION;  Surgeon: Carlena Bjornstad, MD;  Location: [place];  Service: Cardiovascular;  Laterality: N/A;  . CARDIOVERSION N/A  12/12/2015   Procedure: CARDIOVERSION;  Surgeon: Fay Records, MD;  Location: [facility] ENDOSCOPY;  Service: Cardiovascular;  Laterality: N/A;  . CARDIOVERSION N/A 02/24/2017   Procedure: CARDIOVERSION;  Surgeon: Pixie Casino, MD;  Location: [place];  Service: Cardiovascular;  Laterality: N/A;  . CATARACT EXTRACTION    . COLONOSCOPY    . EYE SURGERY Bilateral    cataract extraction  . Fibular fracture repair    . HERNIA REPAIR    . History of TURP    . OPEN REDUCTION INTERNAL FIXATION (ORIF) METACARPAL Left 06/22/2014   Procedure: OPEN REDUCTION  INTERNAL FIXATION (ORIF) LEFT HAND METACARPAL;  Surgeon: Roseanne Kaufman, MD;  Location: [place];  Service: Orthopedics;  Laterality: Left;  . ORIF ORBITAL FRACTURE Bilateral 06/22/2014   Procedure: OPEN REDUCTION INTERNAL FIXATION (ORIF) BILATERAL LEFORTE1 FRACTURE OF MAXILLA, HYBRID ARCH BARS ;  Surgeon: Izora Gala, MD;  Location: [place];  Service: ENT;  Laterality: Bilateral;  . SHOULDER ARTHROSCOPY WITH ROTATOR CUFF REPAIR Right 01/23/2014   DR Noemi Chapel   . SHOULDER ARTHROSCOPY WITH ROTATOR CUFF REPAIR Right 01/23/2014   Procedure: RIGHT SHOULDER ARTHROSCOPY WITH DEBRIDEMENT AND ROTATOR CUFF REPAIR;  Surgeon: Lorn Junes, MD;  Location: [place];  Service: Orthopedics;  Laterality: Right;    Medications: I have reviewed the patient's current medications. Allergies:  Allergies  Allergen Reactions  . Sulfa Antibiotics Swelling  . Chlorhexidine Rash  . Guaifenesin & Derivatives Itching  . Keflex [Cephalexin] Diarrhea  . Oxycodone Itching and Rash  . Oxycontin [Oxycodone Hcl] Itching and Rash  . Penicillins Other (See Comments)    Caused fungal reaction Has patient had a PCN reaction causing immediate rash, facial/tongue/throat swelling, SOB or lightheadedness with hypotension: No Has patient had a PCN reaction causing severe rash involving mucus membranes or skin necrosis: No Has patient had a PCN reaction that required hospitalization No Has patient had a PCN reaction occurring within the last 10 years: No If all of the above answers are "NO", then may proceed with Cephalosporin use.     No family history on file. Social History:  reports that he has quit smoking. He has never used smokeless tobacco. He reports that he drinks about 1.2 oz of alcohol per week . He reports that he does not use drugs.  ROS: All systems are reviewed and negative except as noted. Rest negative  Physical Exam:  Vital signs in last 24 hours: Temp:  [97.8 F (36.6 C)-98.6 F (37 C)] 98 F (36.7 C) (07/24  0611) Pulse Rate:  [54-59] 55 (07/24 0611) BP: (143-167)/(59-63) 143/63 (07/24 0611) SpO2:  [94 %-96 %] 94 % (07/24 1610)  Cardiovascular: Skin warm; not flushed Respiratory: Breaths quiet; no shortness of breath Abdomen: No masses Neurological: Normal sensation to touch Musculoskeletal: Normal motor function arms and legs Lymphatics: No inguinal adenopathy Skin: No rashes Genitourinary:no tenderness and foley OK  Laboratory Data:  Results for orders placed or performed during the hospital encounter of 06/01/17 (from the past 72 hour(s))  Basic metabolic panel     Status: Abnormal   Collection Time: 06/06/17 12:25 AM  Result Value Ref Range   Sodium 134 (L) 135 - 145 mmol/L   Potassium 4.0 3.5 - 5.1 mmol/L   Chloride 106 101 - 111 mmol/L   CO2 25 22 - 32 mmol/L   Glucose, Bld 97 65 - 99 mg/dL   BUN 18 6 - 20 mg/dL   Creatinine, Ser 1.28 (H) 0.61 - 1.24 mg/dL   Calcium  7.9 (L) 8.9 - 10.3 mg/dL   GFR calc non Af Amer 48 (L) >60 mL/min   GFR calc Af Amer 55 (L) >60 mL/min    Comment: (NOTE) The eGFR has been calculated using the CKD EPI equation. This calculation has not been validated in all clinical situations. eGFR's persistently <60 mL/min signify possible Chronic Kidney Disease.    Anion gap 3 (L) 5 - 15   Recent Results (from the past 240 hour(s))  Culture, Urine     Status: Abnormal   Collection Time: 05/31/17  2:30 PM  Result Value Ref Range Status   Urine Culture, Routine Final report (A)  Final   Organism ID, Bacteria Enterococcus faecalis (A)  Final    Comment: 50,000-100,000 colony forming units per mL Note: this isolate is vancomycin-susceptible. This information is provided for epidemiologic purposes only: vancomycin is not among the antibiotics recommended for therapy of urinary tract infections caused by Enterococcus.    ORGANISM ID, BACTERIA Comment  Final    Comment: Mixed urogenital flora 10,000-25,000 colony forming units per mL    Antimicrobial  Susceptibility Comment  Final    Comment:       ** S = Susceptible; I = Intermediate; R = Resistant **                    P = Positive; N = Negative             MICS are expressed in micrograms per mL    Antibiotic                 RSLT#1    RSLT#2    RSLT#3    RSLT#4 Ciprofloxacin                  S Levofloxacin                   S Nitrofurantoin                 S Penicillin                     S Tetracycline                   R Vancomycin                     S   Culture, Urine     Status: Abnormal   Collection Time: 06/01/17 11:26 AM  Result Value Ref Range Status   Specimen Description URINE, RANDOM  Final   Special Requests NONE  Final   Culture 80,000 COLONIES/mL ENTEROCOCCUS FAECALIS (A)  Final   Report Status 06/04/2017 FINAL  Final   Organism ID, Bacteria ENTEROCOCCUS FAECALIS (A)  Final      Susceptibility   Enterococcus faecalis - MIC*    AMPICILLIN <=2 SENSITIVE Sensitive     LEVOFLOXACIN 1 SENSITIVE Sensitive     NITROFURANTOIN <=16 SENSITIVE Sensitive     VANCOMYCIN 1 SENSITIVE Sensitive     * 80,000 COLONIES/mL ENTEROCOCCUS FAECALIS   Creatinine:  Recent Labs  06/01/17 1530 06/01/17 2208 06/02/17 0338 06/03/17 0646 06/06/17 0025  CREATININE 1.50* 1.56* 1.54* 1.51* 1.28*    Xrays: See report/chart As above  Impression/Assessment:  UTI in patient followed by urology; has appointment on Thursday   Plan:  Continue with foley and treatment plan; postpone f/up with Dr Jeffie Pollock for two weeks; number given to family; early am visit  in about two weeks with foley; possibly start antibiotic prophylaxis  Jaila Schellhorn A 06/07/2017, 4:24 PM

## 2017-06-07 NOTE — Progress Notes (Signed)
   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 States that he only has suprapubic pain when he needs to urinate, at which point it is 10/10. He has not received any pain medications. He was denied access to [facility] due to documented diagnosis of Failure to Thrive. Discussed with social work that this is 2/2 to complicated UTI and deconditioning. Will try to contact [facility] again today.   Dr. [name] is requesting a stop in IV Vancomycin and that we can switch to Fosfomycin. Will continue IVF because he has poor oral intake. Agreeable with the plan.   Objective: Vital signs in last 24 hours: Vitals:   06/06/17 0423 06/06/17 1700 06/06/17 2145 06/07/17 0611  BP: (!) 165/59 (!) 167/62 (!) 156/59 (!) 143/63  Pulse: (!) 55 (!) 59 (!) 54 (!) 55  Resp: 16     Temp: 98.6 F (37 C) 98.6 F (37 C) 97.8 F (36.6 C) 98 F (36.7 C)  TempSrc: Oral Oral Oral Oral  SpO2: 96% 95% 96% 94%  Weight:      Height:       Physical Exam  Constitutional: He is oriented to person, place, and time.  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 No distress  HENT:  Head: Normocephalic and atraumatic.  Eyes: Pupils are equal, round, and reactive to light. Conjunctivae are normal.  Cardiovascular: Normal rate, regular rhythm and intact distal pulses.   Murmur (AS) heard. Pulmonary/Chest: Effort normal and breath sounds normal. He has no wheezes. He has no rales.  Abdominal: Soft. Bowel sounds are normal. There is no tenderness. There is no rebound.  Musculoskeletal: He exhibits no edema.  Neurological: He is alert and oriented to person, place, and time.  Skin:  Diffuse erythematous papules on the back   Assessment/Plan: Principal Problem:   Complicated UTI (urinary tract infection) Active Problems:   Renal insufficiency   Abdominal pain   Altered mental status   Weakness  1. Cystitis/Complicated UTI - Urine cultures +Enterococcus Faecalis, sensitive to Ampicillin, Vancomycin, and Levofloxacin  -  Discontinued Vancomycin IV today and started on Fosfomycin 3g every 3 days (1st of 3 doses) - Will leave foley in place as he has a hx of prostate cancer and that Enterococcus UTIs are typically caused by outlet obstructions  - Discussed case with urology   2. 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 He is deconditioned 2/2 a complicated UTI.   3. Contact dermatitis  - Worsening erythematous papules on the back, no pustules or vesicles noted  - Hydrating ointments + benadryl cream for itching  - DC hydrocortisone cream as he has used it for approximately 1 week   Dispo: Anticipated discharge in approximately 1-2 day(s).   Ina Homes, MD 06/07/2017, 10:35 AM My Pager: [phone]

## 2017-06-07 NOTE — Progress Notes (Signed)
Nutrition Follow-up  DOCUMENTATION CODES:   Non-severe (moderate) malnutrition in context of chronic illness  INTERVENTION:   -Continue Ensure Enlive po BID, each supplement provides 350 kcal and 20 grams of protein  NUTRITION DIAGNOSIS:   Malnutrition (Moderate) related to chronic illness (CKD stage 3) as evidenced by moderate depletion of body fat, moderate depletions of muscle mass.  Ongoing  GOAL:   Patient will meet greater than or equal to 90% of their needs  Progressing  MONITOR:   PO intake, Supplement acceptance, Weight trends  REASON FOR ASSESSMENT:   Consult Assessment of nutrition requirement/status  ASSESSMENT:   Pt with PMH of CKD stage 3, Prostate Ca, CAD, HTN. Presents this admission with AMS, dysuria, and FTT.   Pt sleeping soundly at time of visit. No family present. RD did not disturb.   Per chart review, meal intake has improved since admission. Noted meal completion 25-75%, averaging around 50% of meals. Pt is also accepting Ensure supplements. Recommend continue supplements at SNF to optimize nutritional status.  Reviewed CSW notes; pt is medically ready for discharge, however, SNF needed due to deconditioning from UTI. CSW working with family on bed search.  Labs reviewed: Na: 134.   Diet Order:  Diet regular Room service appropriate? Yes; Fluid consistency: Thin  Skin:   (open wound left hand)  Last BM:  06/06/17  Height:   Ht Readings from Last 1 Encounters:  06/01/17 5\' 8"  (1.727 m)    Weight:   Wt Readings from Last 1 Encounters:  06/01/17 180 lb (81.6 kg)    Ideal Body Weight:  70 kg  BMI:  Body mass index is 27.37 kg/m.  Estimated Nutritional Needs:   Kcal:  1650-1850  Protein:  85-100 grams  Fluid:  1.7-1.9 L  EDUCATION NEEDS:   No education needs identified at this time  Taelyr Jantz A. Jimmye Norman, RD, LDN, CDE Pager: [phone] After hours Pager: [phone]

## 2017-06-07 NOTE — Clinical Social Work Note (Signed)
[facility] will not take pt at d/c. CSW spoke in length on the phone with pt's daughter. Pt's daughter states she would be interested in a private room at [facility] for her father, however, [facility] does not take [facility]. Pt's daughter is discouraged. Pt's daughter is going to continue to search for facilities and will speak with CSW in the AM.  [place], [place]

## 2017-06-07 NOTE — Progress Notes (Signed)
Discussed with social worker about placement today. Again was turned down by [facility]. Turned down by [facility] as well. Daughter is discouraged. 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 Outpatient visit is scheduled for Thursday (06/09/2017). Have called [facility] (Dr. Matilde Sprang on call), awaiting response. Also received call from Dr. Noemi Chapel (Family friend and orthopedic specialist), discussed that placement as been an issue of insurance coverage as opposed to diagnosis.

## 2017-06-07 NOTE — Progress Notes (Signed)
Internal Medicine Attending:   I saw and examined the patient. I reviewed the resident's note and I agree with the resident's findings and plan as documented in the resident's note.  Patient feels well today with no new complaints. He still has some suprapubic pain but only when he needs to urinate. He was initially admitted for a complicated UTI and was found to have Enterococcus faecalis in his urine. He was initially treated with IV vancomycin for 3 days and has now been transitioned to fosfomycin. His oral intake has improved substantially but we'll continue with IV fluids for now. Can DC IV fluids if he continues to eat appropriately. Patient was initially scheduled to be discharged to SNF today but this was a facility is now denying his acceptance. We will follow-up with social work. Patient is stable for discharge to rehabilitation once bed is available.

## 2017-06-08 ENCOUNTER — Other Ambulatory Visit: Payer: Medicare Other

## 2017-06-08 MED ORDER — DIPHENHYDRAMINE-ZINC ACETATE 2-0.1 % EX CREA: TOPICAL_CREAM | Freq: Three times a day (TID) | CUTANEOUS | 0 refills | Status: DC | PRN

## 2017-06-08 MED ORDER — ENSURE ENLIVE PO LIQD: 237.0000 mL | Freq: Two times a day (BID) | ORAL | 12 refills | Status: DC

## 2017-06-08 MED ORDER — PRIMIDONE 50 MG PO TABS: 50.0000 mg | ORAL_TABLET | Freq: Two times a day (BID) | ORAL | 2 refills | Status: DC

## 2017-06-08 MED ORDER — SODIUM CHLORIDE 0.9 % IV SOLN
INTRAVENOUS | Status: DC
  Administered 2017-06-08: 07:00:00 via INTRAVENOUS

## 2017-06-08 MED ORDER — FOSFOMYCIN TROMETHAMINE 3 G PO PACK: 3.0000 g | PACK | ORAL | 0 refills | Status: DC

## 2017-06-08 NOTE — Progress Notes (Signed)
Internal Medicine Attending:   I saw and examined the patient. I reviewed the resident's note and I agree with the resident's findings and plan as documented in the resident's note.  Patient feels well this morning with no new complaints. He still has intermittent suprapubic abdominal pain. He does appear fatigued today and states that he would like to leave when possible. Patient was admitted for a complicated urinary tract infection/cystitis secondary to Enterococcus faecalis. He finished 3 days of IV vancomycin and was transitioned to oral fosfomycin. Urology consult and recommendations appreciated. We'll leave Foley in place and schedule follow-up with urology in 2 weeks. Patient may need to be placed on prophylactic antibiotics to prevent recurrent urinary tract infections. He was reevaluated by PT today and they recommended he be discharged to SNF for rehabilitation. Patient is stable for discharge to SNF once bed available.

## 2017-06-08 NOTE — NC FL2 (Signed)
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, [place] [address] [place], [place] [address]      Provider Number: [id number]  Attending Physician Name and Address:  Aldine Contes, MD  Relative Name and Phone Number:       Current Level of Care: Hospital Recommended Level of Care: [place] Prior Approval Number:    Date Approved/Denied:   PASRR Number: [id number]  Discharge Plan: SNF    Current Diagnoses: Patient Active Problem List   Diagnosis Date Noted  . Weakness 06/06/2017  . Complicated UTI (urinary tract infection) 06/01/2017  . Abdominal pain 06/01/2017  . Altered mental status 06/01/2017  . Acute bronchitis treated with antibiotics in the past 60 days 05/25/2017  . Atrial flutter ([place]) 02/03/2017  . Elevated blood pressure 10/27/2015  . MGUS (monoclonal gammopathy of unknown significance) 06/26/2015  . Essential tremor 06/25/2015  . Right bundle branch block 04/16/2015  . On amiodarone therapy 04/16/2015  . Chronic anticoagulation 01/27/2015  . Clavicle fracture 12/16/2014  . [place] I fracture of maxilla ([place]) 06/22/2014  . Facial fracture due to fall ([place]) 06/07/2014  . Rotator cuff tear, right 01/23/2014  . GERD (gastroesophageal reflux disease)   . Skin lesion of back 12/24/2013  . Memory change   . Leg fatigue   . Bradycardia   . Carotid artery disease ([place])   . Painless hematuria 03/27/2012  . Prostate cancer ([place]) 11/22/2011  . Atrial fibrillation ([place])   . Coronary artery disease   . Hyperlipidemia   . Monoclonal gammopathy   . TIA (transient ischemic attack)   . S/P transurethral resection of prostate   . Cervical spine disease   . Renal insufficiency   . Aortic stenosis   .  Aortic insufficiency     Orientation RESPIRATION BLADDER Height & Weight     Self, Situation, Place  Normal Continent Weight: 180 lb (81.6 kg) Height:  5\' 8"  (172.7 cm)  BEHAVIORAL SYMPTOMS/MOOD NEUROLOGICAL BOWEL NUTRITION STATUS   (None)  (None) Continent  (Please see d/c summary)  AMBULATORY STATUS COMMUNICATION OF NEEDS Skin   Extensive Assist Verbally Skin abrasions (Open wound, skin tear on left hand.)                       Personal Care Assistance Level of Assistance  Bathing, Feeding, Dressing Bathing Assistance: Maximum assistance Feeding assistance: Limited assistance Dressing Assistance: Maximum assistance     Functional Limitations Info  Sight, Hearing, Speech Sight Info: Adequate Hearing Info: Adequate Speech Info: Adequate    SPECIAL CARE FACTORS FREQUENCY  PT (By licensed PT), OT (By licensed OT)     PT Frequency: 2x week OT Frequency: 2x week            Contractures Contractures Info: Not present    Additional Factors Info  Code Status, Allergies Code Status Info: DNR Allergies Info: Sulfa Antibiotics, Chlorhexidine, Guaifenesin & Derivatives, Keflex Cephalexin, Oxycodone, Oxycontin Oxycodone Hcl, Penicillins           Current Medications (06/08/2017):  This is the current hospital active medication list Current Facility-Administered Medications  Medication Dose Route Frequency Provider Last Rate Last Dose  . 0.9 %  sodium chloride infusion   Intravenous Continuous Ina Homes, MD 100  mL/hr at 06/08/17 9150    . acetaminophen (TYLENOL) tablet 650 mg  650 mg Oral Q6H PRN Shela Leff, MD       Or  . acetaminophen (TYLENOL) suppository 650 mg  650 mg Rectal Q6H PRN Shela Leff, MD      . amiodarone (PACERONE) tablet 200 mg  200 mg Oral Daily Shela Leff, MD   200 mg at 06/07/17 1008  . apixaban (ELIQUIS) tablet 2.5 mg  2.5 mg Oral BID Shela Leff, MD   2.5 mg at 06/07/17 2107  . aspirin EC tablet 81 mg  81 mg  Oral Q M,W,F Shela Leff, MD   81 mg at 06/06/17 0804  . atorvastatin (LIPITOR) tablet 10 mg  10 mg Oral Daily Shela Leff, MD   10 mg at 06/07/17 1008  . benzonatate (TESSALON) capsule 100 mg  100 mg Oral Q8H PRN Shela Leff, MD   100 mg at 06/01/17 1833  . diphenhydrAMINE-zinc acetate (BENADRYL) 2-0.1 % cream   Topical TID PRN Ina Homes, MD      . feeding supplement (ENSURE ENLIVE) (ENSURE ENLIVE) liquid 237 mL  237 mL Oral BID BM Dareen Piano, [place], MD   237 mL at 06/07/17 1450  . fosfomycin (MONUROL) packet 3 g  3 g Oral Q3 days Ina Homes, MD   3 g at 06/07/17 1013  . HYDROcodone-acetaminophen (NORCO/VICODIN) 5-325 MG per tablet 1 tablet  1 tablet Oral Q6H PRN Welford Roche, MD   1 tablet at 06/08/17 0158  . hydrocortisone cream 1 %   Topical TID PRN Shela Leff, MD   1 application at 56/97/94 2213  . primidone (MYSOLINE) tablet 50 mg  50 mg Oral BID Ina Homes, MD         Discharge Medications: Please see discharge summary for a list of discharge medications.  Relevant Imaging Results:  Relevant Lab Results:   Additional Information SSN: [id number]  Eileen Stanford, LCSW

## 2017-06-08 NOTE — Care Management Important Message (Signed)
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

## 2017-06-08 NOTE — Clinical Social Work Placement (Signed)
   CLINICAL SOCIAL WORK PLACEMENT  NOTE  Date:  06/08/2017  Patient Details  Name: [name] ANGELL, MD MRN: [id number] Date of Birth: 07-06-1925  Clinical Social Work is seeking post-discharge placement for this patient at the [place] level of care (*CSW will initial, date and re-position this form in  chart as items are completed):  Yes   Patient/family provided with [facility] Work Department's list of facilities offering this level of care within the geographic area requested by the patient (or if unable, by the patient's family).  Yes   Patient/family informed of their freedom to choose among providers that offer the needed level of care, that participate in Medicare, Medicaid or managed care program needed by the patient, have an available bed and are willing to accept the patient.  Yes   Patient/family informed of [place]'s ownership interest in [facility] and [facility], as well as of the fact that they are under no obligation to receive care at these facilities.  PASRR submitted to EDS on 06/04/17     PASRR number received on 06/04/17     Existing PASRR number confirmed on       FL2 transmitted to all facilities in geographic area requested by pt/family on 06/04/17     FL2 transmitted to all facilities within larger geographic area on       Patient informed that his/her managed care company has contracts with or will negotiate with certain facilities, including the following:        Yes   Patient/family informed of bed offers received.  Patient chooses bed at [facility]     Physician recommends and patient chooses bed at      Patient to be transferred to [facility] on 06/08/17.  Patient to be transferred to facility by PTAR     Patient family notified on 06/08/17 of transfer.  Name of family member notified:  [name]     PHYSICIAN Please sign FL2, Please sign DNR     Additional Comment:     _______________________________________________ Eileen Stanford, LCSW 06/08/2017, 1:47 PM

## 2017-06-08 NOTE — Progress Notes (Signed)
Pt's daughter is here, was able to talk to Dr Tarri Abernethy. Pt is discharged today via [facility]  to [facility].

## 2017-06-08 NOTE — Clinical Social Work Note (Signed)
CSW spoke with [facility] and they will take the pt today. [facility] decision still pending. CSW called and updated pt's daughter. Pt's daughter will call back with a decision.   [place], [place]

## 2017-06-08 NOTE — Progress Notes (Signed)
   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 Still having intermittent suprapubic pain. Discussed the plan to continue IVF, urology's recommendation, and still working on placement. Requests that PT come by today. Will await to hear about placement today.   Objective: Vital signs in last 24 hours: Vitals:   06/07/17 0611 06/07/17 1500 06/07/17 2149 06/08/17 0512  BP: (!) 143/63 (!) 150/62 (!) 161/65 (!) 156/64  Pulse: (!) 55 (!) 57 (!) 55 (!) 54  Resp:      Temp: 98 F (36.7 C) 98.2 F (36.8 C) 98.7 F (37.1 C) 98.1 F (36.7 C)  TempSrc: Oral Oral Oral Oral  SpO2: 94% 97% 94% 96%  Weight:      Height:       Physical Exam  Constitutional: No distress.  Lying in bed comfortably  HENT:  Head: Normocephalic and atraumatic.  Eyes: Pupils are equal, round, and reactive to light. Conjunctivae are normal.  Cardiovascular: Normal rate, regular rhythm and intact distal pulses.   Murmur (AS) heard. Pulmonary/Chest: Effort normal and breath sounds normal. He has no wheezes. He has no rales.  Abdominal: Soft. Bowel sounds are normal. There is no tenderness. There is no rebound.  Musculoskeletal: He exhibits no edema.  Neurological: He is alert.   Assessment/Plan: Principal Problem:   Complicated UTI (urinary tract infection) Active Problems:   Renal insufficiency   Abdominal pain   Altered mental status   Weakness  1. Cystitis/Complicated UTI - Urine cultures +Enterococcus Faecalis, sensitive to Ampicillin, Vancomycin, and Levofloxacin  - Continue Fosfomycin 3g every 3 days (1st of 3 doses) - Urology consult; leave foley in place, follow-up in 2 weeks, may need prophylactic antibiotics  - Continues to have intermittent suprapubic pain with urge   2. Generalized weakness  - Likely due to deconditioning 2/2 complicated UTI  - PT/OT recommending SNF  - Case management and Social work involved, appreciate the help  - Waiting on placement  3. Contact dermatitis  -  Worsening erythematous papules on the back, no pustules or vesicles noted  - Hydrating ointments + benadryl cream for itching  - Itching improved from days prior   Dispo: Anticipated discharge in approximately 1-2 day(s).   Ina Homes, MD 06/08/2017, 10:26 AM My Pager: [phone]

## 2017-06-08 NOTE — Clinical Social Work Note (Signed)
Clinical Social Worker facilitated patient discharge including contacting patient family and facility to confirm patient discharge plans.  Clinical information faxed to facility and family agreeable with plan.  CSW arranged ambulance transport via [facility] to [facility] .  RN to call [phone] for report prior to discharge.  Clinical Social Worker will sign off for now as social work intervention is no longer needed. Please consult [place] again if new need arises.  [place], [place]

## 2017-06-08 NOTE — Progress Notes (Signed)
Physical Therapy Treatment Patient Details Name: [name] HOSKING, MD MRN: [id number] DOB: Jul 16, 1925 Today's Date: 06/08/2017    History of Present Illness Patient is a 83 y/o male who presents with progressive weakness and dysuria that has acutely worsened over the last 4 days. Concern for cystitis. Renal US illustrated left perirenal fluid and a 108mm cyst on the right kidney. PMH includes CKD stage III, MGUS, Prostate cancer, TIA, HLD, CAD, A-fib, aortic stenosis, MR.     PT Comments    Pt with very depressed mood today. Transferred out of bed to chair with +2 max A with therapist in front of pt and +2 assist behind. Pt continues to be very weak and unable to ambulate, also limited by pain and achiness, especially UE's. PT will continue to follow.    Follow Up Recommendations  SNF;Supervision for mobility/OOB;Supervision/Assistance - 24 hour     Equipment Recommendations  None recommended by PT    Recommendations for Other Services       Precautions / Restrictions Precautions Precautions: Fall Restrictions Weight Bearing Restrictions: No    Mobility  Bed Mobility Overal bed mobility: Needs Assistance Bed Mobility: Supine to Sit Rolling: Max assist;+2 for physical assistance   Supine to sit: Max assist;+2 for physical assistance     General bed mobility comments: pt needs assist initiating movement. Max A to roll to R, pt achy with all movement. Max A +2 for legs off bed and elevation of trunk to sitting.   Transfers Overall transfer level: Needs assistance Equipment used: None Transfers: Sit to/from [facility] Sit to Stand: Max assist;+2 physical assistance Stand pivot transfers: Max assist;+2 physical assistance       General transfer comment: therapist directly in fron of pt with +2 assist at pt's hips from behind. Max A for power up. Pt took small pivot steps toward recliner, was unable to step bkwds, recliner had to be brought up to him before  sitting. Mod A for controlling sitting  Ambulation/Gait             General Gait Details: pt unable due to weakness   Stairs            Wheelchair Mobility    Modified Rankin (Stroke Patients Only)       Balance Overall balance assessment: Needs assistance;History of Falls Sitting-balance support: Single extremity supported;Feet supported Sitting balance-Leahy Scale: Poor Sitting balance - Comments: min A to maintain sitting initally, but able to progress to min-guard after a couple of minutes and assist with getting feet flat on floor Postural control: Posterior lean Standing balance support: During functional activity;Bilateral upper extremity supported Standing balance-Leahy Scale: Zero Standing balance comment: maintained standing x3 mins during transfer                            Cognition Arousal/Alertness: Awake/alert Behavior During Therapy: Flat affect Overall Cognitive Status: No family/caregiver present to determine baseline cognitive functioning                                 General Comments: HOH, needed to repeat commands occasionally but appropriate throughout session. He was depressed today and lamenting that he is still here.       Exercises General Exercises - Lower Extremity Ankle Circles/Pumps: AROM;Both;20 reps;Seated    General Comments General comments (skin integrity, edema, etc.): pt supplied with warm blanket after  session which did provide some comfort on a hard day for him      Pertinent Vitals/Pain Pain Assessment: Faces Faces Pain Scale: Hurts even more Pain Location: LUE/ wrist Pain Descriptors / Indicators: Sore;Discomfort Pain Intervention(s): Limited activity within patient's tolerance;Monitored during session    Home Living                      Prior Function            PT Goals (current goals can now be found in the care plan section) Acute Rehab PT Goals Patient Stated Goal:  none stated PT Goal Formulation: With patient Time For Goal Achievement: 06/16/17 Potential to Achieve Goals: Fair Progress towards PT goals: Progressing toward goals    Frequency    Min 2X/week      PT Plan Current plan remains appropriate    Co-evaluation              AM-PAC PT "6 Clicks" Daily Activity  Outcome Measure  Difficulty turning over in bed (including adjusting bedclothes, sheets and blankets)?: Total Difficulty moving from lying on back to sitting on the side of the bed? : Total Difficulty sitting down on and standing up from a chair with arms (e.g., wheelchair, bedside commode, etc,.)?: Total Help needed moving to and from a bed to chair (including a wheelchair)?: Total Help needed walking in hospital room?: Total Help needed climbing 3-5 steps with a railing? : Total 6 Click Score: 6    End of Session Equipment Utilized During Treatment: Gait belt Activity Tolerance: Patient limited by fatigue Patient left: in chair;with call bell/phone within reach;with chair alarm set Nurse Communication: Mobility status;Need for lift equipment PT Visit Diagnosis: Muscle weakness (generalized) (M62.81);Unsteadiness on feet (R26.81);Difficulty in walking, not elsewhere classified (R26.2)     Time: 7414-2395 PT Time Calculation (min) (ACUTE ONLY): 19 min  Charges:  $Therapeutic Activity: 8-22 mins                    G Codes:       [place]  [place] 06/08/2017, 12:12 PM

## 2017-06-12 NOTE — ED Provider Notes (Signed)
[name] [name] DEPT Provider Note   CSN: [id number] Arrival date & time: 06/01/17  1444     History   Chief Complaint Chief Complaint  Patient presents with  . Urinary Retention    HPI [name] [name], [name] [name] is a 83 y.o. male.  HPI   89yM sent for admit but bed not immediately available. Progressive decline over the past days to weeks. Recent treatment for bronchitis and didn't seem to improve after. Continues to cough. Generally weak. Urinary retention.   Past Medical History:  Diagnosis Date  . Acute bronchitis treated with antibiotics in the past 60 days 05/25/2017   Persistent cough despite two courses of antibiotics 05/25/17  . Anemia   . Aortic insufficiency    Mild, echo, July, 2011  . Aortic stenosis    Mild, echo, July, 2011  . Atrial fibrillation [facility]) 2011, 2015   Multaq Started September, 2011, dose adjusted October, 2011, 200 mg a.m., 400 mg p.o.  . Bradycardia    Sinus bradycardia while on 25 Lopressor twice a day October, 2013  . Carotid artery disease ([place])    Doppler, 2008 no significant abnormality  . Cervical spine disease   . Chronic anticoagulation 01/27/2015   pradaxa 75 mg BID for A fib  . Coronary artery disease    minimal, cath 2005 / catheterization August, 2011, 30% in 3 vessels, normal LV function  . Diarrhea    Chronic  . Drug therapy    Pradaxa Started July, 2011  . Ejection fraction    EF 60%, echo, 2009  /  TEE normal August, 2011 /   EF 60%, echo, July, 2011  . GERD (gastroesophageal reflux disease)    Esophageal dilatation in 1992, some esophageal spasm  . Hyperlipidemia   . Incomplete right bundle branch block    August, 2012  . Leg fatigue    June, 2014  . Memory change    June, 2014  . Monoclonal gammopathy    granfortuna  . MR (mitral regurgitation) 2009   mild, Echo, July, 2011  . Painless hematuria 03/27/2012   Occurred x 2 5/12 & 5/13 on Pradaxa 75 mg BID  . Prostate cancer ([place]) 11/22/2011  . Renal insufficiency    Prior creatinine 1.2 /  September, 2011.. 1.7  /  October, 2011.. 1.8  . S/P transurethral resection of prostate 2004   2004  . TIA (transient ischemic attack)    Dr. Erling Cruz,, question in the past. when off ASA for 10 days    Patient Active Problem List   Diagnosis Date Noted  . Weakness 06/06/2017  . Complicated UTI (urinary tract infection) 06/01/2017  . Abdominal pain 06/01/2017  . Altered mental status 06/01/2017  . Acute bronchitis treated with antibiotics in the past 60 days 05/25/2017  . Atrial flutter ([place]) 02/03/2017  . Elevated blood pressure 10/27/2015  . MGUS (monoclonal gammopathy of unknown significance) 06/26/2015  . Essential tremor 06/25/2015  . Right bundle branch block 04/16/2015  . On amiodarone therapy 04/16/2015  . Chronic anticoagulation 01/27/2015  . Clavicle fracture 12/16/2014  . [place] I fracture of maxilla ([place]) 06/22/2014  . Facial fracture due to fall ([place]) 06/07/2014  . Rotator cuff tear, right 01/23/2014  . GERD (gastroesophageal reflux disease)   . Skin lesion of back 12/24/2013  . Memory change   . Leg fatigue   . Bradycardia   . Carotid artery disease ([place])   . Painless hematuria 03/27/2012  . Prostate cancer ([place]) 11/22/2011  . Atrial  fibrillation ([place])   . Coronary artery disease   . Hyperlipidemia   . Monoclonal gammopathy   . TIA (transient ischemic attack)   . S/P transurethral resection of prostate   . Cervical spine disease   . Renal insufficiency   . Aortic stenosis   . Aortic insufficiency     Past Surgical History:  Procedure Laterality Date  . CARDIAC CATHETERIZATION    . CARDIOVERSION  08/01/2012   Procedure: CARDIOVERSION;  Surgeon: Carlena Bjornstad, [name] [name];  Location[facility] ENDOSCOPY;  Service: Cardiovascular;  Laterality: N/A;  . CARDIOVERSION N/A 11/29/2013   Procedure: CARDIOVERSION;  Surgeon: Carlena Bjornstad, [name] [name];  Location: [facility] ENDOSCOPY;  Service: Cardiovascular;  Laterality: N/A;  . CARDIOVERSION N/A 11/27/2014   Procedure:  CARDIOVERSION;  Surgeon: Carlena Bjornstad, [name] [name];  Location: [facility] ENDOSCOPY;  Service: Cardiovascular;  Laterality: N/A;  . CARDIOVERSION N/A 03/12/2015   Procedure: CARDIOVERSION;  Surgeon: Carlena Bjornstad, [name] [name];  Location: [place];  Service: Cardiovascular;  Laterality: N/A;  . CARDIOVERSION N/A 12/12/2015   Procedure: CARDIOVERSION;  Surgeon: Fay Records, [name] [name];  Location: [place];  Service: Cardiovascular;  Laterality: N/A;  . CARDIOVERSION N/A 02/24/2017   Procedure: CARDIOVERSION;  Surgeon: Pixie Casino, [name] [name];  Location: [facility] ENDOSCOPY;  Service: Cardiovascular;  Laterality: N/A;  . CATARACT EXTRACTION    . COLONOSCOPY    . EYE SURGERY Bilateral    cataract extraction  . Fibular fracture repair    . HERNIA REPAIR    . History of TURP    . OPEN REDUCTION INTERNAL FIXATION (ORIF) METACARPAL Left 06/22/2014   Procedure: OPEN REDUCTION INTERNAL FIXATION (ORIF) LEFT HAND METACARPAL;  Surgeon: Roseanne Kaufman, [name] [name];  Location: [place];  Service: Orthopedics;  Laterality: Left;  . ORIF ORBITAL FRACTURE Bilateral 06/22/2014   Procedure: OPEN REDUCTION INTERNAL FIXATION (ORIF) BILATERAL LEFORTE1 FRACTURE OF MAXILLA, HYBRID ARCH BARS ;  Surgeon: Izora Gala, [name] [name];  Location: [place];  Service: ENT;  Laterality: Bilateral;  . SHOULDER ARTHROSCOPY WITH ROTATOR CUFF REPAIR Right 01/23/2014   DR Noemi Chapel   . SHOULDER ARTHROSCOPY WITH ROTATOR CUFF REPAIR Right 01/23/2014   Procedure: RIGHT SHOULDER ARTHROSCOPY WITH DEBRIDEMENT AND ROTATOR CUFF REPAIR;  Surgeon: Lorn Junes, [name] [name];  Location: [place];  Service: Orthopedics;  Laterality: Right;       Home Medications    Prior to Admission medications   Medication Sig Start Date End Date Taking? Authorizing Provider  amiodarone (PACERONE) 200 MG tablet Take 1 tablet (200 mg total) by mouth daily. 02/21/17  Yes Lelon Perla, [name] [name]  aspirin EC 81 MG tablet Take 81 mg by mouth every Monday, Wednesday, and Friday.   Yes Provider, Historical, [name] [name]  atorvastatin (LIPITOR) 10  MG tablet TAKE 1 TABLET BY MOUTH ONCE DAILY Patient taking differently: TAKE 10MG  BY MOUTH ONCE DAILY 05/19/17  Yes Axel Filler, [name] [name]  benzonatate (TESSALON PERLES) 100 MG capsule Take 1 capsule (100 mg total) by mouth every 8 (eight) hours as needed for cough. 05/25/17 05/25/18 Yes Annia Belt, [name] [name]  ELIQUIS 2.5 MG TABS tablet TAKE 1 TABLET BY MOUTH 2 TIMES DAILY Patient taking differently: TAKE 2.5MG  BY MOUTH 2 TIMES DAILY 12/22/16  Yes Annia Belt, [name] [name]  diphenhydrAMINE-zinc acetate (BENADRYL) cream Apply topically 3 (three) times daily as needed for itching. 06/08/17   Ina Homes, [name] [name]  feeding supplement, ENSURE ENLIVE, (ENSURE ENLIVE) LIQD Take 237 mLs by mouth 2 (two) times daily between meals. 06/08/17   Ina Homes, [name] [name]  fosfomycin (MONUROL)  3 g PACK Take 3 g by mouth every 3 (three) days. 06/10/17   Ina Homes, [name] [name]  primidone (MYSOLINE) 50 MG tablet Take 1 tablet (50 mg total) by mouth 2 (two) times daily at 10 AM and 5 PM. 06/08/17   Ina Homes, [name] [name]    Family History No family history on file.  Social History Social History  Substance Use Topics  . Smoking status: Former Research scientist (life sciences)  . Smokeless tobacco: Never Used  . Alcohol use 1.2 oz/week    1 Glasses of wine, 1 Shots of liquor per week     Comment: Daily     Allergies   Sulfa antibiotics; Chlorhexidine; Guaifenesin & derivatives; Keflex [cephalexin]; Oxycodone; Oxycontin [oxycodone hcl]; and Penicillins   Review of Systems Review of Systems  All systems reviewed and negative, other than as noted in HPI.  Physical Exam Updated Vital Signs BP (!) 170/66 (BP Location: Right Arm)   Pulse (!) 57   Temp 98.8 F (37.1 C) (Oral)   Resp 16   Ht 5\' 8"  (1.727 m)   Wt 81.6 kg (180 lb)   SpO2 98%   BMI 27.37 kg/m   Physical Exam  Constitutional: He appears well-developed and well-nourished. No distress.  HENT:  Head: Normocephalic and atraumatic.  Eyes: Conjunctivae are normal. Right eye  exhibits no discharge. Left eye exhibits no discharge.  Neck: Neck supple.  Cardiovascular: Normal rate, regular rhythm and normal heart sounds.  Exam reveals no gallop and no friction rub.   No murmur heard. Pulmonary/Chest: Effort normal and breath sounds normal. No respiratory distress.  Abdominal: Soft. He exhibits no distension. There is no tenderness.  Musculoskeletal: He exhibits no edema or tenderness.  Neurological: He is alert.  Skin: Skin is warm and dry.  Psychiatric: Thought content normal.  Drowsy but opens eyes to voice and answering questions appropriately  Nursing note and vitals reviewed.    ED Treatments / Results  Labs (all labs ordered are listed, but only abnormal results are displayed) Labs Reviewed  URINE CULTURE - Abnormal; Notable for the following:       Result Value   Culture 80,000 COLONIES/mL ENTEROCOCCUS FAECALIS (*)    Organism ID, Bacteria ENTEROCOCCUS FAECALIS (*)    All other components within normal limits  URINALYSIS, ROUTINE W REFLEX MICROSCOPIC - Abnormal; Notable for the following:    APPearance HAZY (*)    Hgb urine dipstick MODERATE (*)    Protein, ur 30 (*)    Leukocytes, UA TRACE (*)    Bacteria, UA RARE (*)    Squamous Epithelial / LPF 0-5 (*)    All other components within normal limits  CBC WITH DIFFERENTIAL/PLATELET - Abnormal; Notable for the following:    RBC 3.24 (*)    Hemoglobin 10.6 (*)    HCT 30.1 (*)    Platelets 103 (*)    All other components within normal limits  BASIC METABOLIC PANEL - Abnormal; Notable for the following:    Sodium 128 (*)    CO2 21 (*)    BUN 32 (*)    Creatinine, Ser 1.50 (*)    Calcium 8.5 (*)    GFR calc non Af Amer 39 (*)    GFR calc Af Amer 45 (*)    All other components within normal limits  COMPREHENSIVE METABOLIC PANEL - Abnormal; Notable for the following:    Sodium 131 (*)    Glucose, Bld 114 (*)    BUN 32 (*)    Creatinine,  Ser 1.54 (*)    Calcium 8.2 (*)    Total Protein 6.1  (*)    Albumin 2.1 (*)    AST 180 (*)    ALT 175 (*)    GFR calc non Af Amer 38 (*)    GFR calc Af Amer 44 (*)    Anion gap 4 (*)    All other components within normal limits  CBC - Abnormal; Notable for the following:    RBC 3.05 (*)    Hemoglobin 10.0 (*)    HCT 28.6 (*)    Platelets 98 (*)    All other components within normal limits  BASIC METABOLIC PANEL - Abnormal; Notable for the following:    Sodium 127 (*)    CO2 21 (*)    Glucose, Bld 120 (*)    BUN 32 (*)    Creatinine, Ser 1.56 (*)    Calcium 8.4 (*)    GFR calc non Af Amer 37 (*)    GFR calc Af Amer 43 (*)    Anion gap 4 (*)    All other components within normal limits  BASIC METABOLIC PANEL - Abnormal; Notable for the following:    Sodium 130 (*)    Glucose, Bld 100 (*)    BUN 25 (*)    Creatinine, Ser 1.51 (*)    Calcium 8.2 (*)    GFR calc non Af Amer 39 (*)    GFR calc Af Amer 45 (*)    All other components within normal limits  CBC - Abnormal; Notable for the following:    RBC 3.09 (*)    Hemoglobin 9.9 (*)    HCT 28.7 (*)    Platelets 109 (*)    All other components within normal limits  BASIC METABOLIC PANEL - Abnormal; Notable for the following:    Sodium 134 (*)    Creatinine, Ser 1.28 (*)    Calcium 7.9 (*)    GFR calc non Af Amer 48 (*)    GFR calc Af Amer 55 (*)    Anion gap 3 (*)    All other components within normal limits  OSMOLALITY, URINE  SODIUM, URINE, RANDOM  OSMOLALITY  MAGNESIUM  PHOSPHORUS  TSH  PSA    EKG  EKG Interpretation  Date/Time:  Wednesday June 01 2017 16:24:33 EDT Ventricular Rate:  66 PR Interval:    QRS Duration: 158 QT Interval:  449 QTC Calculation: 471 R Axis:   -65 Text Interpretation:  Sinus rhythm RBBB and LAFB No significant change since last tracing Confirmed by Gareth Morgan [phone]) on 06/02/2017 10:20:46 PM       Radiology No results found.  Procedures Procedures (including critical care time)  Medications Ordered in  ED Medications  0.9 %  sodium chloride infusion ( Intravenous New Bag/Given 06/02/17 0428)  HYDROmorphone (DILAUDID) injection 0.5 mg (0.5 mg Intravenous Given 06/04/17 0047)     Initial Impression / Assessment and Plan / ED Course  I have reviewed the triage vital signs and the nursing notes.  Pertinent labs & imaging results that were available during my care of the patient were reviewed by me and considered in my medical decision making (see chart for details).     89yM with generalized weakness. Likely multifactorial. Admit.   Final Clinical Impressions(s) / ED Diagnoses   Final diagnoses:  Suprapubic discomfort  Cough, persistent    New Prescriptions Discharge Medication List as of 06/08/2017  3:02 PM    START  taking these medications   Details  diphenhydrAMINE-zinc acetate (BENADRYL) cream Apply topically 3 (three) times daily as needed for itching., Starting Wed 06/08/2017, Normal    feeding supplement, ENSURE ENLIVE, (ENSURE ENLIVE) LIQD Take 237 mLs by mouth 2 (two) times daily between meals., Starting Wed 06/08/2017, Normal    fosfomycin (MONUROL) 3 g PACK Take 3 g by mouth every 3 (three) days., Starting Fri 06/10/2017, Normal         Virgel Manifold, [name] [name] 06/12/17 [phone]

## 2017-06-29 ENCOUNTER — Other Ambulatory Visit: Payer: Self-pay | Admitting: Oncology

## 2017-06-30 NOTE — Progress Notes (Deleted)
HPI: Follow-up paroxysmal atrial fibrillation. Had cardiac catheterization in August 2011 that showed nonobstructive coronary disease. Abdominal ultrasound July 2014 showedno aneurysm. Echocardiogram repeated 2/18. LV function normal; mild AS, mild to moderate MR, moderate LAE and mild to moderate AI. Patient had successful cardioversion again on 02/24/2017. Patient recently admitted in July 1607 with complicated urinary tract infection and altered mental status as well as weakness. Chest CT July 2018 showed coronary calcification and aortic valve calcification. 0.4 cm right upper lobe pulmonary nodule and noncontrast chest CT could be considered in 12 months. Since last seen,   Current Outpatient Prescriptions  Medication Sig Dispense Refill  . amiodarone (PACERONE) 200 MG tablet Take 1 tablet (200 mg total) by mouth daily.    Marland Kitchen aspirin EC 81 MG tablet Take 81 mg by mouth every Monday, Wednesday, and Friday.    Marland Kitchen atorvastatin (LIPITOR) 10 MG tablet TAKE 1 TABLET BY MOUTH ONCE DAILY (Patient taking differently: TAKE 10MG  BY MOUTH ONCE DAILY) 90 tablet 3  . benzonatate (TESSALON PERLES) 100 MG capsule Take 1 capsule (100 mg total) by mouth every 8 (eight) hours as needed for cough. 30 capsule 1  . diphenhydrAMINE-zinc acetate (BENADRYL) cream Apply topically 3 (three) times daily as needed for itching. 28.4 g 0  . ELIQUIS 2.5 MG TABS tablet TAKE 1 TABLET BY MOUTH 2 TIMES DAILY (Patient taking differently: TAKE 2.5MG  BY MOUTH 2 TIMES DAILY) 60 tablet 11  . feeding supplement, ENSURE ENLIVE, (ENSURE ENLIVE) LIQD Take 237 mLs by mouth 2 (two) times daily between meals. 237 mL 12  . fosfomycin (MONUROL) 3 g PACK Take 3 g by mouth every 3 (three) days. 6 g 0  . primidone (MYSOLINE) 50 MG tablet Take 1 tablet (50 mg total) by mouth 2 (two) times daily at 10 AM and 5 PM. 60 tablet 2   No current facility-administered medications for this visit.      Past Medical History:  Diagnosis Date  .  Acute bronchitis treated with antibiotics in the past 60 days 05/25/2017   Persistent cough despite two courses of antibiotics 05/25/17  . Anemia   . Aortic insufficiency    Mild, echo, July, 2011  . Aortic stenosis    Mild, echo, July, 2011  . Atrial fibrillation [facility]) 2011, 2015   Multaq Started September, 2011, dose adjusted October, 2011, 200 mg a.m., 400 mg p.o.  . Bradycardia    Sinus bradycardia while on 25 Lopressor twice a day October, 2013  . Carotid artery disease ([place])    Doppler, 2008 no significant abnormality  . Cervical spine disease   . Chronic anticoagulation 01/27/2015   pradaxa 75 mg BID for A fib  . Coronary artery disease    minimal, cath 2005 / catheterization August, 2011, 30% in 3 vessels, normal LV function  . Diarrhea    Chronic  . Drug therapy    Pradaxa Started July, 2011  . Ejection fraction    EF 60%, echo, 2009  /  TEE normal August, 2011 /   EF 60%, echo, July, 2011  . GERD (gastroesophageal reflux disease)    Esophageal dilatation in 1992, some esophageal spasm  . Hyperlipidemia   . Incomplete right bundle branch block    August, 2012  . Leg fatigue    June, 2014  . Memory change    June, 2014  . Monoclonal gammopathy    granfortuna  . MR (mitral regurgitation) 2009   mild, Echo, July, 2011  .  Painless hematuria 03/27/2012   Occurred x 2 5/12 & 5/13 on Pradaxa 75 mg BID  . Prostate cancer ([place]) 11/22/2011  . Renal insufficiency    Prior creatinine 1.2 /  September, 2011.. 1.7  /  October, 2011.. 1.8  . S/P transurethral resection of prostate 2004   2004  . TIA (transient ischemic attack)    Dr. Erling Cruz,, question in the past. when off ASA for 10 days    Past Surgical History:  Procedure Laterality Date  . CARDIAC CATHETERIZATION    . CARDIOVERSION  08/01/2012   Procedure: CARDIOVERSION;  Surgeon: Carlena Bjornstad, MD;  Location: [facility] ENDOSCOPY;  Service: Cardiovascular;  Laterality: N/A;  . CARDIOVERSION N/A 11/29/2013   Procedure:  CARDIOVERSION;  Surgeon: Carlena Bjornstad, MD;  Location: [facility] ENDOSCOPY;  Service: Cardiovascular;  Laterality: N/A;  . CARDIOVERSION N/A 11/27/2014   Procedure: CARDIOVERSION;  Surgeon: Carlena Bjornstad, MD;  Location: [facility] ENDOSCOPY;  Service: Cardiovascular;  Laterality: N/A;  . CARDIOVERSION N/A 03/12/2015   Procedure: CARDIOVERSION;  Surgeon: Carlena Bjornstad, MD;  Location: [place];  Service: Cardiovascular;  Laterality: N/A;  . CARDIOVERSION N/A 12/12/2015   Procedure: CARDIOVERSION;  Surgeon: Fay Records, MD;  Location: [place];  Service: Cardiovascular;  Laterality: N/A;  . CARDIOVERSION N/A 02/24/2017   Procedure: CARDIOVERSION;  Surgeon: Pixie Casino, MD;  Location: [facility] ENDOSCOPY;  Service: Cardiovascular;  Laterality: N/A;  . CATARACT EXTRACTION    . COLONOSCOPY    . EYE SURGERY Bilateral    cataract extraction  . Fibular fracture repair    . HERNIA REPAIR    . History of TURP    . OPEN REDUCTION INTERNAL FIXATION (ORIF) METACARPAL Left 06/22/2014   Procedure: OPEN REDUCTION INTERNAL FIXATION (ORIF) LEFT HAND METACARPAL;  Surgeon: Roseanne Kaufman, MD;  Location: [place];  Service: Orthopedics;  Laterality: Left;  . ORIF ORBITAL FRACTURE Bilateral 06/22/2014   Procedure: OPEN REDUCTION INTERNAL FIXATION (ORIF) BILATERAL LEFORTE1 FRACTURE OF MAXILLA, HYBRID ARCH BARS ;  Surgeon: Izora Gala, MD;  Location: [place];  Service: ENT;  Laterality: Bilateral;  . SHOULDER ARTHROSCOPY WITH ROTATOR CUFF REPAIR Right 01/23/2014   DR Noemi Chapel   . SHOULDER ARTHROSCOPY WITH ROTATOR CUFF REPAIR Right 01/23/2014   Procedure: RIGHT SHOULDER ARTHROSCOPY WITH DEBRIDEMENT AND ROTATOR CUFF REPAIR;  Surgeon: Lorn Junes, MD;  Location: [place];  Service: Orthopedics;  Laterality: Right;    Social History   Social History  . Marital status: Widowed    Spouse name: N/A  . Number of children: N/A  . 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  Smoking status: Former Research scientist (life sciences)  . Smokeless tobacco: Never Used  . Alcohol use 1.2 oz/week    1 Glasses of wine, 1 Shots of liquor per week     Comment: Daily  . Drug use: No  . Sexual activity: Not Currently   Other Topics Concern  . Not on file   Social History Narrative  . No narrative on file    No family history on file.  ROS: no fevers or chills, productive cough, hemoptysis, dysphasia, odynophagia, melena, hematochezia, dysuria, hematuria, rash, seizure activity, orthopnea, PND, pedal edema, claudication. Remaining systems are negative.  Physical Exam: Well-developed well-nourished in no acute distress.  Skin is warm and dry.  HEENT is normal.  Neck is supple.  Chest is clear to auscultation with normal expansion.  Cardiovascular  exam is regular rate and rhythm.  Abdominal exam nontender or distended. No masses palpated. Extremities show no edema. neuro grossly intact  ECG- personally reviewed  A/P  1  Kirk Ruths, MD

## 2017-07-05 ENCOUNTER — Ambulatory Visit: Payer: Medicare Other | Admitting: Cardiology

## 2017-08-01 ENCOUNTER — Encounter: Payer: Self-pay | Admitting: Oncology

## 2017-08-01 ENCOUNTER — Ambulatory Visit (INDEPENDENT_AMBULATORY_CARE_PROVIDER_SITE_OTHER): Payer: Medicare Other | Admitting: Oncology

## 2017-08-01 VITALS — BP 139/71 | HR 84 | Temp 98.1°F | Ht 68.0 in | Wt 180.3 lb

## 2017-08-01 DIAGNOSIS — N289 Disorder of kidney and ureter, unspecified: Secondary | ICD-10-CM

## 2017-08-01 DIAGNOSIS — G250 Essential tremor: Secondary | ICD-10-CM

## 2017-08-01 DIAGNOSIS — N390 Urinary tract infection, site not specified: Secondary | ICD-10-CM

## 2017-08-01 DIAGNOSIS — Z7901 Long term (current) use of anticoagulants: Secondary | ICD-10-CM

## 2017-08-01 DIAGNOSIS — C61 Malignant neoplasm of prostate: Secondary | ICD-10-CM | POA: Diagnosis not present

## 2017-08-01 DIAGNOSIS — I350 Nonrheumatic aortic (valve) stenosis: Secondary | ICD-10-CM | POA: Diagnosis not present

## 2017-08-01 DIAGNOSIS — J209 Acute bronchitis, unspecified: Secondary | ICD-10-CM

## 2017-08-01 DIAGNOSIS — D472 Monoclonal gammopathy: Secondary | ICD-10-CM | POA: Diagnosis not present

## 2017-08-01 NOTE — Patient Instructions (Signed)
Lab in March 2019  MD visit 1 week after lab

## 2017-08-01 NOTE — Progress Notes (Signed)
Hematology and Oncology Follow Up Visit  [name] NAKAMA, MD [id number] 1925-03-22 83 y.o. 08/01/2017 4:19 PM   Principle Diagnosis: Encounter Diagnoses  Name Primary?  . Monoclonal gammopathy   . Prostate cancer ([place])   . Renal insufficiency   . Chronic anticoagulation   . Essential tremor   . Complicated UTI (urinary tract infection)   . Acute bronchitis treated with antibiotics in the past 60 days   Clinical summary:  83 year old retired Engineer, civil (consulting) who I follow for a monoclonal gammopathy of undetermined significanceand low-grade prostate cancer in remission after a cryoablation procedure. He has additional cardiac issues with persistent atrial fibrillation now status post cardioversion 6. On chronic antiarrhythmic therapy with amiodarone. On chronic anticoagulation with Eliquis 2.5 mg twice daily in view of stage III chronic renal insufficiency. He developed a nonproductive cough without fever back in June. He was initially started on Septra since he was having an evaluation for dysuria and hematuria at that time by his urologist. He had an allergic reaction to the sulfa which he had taken in the past without a problem. His face swelled. Generalized malaise and just overall feeling terrible. I changed him to Cipro. Chest x-ray was done on June 18 which I personally reviewed. There was a vague increase in interstitial markings in the lingula which was compatible with a resolving pneumonia. He  had a persistent nonproductive cough. The feeling that something was sticking in his trachea but no dysphagia. No fever or chills. No polymyalgia or polyarthralgia. No GI symptoms. He was starting to get recurrent low-grade dysuria at time of a 05/25/17 office visit. He was getting weaker. Starting to lose weight. Not eating well because of his cough. Unstable on his feet because of the weakness and at high fall risk. Previous falls have resulted in facial fractures requiring surgery. He continued to  decline with persistent cough and progressive urinary tract symptoms to the point where he rapidly became bedridden and developed urinary incontinence. I advised hospital admission and he was admitted on July 18. During the hospital stay, his mental function deteriorated. Urine appeared infected again. I was concerned with urinary retention. A Foley catheter was placed. He was initially started on ceftriaxone. However cultures grew resistant Enterococcus faecalis. He was treated with vancomycin in view of penicillin allergy. He slowly improved with resolution of both the cough and urinary symptoms. CT scan of the chest showed no additional pathology to explain his persistent cough.  He was discharged to [facility] facility for rehabilitation. He did slowly recover. The Foley catheter was removed. He is now back home.  Interim History:   He is slowly gaining strength since discharge from the skilled nursing facility. Color looked good today. Mental status back to his baseline. He has suffered some decline in his hearing possibly due to recent antibiotics. Of note his renal function has improved as of July 23 with creatinine down to 1.3, BUN 18, compared with peak values of BUN 32 and creatinine 1.7 in early July. Cough has not recurred. Urinary symptoms have not recurred. He is accompanied by his daughter today who is his main caregiver. He does have around-the-clock nursing aides at home. Back on primidone 50 mg twice daily, his tremor is under moderate to good control. He noted 2 episodes of transient chest discomfort. Not pain. Probably palpitations. One clearly associated with exercise. He brought me a rhythm strip done electronically over the phone. It is a lead to only. The rhythm is regular. He does not appear  to be back in atrial fibrillation. This tracing was just done today.  Medications: reviewed  Allergies:  Allergies  Allergen Reactions  . Sulfa Antibiotics Swelling  . Chlorhexidine Rash   . Guaifenesin & Derivatives Itching  . Keflex [Cephalexin] Diarrhea  . Oxycodone Itching and Rash  . Oxycontin [Oxycodone Hcl] Itching and Rash  . Penicillins Other (See Comments)    Caused fungal reaction Has patient had a PCN reaction causing immediate rash, facial/tongue/throat swelling, SOB or lightheadedness with hypotension: No Has patient had a PCN reaction causing severe rash involving mucus membranes or skin necrosis: No Has patient had a PCN reaction that required hospitalization No Has patient had a PCN reaction occurring within the last 10 years: No If all of the above answers are "NO", then may proceed with Cephalosporin use.     Review of Systems: See interim history Remaining ROS negative:   Physical Exam: Blood pressure 139/71, pulse 84, temperature 98.1 F (36.7 C), temperature source Oral, height 5\' 8"  (1.727 m), weight 180 lb 4.8 oz (81.8 kg), SpO2 100 %. Wt Readings from Last 3 Encounters:  08/01/17 180 lb 4.8 oz (81.8 kg)  06/01/17 180 lb (81.6 kg)  05/25/17 185 lb 3.2 oz (84 kg)     General appearance: well nourished Caucasian man HENNT: Pharynx no erythema, exudate, mass, or ulcer. No thyromegaly or thyroid nodules Lymph nodes: No cervical, supraclavicular, or axillary right lymphadenopathy; small node on left less prominent on today's exam Breasts:  Lungs: Clear to auscultation, resonant to percussion throughout Heart: Regular rhythm, 2/6 AS murmur no change murmur, no gallop, no rub, no click, no edema Abdomen: Soft, nontender, normal bowel sounds, no mass, no organomegaly Extremities: No edema, no calf tenderness Musculoskeletal: no joint deformities GU:  Vascular: Carotid pulses 2+, no bruits,  Neurologic: Alert, oriented, PERRLA,  cranial nerves grossly normal except decreased hearing from baseline, motor strength 5 over 5, reflexes 1+ symmetric, upper body coordination normal, gait not tested, Skin: No rash or ecchymosis  Lab Results: CBC  W/Diff    Component Value Date/Time   WBC 6.8 06/03/2017 0646   RBC 3.09 (L) 06/03/2017 0646   HGB 9.9 (L) 06/03/2017 0646   HGB 10.5 (L) 02/28/2017 1048   HGB 12.1 (L) 01/14/2014 0804   HCT 28.7 (L) 06/03/2017 0646   HCT 31.2 (L) 02/28/2017 1048   HCT 36.5 (L) 01/14/2014 0804   PLT 109 (L) 06/03/2017 0646   PLT 239 02/28/2017 1048   MCV 92.9 06/03/2017 0646   MCV 100 (H) 02/28/2017 1048   MCV 99.8 (H) 01/14/2014 0804   MCH 32.0 06/03/2017 0646   MCHC 34.5 06/03/2017 0646   RDW 14.5 06/03/2017 0646   RDW 14.6 02/28/2017 1048   RDW 13.7 01/14/2014 0804   LYMPHSABS 1.5 06/01/2017 1530   LYMPHSABS 2.1 02/28/2017 1048   LYMPHSABS 2.6 01/14/2014 0804   MONOABS 0.6 06/01/2017 1530   MONOABS 0.7 01/14/2014 0804   EOSABS 0.0 06/01/2017 1530   EOSABS 0.4 02/28/2017 1048   BASOSABS 0.1 06/01/2017 1530   BASOSABS 0.0 02/28/2017 1048   BASOSABS 0.0 01/14/2014 0804     Chemistry      Component Value Date/Time   NA 134 (L) 06/06/2017 0025   NA 139 01/31/2017 1048   NA 139 01/14/2014 0804   K 4.0 06/06/2017 0025   K 4.8 01/14/2014 0804   CL 106 06/06/2017 0025   CL 107 03/19/2013 0949   CO2 25 06/06/2017 0025   CO2 25 01/14/2014  0804   BUN 18 06/06/2017 0025   BUN 18 01/31/2017 1048   BUN 16.7 01/14/2014 0804   CREATININE 1.28 (H) 06/06/2017 0025   CREATININE 1.63 (H) 12/09/2015 1215   CREATININE 1.7 (H) 01/14/2014 0804      Component Value Date/Time   CALCIUM 7.9 (L) 06/06/2017 0025   CALCIUM 9.4 01/14/2014 0804   ALKPHOS 49 06/02/2017 0338   ALKPHOS 45 01/14/2014 0804   AST 180 (H) 06/02/2017 0338   AST 21 01/14/2014 0804   ALT 175 (H) 06/02/2017 0338   ALT 20 01/14/2014 0804   BILITOT 0.9 06/02/2017 0338   BILITOT 0.4 01/31/2017 1048   BILITOT 0.71 01/14/2014 0804       Radiological Studies: No results found.  Impression:  #1.  IgG kappa monoclonal gammopathy of undetermined significance Hematologic parameters overall stable as of April 2018.  Continue  observation alone.He declined lab today. I'll repeat labs again in early March.  #2.  Prostate cancer treated with a radiofrequency ablation procedure PSA detectable but stable over time. Most recent value 2.96 on 06/04/2017.  #3.  Chronic refractory atrial fibrillation now status post cardioversion 6.  He continues on reduced dose anticoagulation with Eliquis.  Rhythm controlled with amiodarone.  #4.  Resting tremor. He did have significant improvement on primidone.  I have slowly titrated the dose.  Although there may be an interaction with the amiodarone, I think it would be quite minimal in view of the low doses we are using.    #5.  Valvular heart disease both mitral and aortic Currently asymptomatic.  #6.  History of traumatic fractures of his facial bones and right wrist and left clavicle in August 2015.  After a fall requiring surgery.  #7. Persistent bronchitis. Likely viral pneumonia which ultimately resolved but cause significant morbidity.  #8. Resistant enterococcus urinary tract infection successfully treated  #9. Intermittent hematuria with negative cystoscopy in June 2018.  CC: Patient Care Team: Annia Belt, MD as PCP - General (Oncology) Ronald Lobo, MD (Gastroenterology) Irine Seal, MD (Urology) Stanford Breed Denice Bors, MD as Consulting Physician (Cardiology)   Murriel Hopper, MD, [place]  Hematology-Oncology/Internal Medicine     9/17/20184:19 PM

## 2017-09-07 NOTE — Progress Notes (Signed)
HPI: Follow-up paroxysmal atrial fibrillation. Had cardiac catheterization in August 2011 that showed nonobstructive coronary disease. Abdominal ultrasound July 2014 showedno aneurysm. Echocardiogram repeated 2/18. LV function normal; mild AS, mild to moderate MR, moderate LAE and mild to moderate AI. Patient had successful cardioversion again on 02/24/2017. Chest CT July 2018 showed 0.4 cm right upper lobe nodule and follow-up could be considered at 12 months. Coronary calcification noted as well. Since last seen, patient denies dyspnea, chest pain, palpitations or syncope. No bleeding. He has some residual weakness following recent admission for urinary tract infection and pneumonia.  Current Outpatient Prescriptions  Medication Sig Dispense Refill  . amiodarone (PACERONE) 200 MG tablet Take 1 tablet (200 mg total) by mouth daily.    Marland Kitchen aspirin EC 81 MG tablet Take 81 mg by mouth every Monday, Wednesday, and Friday.    Marland Kitchen atorvastatin (LIPITOR) 10 MG tablet TAKE 1 TABLET BY MOUTH ONCE DAILY (Patient taking differently: TAKE 10MG  BY MOUTH ONCE DAILY) 90 tablet 3  . benzonatate (TESSALON PERLES) 100 MG capsule Take 1 capsule (100 mg total) by mouth every 8 (eight) hours as needed for cough. 30 capsule 1  . diphenhydrAMINE-zinc acetate (BENADRYL) cream Apply topically 3 (three) times daily as needed for itching. 28.4 g 0  . ELIQUIS 2.5 MG TABS tablet TAKE 1 TABLET BY MOUTH 2 TIMES DAILY (Patient taking differently: TAKE 2.5MG  BY MOUTH 2 TIMES DAILY) 60 tablet 11  . feeding supplement, ENSURE ENLIVE, (ENSURE ENLIVE) LIQD Take 237 mLs by mouth 2 (two) times daily between meals. 237 mL 12  . fosfomycin (MONUROL) 3 g PACK Take 3 g by mouth every 3 (three) days. 6 g 0  . primidone (MYSOLINE) 50 MG tablet Take 1 tablet (50 mg total) by mouth 2 (two) times daily at 10 AM and 5 PM. 60 tablet 2   No current facility-administered medications for this visit.      Past Medical History:  Diagnosis Date   . Acute bronchitis treated with antibiotics in the past 60 days 05/25/2017   Persistent cough despite two courses of antibiotics 05/25/17  . Anemia   . Aortic insufficiency    Mild, echo, July, 2011  . Aortic stenosis    Mild, echo, July, 2011  . Atrial fibrillation [facility]) 2011, 2015   Multaq Started September, 2011, dose adjusted October, 2011, 200 mg a.m., 400 mg p.o.  . Bradycardia    Sinus bradycardia while on 25 Lopressor twice a day October, 2013  . Carotid artery disease ([place])    Doppler, 2008 no significant abnormality  . Cervical spine disease   . Chronic anticoagulation 01/27/2015   pradaxa 75 mg BID for A fib  . Coronary artery disease    minimal, cath 2005 / catheterization August, 2011, 30% in 3 vessels, normal LV function  . Diarrhea    Chronic  . Drug therapy    Pradaxa Started July, 2011  . Ejection fraction    EF 60%, echo, 2009  /  TEE normal August, 2011 /   EF 60%, echo, July, 2011  . GERD (gastroesophageal reflux disease)    Esophageal dilatation in 1992, some esophageal spasm  . Hyperlipidemia   . Incomplete right bundle branch block    August, 2012  . Leg fatigue    June, 2014  . Memory change    June, 2014  . Monoclonal gammopathy    granfortuna  . MR (mitral regurgitation) 2009   mild, Echo, July, 2011  .  Painless hematuria 03/27/2012   Occurred x 2 5/12 & 5/13 on Pradaxa 75 mg BID  . Prostate cancer ([place]) 11/22/2011  . Renal insufficiency    Prior creatinine 1.2 /  September, 2011.. 1.7  /  October, 2011.. 1.8  . S/P transurethral resection of prostate 2004   2004  . TIA (transient ischemic attack)    Dr. Erling Cruz,, question in the past. when off ASA for 10 days    Past Surgical History:  Procedure Laterality Date  . CARDIAC CATHETERIZATION    . CARDIOVERSION  08/01/2012   Procedure: CARDIOVERSION;  Surgeon: Carlena Bjornstad, MD;  Location: [facility] ENDOSCOPY;  Service: Cardiovascular;  Laterality: N/A;  . CARDIOVERSION N/A 11/29/2013   Procedure:  CARDIOVERSION;  Surgeon: Carlena Bjornstad, MD;  Location: [facility] ENDOSCOPY;  Service: Cardiovascular;  Laterality: N/A;  . CARDIOVERSION N/A 11/27/2014   Procedure: CARDIOVERSION;  Surgeon: Carlena Bjornstad, MD;  Location: [facility] ENDOSCOPY;  Service: Cardiovascular;  Laterality: N/A;  . CARDIOVERSION N/A 03/12/2015   Procedure: CARDIOVERSION;  Surgeon: Carlena Bjornstad, MD;  Location: [place];  Service: Cardiovascular;  Laterality: N/A;  . CARDIOVERSION N/A 12/12/2015   Procedure: CARDIOVERSION;  Surgeon: Fay Records, MD;  Location: [place];  Service: Cardiovascular;  Laterality: N/A;  . CARDIOVERSION N/A 02/24/2017   Procedure: CARDIOVERSION;  Surgeon: Pixie Casino, MD;  Location: [facility] ENDOSCOPY;  Service: Cardiovascular;  Laterality: N/A;  . CATARACT EXTRACTION    . COLONOSCOPY    . EYE SURGERY Bilateral    cataract extraction  . Fibular fracture repair    . HERNIA REPAIR    . History of TURP    . OPEN REDUCTION INTERNAL FIXATION (ORIF) METACARPAL Left 06/22/2014   Procedure: OPEN REDUCTION INTERNAL FIXATION (ORIF) LEFT HAND METACARPAL;  Surgeon: Roseanne Kaufman, MD;  Location: [place];  Service: Orthopedics;  Laterality: Left;  . ORIF ORBITAL FRACTURE Bilateral 06/22/2014   Procedure: OPEN REDUCTION INTERNAL FIXATION (ORIF) BILATERAL LEFORTE1 FRACTURE OF MAXILLA, HYBRID ARCH BARS ;  Surgeon: Izora Gala, MD;  Location: [place];  Service: ENT;  Laterality: Bilateral;  . SHOULDER ARTHROSCOPY WITH ROTATOR CUFF REPAIR Right 01/23/2014   DR Noemi Chapel   . SHOULDER ARTHROSCOPY WITH ROTATOR CUFF REPAIR Right 01/23/2014   Procedure: RIGHT SHOULDER ARTHROSCOPY WITH DEBRIDEMENT AND ROTATOR CUFF REPAIR;  Surgeon: Lorn Junes, MD;  Location: [place];  Service: Orthopedics;  Laterality: Right;    Social History   Social History  . Marital status: Widowed    Spouse name: N/A  . Number of children: N/A  . 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  Smoking status: Former Research scientist (life sciences)  . Smokeless tobacco: Never Used  . Alcohol use No  . Drug use: No  . Sexual activity: Not Currently   Other Topics Concern  . Not on file   Social History Narrative  . No narrative on file    History reviewed. No pertinent family history.  ROS: Some weakness but no fevers or chills, productive cough, hemoptysis, dysphasia, odynophagia, melena, hematochezia, dysuria, hematuria, rash, seizure activity, orthopnea, PND, pedal edema, claudication. Remaining systems are negative.  Physical Exam: Well-developed well-nourished in no acute distress.  Skin is warm and dry.  HEENT is normal.  Neck is supple.  Chest is clear to auscultation with normal expansion.  Cardiovascular exam is regular rate and rhythm. 2/6 systolic murmur Abdominal exam nontender or distended. No masses  palpated. Extremities show no edema. neuro grossly intact  ECG- sinus rhythm at a rate of 83. Left axis deviation. Right bundle branch block. personally reviewed  A/P  1 atrial fibrillation/flutter- patient remains in sinus rhythm today. Continue amiodarone at present dose. Liver functions were elevated previously. Will recheck. Check potassium and renal function. Adjust apixaban dose if needed.  2 aortic valve disease-plan follow-up echoes in the future. Most recent study showed mild aortic stenosis and mild to moderate aortic insufficiency. Given his age and overall medical condition would like to be conservative if possible.  3 hyperlipidemia-continue statin.  4 history of nonobstructive coronary disease-continue statin. Requests to continue ASA.  5 Elevated BP-Typically controlled at home; follow and adjust regimen as needed.  Kirk Ruths, MD

## 2017-09-15 ENCOUNTER — Encounter: Payer: Self-pay | Admitting: Cardiology

## 2017-09-15 ENCOUNTER — Ambulatory Visit (INDEPENDENT_AMBULATORY_CARE_PROVIDER_SITE_OTHER): Payer: Medicare Other | Admitting: Cardiology

## 2017-09-15 VITALS — BP 136/96 | HR 83 | Ht 68.0 in | Wt 180.0 lb

## 2017-09-15 DIAGNOSIS — I480 Paroxysmal atrial fibrillation: Secondary | ICD-10-CM | POA: Diagnosis not present

## 2017-09-15 DIAGNOSIS — I350 Nonrheumatic aortic (valve) stenosis: Secondary | ICD-10-CM | POA: Diagnosis not present

## 2017-09-15 DIAGNOSIS — E7800 Pure hypercholesterolemia, unspecified: Secondary | ICD-10-CM

## 2017-09-15 NOTE — Patient Instructions (Signed)

## 2017-09-16 LAB — COMPREHENSIVE METABOLIC PANEL
ALBUMIN: 3.5 g/dL (ref 3.2–4.6)
ALK PHOS: 48 IU/L (ref 39–117)
ALT: 16 IU/L (ref 0–44)
AST: 23 IU/L (ref 0–40)
Albumin/Globulin Ratio: 0.8 — ABNORMAL LOW (ref 1.2–2.2)
BUN / CREAT RATIO: 11 (ref 10–24)
BUN: 20 mg/dL (ref 10–36)
Bilirubin Total: 0.5 mg/dL (ref 0.0–1.2)
CO2: 22 mmol/L (ref 20–29)
CREATININE: 1.81 mg/dL — AB (ref 0.76–1.27)
Calcium: 9.1 mg/dL (ref 8.6–10.2)
Chloride: 103 mmol/L (ref 96–106)
GFR calc Af Amer: 37 mL/min/{1.73_m2} — ABNORMAL LOW (ref >59)
GFR calc non Af Amer: 32 mL/min/{1.73_m2} — ABNORMAL LOW (ref >59)
GLUCOSE: 88 mg/dL (ref 65–99)
Globulin, Total: 4.2 g/dL (ref 1.5–4.5)
Potassium: 4.7 mmol/L (ref 3.5–5.2)
Sodium: 136 mmol/L (ref 134–144)
TOTAL PROTEIN: 7.7 g/dL (ref 6.0–8.5)

## 2017-09-30 ENCOUNTER — Other Ambulatory Visit: Payer: Self-pay | Admitting: Internal Medicine

## 2017-10-05 ENCOUNTER — Telehealth: Payer: Self-pay | Admitting: *Deleted

## 2017-10-05 DIAGNOSIS — I480 Paroxysmal atrial fibrillation: Secondary | ICD-10-CM

## 2017-10-05 NOTE — Telephone Encounter (Signed)
-----   Message from Lelon Perla, MD sent at 10/05/2017  4:05 PM EST ----- Can you arrange 24 hr holter on Dr [name] for a fib thx [facility]

## 2017-10-05 NOTE — Telephone Encounter (Signed)
Spoke with pt, monitor scheduled.

## 2017-10-19 ENCOUNTER — Ambulatory Visit (INDEPENDENT_AMBULATORY_CARE_PROVIDER_SITE_OTHER): Payer: Medicare Other

## 2017-10-19 DIAGNOSIS — I480 Paroxysmal atrial fibrillation: Secondary | ICD-10-CM

## 2017-10-26 ENCOUNTER — Telehealth: Payer: Self-pay | Admitting: *Deleted

## 2017-10-26 MED ORDER — METOPROLOL SUCCINATE ER 25 MG PO TB24: 25.0000 mg | ORAL_TABLET | Freq: Every day | ORAL | 3 refills | Status: DC

## 2017-10-26 NOTE — Telephone Encounter (Signed)
Spoke with pt, Aware of dr crenshaw's recommendations. New script sent to the pharmacy and Follow up scheduled  

## 2017-10-26 NOTE — Telephone Encounter (Signed)
-----   Message from Lelon Perla, MD sent at 10/26/2017  1:49 PM EST ----- Add toprol 25 mg daily and arrange fuov Kirk Ruths

## 2017-10-31 ENCOUNTER — Other Ambulatory Visit: Payer: Self-pay | Admitting: Dermatology

## 2017-11-01 ENCOUNTER — Other Ambulatory Visit: Payer: Self-pay | Admitting: Oncology

## 2017-11-01 NOTE — Telephone Encounter (Signed)
Verified dose against D/C summary

## 2017-11-09 DIAGNOSIS — D649 Anemia, unspecified: Secondary | ICD-10-CM | POA: Insufficient documentation

## 2017-11-09 DIAGNOSIS — Z79899 Other long term (current) drug therapy: Secondary | ICD-10-CM | POA: Insufficient documentation

## 2017-11-09 DIAGNOSIS — I4510 Unspecified right bundle-branch block: Secondary | ICD-10-CM | POA: Insufficient documentation

## 2017-11-09 DIAGNOSIS — R9430 Abnormal result of cardiovascular function study, unspecified: Secondary | ICD-10-CM | POA: Insufficient documentation

## 2017-11-09 DIAGNOSIS — R197 Diarrhea, unspecified: Secondary | ICD-10-CM | POA: Insufficient documentation

## 2017-11-23 NOTE — Progress Notes (Signed)
HPI: Follow-up atrial fibrillation. Had cardiac catheterization in August 2011 that showed nonobstructive coronary disease. Abdominal ultrasound July 2014 showedno aneurysm. Echocardiogram repeated 2/18. LV function normal; mild AS, mild to moderate MR, moderate LAE and mild to moderate AI. Patient had successful cardioversion again on 02/24/2017. Chest CT July 2018 showed 0.4 cm right upper lobe nodule and follow-up could be considered at 12 months. Coronary calcification noted as well.  Holter monitor December 2018 showed atrial fibrillation with elevated rate. Toprol 25 mg daily added. Since last seen, the patient has dyspnea with more extreme activities but not with routine activities. It is relieved with rest. It is not associated with chest pain. There is no orthopnea, PND or pedal edema. There is no syncope or palpitations. There is no exertional chest pain.    Current Outpatient Medications  Medication Sig Dispense Refill  . amiodarone (PACERONE) 200 MG tablet Take 1 tablet (200 mg total) by mouth daily.    Marland Kitchen atorvastatin (LIPITOR) 10 MG tablet TAKE 1 TABLET BY MOUTH ONCE DAILY (Patient taking differently: TAKE 10MG  BY MOUTH ONCE DAILY) 90 tablet 3  . ELIQUIS 2.5 MG TABS tablet TAKE 1 TABLET BY MOUTH 2 TIMES DAILY (Patient taking differently: TAKE 2.5MG  BY MOUTH 2 TIMES DAILY) 60 tablet 11  . metoprolol succinate (TOPROL XL) 25 MG 24 hr tablet Take 1 tablet (25 mg total) by mouth daily. (Patient taking differently: Take 12.5 mg by mouth daily. Pt takes 12.5mg  daily) 90 tablet 3  . primidone (MYSOLINE) 50 MG tablet TAKE 1 TABLET BY MOUTH TWICE DAILY AT 10:00 AM AND 5:00 PM 60 tablet 2   No current facility-administered medications for this visit.      Past Medical History:  Diagnosis Date  . Acute bronchitis treated with antibiotics in the past 60 days 05/25/2017   Persistent cough despite two courses of antibiotics 05/25/17  . Anemia   . Aortic insufficiency    Mild, echo, July,  2011  . Aortic stenosis    Mild, echo, July, 2011  . Atrial fibrillation [name]) 2011, 2015   Multaq Started September, 2011, dose adjusted October, 2011, 200 mg a.m., 400 mg p.o.  . Bradycardia    Sinus bradycardia while on 25 Lopressor twice a day October, 2013  . Carotid artery disease ([place])    Doppler, 2008 no significant abnormality  . Cervical spine disease   . Chronic anticoagulation 01/27/2015   pradaxa 75 mg BID for A fib  . Coronary artery disease    minimal, cath 2005 / catheterization August, 2011, 30% in 3 vessels, normal LV function  . Diarrhea    Chronic  . Drug therapy    Pradaxa Started July, 2011  . Ejection fraction    EF 60%, echo, 2009  /  TEE normal August, 2011 /   EF 60%, echo, July, 2011  . GERD (gastroesophageal reflux disease)    Esophageal dilatation in 1992, some esophageal spasm  . Hyperlipidemia   . Incomplete right bundle branch block    August, 2012  . Leg fatigue    June, 2014  . Memory change    June, 2014  . Monoclonal gammopathy    granfortuna  . MR (mitral regurgitation) 2009   mild, Echo, July, 2011  . Painless hematuria 03/27/2012   Occurred x 2 5/12 & 5/13 on Pradaxa 75 mg BID  . Prostate cancer ([place]) 11/22/2011  . Renal insufficiency    Prior creatinine 1.2 /  September, 2011.. 1.7  /  October, 2011.. 1.8  . S/P transurethral resection of prostate 2004   2004  . TIA (transient ischemic attack)    Dr. Erling Cruz,, question in the past. when off ASA for 10 days    Past Surgical History:  Procedure Laterality Date  . CARDIAC CATHETERIZATION    . CARDIOVERSION  08/01/2012   Procedure: CARDIOVERSION;  Surgeon: Carlena Bjornstad, MD;  Location: [facility] ENDOSCOPY;  Service: Cardiovascular;  Laterality: N/A;  . CARDIOVERSION N/A 11/29/2013   Procedure: CARDIOVERSION;  Surgeon: Carlena Bjornstad, MD;  Location: [facility] ENDOSCOPY;  Service: Cardiovascular;  Laterality: N/A;  . CARDIOVERSION N/A 11/27/2014   Procedure: CARDIOVERSION;  Surgeon: Carlena Bjornstad, MD;   Location: [facility] ENDOSCOPY;  Service: Cardiovascular;  Laterality: N/A;  . CARDIOVERSION N/A 03/12/2015   Procedure: CARDIOVERSION;  Surgeon: Carlena Bjornstad, MD;  Location: [place];  Service: Cardiovascular;  Laterality: N/A;  . CARDIOVERSION N/A 12/12/2015   Procedure: CARDIOVERSION;  Surgeon: Fay Records, MD;  Location: [place];  Service: Cardiovascular;  Laterality: N/A;  . CARDIOVERSION N/A 02/24/2017   Procedure: CARDIOVERSION;  Surgeon: Pixie Casino, MD;  Location: [facility] ENDOSCOPY;  Service: Cardiovascular;  Laterality: N/A;  . CATARACT EXTRACTION    . COLONOSCOPY    . EYE SURGERY Bilateral    cataract extraction  . Fibular fracture repair    . HERNIA REPAIR    . History of TURP    . OPEN REDUCTION INTERNAL FIXATION (ORIF) METACARPAL Left 06/22/2014   Procedure: OPEN REDUCTION INTERNAL FIXATION (ORIF) LEFT HAND METACARPAL;  Surgeon: Roseanne Kaufman, MD;  Location: [place];  Service: Orthopedics;  Laterality: Left;  . ORIF ORBITAL FRACTURE Bilateral 06/22/2014   Procedure: OPEN REDUCTION INTERNAL FIXATION (ORIF) BILATERAL LEFORTE1 FRACTURE OF MAXILLA, HYBRID ARCH BARS ;  Surgeon: Izora Gala, MD;  Location: [place];  Service: ENT;  Laterality: Bilateral;  . SHOULDER ARTHROSCOPY WITH ROTATOR CUFF REPAIR Right 01/23/2014   DR Noemi Chapel   . SHOULDER ARTHROSCOPY WITH ROTATOR CUFF REPAIR Right 01/23/2014   Procedure: RIGHT SHOULDER ARTHROSCOPY WITH DEBRIDEMENT AND ROTATOR CUFF REPAIR;  Surgeon: Lorn Junes, MD;  Location: [place];  Service: Orthopedics;  Laterality: Right;    Social History   Socioeconomic History  . Marital status: Widowed    Spouse name: Not on file  . Number of children: Not on file  . Years of education: Not on file  . Highest education level: Not on file  Social Needs  . Financial resource strain: Not on file  . Food insecurity - worry: Not on file  . Food insecurity - inability: Not on file  . Transportation needs - medical: Not on file  . Transportation needs -  non-medical: Not on file  Occupational History  . Occupation: retired    Comment: physician  Tobacco Use  . Smoking status: Former Research scientist (life sciences)  . Smokeless tobacco: Never Used  Substance and Sexual Activity  . Alcohol use: No    Alcohol/week: 1.2 oz    Types: 1 Glasses of wine, 1 Shots of liquor per week  . Drug use: No  . Sexual activity: Not Currently  Other Topics Concern  . Not on file  Social History Narrative  . Not on file    History reviewed. No pertinent family history.  ROS: mild fatigue but no fevers or chills, productive cough, hemoptysis, dysphasia, odynophagia, melena, hematochezia, dysuria, hematuria, rash, seizure activity, orthopnea, PND,  claudication. Remaining systems are negative.  Physical Exam: Well-developed well-nourished in no acute distress.  Skin  is warm and dry.  HEENT is normal.  Neck is supple.  Chest is clear to auscultation with normal expansion.  Cardiovascular exam is irregular, 2/6 systolic murmur Abdominal exam nontender or distended. No masses palpated. Extremities show trace edema. neuro grossly intact  ECG- atrial fibrillation at a rate of 103. Right bundle branch block. Left anterior fascicular block.personally reviewed  A/P  1 atrial fibrillation/flutter-continue apixaban. Check hemoglobin and renal function. Continue amiodarone. Check chest xray, TSH, LFTs. Continue low-dose Toprol. He did not tolerate higher doses. Long discussion today concerning options. He is relatively asymptomatic and I would therefore favor long-term rate control and anticoagulation. He has had difficulties tolerating higher doses of AV nodal blocking agents. I will therefore continue amiodarone to help with this.  2 aortic valve disease-most recent echocardiogram showed mild aortic stenosis and mild to moderate aortic insufficiency. We can plan follow-up echo is in the future but would like to be conservative given patient's age.  3 hyperlipidemia-continue  statin.  4 history of nonobstructive coronary artery disease-continue statin. Patient requests to continue aspirin.  5 lung nodule-repeat CT July 2019.  Kirk Ruths, MD

## 2017-11-30 ENCOUNTER — Encounter: Payer: Self-pay | Admitting: Cardiology

## 2017-11-30 ENCOUNTER — Ambulatory Visit: Payer: Medicare Other | Admitting: Cardiology

## 2017-11-30 VITALS — BP 124/88 | HR 103 | Ht 67.5 in | Wt 182.8 lb

## 2017-11-30 DIAGNOSIS — I350 Nonrheumatic aortic (valve) stenosis: Secondary | ICD-10-CM

## 2017-11-30 DIAGNOSIS — E7800 Pure hypercholesterolemia, unspecified: Secondary | ICD-10-CM | POA: Diagnosis not present

## 2017-11-30 DIAGNOSIS — I480 Paroxysmal atrial fibrillation: Secondary | ICD-10-CM | POA: Diagnosis not present

## 2017-11-30 NOTE — Patient Instructions (Signed)
Medication Instructions:   NO CHANGE  Labwork:  Your physician recommends that you HAVE LAB WORK WITH DR Beryle Beams   Testing/Procedures:  A chest x-ray takes a picture of the organs and structures inside the chest, including the heart, lungs, and blood vessels. This test can show several things, including, whether the heart is enlarges; whether fluid is building up in the lungs; and whether pacemaker / defibrillator leads are still in place. AT [facility]  Follow-Up:  Your physician wants you to follow-up in: [place] will receive a reminder letter in the mail two months in advance. If you don't receive a letter, please call our office to schedule the follow-up appointment.   If you need a refill on your cardiac medications before your next appointment, please call your pharmacy.

## 2017-12-13 ENCOUNTER — Other Ambulatory Visit: Payer: Self-pay | Admitting: Oncology

## 2017-12-13 DIAGNOSIS — Z7901 Long term (current) use of anticoagulants: Secondary | ICD-10-CM

## 2017-12-13 DIAGNOSIS — D472 Monoclonal gammopathy: Secondary | ICD-10-CM

## 2017-12-13 DIAGNOSIS — R911 Solitary pulmonary nodule: Secondary | ICD-10-CM

## 2017-12-13 DIAGNOSIS — C61 Malignant neoplasm of prostate: Secondary | ICD-10-CM

## 2017-12-13 DIAGNOSIS — N183 Chronic kidney disease, stage 3 unspecified: Secondary | ICD-10-CM

## 2017-12-13 DIAGNOSIS — Z79899 Other long term (current) drug therapy: Secondary | ICD-10-CM

## 2017-12-13 DIAGNOSIS — N289 Disorder of kidney and ureter, unspecified: Secondary | ICD-10-CM

## 2017-12-13 DIAGNOSIS — D631 Anemia in chronic kidney disease: Secondary | ICD-10-CM

## 2017-12-20 ENCOUNTER — Other Ambulatory Visit (INDEPENDENT_AMBULATORY_CARE_PROVIDER_SITE_OTHER): Payer: Medicare Other

## 2017-12-20 ENCOUNTER — Ambulatory Visit (HOSPITAL_COMMUNITY)
Admission: RE | Admit: 2017-12-20 | Discharge: 2017-12-20 | Disposition: A | Discharge status: Home / Self Care | Payer: Medicare Other | Source: Ambulatory Visit | Attending: Oncology | Admitting: Oncology

## 2017-12-20 DIAGNOSIS — C61 Malignant neoplasm of prostate: Secondary | ICD-10-CM | POA: Diagnosis not present

## 2017-12-20 DIAGNOSIS — N183 Chronic kidney disease, stage 3 (moderate): Secondary | ICD-10-CM

## 2017-12-20 DIAGNOSIS — N289 Disorder of kidney and ureter, unspecified: Secondary | ICD-10-CM | POA: Diagnosis not present

## 2017-12-20 DIAGNOSIS — D472 Monoclonal gammopathy: Secondary | ICD-10-CM

## 2017-12-20 DIAGNOSIS — R911 Solitary pulmonary nodule: Secondary | ICD-10-CM | POA: Insufficient documentation

## 2017-12-20 DIAGNOSIS — Z7901 Long term (current) use of anticoagulants: Secondary | ICD-10-CM | POA: Diagnosis not present

## 2017-12-20 DIAGNOSIS — D631 Anemia in chronic kidney disease: Secondary | ICD-10-CM

## 2017-12-20 DIAGNOSIS — Z79899 Other long term (current) drug therapy: Secondary | ICD-10-CM

## 2017-12-21 LAB — CBC WITH DIFFERENTIAL/PLATELET
BASOS ABS: 0 10*3/uL (ref 0.0–0.2)
BASOS: 0 %
EOS (ABSOLUTE): 0.2 10*3/uL (ref 0.0–0.4)
Eos: 3 %
Hematocrit: 34.9 % — ABNORMAL LOW (ref 37.5–51.0)
Hemoglobin: 11.7 g/dL — ABNORMAL LOW (ref 13.0–17.7)
IMMATURE GRANULOCYTES: 0 %
Immature Grans (Abs): 0 10*3/uL (ref 0.0–0.1)
Lymphocytes Absolute: 2.2 10*3/uL (ref 0.7–3.1)
Lymphs: 35 %
MCH: 34.5 pg — ABNORMAL HIGH (ref 26.6–33.0)
MCHC: 33.5 g/dL (ref 31.5–35.7)
MCV: 103 fL — ABNORMAL HIGH (ref 79–97)
Monocytes Absolute: 0.6 10*3/uL (ref 0.1–0.9)
Monocytes: 9 %
NEUTROS PCT: 53 %
Neutrophils Absolute: 3.4 10*3/uL (ref 1.4–7.0)
PLATELETS: 217 10*3/uL (ref 150–379)
RBC: 3.39 x10E6/uL — AB (ref 4.14–5.80)
RDW: 14.9 % (ref 12.3–15.4)
WBC: 6.4 10*3/uL (ref 3.4–10.8)

## 2017-12-21 LAB — COMPREHENSIVE METABOLIC PANEL
A/G RATIO: 0.8 — AB (ref 1.2–2.2)
ALK PHOS: 44 IU/L (ref 39–117)
ALT: 21 IU/L (ref 0–44)
AST: 22 IU/L (ref 0–40)
Albumin: 3.6 g/dL (ref 3.2–4.6)
BUN/Creatinine Ratio: 12 (ref 10–24)
BUN: 18 mg/dL (ref 10–36)
Bilirubin Total: 0.5 mg/dL (ref 0.0–1.2)
CALCIUM: 9 mg/dL (ref 8.6–10.2)
CHLORIDE: 105 mmol/L (ref 96–106)
CO2: 21 mmol/L (ref 20–29)
Creatinine, Ser: 1.56 mg/dL — ABNORMAL HIGH (ref 0.76–1.27)
GFR calc Af Amer: 45 mL/min/{1.73_m2} — ABNORMAL LOW (ref >59)
GFR, EST NON AFRICAN AMERICAN: 39 mL/min/{1.73_m2} — AB (ref >59)
Globulin, Total: 4.3 g/dL (ref 1.5–4.5)
Glucose: 93 mg/dL (ref 65–99)
POTASSIUM: 4.7 mmol/L (ref 3.5–5.2)
Sodium: 139 mmol/L (ref 134–144)
Total Protein: 7.9 g/dL (ref 6.0–8.5)

## 2017-12-21 LAB — KAPPA/LAMBDA LIGHT CHAINS
IG KAPPA FREE LIGHT CHAIN: 77.6 mg/L — AB (ref 3.3–19.4)
IG LAMBDA FREE LIGHT CHAIN: 9.9 mg/L (ref 5.7–26.3)
KAPPA/LAMBDA FLC RATIO: 7.84 — AB (ref 0.26–1.65)

## 2017-12-21 LAB — IGG, IGA, IGM
IGA/IMMUNOGLOBULIN A, SERUM: 73 mg/dL (ref 61–437)
IgG (Immunoglobin G), Serum: 3314 mg/dL — ABNORMAL HIGH (ref 700–1600)
IgM (Immunoglobulin M), Srm: 76 mg/dL (ref 15–143)

## 2017-12-21 LAB — TSH: TSH: 1.77 u[IU]/mL (ref 0.450–4.500)

## 2017-12-21 LAB — PSA: Prostate Specific Ag, Serum: 2.1 ng/mL (ref 0.0–4.0)

## 2018-01-03 ENCOUNTER — Encounter: Payer: Self-pay | Admitting: Oncology

## 2018-01-03 ENCOUNTER — Other Ambulatory Visit: Payer: Self-pay

## 2018-01-03 ENCOUNTER — Ambulatory Visit (INDEPENDENT_AMBULATORY_CARE_PROVIDER_SITE_OTHER): Payer: Medicare Other | Admitting: Oncology

## 2018-01-03 VITALS — BP 135/92 | Temp 98.0°F | Ht 67.0 in | Wt 185.6 lb

## 2018-01-03 DIAGNOSIS — N289 Disorder of kidney and ureter, unspecified: Secondary | ICD-10-CM

## 2018-01-03 DIAGNOSIS — C61 Malignant neoplasm of prostate: Secondary | ICD-10-CM

## 2018-01-03 DIAGNOSIS — D472 Monoclonal gammopathy: Secondary | ICD-10-CM

## 2018-01-03 NOTE — Progress Notes (Signed)
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 01/03/2018 2:14 PM   Principle Diagnosis: Encounter Diagnoses  Name Primary?  . Renal insufficiency   . MGUS (monoclonal gammopathy of unknown significance) Yes  . 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  Urine grew enterococcus faecalis.  His mental status deteriorated during that hospital stay but I am pleased to report that he is back to his baseline mental function.  Unfortunately his physical function has overall declined.  He now has 24-hour care in his home to supervise him.  He had a temporary stay in a local rehab facility after discharge.  Subsequent success in removing his Foley catheter.  He is now voiding without difficulty.  He has had no recurrent hematuria.  Respiratory symptoms have resolved. On a positive note, his resting tremor is improved significantly on low-dose primidone.  He is able to play the piano again. His atrial fibrillation persists.  He has now had 6 cardioversions.  He is on amiodarone and metoprolol as well as long-term anticoagulation with apixaban 2.5 mg twice daily.  Thyroid functions on amiodarone checked on February 5 with TSH normal at 1.77.  Creatinine deteriorated prior to his hospitalization now back to his baseline with creatinine 1.6 done on December 20, 2017.  With regard to his monoclonal gammopathy, his total IgG immunoglobulin now exceeds 3 g and his serum free light chain ratio is increasing now up to 7.84.  Hemoglobin remained stable.  He denies any bone pain.  With regard to his prostate cancer, PSA remains  in the normal range at 2.1 as of December 20, 2017.  A CT scan of the chest was done on July 20 when he was in the hospital.  A 0.4 cm right upper lobe pulmonary nodule was noted.  He is a never smoker.  Rather than put him through another CT scan, I obtained a plain chest x-ray in anticipation of today's visit which does not show any obvious mass densities.    Medications: reviewed  Allergies:  Allergies  Allergen Reactions  . Sulfa Antibiotics Swelling  . Chlorhexidine Rash  . Guaifenesin & Derivatives Itching  . Keflex [Cephalexin] Diarrhea  . Oxycodone Itching and Rash  . Oxycontin [Oxycodone Hcl] Itching and Rash  . Penicillins Other (See Comments)    Caused fungal reaction Has patient had a PCN reaction causing immediate rash, facial/tongue/throat swelling, SOB or lightheadedness with hypotension: No Has patient had a PCN reaction causing severe rash involving mucus membranes or skin necrosis: No Has patient had a PCN reaction that required hospitalization No Has patient had a PCN reaction occurring within the last 10 years: No If all of the above answers are "NO", then may proceed with Cephalosporin use.     Review of Systems: See interim history Remaining ROS negative:   Physical Exam: Blood pressure (!) 135/92, temperature 98 F (36.7 C), temperature source Oral, height _0  (1.702 m), weight 185 lb 9.6 oz (84.2 kg), SpO2 100 %. Wt Readings from Last 3 Encounters:  01/03/18 185 lb 9.6 oz (84.2 kg)  11/30/17 182 lb 12.8 oz (82.9 kg)  09/15/17 180 lb (81.6 kg)  General appearance: Increasingly frail Caucasian man HENNT: Pharynx no erythema, exudate, mass, or ulcer. No thyromegaly or thyroid nodules Lymph nodes: No cervical, supraclavicular, or axillary lymphadenopathy.  Chronic 2 cm node in the left axilla not easily palpable today. Breasts:  Lungs: Clear to auscultation, resonant to percussion throughout Heart: Irregular rhythm, 2/6 aortic murmur unchanged, no  gallop, no rub, no click, no edema Abdomen: Soft, nontender, normal bowel sounds, no mass, no organomegaly Extremities: No edema, no calf tenderness Musculoskeletal: no joint deformities GU:  Vascular: Carotid pulses 2+, no bruits, Neurologic: Alert, oriented, PERRLA, optic discs sharp poorly visualized,  vessels normal, no hemorrhage or exudate, cranial nerves grossly normal, motor strength 5 over 5, reflexes 1+ symmetric, upper body coordination normal, gait unsteady due to overall weakness. Skin: No rash or ecchymosis  Lab Results: CBC W/Diff    Component Value Date/Time   WBC 6.4 12/20/2017 0918   WBC 6.8 06/03/2017 0646   RBC 3.39 (L) 12/20/2017 0918   RBC 3.09 (L) 06/03/2017 0646   HGB 11.7 (L) 12/20/2017 0918   HGB 12.1 (L) 01/14/2014 0804   HCT 34.9 (L) 12/20/2017 0918   HCT 36.5 (L) 01/14/2014 0804   PLT 217 12/20/2017 0918   MCV 103 (H) 12/20/2017 0918   MCV 99.8 (H) 01/14/2014 0804   MCH 34.5 (H) 12/20/2017 0918   MCH 32.0 06/03/2017 0646   MCHC 33.5 12/20/2017 0918   MCHC 34.5 06/03/2017 0646   RDW 14.9 12/20/2017 0918   RDW 13.7 01/14/2014 0804   LYMPHSABS 2.2 12/20/2017 0918   LYMPHSABS 2.6 01/14/2014 0804   MONOABS 0.6 06/01/2017 1530   MONOABS 0.7 01/14/2014 0804   EOSABS 0.2 12/20/2017 0918   BASOSABS 0.0 12/20/2017 0918   BASOSABS 0.0 01/14/2014 0804     Chemistry      Component Value Date/Time   NA 139 12/20/2017 0918   NA 139 01/14/2014 0804   K 4.7 12/20/2017 0918   K 4.8 01/14/2014 0804   CL 105 12/20/2017 0918   CL 107 03/19/2013 0949   CO2 21 12/20/2017 0918   CO2 25 01/14/2014 0804   BUN 18 12/20/2017 0918   BUN 16.7 01/14/2014 0804   CREATININE 1.56 (H) 12/20/2017 0918   CREATININE 1.63 (H) 12/09/2015 1215   CREATININE 1.7 (H) 01/14/2014 0804      Component Value Date/Time   CALCIUM 9.0 12/20/2017 0918   CALCIUM 9.4 01/14/2014 0804   ALKPHOS 44 12/20/2017 0918   ALKPHOS 45 01/14/2014 0804   AST 22 12/20/2017 0918   AST 21  01/14/2014 0804   ALT 21 12/20/2017 0918   ALT 20 01/14/2014 0804   BILITOT 0.5 12/20/2017 0918   BILITOT 0.71 01/14/2014 0804       Radiological Studies: Dg Chest 2 View  Result Date: 12/20/2017 CLINICAL DATA:  Follow-up pulmonary nodule EXAM: CHEST  2 VIEW COMPARISON:  CT from 06/03/2017 FINDINGS: Cardiac shadow is at the upper limits of normal in size. Aortic calcifications are seen. Lungs are well aerated bilaterally without focal infiltrate or sizable effusion. Previously seen right upper lobe pulmonary nodule is not well appreciated on this exam. No acute bony abnormality is noted. Degenerative changes of the thoracic spine are again seen and stable. IMPRESSION: No acute abnormality noted. Previously seen nodule is not visualized on this exam likely related to its size. Clinical follow-up is necessary noncontrast CT as previously mentioned is recommended. Electronically Signed   By: Inez Catalina M.D.   On: 12/20/2017 11:54  Impression:  1.  IgG monoclonal gammopathy. Immunoglobulin level now above 3 g with elevated free light chain ratio would now qualify for smoldering multiple myeloma.  We would need a bone marrow biopsy to confirm this.  We discussed this together today.  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  His hemoglobin if anything is better than his baseline at 11.7 with normal white count and platelets.  Calcium normal at 9 with low normal albumin 3.6.  He would like to defer follow-up labs for 6 months.  2.  Early stage prostate cancer treated remotely with a radiofrequency ablation procedure no evidence for recurrence  3.  Persistent atrial fibrillation status post cardioversion x6 Rate controlled on current medications and I think this is the best that we will be able to accomplish in this now 84 year old man.  Continue anticoagulation and antiarrhythmics.  4.  Transient hematuria and bladder outlet obstruction from resistant  urinary tract infection resolved with treatment  5.  Pneumonia July 2018 treated both outpatient and inpatient with resolution.  6.  Chronic, unchanged, single lymph node in the left axilla never requiring biopsy or further evaluation.  7.  Chronic renal insufficiency stage III  8.  Essential tremor Responded to low-dose primidone.  Continue the same.  9.  Deconditioning  Goals of care discussion: We had an open discussion and he would not want aggressive resuscitative efforts including chest compressions or ventilatory support in the event of an irreversible deterioration in his condition.  He felt that he faced death during recent hospitalization and was ready to go if it was his time. 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  CC: Patient Care Team: Annia Belt, MD as PCP - General (Oncology) Lelon Perla, MD as PCP - Cardiology (Cardiology) Ronald Lobo, MD (Gastroenterology) Lelon Perla, MD as Consulting Physician (Cardiology) Irine Seal, MD as Attending Physician (Urology)   Murriel Hopper, MD, [place]  Hematology-Oncology/Internal Medicine     2/19/20192:14 PM

## 2018-01-03 NOTE — Patient Instructions (Signed)
Lab on 06/16/18 Visit 1-2 weeks after lab

## 2018-01-09 ENCOUNTER — Telehealth: Payer: Self-pay | Admitting: *Deleted

## 2018-01-09 ENCOUNTER — Other Ambulatory Visit: Payer: Self-pay | Admitting: Oncology

## 2018-01-09 DIAGNOSIS — D472 Monoclonal gammopathy: Secondary | ICD-10-CM

## 2018-01-09 NOTE — Telephone Encounter (Signed)
Pt stated aware of bone scan ordered for Wed 2/27 but what time? Informed does not need an appt ; go to 1st floor anytime Wed.

## 2018-01-09 NOTE — Telephone Encounter (Signed)
-----   Message from Annia Belt, MD sent at 01/09/2018  8:31 AM EST ----- I scheduled him for bone X-rays at [facility] this Wed 2/29. Do you have to schedule w Radiology?

## 2018-01-11 ENCOUNTER — Ambulatory Visit (HOSPITAL_COMMUNITY)
Admission: RE | Admit: 2018-01-11 | Discharge: 2018-01-11 | Disposition: A | Discharge status: Home / Self Care | Payer: Medicare Other | Source: Ambulatory Visit | Attending: Oncology | Admitting: Oncology

## 2018-01-11 DIAGNOSIS — M5134 Other intervertebral disc degeneration, thoracic region: Secondary | ICD-10-CM | POA: Diagnosis not present

## 2018-01-11 DIAGNOSIS — M5030 Other cervical disc degeneration, unspecified cervical region: Secondary | ICD-10-CM | POA: Insufficient documentation

## 2018-01-11 DIAGNOSIS — M899 Disorder of bone, unspecified: Secondary | ICD-10-CM | POA: Diagnosis not present

## 2018-01-11 DIAGNOSIS — D472 Monoclonal gammopathy: Secondary | ICD-10-CM

## 2018-01-11 DIAGNOSIS — M5136 Other intervertebral disc degeneration, lumbar region: Secondary | ICD-10-CM | POA: Insufficient documentation

## 2018-01-18 ENCOUNTER — Other Ambulatory Visit: Payer: Self-pay | Admitting: Oncology

## 2018-01-21 DIAGNOSIS — M79642 Pain in left hand: Secondary | ICD-10-CM | POA: Insufficient documentation

## 2018-01-30 ENCOUNTER — Other Ambulatory Visit: Payer: Self-pay | Admitting: Internal Medicine

## 2018-02-28 ENCOUNTER — Other Ambulatory Visit: Payer: Self-pay | Admitting: Oncology

## 2018-03-28 ENCOUNTER — Other Ambulatory Visit: Payer: Self-pay | Admitting: Oncology

## 2018-04-11 ENCOUNTER — Other Ambulatory Visit: Payer: Self-pay | Admitting: Oncology

## 2018-04-11 DIAGNOSIS — C44301 Unspecified malignant neoplasm of skin of nose: Secondary | ICD-10-CM

## 2018-04-11 DIAGNOSIS — C44601 Unspecified malignant neoplasm of skin of unspecified upper limb, including shoulder: Secondary | ICD-10-CM

## 2018-04-14 ENCOUNTER — Other Ambulatory Visit: Payer: Self-pay | Admitting: Oncology

## 2018-04-14 DIAGNOSIS — D472 Monoclonal gammopathy: Secondary | ICD-10-CM

## 2018-04-14 NOTE — Addendum Note (Signed)
Addended by: Orson Gear on: 04/14/2018 11:03 AM   Modules accepted: Orders

## 2018-04-20 ENCOUNTER — Other Ambulatory Visit: Payer: Self-pay | Admitting: Cardiology

## 2018-04-20 DIAGNOSIS — I4891 Unspecified atrial fibrillation: Secondary | ICD-10-CM

## 2018-04-20 NOTE — Telephone Encounter (Signed)
Rx sent to pharmacy   

## 2018-05-26 ENCOUNTER — Other Ambulatory Visit: Payer: Self-pay | Admitting: *Deleted

## 2018-05-26 MED ORDER — PRIMIDONE 50 MG PO TABS: ORAL_TABLET | ORAL | 6 refills | Status: DC

## 2018-06-09 NOTE — Progress Notes (Signed)
HPI: Follow-up atrial fibrillation. Had cardiac catheterization in August 2011 that showed nonobstructive coronary disease. Abdominal ultrasound July 2014 showedno aneurysm. Echocardiogram repeated 2/18. LV function normal; mild AS, mild to moderate MR, moderate LAE and mild to moderate AI. Patient had successful cardioversion again on 02/24/2017.Holter monitor December 2018 showed atrial fibrillation with elevated rate. Toprol 25 mg daily added. Since last seen,  patient denies dyspnea, chest pain, palpitations, syncope or bleeding.  He does note fatigue.  Current Outpatient Medications  Medication Sig Dispense Refill  . amiodarone (PACERONE) 200 MG tablet TAKE 1 TABLET BY MOUTH ONCE DAILY 90 tablet 0  . atorvastatin (LIPITOR) 10 MG tablet TAKE 1 TABLET BY MOUTH ONCE DAILY (Patient taking differently: TAKE 10MG  BY MOUTH ONCE DAILY) 90 tablet 3  . ELIQUIS 2.5 MG TABS tablet TAKE 1 TABLET BY MOUTH TWICE A DAY 60 tablet 5  . metoprolol succinate (TOPROL XL) 25 MG 24 hr tablet Take 1 tablet (25 mg total) by mouth daily. (Patient taking differently: Take 12.5 mg by mouth daily. Pt takes 12.5mg  daily) 90 tablet 3  . primidone (MYSOLINE) 50 MG tablet TAKE 1 TABLET BY MOUTH TWICE DAILY( 10:00 AM AND 5:00 PM) 60 tablet 6   No current facility-administered medications for this visit.      Past Medical History:  Diagnosis Date  . Acute bronchitis treated with antibiotics in the past 60 days 05/25/2017   Persistent cough despite two courses of antibiotics 05/25/17  . Anemia   . Aortic insufficiency    Mild, echo, July, 2011  . Aortic stenosis    Mild, echo, July, 2011  . Atrial fibrillation [facility]) 2011, 2015   Multaq Started September, 2011, dose adjusted October, 2011, 200 mg a.m., 400 mg p.o.  . Bradycardia    Sinus bradycardia while on 25 Lopressor twice a day October, 2013  . Carotid artery disease ([place])    Doppler, 2008 no significant abnormality  . Cervical spine disease   . Chronic  anticoagulation 01/27/2015   pradaxa 75 mg BID for A fib  . Coronary artery disease    minimal, cath 2005 / catheterization August, 2011, 30% in 3 vessels, normal LV function  . Diarrhea    Chronic  . Drug therapy    Pradaxa Started July, 2011  . Ejection fraction    EF 60%, echo, 2009  /  TEE normal August, 2011 /   EF 60%, echo, July, 2011  . GERD (gastroesophageal reflux disease)    Esophageal dilatation in 1992, some esophageal spasm  . Hyperlipidemia   . Incomplete right bundle branch block    August, 2012  . Leg fatigue    June, 2014  . Memory change    June, 2014  . Monoclonal gammopathy    granfortuna  . MR (mitral regurgitation) 2009   mild, Echo, July, 2011  . Painless hematuria 03/27/2012   Occurred x 2 5/12 & 5/13 on Pradaxa 75 mg BID  . Prostate cancer ([place]) 11/22/2011  . Renal insufficiency    Prior creatinine 1.2 /  September, 2011.. 1.7  /  October, 2011.. 1.8  . S/P transurethral resection of prostate 2004   2004  . TIA (transient ischemic attack)    Dr. Erling Cruz,, question in the past. when off ASA for 10 days    Past Surgical History:  Procedure Laterality Date  . CARDIAC CATHETERIZATION    . CARDIOVERSION  08/01/2012   Procedure: CARDIOVERSION;  Surgeon: Carlena Bjornstad, MD;  Location: [place] ENDOSCOPY;  Service: Cardiovascular;  Laterality: N/A;  . CARDIOVERSION N/A 11/29/2013   Procedure: CARDIOVERSION;  Surgeon: Carlena Bjornstad, MD;  Location: [facility] ENDOSCOPY;  Service: Cardiovascular;  Laterality: N/A;  . CARDIOVERSION N/A 11/27/2014   Procedure: CARDIOVERSION;  Surgeon: Carlena Bjornstad, MD;  Location: [facility] ENDOSCOPY;  Service: Cardiovascular;  Laterality: N/A;  . CARDIOVERSION N/A 03/12/2015   Procedure: CARDIOVERSION;  Surgeon: Carlena Bjornstad, MD;  Location: [place];  Service: Cardiovascular;  Laterality: N/A;  . CARDIOVERSION N/A 12/12/2015   Procedure: CARDIOVERSION;  Surgeon: Fay Records, MD;  Location: [facility] ENDOSCOPY;  Service: Cardiovascular;  Laterality: N/A;    . CARDIOVERSION N/A 02/24/2017   Procedure: CARDIOVERSION;  Surgeon: Pixie Casino, MD;  Location: [place];  Service: Cardiovascular;  Laterality: N/A;  . CATARACT EXTRACTION    . COLONOSCOPY    . EYE SURGERY Bilateral    cataract extraction  . Fibular fracture repair    . HERNIA REPAIR    . History of TURP    . OPEN REDUCTION INTERNAL FIXATION (ORIF) METACARPAL Left 06/22/2014   Procedure: OPEN REDUCTION INTERNAL FIXATION (ORIF) LEFT HAND METACARPAL;  Surgeon: Roseanne Kaufman, MD;  Location: [place];  Service: Orthopedics;  Laterality: Left;  . ORIF ORBITAL FRACTURE Bilateral 06/22/2014   Procedure: OPEN REDUCTION INTERNAL FIXATION (ORIF) BILATERAL LEFORTE1 FRACTURE OF MAXILLA, HYBRID ARCH BARS ;  Surgeon: Izora Gala, MD;  Location: [place];  Service: ENT;  Laterality: Bilateral;  . SHOULDER ARTHROSCOPY WITH ROTATOR CUFF REPAIR Right 01/23/2014   DR Noemi Chapel   . SHOULDER ARTHROSCOPY WITH ROTATOR CUFF REPAIR Right 01/23/2014   Procedure: RIGHT SHOULDER ARTHROSCOPY WITH DEBRIDEMENT AND ROTATOR CUFF REPAIR;  Surgeon: Lorn Junes, MD;  Location: [place];  Service: Orthopedics;  Laterality: Right;    Social History   Socioeconomic History  . Marital status: Widowed    Spouse name: Not on file  . Number of children: Not on file  . Years of education: Not on file  . Highest education level: Not on file  Occupational History  . Occupation: retired    Comment: physician  Social Needs  . Financial resource strain: Not on file  . Food insecurity:    Worry: Not on file    Inability: Not on file  . Transportation needs:    Medical: Not on file    Non-medical: Not on file  Tobacco Use  . Smoking status: Former Research scientist (life sciences)  . Smokeless tobacco: Never Used  Substance and Sexual Activity  . Alcohol use: No    Alcohol/week: 1.2 oz    Types: 1 Glasses of wine, 1 Shots of liquor per week  . Drug use: No  . Sexual activity: Not Currently  Lifestyle  . Physical activity:    Days per week: Not on  file    Minutes per session: Not on file  . Stress: Not on file  Relationships  . Social connections:    Talks on phone: Not on file    Gets together: Not on file    Attends religious service: Not on file    Active member of club or organization: Not on file    Attends meetings of clubs or organizations: Not on file    Relationship status: Not on file  . Intimate partner violence:    Fear of current or ex partner: Not on file    Emotionally abused: Not on file    Physically abused: Not on file    Forced sexual activity:  Not on file  Other Topics Concern  . Not on file  Social History Narrative  . Not on file    No family history on file.  ROS: no fevers or chills, productive cough, hemoptysis, dysphasia, odynophagia, melena, hematochezia, dysuria, hematuria, rash, seizure activity, orthopnea, PND,  claudication. Remaining systems are negative.  Physical Exam: Well-developed well-nourished in no acute distress.  Skin is warm and dry.  HEENT is normal.  Neck is supple.  Chest is clear to auscultation with normal expansion.  Cardiovascular exam is irregular, 2/6 systolic murmur left sternal border. Abdominal exam nontender or distended. No masses palpated. Extremities show trace to 1+ ankle edema. neuro grossly intact  ECG-atrial fibrillation at a rate of 84.  Left axis deviation.  Right bundle branch block.  Personally reviewed  A/P  1 atrial fibrillation-patient remains in atrial fibrillation today.  His rate is controlled.  Continue low-dose Toprol and amiodarone for rate control.  I am continuing amiodarone as he did not tolerate higher doses of Toprol previously.  Given patient's age and recurrences of atrial fibrillation would favor rate control and anticoagulation.  Continue apixaban.  Check TSH, liver functions and chest x-ray when he follows up with Dr Sanjuana Letters.   2 aortic valve disease-patient had mild aortic stenosis and mild to moderate aortic insufficiency on  most recent echocardiogram.  I would like to be conservative given patient's age and overall medical condition.  3 hyperlipidemia-continue statin.  4 history of nonobstructive coronary artery disease-continue statin.  We have continued aspirin with apixaban at patient's request.  5 lung nodule-followed by Dr. Beryle Beams.  He repeated a chest x-ray that showed no nodule.  Conservative measures felt indicated.  Kirk Ruths, MD

## 2018-06-13 ENCOUNTER — Encounter: Payer: Self-pay | Admitting: Cardiology

## 2018-06-13 ENCOUNTER — Ambulatory Visit: Payer: Medicare Other | Admitting: Cardiology

## 2018-06-13 VITALS — BP 138/84 | HR 84 | Ht 67.0 in | Wt 191.8 lb

## 2018-06-13 DIAGNOSIS — E7800 Pure hypercholesterolemia, unspecified: Secondary | ICD-10-CM

## 2018-06-13 DIAGNOSIS — I350 Nonrheumatic aortic (valve) stenosis: Secondary | ICD-10-CM

## 2018-06-13 DIAGNOSIS — I480 Paroxysmal atrial fibrillation: Secondary | ICD-10-CM | POA: Diagnosis not present

## 2018-06-13 NOTE — Patient Instructions (Signed)
Your physician wants you to follow-up in: 6 MONTHS WITH DR CRENSHAW You will receive a reminder letter in the mail two months in advance. If you don't receive a letter, please call our office to schedule the follow-up appointment.   If you need a refill on your cardiac medications before your next appointment, please call your pharmacy.  

## 2018-06-16 ENCOUNTER — Other Ambulatory Visit: Payer: Self-pay | Admitting: Student in an Organized Health Care Education/Training Program

## 2018-06-29 ENCOUNTER — Other Ambulatory Visit: Payer: Self-pay | Admitting: Oncology

## 2018-06-29 DIAGNOSIS — C61 Malignant neoplasm of prostate: Secondary | ICD-10-CM

## 2018-06-29 DIAGNOSIS — Z7901 Long term (current) use of anticoagulants: Secondary | ICD-10-CM

## 2018-06-29 DIAGNOSIS — D472 Monoclonal gammopathy: Secondary | ICD-10-CM

## 2018-07-14 ENCOUNTER — Other Ambulatory Visit: Payer: Self-pay | Admitting: Oncology

## 2018-07-14 NOTE — Telephone Encounter (Signed)
Next appt scheduled  9/10.

## 2018-07-17 ENCOUNTER — Other Ambulatory Visit: Payer: Self-pay | Admitting: Cardiology

## 2018-07-17 DIAGNOSIS — I4891 Unspecified atrial fibrillation: Secondary | ICD-10-CM

## 2018-07-18 ENCOUNTER — Other Ambulatory Visit (INDEPENDENT_AMBULATORY_CARE_PROVIDER_SITE_OTHER): Payer: Medicare Other

## 2018-07-18 DIAGNOSIS — C61 Malignant neoplasm of prostate: Secondary | ICD-10-CM

## 2018-07-18 DIAGNOSIS — D472 Monoclonal gammopathy: Secondary | ICD-10-CM

## 2018-07-18 DIAGNOSIS — I481 Persistent atrial fibrillation: Secondary | ICD-10-CM | POA: Diagnosis not present

## 2018-07-18 DIAGNOSIS — Z7901 Long term (current) use of anticoagulants: Secondary | ICD-10-CM | POA: Diagnosis not present

## 2018-07-18 DIAGNOSIS — I4819 Other persistent atrial fibrillation: Secondary | ICD-10-CM

## 2018-07-18 NOTE — Telephone Encounter (Signed)
Rx request sent to pharmacy.  

## 2018-07-19 LAB — COMPREHENSIVE METABOLIC PANEL
ALT: 21 IU/L (ref 0–44)
AST: 20 IU/L (ref 0–40)
Albumin/Globulin Ratio: 1 — ABNORMAL LOW (ref 1.2–2.2)
Albumin: 3.6 g/dL (ref 3.2–4.6)
Alkaline Phosphatase: 42 IU/L (ref 39–117)
BUN/Creatinine Ratio: 9 — ABNORMAL LOW (ref 10–24)
BUN: 14 mg/dL (ref 10–36)
Bilirubin Total: 0.5 mg/dL (ref 0.0–1.2)
CO2: 21 mmol/L (ref 20–29)
CREATININE: 1.55 mg/dL — AB (ref 0.76–1.27)
Calcium: 8.9 mg/dL (ref 8.6–10.2)
Chloride: 105 mmol/L (ref 96–106)
GFR calc Af Amer: 45 mL/min/{1.73_m2} — ABNORMAL LOW (ref >59)
GFR, EST NON AFRICAN AMERICAN: 39 mL/min/{1.73_m2} — AB (ref >59)
GLOBULIN, TOTAL: 3.7 g/dL (ref 1.5–4.5)
GLUCOSE: 76 mg/dL (ref 65–99)
Potassium: 4.6 mmol/L (ref 3.5–5.2)
Sodium: 136 mmol/L (ref 134–144)

## 2018-07-19 LAB — CBC WITH DIFFERENTIAL/PLATELET
BASOS: 0 %
Basophils Absolute: 0 10*3/uL (ref 0.0–0.2)
EOS (ABSOLUTE): 0.4 10*3/uL (ref 0.0–0.4)
EOS: 6 %
HEMATOCRIT: 34.6 % — AB (ref 37.5–51.0)
HEMOGLOBIN: 11.6 g/dL — AB (ref 13.0–17.7)
IMMATURE GRANULOCYTES: 0 %
Immature Grans (Abs): 0 10*3/uL (ref 0.0–0.1)
LYMPHS: 30 %
Lymphocytes Absolute: 2 10*3/uL (ref 0.7–3.1)
MCH: 35.5 pg — ABNORMAL HIGH (ref 26.6–33.0)
MCHC: 33.5 g/dL (ref 31.5–35.7)
MCV: 106 fL — AB (ref 79–97)
MONOCYTES: 11 %
Monocytes Absolute: 0.7 10*3/uL (ref 0.1–0.9)
NEUTROS PCT: 53 %
Neutrophils Absolute: 3.4 10*3/uL (ref 1.4–7.0)
Platelets: 222 10*3/uL (ref 150–450)
RBC: 3.27 x10E6/uL — ABNORMAL LOW (ref 4.14–5.80)
RDW: 13.8 % (ref 12.3–15.4)
WBC: 6.6 10*3/uL (ref 3.4–10.8)

## 2018-07-19 LAB — IMMUNOFIXATION ELECTROPHORESIS
IGA/IMMUNOGLOBULIN A, SERUM: 70 mg/dL (ref 61–437)
IGM (IMMUNOGLOBULIN M), SRM: 69 mg/dL (ref 15–143)
IgG (Immunoglobin G), Serum: 3061 mg/dL — ABNORMAL HIGH (ref 700–1600)
Total Protein: 7.3 g/dL (ref 6.0–8.5)

## 2018-07-19 LAB — KAPPA/LAMBDA LIGHT CHAINS
IG LAMBDA FREE LIGHT CHAIN: 10.2 mg/L (ref 5.7–26.3)
Ig Kappa Free Light Chain: 89 mg/L — ABNORMAL HIGH (ref 3.3–19.4)
Kappa/Lambda FluidC Ratio: 8.73 — ABNORMAL HIGH (ref 0.26–1.65)

## 2018-07-19 LAB — TSH: TSH: 1.9 u[IU]/mL (ref 0.450–4.500)

## 2018-07-19 LAB — PSA: Prostate Specific Ag, Serum: 2.2 ng/mL (ref 0.0–4.0)

## 2018-07-25 ENCOUNTER — Ambulatory Visit (INDEPENDENT_AMBULATORY_CARE_PROVIDER_SITE_OTHER): Payer: Medicare Other | Admitting: Oncology

## 2018-07-25 ENCOUNTER — Ambulatory Visit (HOSPITAL_COMMUNITY)
Admission: RE | Admit: 2018-07-25 | Discharge: 2018-07-25 | Disposition: A | Discharge status: Home / Self Care | Payer: Medicare Other | Source: Ambulatory Visit | Attending: Oncology | Admitting: Oncology

## 2018-07-25 ENCOUNTER — Other Ambulatory Visit: Payer: Self-pay

## 2018-07-25 ENCOUNTER — Encounter: Payer: Self-pay | Admitting: Oncology

## 2018-07-25 VITALS — BP 143/100 | HR 90 | Temp 98.9°F | Wt 190.9 lb

## 2018-07-25 DIAGNOSIS — N183 Chronic kidney disease, stage 3 (moderate): Secondary | ICD-10-CM

## 2018-07-25 DIAGNOSIS — Z87440 Personal history of urinary (tract) infections: Secondary | ICD-10-CM

## 2018-07-25 DIAGNOSIS — Z888 Allergy status to other drugs, medicaments and biological substances status: Secondary | ICD-10-CM

## 2018-07-25 DIAGNOSIS — Z961 Presence of intraocular lens: Secondary | ICD-10-CM

## 2018-07-25 DIAGNOSIS — I5030 Unspecified diastolic (congestive) heart failure: Secondary | ICD-10-CM | POA: Diagnosis not present

## 2018-07-25 DIAGNOSIS — D472 Monoclonal gammopathy: Secondary | ICD-10-CM | POA: Diagnosis present

## 2018-07-25 DIAGNOSIS — Z8546 Personal history of malignant neoplasm of prostate: Secondary | ICD-10-CM

## 2018-07-25 DIAGNOSIS — Z882 Allergy status to sulfonamides status: Secondary | ICD-10-CM

## 2018-07-25 DIAGNOSIS — I482 Chronic atrial fibrillation: Secondary | ICD-10-CM | POA: Diagnosis not present

## 2018-07-25 DIAGNOSIS — Z880 Allergy status to penicillin: Secondary | ICD-10-CM

## 2018-07-25 DIAGNOSIS — I080 Rheumatic disorders of both mitral and aortic valves: Secondary | ICD-10-CM

## 2018-07-25 DIAGNOSIS — Z91048 Other nonmedicinal substance allergy status: Secondary | ICD-10-CM

## 2018-07-25 DIAGNOSIS — R011 Cardiac murmur, unspecified: Secondary | ICD-10-CM

## 2018-07-25 DIAGNOSIS — Z881 Allergy status to other antibiotic agents status: Secondary | ICD-10-CM

## 2018-07-25 DIAGNOSIS — G250 Essential tremor: Secondary | ICD-10-CM

## 2018-07-25 DIAGNOSIS — Z9181 History of falling: Secondary | ICD-10-CM

## 2018-07-25 DIAGNOSIS — Z79899 Other long term (current) drug therapy: Secondary | ICD-10-CM

## 2018-07-25 DIAGNOSIS — Z885 Allergy status to narcotic agent status: Secondary | ICD-10-CM

## 2018-07-25 DIAGNOSIS — M1990 Unspecified osteoarthritis, unspecified site: Secondary | ICD-10-CM

## 2018-07-25 DIAGNOSIS — Z8781 Personal history of (healed) traumatic fracture: Secondary | ICD-10-CM

## 2018-07-25 DIAGNOSIS — Z7901 Long term (current) use of anticoagulants: Secondary | ICD-10-CM

## 2018-07-25 DIAGNOSIS — Z8701 Personal history of pneumonia (recurrent): Secondary | ICD-10-CM

## 2018-07-25 DIAGNOSIS — C61 Malignant neoplasm of prostate: Secondary | ICD-10-CM

## 2018-07-25 DIAGNOSIS — Z87448 Personal history of other diseases of urinary system: Secondary | ICD-10-CM

## 2018-07-25 DIAGNOSIS — M549 Dorsalgia, unspecified: Secondary | ICD-10-CM

## 2018-07-25 DIAGNOSIS — Z923 Personal history of irradiation: Secondary | ICD-10-CM

## 2018-07-25 DIAGNOSIS — Z85828 Personal history of other malignant neoplasm of skin: Secondary | ICD-10-CM

## 2018-07-25 NOTE — Patient Instructions (Addendum)
To Radiology Today for bone X-rays Lab in 6 months MD visit 1 week after lab  Please schedule new patient visit with Dr Julieanne Manson at [facility] for April or May 2020. Early stage IgG myeloma; Prostate cancer in remission.

## 2018-07-25 NOTE — Progress Notes (Signed)
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 07/25/2018 11:42 AM   Principle Diagnosis: Encounter Diagnoses  Name Primary?  . Chronic anticoagulation   . MGUS (monoclonal gammopathy of unknown significance) Yes  . Monoclonal gammopathy   . 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  He was initially diagnosed with an IgG kappa monoclonal gammopathy undetermined significance in July 2000 with a concomitant diagnosis of localized prostate cancer treated with radiofrequency ablation procedure at the same month. PSA has been stable over time and he has developed no signs of progression. IgG paraprotein has slowly risen over time but hemoglobin has remained stable and he has not developed any bone disease, hypercalcemia, or progressive renal dysfunction above and beyond his baseline CKD 3. He has chronic persistent atrial fibrillation and has reverted to atrial fibrillation despite 6 cardioversion procedures.  Heart rate controlled with amiodarone and low-dose Toprol-XL.  He is on chronic anticoagulation with age-adjusted apixaban 2.5 mg twice daily. He has asymptomatic aortic stenosis and mitral regurgitation.  Grade 3 diastolic dysfunction on December 2016 echocardiogram with preserved ejection fraction 60-65%. He has become increasingly unsteady on his feet and had a number of falls.  He required surgery for bilateral LeFort I nasal and maxillary sinus fractures and fractures of the bones of his left hand in July 2015.  Subsequent fall resulting in a right clavicle fracture in February 2016. He has had an extensive neurologic evaluation for resting/essential tremor.  Currently controlled on low-dose primidone with recent addition of low-dose Toprol-XL in July 2019. He developed a progressive respiratory illness, pneumonia, hematuria, recurrent resistant Enterococcus faecalis urinary tract infection, and bladder  outlet obstruction, requiring hospitalization in July 2018.  Renal function deteriorated with peak creatinine 1.8.  Stabilization over time down to current value of 1.5.   Interim History: Overall stable since last visit.  He has now had multiple visits with Dr. Jarome [name], dermatology, and has had multiple skin cancers removed from his scalp, behind his right ear, and from the proximal left arm. Atrial fibrillation remains rate controlled.  Recent addition of Toprol-XL to his amiodarone which has also helped his essential tremor.  No tremor on exam today on both primidone low-dose and Toprol. He has progressive degenerative arthritis.  No new areas of bone pain.  Metastatic bone survey done in February with no lytic lesions. He has noted some increasing edema of his ankles right greater than left.  No dyspnea at rest.  Medications: reviewed  Allergies:  Allergies  Allergen Reactions  . Sulfa Antibiotics Swelling  . Chlorhexidine Rash  . Guaifenesin & Derivatives Itching  . Keflex [Cephalexin] Diarrhea  . Oxycodone Itching and Rash  . Oxycontin [Oxycodone Hcl] Itching and Rash  . Penicillins Other (See Comments)    Caused fungal reaction Has patient had a PCN reaction causing immediate rash, facial/tongue/throat swelling, SOB or lightheadedness with hypotension: No Has patient had a PCN reaction causing severe rash involving mucus membranes or skin necrosis: No Has patient had a PCN reaction that required hospitalization No Has patient had a PCN reaction occurring within the last 10 years: No If all of the above answers are "NO", then may proceed with Cephalosporin use.     Review of Systems: See interim history Remaining ROS negative:   Physical Exam: Blood pressure (!) 143/100, pulse 90, temperature 98.9 F (37.2 C), temperature source Oral, weight 190 lb 14.4 oz (86.6 kg), SpO2  100 %. Wt Readings from Last 3 Encounters:  07/25/18 190 lb 14.4 oz (86.6 kg)  06/13/18 191 lb 12.8  oz (87 kg)  01/03/18 185 lb 9.6 oz (84.2 kg)     General appearance: Frail, elderly, Caucasian man [name] [name]: Pharynx no erythema, exudate, mass, or ulcer. No thyromegaly or thyroid nodules Lymph nodes: No cervical, supraclavicular, or axillary lymphadenopathy (left axillary node chronically enlarged not palpable today). Breasts:  Lungs: Clear to auscultation, resonant to percussion throughout Heart: Irregularly irregular rhythm, chronic mitral regurg and aortic systolic murmurs, asymmetric ankle edema right greater than left. Abdomen: Soft, nontender, normal bowel sounds, no mass, no organomegaly Extremities: No edema, no calf tenderness Musculoskeletal: no joint deformities but significant degenerative arthritis.  Back pain.  Difficulty getting on the exam bench.  Difficulty lying supine. GU:  Vascular: Carotid pulses 2+, no bruits,  Neurologic: Alert, oriented, PERRLA, bilateral intraocular lens implants, cranial nerves grossly normal except decreased hearing, motor strength 5 over 5, reflexes 1+ symmetric, upper body coordination normal, gait limited by painful arthritis, no resting tremor on primidone. Skin: Senile purpura dorsa both hands. Scar proximal left arm status post wide excision of a basal cell carcinoma.  Scar posterior to the right ear status post excision of basal cell carcinoma.  Scar on the scalp status post excision basal cell carcinoma.  Lab Results: CBC W/Diff    Component Value Date/Time   WBC 6.6 07/18/2018 1056   WBC 6.8 06/03/2017 0646   RBC 3.27 (L) 07/18/2018 1056   RBC 3.09 (L) 06/03/2017 0646   HGB 11.6 (L) 07/18/2018 1056   HGB 12.1 (L) 01/14/2014 0804   HCT 34.6 (L) 07/18/2018 1056   HCT 36.5 (L) 01/14/2014 0804   PLT 222 07/18/2018 1056   MCV 106 (H) 07/18/2018 1056   MCV 99.8 (H) 01/14/2014 0804   MCH 35.5 (H) 07/18/2018 1056   MCH 32.0 06/03/2017 0646   MCHC 33.5 07/18/2018 1056   MCHC 34.5 06/03/2017 0646   RDW 13.8 07/18/2018 1056   RDW 13.7  01/14/2014 0804   LYMPHSABS 2.0 07/18/2018 1056   LYMPHSABS 2.6 01/14/2014 0804   MONOABS 0.6 06/01/2017 1530   MONOABS 0.7 01/14/2014 0804   EOSABS 0.4 07/18/2018 1056   BASOSABS 0.0 07/18/2018 1056   BASOSABS 0.0 01/14/2014 0804     Chemistry      Component Value Date/Time   NA 136 07/18/2018 1056   NA 139 01/14/2014 0804   K 4.6 07/18/2018 1056   K 4.8 01/14/2014 0804   CL 105 07/18/2018 1056   CL 107 03/19/2013 0949   CO2 21 07/18/2018 1056   CO2 25 01/14/2014 0804   BUN 14 07/18/2018 1056   BUN 16.7 01/14/2014 0804   CREATININE 1.55 (H) 07/18/2018 1056   CREATININE 1.63 (H) 12/09/2015 1215   CREATININE 1.7 (H) 01/14/2014 0804      Component Value Date/Time   CALCIUM 8.9 07/18/2018 1056   CALCIUM 9.4 01/14/2014 0804   ALKPHOS 42 07/18/2018 1056   ALKPHOS 45 01/14/2014 0804   AST 20 07/18/2018 1056   AST 21 01/14/2014 0804   ALT 21 07/18/2018 1056   ALT 20 01/14/2014 0804   BILITOT 0.5 07/18/2018 1056   BILITOT 0.71 01/14/2014 0804    IgG: 3061 mg percent Serum free light chain ratio: 8.7. Calcium and albumin normal. Creatinine at baseline. Hemoglobin at baseline. TSH 1.9 on amiodarone PSA stable at 2.2    Radiological Studies: No results found.  Impression: 1.  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 No evidence for end organ damage at this point.  Bone x-rays done in February with no lytic lesions. I discussed with the patient, optimal timing to begin treatment for early-stage myeloma remains controversial.  2 clinical trials to date 1 most recently presented at [facility] in June 2019 confirms improved progression free survival with early treatment with single agent lenalidomide but there were some caution signals with unacceptable increase in second malignancies, primarily skin cancers.  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  He has no evidence for endorgan damage.  He just  turned 84 years old.  I will repeat bone x-rays today, approximate 18-month interval, but unless there are unexpected findings, I will continue to follow with observation alone.  2.  Prostate cancer treated with a radiofrequency ablation procedure over 15 years ago.  Stable PSA in normal range.  3.  Refractory/persistent atrial fibrillation status post cardioversion x6 with recurrent A. fib after each event.  Rate controlled with amiodarone and beta-blocker.  He continues on age-adjusted renal function adjusted chronic anticoagulation with Eliquis.  4.  Benign essential tremor. Now well controlled with primidone 50 mg twice daily with recent addition of Toprol-XL 25 mg daily.  5.  Asymptomatic valvular heart disease aortic and mitral valves.  6.  CKD 3.  I will see him one more time in 6 months then transition his care to Dr. Julieanne Manson at the [facility] lung cancer [name].  CC: Patient Care Team: Annia Belt, MD as PCP - General Stanford Breed Denice Bors, MD as PCP - Cardiology (Cardiology) Ronald Lobo, MD (Gastroenterology) Stanford Breed Denice Bors, MD as Consulting Physician (Cardiology) Irine Seal, MD as Attending Physician (Urology)   Murriel Hopper, MD, [place]  Hematology-Oncology/Internal Medicine     9/10/201911:42 AM

## 2018-07-26 ENCOUNTER — Encounter: Payer: Self-pay | Admitting: Oncology

## 2018-07-26 ENCOUNTER — Telehealth: Payer: Self-pay | Admitting: *Deleted

## 2018-07-26 NOTE — Telephone Encounter (Signed)
Call from [facility] Radiology - Bone survey report "Possible nodular rigth lung base that was not visible on prior studies. Recommend pa/lat cxr". Dr Beryle Beams called /informed - stated he will take a look at the report.

## 2018-07-26 NOTE — Progress Notes (Signed)
Call to patient to report that metastatic bone survey shows no lytic lesions that would be consistent with active myeloma. Incidentally noted was a small calcified granuloma right lower lobe.  Although this was not seen on a January 09, 2018 chest x-ray, I reviewed this study with the radiologist and this granuloma was seen and unchanged compared with a CT scan of the chest done June 03, 2017.

## 2018-08-08 ENCOUNTER — Other Ambulatory Visit: Payer: Self-pay | Admitting: Oncology

## 2018-08-08 DIAGNOSIS — D472 Monoclonal gammopathy: Secondary | ICD-10-CM

## 2018-08-14 ENCOUNTER — Other Ambulatory Visit: Payer: Self-pay | Admitting: Internal Medicine

## 2018-09-13 ENCOUNTER — Other Ambulatory Visit: Payer: Self-pay | Admitting: Oncology

## 2018-09-22 ENCOUNTER — Telehealth: Payer: Self-pay | Admitting: Oncology

## 2018-09-22 ENCOUNTER — Other Ambulatory Visit: Payer: Self-pay | Admitting: Oncology

## 2018-09-22 DIAGNOSIS — R0781 Pleurodynia: Secondary | ICD-10-CM

## 2018-09-22 DIAGNOSIS — D472 Monoclonal gammopathy: Secondary | ICD-10-CM

## 2018-09-22 NOTE — Telephone Encounter (Signed)
OK  He will have to wait to see me unless I have a no show

## 2018-09-22 NOTE — Telephone Encounter (Signed)
-----   Message from Annia Belt, MD sent at 09/22/2018  1:45 PM EST ----- Work in visit 12 PM Monday 11/11; patient aware. To have rib X-rays first

## 2018-09-22 NOTE — Telephone Encounter (Signed)
Daughter stated she will bring pt in around 1000 am for x-ray then come straight to the clinic.

## 2018-09-22 NOTE — Telephone Encounter (Signed)
Pt daughter; pt has cracked a rib, his in terrible pain. Pt haven't come to the hospital. Pt daughter wants someone to call her back [phone].   Pt wants to talk physician before coming to the hospital   -Holley Raring

## 2018-09-25 ENCOUNTER — Other Ambulatory Visit: Payer: Self-pay | Admitting: Oncology

## 2018-09-25 ENCOUNTER — Other Ambulatory Visit: Payer: Self-pay

## 2018-09-25 ENCOUNTER — Ambulatory Visit (HOSPITAL_COMMUNITY)
Admission: RE | Admit: 2018-09-25 | Discharge: 2018-09-25 | Disposition: A | Discharge status: Home / Self Care | Payer: Medicare Other | Source: Ambulatory Visit | Attending: Oncology | Admitting: Oncology

## 2018-09-25 ENCOUNTER — Encounter: Payer: Self-pay | Admitting: Oncology

## 2018-09-25 ENCOUNTER — Ambulatory Visit (INDEPENDENT_AMBULATORY_CARE_PROVIDER_SITE_OTHER): Payer: Medicare Other | Admitting: Oncology

## 2018-09-25 VITALS — BP 131/87 | HR 60 | Temp 98.1°F | Wt 195.2 lb

## 2018-09-25 DIAGNOSIS — Z8546 Personal history of malignant neoplasm of prostate: Secondary | ICD-10-CM

## 2018-09-25 DIAGNOSIS — D472 Monoclonal gammopathy: Secondary | ICD-10-CM | POA: Diagnosis not present

## 2018-09-25 DIAGNOSIS — R0781 Pleurodynia: Secondary | ICD-10-CM

## 2018-09-25 DIAGNOSIS — Z888 Allergy status to other drugs, medicaments and biological substances status: Secondary | ICD-10-CM

## 2018-09-25 DIAGNOSIS — X58XXXA Exposure to other specified factors, initial encounter: Secondary | ICD-10-CM

## 2018-09-25 DIAGNOSIS — S2231XA Fracture of one rib, right side, initial encounter for closed fracture: Secondary | ICD-10-CM | POA: Insufficient documentation

## 2018-09-25 DIAGNOSIS — Z923 Personal history of irradiation: Secondary | ICD-10-CM

## 2018-09-25 DIAGNOSIS — I350 Nonrheumatic aortic (valve) stenosis: Secondary | ICD-10-CM

## 2018-09-25 DIAGNOSIS — C61 Malignant neoplasm of prostate: Secondary | ICD-10-CM

## 2018-09-25 DIAGNOSIS — Z91048 Other nonmedicinal substance allergy status: Secondary | ICD-10-CM

## 2018-09-25 DIAGNOSIS — Z882 Allergy status to sulfonamides status: Secondary | ICD-10-CM

## 2018-09-25 DIAGNOSIS — I340 Nonrheumatic mitral (valve) insufficiency: Secondary | ICD-10-CM

## 2018-09-25 DIAGNOSIS — S2231XB Fracture of one rib, right side, initial encounter for open fracture: Secondary | ICD-10-CM

## 2018-09-25 DIAGNOSIS — R011 Cardiac murmur, unspecified: Secondary | ICD-10-CM

## 2018-09-25 DIAGNOSIS — Z8781 Personal history of (healed) traumatic fracture: Secondary | ICD-10-CM

## 2018-09-25 DIAGNOSIS — G250 Essential tremor: Secondary | ICD-10-CM

## 2018-09-25 DIAGNOSIS — Z87440 Personal history of urinary (tract) infections: Secondary | ICD-10-CM

## 2018-09-25 DIAGNOSIS — C9000 Multiple myeloma not having achieved remission: Secondary | ICD-10-CM | POA: Diagnosis not present

## 2018-09-25 DIAGNOSIS — Z885 Allergy status to narcotic agent status: Secondary | ICD-10-CM

## 2018-09-25 DIAGNOSIS — Z9181 History of falling: Secondary | ICD-10-CM

## 2018-09-25 DIAGNOSIS — M1990 Unspecified osteoarthritis, unspecified site: Secondary | ICD-10-CM

## 2018-09-25 DIAGNOSIS — Z7901 Long term (current) use of anticoagulants: Secondary | ICD-10-CM

## 2018-09-25 DIAGNOSIS — Z8701 Personal history of pneumonia (recurrent): Secondary | ICD-10-CM

## 2018-09-25 DIAGNOSIS — Z79899 Other long term (current) drug therapy: Secondary | ICD-10-CM

## 2018-09-25 DIAGNOSIS — N183 Chronic kidney disease, stage 3 (moderate): Secondary | ICD-10-CM

## 2018-09-25 DIAGNOSIS — Z880 Allergy status to penicillin: Secondary | ICD-10-CM

## 2018-09-25 DIAGNOSIS — I4819 Other persistent atrial fibrillation: Secondary | ICD-10-CM

## 2018-09-25 HISTORY — DX: Pleurodynia: R07.81

## 2018-09-25 HISTORY — DX: Fracture of one rib, right side, initial encounter for closed fracture: S22.31XA

## 2018-09-25 LAB — COMPREHENSIVE METABOLIC PANEL
ALK PHOS: 38 U/L (ref 38–126)
ALT: 20 U/L (ref 0–44)
AST: 22 U/L (ref 15–41)
Albumin: 3.3 g/dL — ABNORMAL LOW (ref 3.5–5.0)
Anion gap: 4 — ABNORMAL LOW (ref 5–15)
BUN: 17 mg/dL (ref 8–23)
CHLORIDE: 104 mmol/L (ref 98–111)
CO2: 26 mmol/L (ref 22–32)
Calcium: 9.1 mg/dL (ref 8.9–10.3)
Creatinine, Ser: 1.63 mg/dL — ABNORMAL HIGH (ref 0.61–1.24)
GFR calc Af Amer: 41 mL/min — ABNORMAL LOW (ref >60)
GFR, EST NON AFRICAN AMERICAN: 35 mL/min — AB (ref >60)
Glucose, Bld: 87 mg/dL (ref 70–99)
Potassium: 4.6 mmol/L (ref 3.5–5.1)
SODIUM: 134 mmol/L — AB (ref 135–145)
Total Bilirubin: 0.7 mg/dL (ref 0.3–1.2)
Total Protein: 8.2 g/dL — ABNORMAL HIGH (ref 6.5–8.1)

## 2018-09-25 MED ORDER — CALCIUM CARBONATE-VITAMIN D 500-200 MG-UNIT PO TABS: 1.0000 | ORAL_TABLET | Freq: Two times a day (BID) | ORAL | 6 refills | Status: AC

## 2018-09-25 NOTE — Patient Instructions (Signed)
To lab today  Start calcium with vitamin D 1 twice daily  Schedule PET scan at [facility]/pre-authorization  Return visit after PET scan done

## 2018-09-25 NOTE — Progress Notes (Signed)
Hematology and Oncology Follow Up Visit  [name] SARVIS, MD [id number] 20-Jan-1925 84 y.o. 09/25/2018 2:15 PM   Principle Diagnosis: Encounter Diagnoses  Name Primary?  . Prostate cancer ([place])   . Monoclonal gammopathy   . Rib pain on right side   . Open fracture of one rib of right side, initial encounter   . Multiple myeloma not having achieved remission [facility]) Yes  Clinical summary: 84 year old retired Engineer, civil (consulting).  He was initially diagnosed with an IgG kappa monoclonal gammopathy undetermined significance in July 2000 with a concomitant diagnosis of localized prostate cancer treated with radiofrequency ablation procedure at the same month. PSA has been stable over time and he has developed no signs of progression. IgG paraprotein has slowly risen over time but hemoglobin has remained stable and he has not developed any bone disease, hypercalcemia, or progressive renal dysfunction above and beyond his baseline CKD 3. He has chronic persistent atrial fibrillation and has reverted to atrial fibrillation despite 6 cardioversion procedures.  Heart rate controlled with amiodarone and low-dose Toprol-XL.  He is on chronic anticoagulation with age-adjusted apixaban 2.5 mg twice daily. He has asymptomatic aortic stenosis and mitral regurgitation.  Grade 3 diastolic dysfunction on December 2016 echocardiogram with preserved ejection fraction 60-65%. He has become increasingly unsteady on his feet and had a number of falls.  He required surgery for bilateral LeFort I nasal and maxillary sinus fractures and fractures of the bones of his left hand in July 2015.  Subsequent fall resulting in a right clavicle fracture in February 2016. He has had an extensive neurologic evaluation for resting/essential tremor.  Currently controlled on low-dose primidone with recent addition of low-dose Toprol-XL in July 2019. He developed a progressive respiratory illness, pneumonia, hematuria, recurrent resistant  Enterococcus faecalis urinary tract infection, and bladder outlet obstruction, requiring hospitalization in July 2018.  Renal function deteriorated with peak creatinine 1.8.  Stabilization over time down to current value of 1.5.  Lab reevaluation and skeletal bone survey done at time of his September 2019 visit.  Labs showing persistent trend for progressive elevation of total IgG paraprotein and kappa free light chains.  Bone survey did not show any obvious lytic lesions.  Generalized osteopenia.  Degenerative changes in the spine and multiple joints. 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  We discussed doing a bone marrow biopsy but the patient declined at that time.   Interim History: His daughter called me on Friday, November 9 to report that on Wednesday last week he bent down to pick up something and then developed sudden onset of right rib pain.  He had some narcotics left over from a previous hospitalization for trauma.  He started to take them for pain relief.  He alerted his daughter to the possibility that this might be a pathologic fracture related to his smoldering myeloma.  He felt comfortable waiting over the weekend since he had pain medicine on hand.  He came in for evaluation today.  Detailed rib films do show a new right seventh rib fracture.  Radiologist does not report that this is pathologic.  No other abnormalities noted. Pain has subsided over the weekend.  He is down to taking a rare dose of Dilaudid and is supplementing with Tylenol. I did not realize it but I  never started him on calcium and vitamin D after I got the last report of his bone x-rays.  I will start that at this time.  I will either add a bisphosphonate or denosumab within the next few weeks.   Medications:  reviewed  Allergies:  Allergies  Allergen Reactions  . Sulfa Antibiotics Swelling  . Chlorhexidine Rash  . Guaifenesin & Derivatives Itching  . Keflex [Cephalexin] Diarrhea  . Oxycodone Itching and Rash  . Oxycontin [Oxycodone Hcl] Itching and Rash  . Penicillins Other (See Comments)    Caused fungal reaction Has patient had a PCN reaction causing immediate rash, facial/tongue/throat swelling, SOB or lightheadedness with hypotension: No Has patient had a PCN reaction causing severe rash involving mucus membranes or skin necrosis: No Has patient had a PCN reaction that required hospitalization No Has patient had a PCN reaction occurring within the last 10 years: No If all of the above answers are "NO", then may proceed with Cephalosporin use.     Review of Systems: See interim history Remaining ROS negative:   Physical Exam: Blood pressure 131/87, pulse 60, temperature 98.1 F (36.7 C), temperature source Oral, weight 195 lb 3.2 oz (88.5 kg), SpO2 100 %. Wt Readings from Last 3 Encounters:  09/25/18 195 lb 3.2 oz (88.5 kg)  07/25/18 190 lb 14.4 oz (86.6 kg)  06/13/18 191 lb 12.8 oz (87 kg)     General appearance: Elderly Caucasian man in no distress HENNT: Pharynx no erythema, exudate, mass, or ulcer. No thyromegaly or thyroid nodules Lymph nodes: No cervical, supraclavicular, or axillary lymphadenopathy Breasts:  Lungs: Clear to auscultation, resonant to percussion throughout Heart: Regular rhythm, 2/6 mitral regurgitation murmur and 2/6 aortic systolic murmur, no gallop, no rub, no click, no edema Abdomen: Soft, nontender, normal bowel sounds, no mass, no organomegaly Extremities: Asymmetric 2+ right lower extremity edema edema, no calf tenderness Musculoskeletal: no joint deformities GU:  Vascular: Carotid pulses 2+, no bruits,  Neurologic: Alert, oriented, PERRLA, bilateral intraocular lenses, cranial nerves grossly normal, motor strength 5 over 5, reflexes 1+  symmetric, upper body coordination normal, gait normal, Skin: No rash; scattered ecchymosis on his arms.  Lab Results: CBC W/Diff    Component Value Date/Time   WBC 6.6 07/18/2018 1056   WBC 6.8 06/03/2017 0646   RBC 3.27 (L) 07/18/2018 1056   RBC 3.09 (L) 06/03/2017 0646   HGB 11.6 (L) 07/18/2018 1056   HGB 12.1 (L) 01/14/2014 0804   HCT 34.6 (L) 07/18/2018 1056   HCT 36.5 (L) 01/14/2014 0804   PLT 222 07/18/2018 1056   MCV 106 (H) 07/18/2018 1056   MCV 99.8 (H) 01/14/2014 0804   MCH 35.5 (H) 07/18/2018 1056   MCH 32.0 06/03/2017 0646   MCHC 33.5 07/18/2018 1056   MCHC 34.5 06/03/2017 0646   RDW 13.8 07/18/2018 1056   RDW 13.7 01/14/2014 0804   LYMPHSABS 2.0 07/18/2018 1056   LYMPHSABS 2.6 01/14/2014 0804   MONOABS 0.6 06/01/2017 1530   MONOABS 0.7 01/14/2014 0804   EOSABS 0.4 07/18/2018 1056   BASOSABS 0.0 07/18/2018 1056   BASOSABS 0.0 01/14/2014 0804     Chemistry      Component Value Date/Time   NA 134 (L) 09/25/2018 1132   NA 136 07/18/2018 1056   NA 139 01/14/2014 0804   K 4.6 09/25/2018 1132   K 4.8 01/14/2014 0804   CL 104 09/25/2018 1132   CL 107 03/19/2013 0949   CO2 26 09/25/2018 1132   CO2 25 01/14/2014  0804   BUN 17 09/25/2018 1132   BUN 14 07/18/2018 1056   BUN 16.7 01/14/2014 0804   CREATININE 1.63 (H) 09/25/2018 1132   CREATININE 1.63 (H) 12/09/2015 1215   CREATININE 1.7 (H) 01/14/2014 0804      Component Value Date/Time   CALCIUM 9.1 09/25/2018 1132   CALCIUM 9.4 01/14/2014 0804   ALKPHOS 38 09/25/2018 1132   ALKPHOS 45 01/14/2014 0804   AST 22 09/25/2018 1132   AST 21 01/14/2014 0804   ALT 20 09/25/2018 1132   ALT 20 01/14/2014 0804   BILITOT 0.7 09/25/2018 1132   BILITOT 0.5 07/18/2018 1056   BILITOT 0.71 01/14/2014 0804    Lab pending   Radiological Studies: Dg Ribs Unilateral W/chest Right  Result Date: 09/25/2018 CLINICAL DATA:  84 year old male with RIGHT rib pain. History of myeloma. EXAM: RIGHT RIBS AND CHEST - 3+ VIEW  COMPARISON:  None. FINDINGS: The cardiomediastinal silhouette is unremarkable. There is no evidence of focal airspace disease, pulmonary edema, suspicious pulmonary nodule/mass, pleural effusion, or pneumothorax. A nondisplaced acute fracture of the RIGHT 7th rib noted. No discrete focal bony lesions are present. IMPRESSION: Acute RIGHT 7th rib fracture. No pleural effusion or pneumothorax. No discrete focal bony lesions identified. Electronically Signed   By: Margarette Canada M.D.   On: 09/25/2018 10:32    Impression: 1.  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 Although the x-ray appearance does not appear to be grossly pathologic and he has known osteopenia, one would have to be concerned that this is in fact related to myeloma. He is still opposed to getting a bone marrow biopsy. Recent skeletal survey with no new lesions.  Additional imaging now recommended in our recent guidelines are either a total body CT scan, a total spine MRI, or a PET scan.  PET scan is exquisitely sensitive to bone marrow involvement and would require minimal time in the actual x-ray machine compared to an MRI of his spine.  I will try to get insurance approval to proceed with a PET scan. Repeat lab database today. Begin calcium and vitamin D supplement. Add subcutaneous denosumab soon. We discussed treatment of myeloma in the very elderly.  I think the treatment that would be the least toxic and provide a good partial response  would be single agent dexamethasone 20 mg weekly.  I would consider adding oral melphalan to this if the dexamethasone was otherwise tolerated.  Given his renal dysfunction and age, I think the toxicity of the imids would be prohibitive. I will arrange for short-term follow-up after the PET scan for further disposition.  2.  Chronic persistent atrial fibrillation He continues on antiarrhythmics and  anticoagulation  3.  Resting tremor Good response to low-dose primidone.  4.  Advanced degenerative arthritis  5.  Asymptomatic noncritical aortic stenosis  6.  Prostate cancer in long-term remission after initial local radiofrequency ablation procedure  7.  CKD 3   CC: Patient Care Team: Annia Belt, MD as PCP - General Stanford Breed Denice Bors, MD as PCP - Cardiology (Cardiology) Ronald Lobo, MD (Gastroenterology) Stanford Breed Denice Bors, MD as Consulting Physician (Cardiology) Irine Seal, MD as Attending Physician (Urology)   Murriel Hopper, MD, [place]  Hematology-Oncology/Internal Medicine     11/11/20192:15 PM

## 2018-09-28 LAB — CBC WITH DIFFERENTIAL/PLATELET
BASOS ABS: 0 10*3/uL (ref 0.0–0.2)
BASOS: 1 %
EOS (ABSOLUTE): 0.8 10*3/uL — AB (ref 0.0–0.4)
Eos: 10 %
Hematocrit: 34.3 % — ABNORMAL LOW (ref 37.5–51.0)
Hemoglobin: 11.9 g/dL — ABNORMAL LOW (ref 13.0–17.7)
IMMATURE GRANS (ABS): 0 10*3/uL (ref 0.0–0.1)
IMMATURE GRANULOCYTES: 0 %
LYMPHS: 29 %
Lymphocytes Absolute: 2.3 10*3/uL (ref 0.7–3.1)
MCH: 35.5 pg — AB (ref 26.6–33.0)
MCHC: 34.7 g/dL (ref 31.5–35.7)
MCV: 102 fL — AB (ref 79–97)
Monocytes Absolute: 0.7 10*3/uL (ref 0.1–0.9)
Monocytes: 9 %
NEUTROS PCT: 51 %
Neutrophils Absolute: 4.1 10*3/uL (ref 1.4–7.0)
PLATELETS: 234 10*3/uL (ref 150–450)
RBC: 3.35 x10E6/uL — ABNORMAL LOW (ref 4.14–5.80)
RDW: 12.3 % (ref 12.3–15.4)
WBC: 8 10*3/uL (ref 3.4–10.8)

## 2018-09-28 LAB — IMMUNOFIXATION ELECTROPHORESIS
IGG (IMMUNOGLOBIN G), SERUM: 2971 mg/dL — AB (ref 700–1600)
IgA/Immunoglobulin A, Serum: 65 mg/dL (ref 61–437)
IgM (Immunoglobulin M), Srm: 63 mg/dL (ref 15–143)
Total Protein: 7.7 g/dL (ref 6.0–8.5)

## 2018-09-28 LAB — KAPPA/LAMBDA LIGHT CHAINS
IG KAPPA FREE LIGHT CHAIN: 82 mg/L — AB (ref 3.3–19.4)
IG LAMBDA FREE LIGHT CHAIN: 10.3 mg/L (ref 5.7–26.3)
KAPPA/LAMBDA FLC RATIO: 7.96 — AB (ref 0.26–1.65)

## 2018-10-17 ENCOUNTER — Ambulatory Visit (HOSPITAL_COMMUNITY)
Admission: RE | Admit: 2018-10-17 | Discharge: 2018-10-17 | Disposition: A | Discharge status: Home / Self Care | Payer: Medicare Other | Source: Ambulatory Visit | Attending: Oncology | Admitting: Oncology

## 2018-10-17 DIAGNOSIS — R0781 Pleurodynia: Secondary | ICD-10-CM | POA: Diagnosis present

## 2018-10-17 DIAGNOSIS — S2231XB Fracture of one rib, right side, initial encounter for open fracture: Secondary | ICD-10-CM | POA: Insufficient documentation

## 2018-10-17 DIAGNOSIS — D472 Monoclonal gammopathy: Secondary | ICD-10-CM

## 2018-10-17 DIAGNOSIS — C9000 Multiple myeloma not having achieved remission: Secondary | ICD-10-CM | POA: Diagnosis present

## 2018-10-17 LAB — GLUCOSE, CAPILLARY: Glucose-Capillary: 88 mg/dL (ref 70–99)

## 2018-10-17 MED ORDER — FLUDEOXYGLUCOSE F - 18 (FDG) INJECTION
10.8000 | Freq: Once | INTRAVENOUS | Status: AC | PRN
  Administered 2018-10-17: 10.8 via INTRAVENOUS

## 2018-10-23 ENCOUNTER — Other Ambulatory Visit: Payer: Self-pay | Admitting: Cardiology

## 2018-10-23 DIAGNOSIS — I4891 Unspecified atrial fibrillation: Secondary | ICD-10-CM

## 2018-10-24 NOTE — Telephone Encounter (Signed)
°*  STAT* If patient is at the pharmacy, call can be transferred to refill team.   1. Which medications need to be refilled? (please list name of each medication and dose if known)  Amiodarone-please call today if possible please  2. Which pharmacy/location (including street and city if local pharmacy) is medication to be sent to? [facility]- [phone]  3. Do they need a 30 day or 90 day supply? 90 and refills

## 2018-10-25 ENCOUNTER — Other Ambulatory Visit: Payer: Self-pay | Admitting: Cardiology

## 2018-10-25 DIAGNOSIS — I4891 Unspecified atrial fibrillation: Secondary | ICD-10-CM

## 2018-10-25 MED ORDER — AMIODARONE HCL 200 MG PO TABS: 200.0000 mg | ORAL_TABLET | Freq: Every day | ORAL | 3 refills | Status: DC

## 2018-10-25 NOTE — Telephone Encounter (Signed)
 *  STAT* If patient is at the pharmacy, call can be transferred to refill team.   1. Which medications need to be refilled? (please list name of each medication and dose if known) amiodarone (PACERONE) 200 MG tablet  2. Which pharmacy/location (including street and city if local pharmacy) is medication to be sent to? [facility]  3. Do they need a 30 day or 90 day supply? 68  Caregiver states Dr [name] is completely out of his medication and this is the 2nd request for refill.

## 2018-10-25 NOTE — Telephone Encounter (Signed)
Rx request sent to pharmacy.  

## 2018-10-25 NOTE — Telephone Encounter (Signed)
Follow Up:     Please call asap-pt needs his medicine. Please let him know when you have called this in.

## 2018-10-25 NOTE — Telephone Encounter (Signed)
Refill sent to the pharmacy electronically.  

## 2018-11-16 ENCOUNTER — Other Ambulatory Visit: Payer: Self-pay | Admitting: Oncology

## 2018-12-01 ENCOUNTER — Telehealth: Payer: Self-pay | Admitting: Oncology

## 2018-12-01 NOTE — Telephone Encounter (Signed)
Needs refills on benzonatate 100mg  [facility] [place], [place] AT [address]  ;pt contact [phone]

## 2018-12-05 ENCOUNTER — Other Ambulatory Visit (HOSPITAL_COMMUNITY): Payer: Self-pay | Admitting: Oncology

## 2018-12-05 MED ORDER — BENZONATATE 100 MG PO CAPS: 100.0000 mg | ORAL_CAPSULE | Freq: Three times a day (TID) | ORAL | 1 refills | Status: AC | PRN

## 2018-12-05 NOTE — Telephone Encounter (Signed)
Let them know I sent Rx to his pharmacist - FYI: this was not on his med list - probably prescribed over a year ago when he had pneumonia.

## 2018-12-08 NOTE — Progress Notes (Signed)
HPI: Follow-up atrial fibrillation. Had cardiac catheterization in August 2011 that showed nonobstructive coronary disease. Abdominal ultrasound July 2014 showed no aneurysm. Echocardiogram repeated 2/18. LV function normal; mild AS, mild to moderate MR, moderate LAE and mild to moderate AI. Patient had successful cardioversion again on 02/24/2017.Holter monitor December 2018 showed atrial fibrillation with elevated rate. Toprol 25 mg daily added.  PET scan December 2019 showed enlargement of the pulmonary artery, 3 mm right lung nodule 4.1 cm ascending thoracic aorta.  Since last seen,he has some weakness but denies dyspnea, chest pain, palpitations or syncope.  Mild chronic pedal edema.  Current Outpatient Medications  Medication Sig Dispense Refill  . amiodarone (PACERONE) 200 MG tablet TAKE 1 TABLET BY MOUTH ONCE DAILY 90 tablet 0  . atorvastatin (LIPITOR) 10 MG tablet TAKE 1 TABLET BY MOUTH EVERY DAY 90 tablet 3  . benzonatate (TESSALON PERLES) 100 MG capsule Take 1 capsule (100 mg total) by mouth 3 (three) times daily as needed for cough. 30 capsule 1  . calcium-vitamin D (OSCAL WITH D) 500-200 MG-UNIT tablet Take 1 tablet by mouth 2 (two) times daily. 60 tablet 6  . ELIQUIS 2.5 MG TABS tablet TAKE 1 TABLET BY MOUTH TWICE DAILY 180 tablet 3  . metoprolol succinate (TOPROL XL) 25 MG 24 hr tablet Take 1 tablet (25 mg total) by mouth daily. (Patient taking differently: Take 12.5 mg by mouth daily. Pt takes 12.5mg  daily) 90 tablet 3  . primidone (MYSOLINE) 50 MG tablet TAKE 1 TABLET BY MOUTH TWICE DAILY( 10:00 AM AND 5:00 PM) 60 tablet 6   No current facility-administered medications for this visit.      Past Medical History:  Diagnosis Date  . Acute bronchitis treated with antibiotics in the past 60 days 05/25/2017   Persistent cough despite two courses of antibiotics 05/25/17  . Anemia   . Aortic insufficiency    Mild, echo, July, 2011  . Aortic stenosis    Mild, echo, July,  2011  . Atrial fibrillation [facility]) 2011, 2015   Multaq Started September, 2011, dose adjusted October, 2011, 200 mg a.m., 400 mg p.o.  . Bradycardia    Sinus bradycardia while on 25 Lopressor twice a day October, 2013  . Carotid artery disease ([place])    Doppler, 2008 no significant abnormality  . Cervical spine disease   . Chronic anticoagulation 01/27/2015   pradaxa 75 mg BID for A fib  . Coronary artery disease    minimal, cath 2005 / catheterization August, 2011, 30% in 3 vessels, normal LV function  . Diarrhea    Chronic  . Drug therapy    Pradaxa Started July, 2011  . Ejection fraction    EF 60%, echo, 2009  /  TEE normal August, 2011 /   EF 60%, echo, July, 2011  . GERD (gastroesophageal reflux disease)    Esophageal dilatation in 1992, some esophageal spasm  . Hyperlipidemia   . Incomplete right bundle branch block    August, 2012  . Leg fatigue    June, 2014  . Memory change    June, 2014  . Monoclonal gammopathy    granfortuna  . MR (mitral regurgitation) 2009   mild, Echo, July, 2011  . Painless hematuria 03/27/2012   Occurred x 2 5/12 & 5/13 on Pradaxa 75 mg BID  . Prostate cancer ([place]) 11/22/2011  . Renal insufficiency    Prior creatinine 1.2 /  September, 2011.. 1.7  /  October, 2011.. 1.8  .  Rib pain on right side 09/25/2018   Suspect path fx 09/23/18  . Right rib fracture 09/25/2018   09/25/18 non traumatic; not obviously pathologic  . S/P transurethral resection of prostate 2004   2004  . TIA (transient ischemic attack)    Dr. Erling Cruz,, question in the past. when off ASA for 10 days    Past Surgical History:  Procedure Laterality Date  . CARDIAC CATHETERIZATION    . CARDIOVERSION  08/01/2012   Procedure: CARDIOVERSION;  Surgeon: Carlena Bjornstad, MD;  Location: [facility] ENDOSCOPY;  Service: Cardiovascular;  Laterality: N/A;  . CARDIOVERSION N/A 11/29/2013   Procedure: CARDIOVERSION;  Surgeon: Carlena Bjornstad, MD;  Location: [facility] ENDOSCOPY;  Service: Cardiovascular;   Laterality: N/A;  . CARDIOVERSION N/A 11/27/2014   Procedure: CARDIOVERSION;  Surgeon: Carlena Bjornstad, MD;  Location: [facility] ENDOSCOPY;  Service: Cardiovascular;  Laterality: N/A;  . CARDIOVERSION N/A 03/12/2015   Procedure: CARDIOVERSION;  Surgeon: Carlena Bjornstad, MD;  Location: [place];  Service: Cardiovascular;  Laterality: N/A;  . CARDIOVERSION N/A 12/12/2015   Procedure: CARDIOVERSION;  Surgeon: Fay Records, MD;  Location: [place];  Service: Cardiovascular;  Laterality: N/A;  . CARDIOVERSION N/A 02/24/2017   Procedure: CARDIOVERSION;  Surgeon: Pixie Casino, MD;  Location: [facility] ENDOSCOPY;  Service: Cardiovascular;  Laterality: N/A;  . CATARACT EXTRACTION    . COLONOSCOPY    . EYE SURGERY Bilateral    cataract extraction  . Fibular fracture repair    . HERNIA REPAIR    . History of TURP    . OPEN REDUCTION INTERNAL FIXATION (ORIF) METACARPAL Left 06/22/2014   Procedure: OPEN REDUCTION INTERNAL FIXATION (ORIF) LEFT HAND METACARPAL;  Surgeon: Roseanne Kaufman, MD;  Location: [place];  Service: Orthopedics;  Laterality: Left;  . ORIF ORBITAL FRACTURE Bilateral 06/22/2014   Procedure: OPEN REDUCTION INTERNAL FIXATION (ORIF) BILATERAL LEFORTE1 FRACTURE OF MAXILLA, HYBRID ARCH BARS ;  Surgeon: Izora Gala, MD;  Location: [place];  Service: ENT;  Laterality: Bilateral;  . SHOULDER ARTHROSCOPY WITH ROTATOR CUFF REPAIR Right 01/23/2014   DR Noemi Chapel   . SHOULDER ARTHROSCOPY WITH ROTATOR CUFF REPAIR Right 01/23/2014   Procedure: RIGHT SHOULDER ARTHROSCOPY WITH DEBRIDEMENT AND ROTATOR CUFF REPAIR;  Surgeon: Lorn Junes, MD;  Location: [place];  Service: Orthopedics;  Laterality: Right;    Social History   Socioeconomic History  . Marital status: Widowed    Spouse name: Not on file  . Number of children: Not on file  . Years of education: Not on file  . Highest education level: Not on file  Occupational History  . Occupation: retired    Comment: physician  Social Needs  . Financial resource  strain: Not on file  . Food insecurity:    Worry: Not on file    Inability: Not on file  . Transportation needs:    Medical: Not on file    Non-medical: Not on file  Tobacco Use  . Smoking status: Former Research scientist (life sciences)  . Smokeless tobacco: Never Used  Substance and Sexual Activity  . Alcohol use: Yes    Alcohol/week: 2.0 standard drinks    Types: 1 Glasses of wine, 1 Shots of liquor per week    Comment: Evenings.  . Drug use: No  . Sexual activity: Not Currently  Lifestyle  . Physical activity:    Days per week: Not on file    Minutes per session: Not on file  . Stress: Not on file  Relationships  . Social connections:  Talks on phone: Not on file    Gets together: Not on file    Attends religious service: Not on file    Active member of club or organization: Not on file    Attends meetings of clubs or organizations: Not on file    Relationship status: Not on file  . Intimate partner violence:    Fear of current or ex partner: Not on file    Emotionally abused: Not on file    Physically abused: Not on file    Forced sexual activity: Not on file  Other Topics Concern  . Not on file  Social History Narrative  . Not on file    History reviewed. No pertinent family history.  ROS: Weakness but no fevers or chills, productive cough, hemoptysis, dysphasia, odynophagia, melena, hematochezia, dysuria, hematuria, rash, seizure activity, orthopnea, PND, claudication. Remaining systems are negative.  Physical Exam: Well-developed well-nourished in no acute distress.  Skin is warm and dry.  HEENT is normal.  Neck is supple.  Chest is clear to auscultation with normal expansion.  Cardiovascular exam is irregular, 2/6 systolic murmur Abdominal exam nontender or distended. No masses palpated. Extremities show trace edema. neuro grossly intact  ECG-atrial fibrillation at a rate of 88, left axis deviation, right bundle branch block.  Personally reviewed  A/P  1 permanent atrial  fibrillation-continue low-dose Toprol and amiodarone for rate control.  Patient did not tolerate higher doses of Toprol previously and therefore amiodarone has been continued to assist with rate control.  Continue apixaban.  TSH September 2019 normal.  Liver functions November 2019 normal.  Creatinine 1.63.  Hemoglobin 11.9.  He is scheduled to have blood work in the near future.  We will make sure that his TSH is checked along with his liver functions, renal function and hemoglobin.  2 history of mild aortic stenosis/mild to moderate aortic insufficiency-plan conservative measures given age and comorbidities.  3 hyperlipidemia-continue statin.  4 history of nonobstructive coronary disease-continue statin.  No aspirin given need for apixaban.  5 thoracic aortic aneurysm-4.1 cm on recent scan.  He would not be a candidate for surgical intervention.  I will not pursue further imaging.  Kirk Ruths, MD

## 2018-12-15 ENCOUNTER — Other Ambulatory Visit: Payer: Self-pay | Admitting: Oncology

## 2018-12-15 DIAGNOSIS — D472 Monoclonal gammopathy: Secondary | ICD-10-CM

## 2018-12-18 ENCOUNTER — Ambulatory Visit: Payer: Medicare Other | Admitting: Cardiology

## 2018-12-19 ENCOUNTER — Encounter: Payer: Self-pay | Admitting: Cardiology

## 2018-12-19 ENCOUNTER — Ambulatory Visit: Payer: Medicare Other | Admitting: Cardiology

## 2018-12-19 ENCOUNTER — Other Ambulatory Visit: Payer: Self-pay | Admitting: Oncology

## 2018-12-19 ENCOUNTER — Encounter

## 2018-12-19 VITALS — BP 126/86 | HR 88 | Ht 67.0 in | Wt 189.0 lb

## 2018-12-19 DIAGNOSIS — I2510 Atherosclerotic heart disease of native coronary artery without angina pectoris: Secondary | ICD-10-CM | POA: Diagnosis not present

## 2018-12-19 DIAGNOSIS — I350 Nonrheumatic aortic (valve) stenosis: Secondary | ICD-10-CM

## 2018-12-19 DIAGNOSIS — I480 Paroxysmal atrial fibrillation: Secondary | ICD-10-CM

## 2018-12-19 DIAGNOSIS — Z79899 Other long term (current) drug therapy: Secondary | ICD-10-CM

## 2018-12-19 NOTE — Patient Instructions (Signed)
Medication Instructions:  NO CHANGE If you need a refill on your cardiac medications before your next appointment, please call your pharmacy.   Lab work: Your physician recommends that you return for lab work [place] If you have labs (blood work) drawn today and your tests are completely normal, you will receive your results only by: Marland Kitchen MyChart Message (if you have MyChart) OR . A paper copy in the mail If you have any lab test that is abnormal or we need to change your treatment, we will call you to review the results.  Follow-Up: At [facility], you and your health needs are our priority.  As part of our continuing mission to provide you with exceptional heart care, we have created designated Provider Care Teams.  These Care Teams include your primary Cardiologist (physician) and Advanced Practice Providers (APPs -  Physician Assistants and Nurse Practitioners) who all work together to provide you with the care you need, when you need it. You will need a follow up appointment in 6 months.  Please call our office 2 months in advance to schedule this appointment.  You may see Kirk Ruths, MD or one of the following Advanced Practice Providers on your designated Care Team:   Kerin Ransom, PA-C Roby Lofts, [place] . Sande Rives, PA-C

## 2018-12-26 ENCOUNTER — Other Ambulatory Visit: Payer: Self-pay | Admitting: Oncology

## 2019-01-19 ENCOUNTER — Other Ambulatory Visit: Payer: Self-pay | Admitting: *Deleted

## 2019-01-19 MED ORDER — METOPROLOL SUCCINATE ER 25 MG PO TB24: 12.5000 mg | ORAL_TABLET | Freq: Every day | ORAL | 3 refills | Status: DC

## 2019-01-26 ENCOUNTER — Telehealth: Payer: Self-pay | Admitting: *Deleted

## 2019-01-26 ENCOUNTER — Other Ambulatory Visit: Payer: Self-pay | Admitting: Oncology

## 2019-01-26 MED ORDER — HYDROMORPHONE HCL 2 MG PO TABS: 2.0000 mg | ORAL_TABLET | Freq: Four times a day (QID) | ORAL | 0 refills | Status: AC | PRN

## 2019-01-26 NOTE — Telephone Encounter (Signed)
Call pt: Rx sent electronically to his pharmacy

## 2019-01-26 NOTE — Telephone Encounter (Signed)
Message  from pt's aide, Tasha. Return call - spoke to Dr [name], stated he had email Dr Beryle Beams about pain medication. Stated he felled a week ago and fx rib. Stated he preferred Dilaudid 2 mg vs Morphine. And his daughter will pick up rx.

## 2019-01-26 NOTE — Telephone Encounter (Signed)
[facility] send [facility] to [facility]

## 2019-01-30 ENCOUNTER — Encounter: Payer: Self-pay | Admitting: *Deleted

## 2019-01-30 ENCOUNTER — Ambulatory Visit (INDEPENDENT_AMBULATORY_CARE_PROVIDER_SITE_OTHER): Payer: Medicare Other | Admitting: Oncology

## 2019-01-30 ENCOUNTER — Other Ambulatory Visit: Payer: Self-pay

## 2019-01-30 ENCOUNTER — Encounter: Payer: Self-pay | Admitting: Oncology

## 2019-01-30 VITALS — BP 135/76 | HR 68 | Temp 98.1°F | Wt 195.2 lb

## 2019-01-30 DIAGNOSIS — I4811 Longstanding persistent atrial fibrillation: Secondary | ICD-10-CM | POA: Diagnosis not present

## 2019-01-30 DIAGNOSIS — Z91048 Other nonmedicinal substance allergy status: Secondary | ICD-10-CM

## 2019-01-30 DIAGNOSIS — Z923 Personal history of irradiation: Secondary | ICD-10-CM

## 2019-01-30 DIAGNOSIS — I350 Nonrheumatic aortic (valve) stenosis: Secondary | ICD-10-CM | POA: Diagnosis not present

## 2019-01-30 DIAGNOSIS — I340 Nonrheumatic mitral (valve) insufficiency: Secondary | ICD-10-CM

## 2019-01-30 DIAGNOSIS — Z888 Allergy status to other drugs, medicaments and biological substances status: Secondary | ICD-10-CM

## 2019-01-30 DIAGNOSIS — G250 Essential tremor: Secondary | ICD-10-CM | POA: Diagnosis not present

## 2019-01-30 DIAGNOSIS — Z880 Allergy status to penicillin: Secondary | ICD-10-CM

## 2019-01-30 DIAGNOSIS — I4821 Permanent atrial fibrillation: Secondary | ICD-10-CM | POA: Diagnosis not present

## 2019-01-30 DIAGNOSIS — Z79899 Other long term (current) drug therapy: Secondary | ICD-10-CM

## 2019-01-30 DIAGNOSIS — M8580 Other specified disorders of bone density and structure, unspecified site: Secondary | ICD-10-CM

## 2019-01-30 DIAGNOSIS — D472 Monoclonal gammopathy: Secondary | ICD-10-CM

## 2019-01-30 DIAGNOSIS — Z885 Allergy status to narcotic agent status: Secondary | ICD-10-CM

## 2019-01-30 DIAGNOSIS — I351 Nonrheumatic aortic (valve) insufficiency: Secondary | ICD-10-CM | POA: Diagnosis not present

## 2019-01-30 DIAGNOSIS — Z8781 Personal history of (healed) traumatic fracture: Secondary | ICD-10-CM

## 2019-01-30 DIAGNOSIS — S2232XA Fracture of one rib, left side, initial encounter for closed fracture: Secondary | ICD-10-CM | POA: Insufficient documentation

## 2019-01-30 DIAGNOSIS — R011 Cardiac murmur, unspecified: Secondary | ICD-10-CM

## 2019-01-30 DIAGNOSIS — Z8546 Personal history of malignant neoplasm of prostate: Secondary | ICD-10-CM

## 2019-01-30 DIAGNOSIS — Z7901 Long term (current) use of anticoagulants: Secondary | ICD-10-CM

## 2019-01-30 DIAGNOSIS — Z9181 History of falling: Secondary | ICD-10-CM

## 2019-01-30 DIAGNOSIS — N183 Chronic kidney disease, stage 3 (moderate): Secondary | ICD-10-CM

## 2019-01-30 DIAGNOSIS — C61 Malignant neoplasm of prostate: Secondary | ICD-10-CM

## 2019-01-30 DIAGNOSIS — Z881 Allergy status to other antibiotic agents status: Secondary | ICD-10-CM

## 2019-01-30 NOTE — Patient Instructions (Signed)
To lab today  Referral made to Dr Julieanne Manson for your ongoing Hematology care.  Stay well - I will keep in touch

## 2019-01-30 NOTE — Progress Notes (Signed)
Hematology and Oncology Follow Up Visit  [name] [name] [id number] 03/10/1925 85 y.o. 01/30/2019 1:08 PM   Principle Diagnosis: Encounter Diagnoses  Name Primary?  . Nonrheumatic aortic valve insufficiency   . Nonrheumatic aortic valve stenosis   . Longstanding persistent atrial fibrillation   . On amiodarone therapy   . Monoclonal gammopathy Yes  . Prostate cancer ([name] [name])   . 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 PSA has been stable over time and he has developed no signs of progression. IgG paraprotein has slowly risen over time but hemoglobin has remained stable and he has not developed any bone disease, hypercalcemia, or progressive renal dysfunction above and beyond his baseline CKD 3. He has chronic persistent atrial fibrillation and has reverted to atrial fibrillation despite 6 cardioversion procedures. Heart rate controlled with amiodarone and low-dose Toprol-XL. He is on chronic anticoagulation with age-adjusted apixaban 2.5 mg twice daily. He has asymptomatic aortic stenosis and mitral regurgitation. Grade 3 diastolic dysfunction on December 2016 echocardiogram with preserved ejection fraction 60-65%. He has become increasingly unsteady on his feet and had a number of falls. He required surgery for bilateral LeFort I nasal and maxillary sinus fractures and fractures of the bones of his left hand in July 2015. Subsequent fall resulting in a right clavicle fracture in February 2016. He has had an extensive neurologic evaluation for resting/essential tremor. Currently controlled on low-dose primidone with recent addition of low-dose Toprol-XL in July 2019. He developed a  progressive respiratory illness, pneumonia, hematuria, recurrent resistant Enterococcus faecalis urinary tract infection, and bladder outlet obstruction, requiring hospitalization in July 2018. Renal function deteriorated with peak creatinine 1.8. Stabilization over time down to current value of 1.5.  Lab reevaluation and skeletal bone survey done at time of his September 2019 visit.  Labs showing persistent trend for progressive elevation of total IgG paraprotein and kappa free light chains.  Bone survey did not show any obvious lytic lesions.  Generalized osteopenia.  Degenerative changes in the spine and multiple joints. 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  We discussed doing a bone marrow biopsy but the patient declined at that time.  He fell down and fractured a right seventh rib in November 2019.  Serum IgG and free light chains were slowly increasing at that time.  Although fracture did not appear pathologic I was concerned that he had converted from MGUS to active myeloma.  I obtained a PET scan on December 3.  I was pleasantly surprised.  There did not seem to be any hypermetabolic disease in his marrow.  Uptake in the lateral seventh right rib felt to be related to healing.  No suspicion that this was a pathologic fracture.  Interim History: He fell down again about 10 days ago on March 6.  He thinks he broke a lower posterior left rib.  He was reluctant to let me know since he did not want to come in for an x-ray.  He did call me on Thursday, March 12.  He was experiencing significant pain.  I prescribed  a narcotic that he has tolerated in the past, Dilaudid.  Codeine preparations resulted in dystonic reactions and sedation.  In addition, he is on chronic anticoagulation. He is still having a  moderate amount of pain but it is slowly improving.  No dyspnea. No new cardiac symptoms.  He has learned to live with his atrial fibrillation. He has noted some increased swelling in his ankles right greater than left.  Medications: reviewed  Allergies:  Allergies  Allergen Reactions  . Sulfa Antibiotics Swelling  . Chlorhexidine Rash  . Guaifenesin & Derivatives Itching  . Keflex [Cephalexin] Diarrhea  . Oxycodone Itching and Rash  . Oxycontin [Oxycodone Hcl] Itching and Rash  . Penicillins Other (See Comments)    Caused fungal reaction Has patient had a PCN reaction causing immediate rash, facial/tongue/throat swelling, SOB or lightheadedness with hypotension: No Has patient had a PCN reaction causing severe rash involving mucus membranes or skin necrosis: No Has patient had a PCN reaction that required hospitalization No Has patient had a PCN reaction occurring within the last 10 years: No If all of the above answers are "NO", then may proceed with Cephalosporin use.     Review of Systems: See interim history Remaining ROS negative:   Physical Exam: Blood pressure 135/76, pulse 68, temperature 98.1 F (36.7 C), temperature source Oral, weight 195 lb 3.2 oz (88.5 kg), SpO2 97 %. Wt Readings from Last 3 Encounters:  01/30/19 195 lb 3.2 oz (88.5 kg)  12/19/18 189 lb (85.7 kg)  09/25/18 195 lb 3.2 oz (88.5 kg)     General appearance: Frail elderly Caucasian man HENNT: Pharynx no erythema, exudate, mass, or ulcer. No thyromegaly or thyroid nodules Lymph nodes: No cervical, supraclavicular, or axillary lymphadenopathy, previous lymph node palpable in the left axilla less prominent and approximately 1 cm at this time Breasts: Lungs: Clear to auscultation, resonant to percussion throughout Heart: Irregularly irregular rhythm, 2/6 aortic and 2/6 mitral  murmur, no gallop, no rub, no click, asymmetric 1+ edema right ankle greater than left.   Abdomen: Soft, nontender, normal  bowel sounds, no mass, no organomegaly Extremities: No edema, no calf tenderness Musculoskeletal: no joint deformities GU:  Vascular: Carotid pulses 2+, no bruits, Neurologic: Alert, oriented, PERRLA, , cranial nerves grossly normal, motor strength 5 over 5, reflexes 1+ symmetric, upper body coordination normal, gait normal, Skin: No rash.  Scattered ecchymosis on the skin of his forearms.  Lab Results: CBC W/Diff    Component Value Date/Time   WBC 8.0 09/25/2018 1132   WBC 6.8 06/03/2017 0646   RBC 3.35 (L) 09/25/2018 1132   RBC 3.09 (L) 06/03/2017 0646   HGB 11.9 (L) 09/25/2018 1132   HGB 12.1 (L) 01/14/2014 0804   HCT 34.3 (L) 09/25/2018 1132   HCT 36.5 (L) 01/14/2014 0804   PLT 234 09/25/2018 1132   MCV 102 (H) 09/25/2018 1132   MCV 99.8 (H) 01/14/2014 0804   MCH 35.5 (H) 09/25/2018 1132   MCH 32.0 06/03/2017 0646   MCHC 34.7 09/25/2018 1132   MCHC 34.5 06/03/2017 0646   RDW 12.3 09/25/2018 1132   RDW 13.7 01/14/2014 0804   LYMPHSABS 2.3 09/25/2018 1132   LYMPHSABS 2.6 01/14/2014 0804   MONOABS 0.6 06/01/2017 1530   MONOABS 0.7 01/14/2014 0804   EOSABS 0.8 (H) 09/25/2018 1132   BASOSABS 0.0 09/25/2018 1132   BASOSABS 0.0 01/14/2014 0804     Chemistry      Component Value Date/Time   NA 134 (L) 09/25/2018 1132  NA 136 07/18/2018 1056   NA 139 01/14/2014 0804   K 4.6 09/25/2018 1132   K 4.8 01/14/2014 0804   CL 104 09/25/2018 1132   CL 107 03/19/2013 0949   CO2 26 09/25/2018 1132   CO2 25 01/14/2014 0804   BUN 17 09/25/2018 1132   BUN 14 07/18/2018 1056   BUN 16.7 01/14/2014 0804   CREATININE 1.63 (H) 09/25/2018 1132   CREATININE 1.63 (H) 12/09/2015 1215   CREATININE 1.7 (H) 01/14/2014 0804      Component Value Date/Time   CALCIUM 9.1 09/25/2018 1132   CALCIUM 9.4 01/14/2014 0804   ALKPHOS 38 09/25/2018 1132   ALKPHOS 45 01/14/2014 0804   AST 22 09/25/2018 1132   AST 21 01/14/2014 0804   ALT 20 09/25/2018 1132   ALT 20 01/14/2014 0804   BILITOT 0.7  09/25/2018 1132   BILITOT 0.5 07/18/2018 1056   BILITOT 0.71 01/14/2014 0804    Most recent IgG 2.9 g on September 25, 2018 compared with 3.0 g in September.  Today's value is pending. Free light chain ratio 7.96 in November compared with 8.7 in September.  Today's value is pending.   Radiological Studies: No results found.  Impression: 1.  IgG kappa monoclonal gammopathy undetermined significance versus smoldering myeloma.  No gross endorgan damage at this time.  IgG level hovering around 3 g and free light chain ratio around 8.  No definite bone marrow or bone lesions on recent October 17, 2018 PET scan. Plan: Continue survey labs every 3 months.  Bone survey every 6 months.  2.  Refractory atrial fibrillation now status post cardioversion x5.  He will continue medical therapy with amiodarone.  Periodic thyroid function checks.  Most recent July 18, 2018 TSH 1.9.  Repeat value today pending.  3.  Early stage prostate cancer treated with a ablation procedure approximately in July 2000 around the same time he was diagnosed with the MGUS.  Overall stable PSA over time.  Most recent value 2.2 on July 18, 2018.  Repeat value today pending.  4.  Aortic stenosis and mitral regurgitation asymptomatic  5.  Benign essential tremor controlled with primidone 50 mg twice daily and Toprol-XL 25 mg daily  6.  CKD 3  I will transition his hematology care to Dr. Julieanne Manson at this time.  Repeat lab today.  If otherwise stable, then continue surveillance program as outlined in #1 above.   CC: Patient Care Team: Annia Belt, MD as PCP - General Stanford Breed Denice Bors, MD as PCP - Cardiology (Cardiology) Ronald Lobo, MD (Gastroenterology) Stanford Breed Denice Bors, MD as Consulting Physician (Cardiology) Irine Seal, MD as Attending Physician (Urology)   Murriel Hopper, MD, [place]  Hematology-Oncology/Internal Medicine     3/17/20201:08 PM

## 2019-01-31 LAB — IMMUNOFIXATION ELECTROPHORESIS
IgA/Immunoglobulin A, Serum: 68 mg/dL (ref 61–437)
IgG (Immunoglobin G), Serum: 3264 mg/dL — ABNORMAL HIGH (ref 700–1600)
IgM (Immunoglobulin M), Srm: 70 mg/dL (ref 15–143)
Total Protein: 7.7 g/dL (ref 6.0–8.5)

## 2019-01-31 LAB — CBC WITH DIFFERENTIAL/PLATELET
Basophils Absolute: 0.1 10*3/uL (ref 0.0–0.2)
Basos: 1 %
EOS (ABSOLUTE): 1.1 10*3/uL — ABNORMAL HIGH (ref 0.0–0.4)
Eos: 15 %
Hematocrit: 33.9 % — ABNORMAL LOW (ref 37.5–51.0)
Hemoglobin: 11.5 g/dL — ABNORMAL LOW (ref 13.0–17.7)
Immature Grans (Abs): 0 10*3/uL (ref 0.0–0.1)
Immature Granulocytes: 0 %
Lymphocytes Absolute: 2 10*3/uL (ref 0.7–3.1)
Lymphs: 27 %
MCH: 35.6 pg — ABNORMAL HIGH (ref 26.6–33.0)
MCHC: 33.9 g/dL (ref 31.5–35.7)
MCV: 105 fL — ABNORMAL HIGH (ref 79–97)
Monocytes Absolute: 0.6 10*3/uL (ref 0.1–0.9)
Monocytes: 8 %
Neutrophils Absolute: 3.7 10*3/uL (ref 1.4–7.0)
Neutrophils: 49 %
Platelets: 222 10*3/uL (ref 150–450)
RBC: 3.23 x10E6/uL — ABNORMAL LOW (ref 4.14–5.80)
RDW: 12.1 % (ref 11.6–15.4)
WBC: 7.5 10*3/uL (ref 3.4–10.8)

## 2019-01-31 LAB — COMPREHENSIVE METABOLIC PANEL
A/G RATIO: 0.8 — AB (ref 1.2–2.2)
ALT: 16 IU/L (ref 0–44)
AST: 16 IU/L (ref 0–40)
Albumin: 3.5 g/dL (ref 3.5–4.6)
Alkaline Phosphatase: 74 IU/L (ref 39–117)
BILIRUBIN TOTAL: 0.4 mg/dL (ref 0.0–1.2)
BUN/Creatinine Ratio: 12 (ref 10–24)
BUN: 20 mg/dL (ref 10–36)
CHLORIDE: 103 mmol/L (ref 96–106)
CO2: 21 mmol/L (ref 20–29)
Calcium: 9.1 mg/dL (ref 8.6–10.2)
Creatinine, Ser: 1.63 mg/dL — ABNORMAL HIGH (ref 0.76–1.27)
GFR calc Af Amer: 42 mL/min/{1.73_m2} — ABNORMAL LOW (ref >59)
GFR calc non Af Amer: 36 mL/min/{1.73_m2} — ABNORMAL LOW (ref >59)
Globulin, Total: 4.2 g/dL (ref 1.5–4.5)
Glucose: 107 mg/dL — ABNORMAL HIGH (ref 65–99)
Potassium: 4.8 mmol/L (ref 3.5–5.2)
Sodium: 136 mmol/L (ref 134–144)

## 2019-01-31 LAB — KAPPA/LAMBDA LIGHT CHAINS
IG LAMBDA FREE LIGHT CHAIN: 10.6 mg/L (ref 5.7–26.3)
Ig Kappa Free Light Chain: 103.8 mg/L — ABNORMAL HIGH (ref 3.3–19.4)
KAPPA/LAMBDA FLC RATIO: 9.79 — AB (ref 0.26–1.65)

## 2019-01-31 LAB — TSH: TSH: 1.57 u[IU]/mL (ref 0.450–4.500)

## 2019-01-31 LAB — PSA: Prostate Specific Ag, Serum: 2.5 ng/mL (ref 0.0–4.0)

## 2019-02-05 ENCOUNTER — Encounter: Payer: Self-pay | Admitting: Oncology

## 2019-03-16 ENCOUNTER — Telehealth: Payer: Self-pay | Admitting: Oncology

## 2019-03-16 NOTE — Telephone Encounter (Signed)
Received a call from the pt's caregiver to cancel appt w/Dr. Benay Spice on 5/5.

## 2019-03-20 ENCOUNTER — Other Ambulatory Visit: Payer: Medicare Other

## 2019-03-20 ENCOUNTER — Ambulatory Visit: Payer: Medicare Other | Admitting: Oncology

## 2019-06-01 ENCOUNTER — Other Ambulatory Visit: Payer: Self-pay | Admitting: Cardiology

## 2019-06-01 MED ORDER — METOPROLOL SUCCINATE ER 25 MG PO TB24: 12.5000 mg | ORAL_TABLET | Freq: Every day | ORAL | 0 refills | Status: DC

## 2019-06-01 NOTE — Telephone Encounter (Signed)
Outpatient Medication Detail   Disp Refills Start End   metoprolol succinate (TOPROL XL) 25 MG 24 hr tablet 90 tablet 3 01/19/2019    Sig - Route: Take 0.5 tablets (12.5 mg total) by mouth daily. - Oral   Sent to pharmacy as: metoprolol succinate (TOPROL XL) 25 MG 24 hr tablet   E-Prescribing Status: Receipt confirmed by pharmacy (01/19/2019 10:09 AM EST)   Pharmacy  [id number] - [place], [place] - [address] AT [place]

## 2019-06-01 NOTE — Telephone Encounter (Signed)
Metoprolol succinate 12.5 mg daily refilled.

## 2019-06-01 NOTE — Telephone Encounter (Signed)
°*  STAT* If patient is at the pharmacy, call can be transferred to refill team.   1. Which medications need to be refilled? (please list name of each medication and dose if known) metoprolol succinate 25   2. Which pharmacy/location (including street and city if local pharmacy) is medication to be sent to? [facility] [address]   3. Do they need a 30 day or 90 day supply?

## 2019-06-22 ENCOUNTER — Telehealth: Payer: Self-pay | Admitting: Oncology

## 2019-06-22 ENCOUNTER — Telehealth: Payer: Self-pay | Admitting: *Deleted

## 2019-06-22 MED ORDER — PRIMIDONE 50 MG PO TABS: ORAL_TABLET | ORAL | 2 refills | Status: DC

## 2019-06-22 NOTE — Telephone Encounter (Addendum)
Called to request appointment with Dr. Benay Spice as soon as possible. Patient needs refill on his primidone 50 mg bid and he can't get it since Dr. Beryle Beams has retired.  Per Dr. Benay Spice: Refill Primidone and get him in to be seen in next 1-2 months. Notified daughter and refill e-scribed. Scheduling message sent.

## 2019-06-22 NOTE — Telephone Encounter (Signed)
Scheduled appt per 8/07 sch message- pt aware of appt date and time   

## 2019-07-12 ENCOUNTER — Other Ambulatory Visit: Payer: Self-pay | Admitting: Cardiology

## 2019-07-12 DIAGNOSIS — I4891 Unspecified atrial fibrillation: Secondary | ICD-10-CM

## 2019-08-09 NOTE — Progress Notes (Signed)
HPI: Follow-up atrial fibrillation. Had cardiac catheterization in August 2011 that showed nonobstructive coronary disease. Abdominal ultrasound July 2014 showed no aneurysm. Echocardiogram repeated 2/18. LV function normal; mild AS, mild to moderate MR, moderate LAE and mild to moderate AI. Patient had successful cardioversion again on 02/24/2017.Holter monitor December 2018 showed atrial fibrillation with elevated rate. Toprol 25 mg daily added.  PET scan December 2019 showed enlargement of the pulmonary artery, 3 mm right lung nodule 4.1 cm ascending thoracic aorta.  Since last seen,there is some dyspnea on exertion but no orthopnea or PND.  Mild pedal edema.  No chest pain or syncope.  No bleeding.  Current Outpatient Medications  Medication Sig Dispense Refill  . amiodarone (PACERONE) 200 MG tablet TAKE 1 TABLET(200 MG) BY MOUTH DAILY 90 tablet 0  . atorvastatin (LIPITOR) 10 MG tablet TAKE 1 TABLET BY MOUTH EVERY DAY 90 tablet 3  . benzonatate (TESSALON PERLES) 100 MG capsule Take 1 capsule (100 mg total) by mouth 3 (three) times daily as needed for cough. 30 capsule 1  . calcium-vitamin D (OSCAL WITH D) 500-200 MG-UNIT tablet Take 1 tablet by mouth 2 (two) times daily. 60 tablet 6  . ELIQUIS 2.5 MG TABS tablet TAKE 1 TABLET BY MOUTH TWICE DAILY 180 tablet 3  . metoprolol succinate (TOPROL XL) 25 MG 24 hr tablet Take 0.5 tablets (12.5 mg total) by mouth daily. 45 tablet 0  . primidone (MYSOLINE) 50 MG tablet TAKE 1 TABLET BY MOUTH TWICE DAILY( 10:00 AM AND 5:00 PM) 60 tablet 2   No current facility-administered medications for this visit.      Past Medical History:  Diagnosis Date  . Acute bronchitis treated with antibiotics in the past 60 days 05/25/2017   Persistent cough despite two courses of antibiotics 05/25/17  . Anemia   . Aortic insufficiency    Mild, echo, July, 2011  . Aortic stenosis    Mild, echo, July, 2011  . Atrial fibrillation [facility]) 2011, 2015   Multaq  Started September, 2011, dose adjusted October, 2011, 200 mg a.m., 400 mg p.o.  . Bradycardia    Sinus bradycardia while on 25 Lopressor twice a day October, 2013  . Carotid artery disease ([place])    Doppler, 2008 no significant abnormality  . Cervical spine disease   . Chronic anticoagulation 01/27/2015   pradaxa 75 mg BID for A fib  . Coronary artery disease    minimal, cath 2005 / catheterization August, 2011, 30% in 3 vessels, normal LV function  . Diarrhea    Chronic  . Drug therapy    Pradaxa Started July, 2011  . Ejection fraction    EF 60%, echo, 2009  /  TEE normal August, 2011 /   EF 60%, echo, July, 2011  . GERD (gastroesophageal reflux disease)    Esophageal dilatation in 1992, some esophageal spasm  . Hyperlipidemia   . Incomplete right bundle branch block    August, 2012  . Leg fatigue    June, 2014  . Memory change    June, 2014  . Monoclonal gammopathy    granfortuna  . MR (mitral regurgitation) 2009   mild, Echo, July, 2011  . Painless hematuria 03/27/2012   Occurred x 2 5/12 & 5/13 on Pradaxa 75 mg BID  . Prostate cancer ([place]) 11/22/2011  . Renal insufficiency    Prior creatinine 1.2 /  September, 2011.. 1.7  /  October, 2011.. 1.8  . Rib pain on right side  09/25/2018   Suspect path fx 09/23/18  . Right rib fracture 09/25/2018   09/25/18 non traumatic; not obviously pathologic  . S/P transurethral resection of prostate 2004   2004  . TIA (transient ischemic attack)    Dr. Erling Cruz,, question in the past. when off ASA for 10 days    Past Surgical History:  Procedure Laterality Date  . CARDIAC CATHETERIZATION    . CARDIOVERSION  08/01/2012   Procedure: CARDIOVERSION;  Surgeon: Carlena Bjornstad, MD;  Location: [facility] ENDOSCOPY;  Service: Cardiovascular;  Laterality: N/A;  . CARDIOVERSION N/A 11/29/2013   Procedure: CARDIOVERSION;  Surgeon: Carlena Bjornstad, MD;  Location: [facility] ENDOSCOPY;  Service: Cardiovascular;  Laterality: N/A;  . CARDIOVERSION N/A 11/27/2014    Procedure: CARDIOVERSION;  Surgeon: Carlena Bjornstad, MD;  Location: [facility] ENDOSCOPY;  Service: Cardiovascular;  Laterality: N/A;  . CARDIOVERSION N/A 03/12/2015   Procedure: CARDIOVERSION;  Surgeon: Carlena Bjornstad, MD;  Location: [place];  Service: Cardiovascular;  Laterality: N/A;  . CARDIOVERSION N/A 12/12/2015   Procedure: CARDIOVERSION;  Surgeon: Fay Records, MD;  Location: [place];  Service: Cardiovascular;  Laterality: N/A;  . CARDIOVERSION N/A 02/24/2017   Procedure: CARDIOVERSION;  Surgeon: Pixie Casino, MD;  Location: [facility] ENDOSCOPY;  Service: Cardiovascular;  Laterality: N/A;  . CATARACT EXTRACTION    . COLONOSCOPY    . EYE SURGERY Bilateral    cataract extraction  . Fibular fracture repair    . HERNIA REPAIR    . History of TURP    . OPEN REDUCTION INTERNAL FIXATION (ORIF) METACARPAL Left 06/22/2014   Procedure: OPEN REDUCTION INTERNAL FIXATION (ORIF) LEFT HAND METACARPAL;  Surgeon: Roseanne Kaufman, MD;  Location: [place];  Service: Orthopedics;  Laterality: Left;  . ORIF ORBITAL FRACTURE Bilateral 06/22/2014   Procedure: OPEN REDUCTION INTERNAL FIXATION (ORIF) BILATERAL LEFORTE1 FRACTURE OF MAXILLA, HYBRID ARCH BARS ;  Surgeon: Izora Gala, MD;  Location: [place];  Service: ENT;  Laterality: Bilateral;  . SHOULDER ARTHROSCOPY WITH ROTATOR CUFF REPAIR Right 01/23/2014   DR Noemi Chapel   . SHOULDER ARTHROSCOPY WITH ROTATOR CUFF REPAIR Right 01/23/2014   Procedure: RIGHT SHOULDER ARTHROSCOPY WITH DEBRIDEMENT AND ROTATOR CUFF REPAIR;  Surgeon: Lorn Junes, MD;  Location: [place];  Service: Orthopedics;  Laterality: Right;    Social History   Socioeconomic History  . Marital status: Widowed    Spouse name: Not on file  . Number of children: Not on file  . Years of education: Not on file  . Highest education level: Not on file  Occupational History  . Occupation: retired    Comment: physician  Social Needs  . Financial resource strain: Not on file  . Food insecurity    Worry: Not  on file    Inability: Not on file  . Transportation needs    Medical: Not on file    Non-medical: Not on file  Tobacco Use  . Smoking status: Former Research scientist (life sciences)  . Smokeless tobacco: Never Used  Substance and Sexual Activity  . Alcohol use: Yes    Alcohol/week: 2.0 standard drinks    Types: 1 Glasses of wine, 1 Shots of liquor per week    Comment: Evenings.  . Drug use: No  . Sexual activity: Not Currently  Lifestyle  . Physical activity    Days per week: Not on file    Minutes per session: Not on file  . Stress: Not on file  Relationships  . Social Herbalist on phone: Not  on file    Gets together: Not on file    Attends religious service: Not on file    Active member of club or organization: Not on file    Attends meetings of clubs or organizations: Not on file    Relationship status: Not on file  . Intimate partner violence    Fear of current or ex partner: Not on file    Emotionally abused: Not on file    Physically abused: Not on file    Forced sexual activity: Not on file  Other Topics Concern  . Not on file  Social History Narrative  . Not on file    History reviewed. No pertinent family history.  ROS: no fevers or chills, productive cough, hemoptysis, dysphasia, odynophagia, melena, hematochezia, dysuria, hematuria, rash, seizure activity, orthopnea, PND, claudication. Remaining systems are negative.  Physical Exam: Well-developed frail in no acute distress.  Skin is warm and dry.  HEENT is normal.  Neck is supple.  Chest is clear to auscultation with normal expansion.  Cardiovascular exam is regular rate and rhythm.  Abdominal exam nontender or distended. No masses palpated. Extremities show trace edema. neuro grossly intact  A/P  1 permanent atrial fibrillation-plan to continue amiodarone and Toprol for rate control.  Patient did not tolerate higher doses of Toprol previously and we have therefore continued amiodarone to help with rate control.   Continue apixaban at present dose.  We will arrange for CBC, Bmet, TSH and liver functions to be drawn at his next oncology appointment.  2 mild aortic stenosis/mild to moderate aortic insufficiency-we are planning to be conservative given patient's age.  He states he would not consider TAVR in the future and we will therefore not plan repeat echocardiograms.  3 TAA-4.1 on most recent scan.  I have elected not to pursue further imaging as given his age and overall medical condition he would not be a candidate for replacement.  4 hyperlipidemia-continue statin.  5 nonobstructive coronary disease-continue statin.  No aspirin given need for anticoagulation.  Kirk Ruths, MD

## 2019-08-10 ENCOUNTER — Telehealth: Payer: Self-pay | Admitting: Cardiology

## 2019-08-10 NOTE — Telephone Encounter (Signed)
New Message   Patient needs assistance and daughter wants to come to appointment with him.  Please call to discuss.

## 2019-08-10 NOTE — Telephone Encounter (Signed)
Spoke with pt daughter, her understanding was she was not allowed in the building. Explained to the daughter she can bring him in the building and to the door of the office and then she will need to step away and wait in the car. She voiced understanding and agreed.

## 2019-08-13 ENCOUNTER — Other Ambulatory Visit: Payer: Self-pay

## 2019-08-13 ENCOUNTER — Ambulatory Visit (INDEPENDENT_AMBULATORY_CARE_PROVIDER_SITE_OTHER): Payer: Medicare Other | Admitting: Cardiology

## 2019-08-13 ENCOUNTER — Encounter: Payer: Self-pay | Admitting: Cardiology

## 2019-08-13 VITALS — BP 134/66 | HR 77 | Temp 97.7°F | Ht 67.0 in | Wt 193.0 lb

## 2019-08-13 DIAGNOSIS — I350 Nonrheumatic aortic (valve) stenosis: Secondary | ICD-10-CM | POA: Diagnosis not present

## 2019-08-13 DIAGNOSIS — I2510 Atherosclerotic heart disease of native coronary artery without angina pectoris: Secondary | ICD-10-CM | POA: Diagnosis not present

## 2019-08-13 DIAGNOSIS — I4821 Permanent atrial fibrillation: Secondary | ICD-10-CM

## 2019-08-13 NOTE — Patient Instructions (Signed)
Medication Instructions:  NO CHANGE If you need a refill on your cardiac medications before your next appointment, please call your pharmacy.   Lab work: Your physician recommends that you return for lab work WITH ONCOLOGY= CBC/BMP/HEPATIC/TSH  If you have labs (blood work) drawn today and your tests are completely normal, you will receive your results only by: Marland Kitchen MyChart Message (if you have MyChart) OR . A paper copy in the mail If you have any lab test that is abnormal or we need to change your treatment, we will call you to review the results.  Follow-Up: At [facility], you and your health needs are our priority.  As part of our continuing mission to provide you with exceptional heart care, we have created designated Provider Care Teams.  These Care Teams include your primary Cardiologist (physician) and Advanced Practice Providers (APPs -  Physician Assistants and Nurse Practitioners) who all work together to provide you with the care you need, when you need it. You will need a follow up appointment in 6 months.  Please call our office 2 months in advance to schedule this appointment.  You may see Kirk Ruths, MD or one of the following Advanced Practice Providers on your designated Care Team:   Kerin Ransom, PA-C Roby Lofts, [place] . Sande Rives, PA-C

## 2019-08-20 ENCOUNTER — Other Ambulatory Visit: Payer: Self-pay

## 2019-08-20 ENCOUNTER — Other Ambulatory Visit: Payer: Self-pay | Admitting: *Deleted

## 2019-08-20 DIAGNOSIS — C61 Malignant neoplasm of prostate: Secondary | ICD-10-CM

## 2019-08-20 DIAGNOSIS — D472 Monoclonal gammopathy: Secondary | ICD-10-CM

## 2019-08-20 DIAGNOSIS — R531 Weakness: Secondary | ICD-10-CM

## 2019-08-20 MED ORDER — METOPROLOL SUCCINATE ER 25 MG PO TB24: 12.5000 mg | ORAL_TABLET | Freq: Every day | ORAL | 1 refills | Status: DC

## 2019-08-21 ENCOUNTER — Inpatient Hospital Stay: Payer: Medicare Other | Attending: Oncology

## 2019-08-21 ENCOUNTER — Other Ambulatory Visit: Payer: Self-pay

## 2019-08-21 ENCOUNTER — Inpatient Hospital Stay: Payer: Medicare Other | Admitting: Oncology

## 2019-08-21 VITALS — BP 156/98 | HR 98 | Temp 98.3°F | Resp 17 | Ht 67.0 in | Wt 193.3 lb

## 2019-08-21 DIAGNOSIS — I351 Nonrheumatic aortic (valve) insufficiency: Secondary | ICD-10-CM

## 2019-08-21 DIAGNOSIS — I4891 Unspecified atrial fibrillation: Secondary | ICD-10-CM | POA: Diagnosis not present

## 2019-08-21 DIAGNOSIS — D472 Monoclonal gammopathy: Secondary | ICD-10-CM | POA: Diagnosis not present

## 2019-08-21 DIAGNOSIS — R531 Weakness: Secondary | ICD-10-CM

## 2019-08-21 DIAGNOSIS — D539 Nutritional anemia, unspecified: Secondary | ICD-10-CM | POA: Insufficient documentation

## 2019-08-21 DIAGNOSIS — Z8546 Personal history of malignant neoplasm of prostate: Secondary | ICD-10-CM | POA: Insufficient documentation

## 2019-08-21 DIAGNOSIS — I2510 Atherosclerotic heart disease of native coronary artery without angina pectoris: Secondary | ICD-10-CM

## 2019-08-21 DIAGNOSIS — Z23 Encounter for immunization: Secondary | ICD-10-CM | POA: Diagnosis not present

## 2019-08-21 DIAGNOSIS — I350 Nonrheumatic aortic (valve) stenosis: Secondary | ICD-10-CM | POA: Diagnosis not present

## 2019-08-21 DIAGNOSIS — C61 Malignant neoplasm of prostate: Secondary | ICD-10-CM

## 2019-08-21 LAB — CBC WITH DIFFERENTIAL (CANCER CENTER ONLY)
Abs Immature Granulocytes: 0.02 10*3/uL (ref 0.00–0.07)
Basophils Absolute: 0 10*3/uL (ref 0.0–0.1)
Basophils Relative: 1 %
Eosinophils Absolute: 0.5 10*3/uL (ref 0.0–0.5)
Eosinophils Relative: 7 %
HCT: 35.3 % — ABNORMAL LOW (ref 39.0–52.0)
Hemoglobin: 11.7 g/dL — ABNORMAL LOW (ref 13.0–17.0)
Immature Granulocytes: 0 %
Lymphocytes Relative: 30 %
Lymphs Abs: 2.2 10*3/uL (ref 0.7–4.0)
MCH: 35.2 pg — ABNORMAL HIGH (ref 26.0–34.0)
MCHC: 33.1 g/dL (ref 30.0–36.0)
MCV: 106.3 fL — ABNORMAL HIGH (ref 80.0–100.0)
Monocytes Absolute: 0.6 10*3/uL (ref 0.1–1.0)
Monocytes Relative: 8 %
Neutro Abs: 4 10*3/uL (ref 1.7–7.7)
Neutrophils Relative %: 54 %
Platelet Count: 197 10*3/uL (ref 150–400)
RBC: 3.32 MIL/uL — ABNORMAL LOW (ref 4.22–5.81)
RDW: 13.2 % (ref 11.5–15.5)
WBC Count: 7.3 10*3/uL (ref 4.0–10.5)
nRBC: 0 % (ref 0.0–0.2)

## 2019-08-21 LAB — CMP (CANCER CENTER ONLY)
ALT: 27 U/L (ref 0–44)
AST: 24 U/L (ref 15–41)
Albumin: 3.4 g/dL — ABNORMAL LOW (ref 3.5–5.0)
Alkaline Phosphatase: 61 U/L (ref 38–126)
Anion gap: 6 (ref 5–15)
BUN: 18 mg/dL (ref 8–23)
CO2: 23 mmol/L (ref 22–32)
Calcium: 9 mg/dL (ref 8.9–10.3)
Chloride: 107 mmol/L (ref 98–111)
Creatinine: 1.61 mg/dL — ABNORMAL HIGH (ref 0.61–1.24)
GFR, Est AFR Am: 42 mL/min — ABNORMAL LOW (ref >60)
GFR, Estimated: 37 mL/min — ABNORMAL LOW (ref >60)
Glucose, Bld: 98 mg/dL (ref 70–99)
Potassium: 4.4 mmol/L (ref 3.5–5.1)
Sodium: 136 mmol/L (ref 135–145)
Total Bilirubin: 0.4 mg/dL (ref 0.3–1.2)
Total Protein: 7.8 g/dL (ref 6.5–8.1)

## 2019-08-21 LAB — TSH: TSH: 1.852 u[IU]/mL (ref 0.320–4.118)

## 2019-08-21 MED ORDER — INFLUENZA VAC A&B SA ADJ QUAD 0.5 ML IM PRSY
PREFILLED_SYRINGE | INTRAMUSCULAR | Status: AC
  Filled 2019-08-21: qty 0.5

## 2019-08-21 MED ORDER — INFLUENZA VAC A&B SA ADJ QUAD 0.5 ML IM PRSY
0.5000 mL | PREFILLED_SYRINGE | Freq: Once | INTRAMUSCULAR | Status: AC
  Administered 2019-08-21: 15:00:00 0.5 mL via INTRAMUSCULAR

## 2019-08-21 NOTE — Progress Notes (Signed)
[facility] Patient Consult   Requesting MD: Annia Belt, Md No address on file   [name] [name] 85 y.o.  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 Beryle Beams for an IgG kappa monoclonal gammopathy initially diagnosed in July 2000.  There is been a slow rise in the IgG protein, but he has not developed symptoms of overt myeloma.  Dr. Beryle Beams was concerned progression to smoldering myeloma September 2019.  Dr. Lynann Bologna declined a bone marrow biopsy.  He had a fall and developed a fracture of the right seventh rib in November 2019.  A PET scan revealed no suspicion for myeloma.  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  Past Medical History:  Diagnosis Date  . Acute bronchitis treated with antibiotics in the past 60 days 05/25/2017   Persistent cough despite two courses of antibiotics 05/25/17  . Anemia   . Aortic insufficiency    Mild, echo, July, 2011  . Aortic stenosis    Mild, echo, July, 2011  . Atrial fibrillation [facility]) 2011, 2015   Multaq Started September, 2011, dose adjusted October, 2011, 200 mg a.m., 400 mg p.o.  . Bradycardia    Sinus bradycardia while on 25 Lopressor twice a day October, 2013  . Carotid artery disease ([place])    Doppler, 2008 no significant abnormality  . Cervical spine disease   . Chronic anticoagulation 01/27/2015   pradaxa 75 mg BID for A fib  . Coronary artery disease    minimal, cath 2005 / catheterization August, 2011, 30% in 3 vessels, normal LV function  . Diarrhea    Chronic  . Drug therapy    Pradaxa Started July, 2011  . Ejection fraction    EF 60%, echo, 2009  /  TEE normal August, 2011 /   EF 60%, echo, July, 2011  . GERD (gastroesophageal reflux disease)    Esophageal dilatation in 1992, some esophageal spasm  . Hyperlipidemia   . Incomplete right bundle branch block    August, 2012  . Leg fatigue    June, 2014  . Memory change    June, 2014  .  Monoclonal gammopathy    granfortuna  . MR (mitral regurgitation) 2009   mild, Echo, July, 2011  . Painless hematuria 03/27/2012   Occurred x 2 5/12 & 5/13 on Pradaxa 75 mg BID  . Prostate cancer ([place]) 11/22/2011  . Renal insufficiency    Prior creatinine 1.2 /  September, 2011.. 1.7  /  October, 2011.. 1.8  . Rib pain on right side 09/25/2018   Suspect path fx 09/23/18  . Right rib fracture 09/25/2018   09/25/18 non traumatic; not obviously pathologic  . S/P transurethral resection of prostate 2004   2004  . TIA (transient ischemic attack)    Dr. Erling Cruz,, question in the past. when off ASA for 10 days    Past Surgical History:  Procedure Laterality Date  . CARDIAC CATHETERIZATION    . CARDIOVERSION  08/01/2012   Procedure: CARDIOVERSION;  Surgeon: Carlena Bjornstad, MD;  Location: [facility] ENDOSCOPY;  Service: Cardiovascular;  Laterality: N/A;  . CARDIOVERSION N/A 11/29/2013   Procedure: CARDIOVERSION;  Surgeon: Carlena Bjornstad, MD;  Location: [facility] ENDOSCOPY;  Service: Cardiovascular;  Laterality: N/A;  . CARDIOVERSION N/A 11/27/2014   Procedure: CARDIOVERSION;  Surgeon: Carlena Bjornstad, MD;  Location: [place];  Service: Cardiovascular;  Laterality: N/A;  . CARDIOVERSION N/A 03/12/2015   Procedure:  CARDIOVERSION;  Surgeon: Carlena Bjornstad, MD;  Location: [facility] ENDOSCOPY;  Service: Cardiovascular;  Laterality: N/A;  . CARDIOVERSION N/A 12/12/2015   Procedure: CARDIOVERSION;  Surgeon: Fay Records, MD;  Location: [place];  Service: Cardiovascular;  Laterality: N/A;  . CARDIOVERSION N/A 02/24/2017   Procedure: CARDIOVERSION;  Surgeon: Pixie Casino, MD;  Location: [place];  Service: Cardiovascular;  Laterality: N/A;  . CATARACT EXTRACTION    . COLONOSCOPY    . EYE SURGERY Bilateral    cataract extraction  . Fibular fracture repair    . HERNIA REPAIR    . History of TURP    . OPEN REDUCTION INTERNAL FIXATION (ORIF) METACARPAL Left 06/22/2014   Procedure: OPEN REDUCTION INTERNAL FIXATION  (ORIF) LEFT HAND METACARPAL;  Surgeon: Roseanne Kaufman, MD;  Location: [place];  Service: Orthopedics;  Laterality: Left;  . ORIF ORBITAL FRACTURE Bilateral 06/22/2014   Procedure: OPEN REDUCTION INTERNAL FIXATION (ORIF) BILATERAL LEFORTE1 FRACTURE OF MAXILLA, HYBRID ARCH BARS ;  Surgeon: Izora Gala, MD;  Location: [place];  Service: ENT;  Laterality: Bilateral;  . SHOULDER ARTHROSCOPY WITH ROTATOR CUFF REPAIR Right 01/23/2014   DR Noemi Chapel   . SHOULDER ARTHROSCOPY WITH ROTATOR CUFF REPAIR Right 01/23/2014   Procedure: RIGHT SHOULDER ARTHROSCOPY WITH DEBRIDEMENT AND ROTATOR CUFF REPAIR;  Surgeon: Lorn Junes, MD;  Location: [place];  Service: Orthopedics;  Laterality: Right;    Medications: Reviewed  Allergies:  Allergies  Allergen Reactions  . Sulfa Antibiotics Swelling  . Chlorhexidine Rash  . Guaifenesin & Derivatives Itching  . Keflex [Cephalexin] Diarrhea  . Oxycodone Itching and Rash  . Oxycontin [Oxycodone Hcl] Itching and Rash  . Penicillins Other (See Comments)    Caused fungal reaction Has patient had a PCN reaction causing immediate rash, facial/tongue/throat swelling, SOB or lightheadedness with hypotension: No Has patient had a PCN reaction causing severe rash involving mucus membranes or skin necrosis: No Has patient had a PCN reaction that required hospitalization No Has patient had a PCN reaction occurring within the last 10 years: No If all of the above answers are "NO", then may proceed with Cephalosporin use.     Family history: No family history of cancer  Social History:   He is a retired Engineer, civil (consulting).  He lives alone and has 24-hour care in the home.  He is not use cigarettes.  Call occasionally.  No transfusion history.  ROS:   Positives include: Constipation, gait instability-uses a walker  A complete ROS was otherwise negative.  Physical Exam:  Blood pressure (!) 156/98, pulse 98, temperature 98.3 F (36.8 C), temperature source Temporal, resp. rate 17,  height '5\' 7"'  (1.702 m), weight 193 lb 4.8 oz (87.7 kg), SpO2 100 %.  HEENT: Neck without mass Lungs: Clear bilaterally Cardiac: Irregular, 2/6 systolic murmur Abdomen: No hepatosplenomegaly  Vascular: Trace edema at the right lower leg Lymph nodes: No cervical, supraclavicular, right axillary, or inguinal nodes, soft mobile-1.5 cm left axillary node versus a prominent fat pad Neurologic: Alert and oriented Skin: Multiple scars and keratoses Musculoskeletal: Spine tenderness   LAB:  CBC  Lab Results  Component Value Date   WBC 7.3 08/21/2019   HGB 11.7 (L) 08/21/2019   HCT 35.3 (L) 08/21/2019   MCV 106.3 (H) 08/21/2019   PLT 197 08/21/2019   NEUTROABS 4.0 08/21/2019        CMP  Lab Results  Component Value Date   NA 136 08/21/2019   K 4.4 08/21/2019   CL 107 08/21/2019  CO2 23 08/21/2019   GLUCOSE 98 08/21/2019   BUN 18 08/21/2019   CREATININE 1.61 (H) 08/21/2019   CALCIUM 9.0 08/21/2019   PROT 7.8 08/21/2019   ALBUMIN 3.4 (L) 08/21/2019   AST 24 08/21/2019   ALT 27 08/21/2019   ALKPHOS 61 08/21/2019   BILITOT 0.4 08/21/2019   GFRNONAA 37 (L) 08/21/2019   GFRAA 42 (L) 08/21/2019       Assessment/Plan:   1. IgG kappa serum monoclonal protein  2. Mild macrocytic anemia- chronic 3. Renal insufficiency 4. Atrial fibrillation 5. History of prostate cancer- treated with radiofrequency ablation in February 2000 6. Aortic stenosis/aortic insufficiency 7. 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  He has chronic mild anemia.  There is no clinical or laboratory evidence for progression to overt myeloma.  It is possible he has smoldering myeloma, but he does not appear symptomatic and a PET scan in December of last year revealed no evidence of hypermetabolic marrow or bone lesions.  We will follow-up on the serum free light chains and IgG levels from today.  He will return for an  office and lab visit in 6 months.  We will schedule a metastatic bone survey for within the next few months.  [place] received an influenza vaccine today.  Betsy Coder, MD  08/21/2019, 5:29 PM

## 2019-08-22 ENCOUNTER — Telehealth: Payer: Self-pay | Admitting: Oncology

## 2019-08-22 LAB — PROSTATE-SPECIFIC AG, SERUM (LABCORP): Prostate Specific Ag, Serum: 2.5 ng/mL (ref 0.0–4.0)

## 2019-08-22 LAB — IMMUNOFIXATION ELECTROPHORESIS
IgA: 69 mg/dL (ref 61–437)
IgG (Immunoglobin G), Serum: 3232 mg/dL — ABNORMAL HIGH (ref 603–1613)
IgM (Immunoglobulin M), Srm: 65 mg/dL (ref 15–143)
Total Protein ELP: 7.7 g/dL (ref 6.0–8.5)

## 2019-08-22 LAB — KAPPA/LAMBDA LIGHT CHAINS
Kappa free light chain: 99.7 mg/L — ABNORMAL HIGH (ref 3.3–19.4)
Kappa, lambda light chain ratio: 9.5 — ABNORMAL HIGH (ref 0.26–1.65)
Lambda free light chains: 10.5 mg/L (ref 5.7–26.3)

## 2019-08-22 NOTE — Telephone Encounter (Signed)
Called and left msg. Mailed printout  °

## 2019-08-23 ENCOUNTER — Telehealth: Payer: Self-pay | Admitting: *Deleted

## 2019-08-23 NOTE — Telephone Encounter (Signed)
-----   Message from Ladell Pier, MD sent at 08/21/2019  5:43 PM EDT ----- Please call patient, I ordered a bone survey at [facility] for within the next month, last bone survey was in September 2019

## 2019-08-23 NOTE — Telephone Encounter (Signed)
Notified patient that Dr. Benay Spice suggest a repeat bone survey in the next month and he will be called with appointment. He expressed he is not happy, but will do as MD suggests. He confirms as well that he has reviewed his labs and is aware of light chains and new reference range with these.

## 2019-09-04 ENCOUNTER — Ambulatory Visit (HOSPITAL_COMMUNITY)
Admission: RE | Admit: 2019-09-04 | Discharge: 2019-09-04 | Disposition: A | Discharge status: Home / Self Care | Payer: Medicare Other | Source: Ambulatory Visit | Attending: Oncology | Admitting: Oncology

## 2019-09-04 ENCOUNTER — Other Ambulatory Visit: Payer: Self-pay

## 2019-09-04 DIAGNOSIS — D472 Monoclonal gammopathy: Secondary | ICD-10-CM

## 2019-09-11 ENCOUNTER — Other Ambulatory Visit: Payer: Self-pay | Admitting: Internal Medicine

## 2019-09-11 NOTE — Telephone Encounter (Signed)
On for a fib. Saw Crenshaw in Sept. He can ask him for more refills in future

## 2019-09-12 ENCOUNTER — Other Ambulatory Visit: Payer: Self-pay | Admitting: Oncology

## 2019-09-24 ENCOUNTER — Other Ambulatory Visit: Payer: Self-pay | Admitting: Oncology

## 2019-09-25 ENCOUNTER — Other Ambulatory Visit: Payer: Self-pay | Admitting: *Deleted

## 2019-10-15 ENCOUNTER — Other Ambulatory Visit: Payer: Self-pay

## 2019-10-15 DIAGNOSIS — I4891 Unspecified atrial fibrillation: Secondary | ICD-10-CM

## 2019-10-15 MED ORDER — AMIODARONE HCL 200 MG PO TABS: ORAL_TABLET | ORAL | 1 refills | Status: DC

## 2019-11-26 ENCOUNTER — Other Ambulatory Visit: Payer: Self-pay | Admitting: *Deleted

## 2019-11-26 MED ORDER — PRIMIDONE 50 MG PO TABS: ORAL_TABLET | ORAL | 2 refills | Status: DC

## 2019-11-29 ENCOUNTER — Other Ambulatory Visit: Payer: Self-pay

## 2019-11-29 ENCOUNTER — Ambulatory Visit: Payer: Medicare PPO | Admitting: Podiatry

## 2019-11-29 ENCOUNTER — Encounter: Payer: Self-pay | Admitting: Podiatry

## 2019-11-29 DIAGNOSIS — L03031 Cellulitis of right toe: Secondary | ICD-10-CM

## 2019-11-29 NOTE — Progress Notes (Signed)
Subjective:  Patient ID: [name] [name], male    DOB: 1924-11-30,  MRN: [id number] HPI Chief Complaint  Patient presents with  . Toe Pain    Hallux right - medial border x few weeks, redness, using neosporin  . New Patient (Initial Visit)    86 y.o. male presents with the above complaint.   ROS: Denies fever chills nausea vomiting muscle aches pains calf pain back pain chest pain shortness of breath.  Past Medical History:  Diagnosis Date  . Acute bronchitis treated with antibiotics in the past 60 days 05/25/2017   Persistent cough despite two courses of antibiotics 05/25/17  . Anemia   . Aortic insufficiency    Mild, echo, July, 2011  . Aortic stenosis    Mild, echo, July, 2011  . Atrial fibrillation [facility]) 2011, 2015   Multaq Started September, 2011, dose adjusted October, 2011, 200 mg a.m., 400 mg p.o.  . Bradycardia    Sinus bradycardia while on 25 Lopressor twice a day October, 2013  . Carotid artery disease ([facility])    Doppler, 2008 no significant abnormality  . Cervical spine disease   . Chronic anticoagulation 01/27/2015   pradaxa 75 mg BID for A fib  . Coronary artery disease    minimal, cath 2005 / catheterization August, 2011, 30% in 3 vessels, normal LV function  . Diarrhea    Chronic  . Drug therapy    Pradaxa Started July, 2011  . Ejection fraction    EF 60%, echo, 2009  /  TEE normal August, 2011 /   EF 60%, echo, July, 2011  . GERD (gastroesophageal reflux disease)    Esophageal dilatation in 1992, some esophageal spasm  . Hyperlipidemia   . Incomplete right bundle branch block    August, 2012  . Leg fatigue    June, 2014  . Memory change    June, 2014  . Monoclonal gammopathy    granfortuna  . MR (mitral regurgitation) 2009   mild, Echo, July, 2011  . Painless hematuria 03/27/2012   Occurred x 2 5/12 & 5/13 on Pradaxa 75 mg BID  . Prostate cancer ([facility]) 11/22/2011  . Renal insufficiency    Prior creatinine 1.2 /  September, 2011.. 1.7  /  October,  2011.. 1.8  . Rib pain on right side 09/25/2018   Suspect path fx 09/23/18  . Right rib fracture 09/25/2018   09/25/18 non traumatic; not obviously pathologic  . S/P transurethral resection of prostate 2004   2004  . TIA (transient ischemic attack)    Dr. Sandria Manly,, question in the past. when off ASA for 10 days   Past Surgical History:  Procedure Laterality Date  . CARDIAC CATHETERIZATION    . CARDIOVERSION  08/01/2012   Procedure: CARDIOVERSION;  Surgeon: Luis Abed, MD;  Location: [facility] ENDOSCOPY;  Service: Cardiovascular;  Laterality: N/A;  . CARDIOVERSION N/A 11/29/2013   Procedure: CARDIOVERSION;  Surgeon: Luis Abed, MD;  Location: [facility] ENDOSCOPY;  Service: Cardiovascular;  Laterality: N/A;  . CARDIOVERSION N/A 11/27/2014   Procedure: CARDIOVERSION;  Surgeon: Luis Abed, MD;  Location: [facility] ENDOSCOPY;  Service: Cardiovascular;  Laterality: N/A;  . CARDIOVERSION N/A 03/12/2015   Procedure: CARDIOVERSION;  Surgeon: Luis Abed, MD;  Location: [facility] ENDOSCOPY;  Service: Cardiovascular;  Laterality: N/A;  . CARDIOVERSION N/A 12/12/2015   Procedure: CARDIOVERSION;  Surgeon: Pricilla Riffle, MD;  Location: [facility] ENDOSCOPY;  Service: Cardiovascular;  Laterality: N/A;  . CARDIOVERSION N/A 02/24/2017   Procedure: CARDIOVERSION;  Surgeon: Chrystie Nose, MD;  Location: [facility] ENDOSCOPY;  Service: Cardiovascular;  Laterality: N/A;  . CATARACT EXTRACTION    . COLONOSCOPY    . EYE SURGERY Bilateral    cataract extraction  . Fibular fracture repair    . HERNIA REPAIR    . History of TURP    . OPEN REDUCTION INTERNAL FIXATION (ORIF) METACARPAL Left 06/22/2014   Procedure: OPEN REDUCTION INTERNAL FIXATION (ORIF) LEFT HAND METACARPAL;  Surgeon: Dominica Severin, MD;  Location: [facility] OR;  Service: Orthopedics;  Laterality: Left;  . ORIF ORBITAL FRACTURE Bilateral 06/22/2014   Procedure: OPEN REDUCTION INTERNAL FIXATION (ORIF) BILATERAL LEFORTE1 FRACTURE OF MAXILLA, HYBRID ARCH BARS ;  Surgeon: Serena Colonel, MD;   Location: [facility] OR;  Service: ENT;  Laterality: Bilateral;  . SHOULDER ARTHROSCOPY WITH ROTATOR CUFF REPAIR Right 01/23/2014   DR Thurston Hole   . SHOULDER ARTHROSCOPY WITH ROTATOR CUFF REPAIR Right 01/23/2014   Procedure: RIGHT SHOULDER ARTHROSCOPY WITH DEBRIDEMENT AND ROTATOR CUFF REPAIR;  Surgeon: Nilda Simmer, MD;  Location: [facility] OR;  Service: Orthopedics;  Laterality: Right;    Current Outpatient Medications:  .  amiodarone (PACERONE) 200 MG tablet, TAKE 1 TABLET(200 MG) BY MOUTH DAILY, Disp: 90 tablet, Rfl: 1 .  atorvastatin (LIPITOR) 10 MG tablet, TAKE 1 TABLET BY MOUTH EVERY DAY, Disp: 90 tablet, Rfl: 3 .  benzonatate (TESSALON PERLES) 100 MG capsule, Take 1 capsule (100 mg total) by mouth 3 (three) times daily as needed for cough. (Patient not taking: Reported on 08/21/2019), Disp: 30 capsule, Rfl: 1 .  calcium-vitamin D (OSCAL WITH D) 500-200 MG-UNIT tablet, Take 1 tablet by mouth 2 (two) times daily., Disp: 60 tablet, Rfl: 6 .  ELIQUIS 2.5 MG TABS tablet, TAKE 1 TABLET BY MOUTH TWICE DAILY, Disp: 180 tablet, Rfl: 1 .  metoprolol succinate (TOPROL XL) 25 MG 24 hr tablet, Take 0.5 tablets (12.5 mg total) by mouth daily., Disp: 45 tablet, Rfl: 1 .  primidone (MYSOLINE) 50 MG tablet, TAKE 1 TABLET BY MOUTH TWICE DAILY 10 AM AND 5 PM., Disp: 60 tablet, Rfl: 2 .  senna (SENOKOT) 8.6 MG tablet, Take 1 tablet by mouth daily as needed for constipation., Disp: , Rfl:   Allergies  Allergen Reactions  . Sulfa Antibiotics Swelling  . Chlorhexidine Rash  . Guaifenesin & Derivatives Itching  . Keflex [Cephalexin] Diarrhea  . Oxycodone Itching and Rash  . Oxycontin [Oxycodone Hcl] Itching and Rash  . Penicillins Other (See Comments)    Caused fungal reaction Has patient had a PCN reaction causing immediate rash, facial/tongue/throat swelling, SOB or lightheadedness with hypotension: No Has patient had a PCN reaction causing severe rash involving mucus membranes or skin necrosis: No Has patient had a PCN  reaction that required hospitalization No Has patient had a PCN reaction occurring within the last 10 years: No If all of the above answers are "NO", then may proceed with Cephalosporin use.    Review of Systems Objective:  There were no vitals filed for this visit.  General: Well developed, nourished, in no acute distress, alert and oriented x3   Dermatological: Skin is warm, dry and supple bilateral. Nails x 10 are well maintained; remaining integument appears unremarkable at this time. There are no open sores, no preulcerative lesions, no rash or signs of infection present.  Ingrown toenail tibial border hallux right mild erythema tenderness and granulation tissue present no purulence no malodor.  Vascular: Dorsalis Pedis artery and Posterior Tibial artery pedal pulses are 2/4 bilateral with  immedate capillary fill time. Pedal hair growth present. No varicosities and no lower extremity edema present bilateral.   Neruologic: Grossly intact via light touch bilateral. Vibratory intact via tuning fork bilateral. Protective threshold with Semmes Wienstein monofilament intact to all pedal sites bilateral. Patellar and Achilles deep tendon reflexes 2+ bilateral. No Babinski or clonus noted bilateral.   Musculoskeletal: No gross boney pedal deformities bilateral. No pain, crepitus, or limitation noted with foot and ankle range of motion bilateral. Muscular strength 5/5 in all groups tested bilateral.  Gait: Unassisted, Nonantalgic.    Radiographs:  None taken  Assessment & Plan:   Assessment: Paronychia ingrown nail tibial border hallux right  Plan: An incision and drainage was performed today after local anesthetic was administered to the hallux right.  Tolerated procedure well without complications.  Placed Surgicel to prevent bleeding.  Provided him with both oral and written home-going instructions for the care and soaking of the toe as well as discussing it with his assistant.  He  understands this is amenable to it and will follow up with me in 2 weeks     Mikiah Durall T. [place], [place]

## 2019-11-29 NOTE — Patient Instructions (Signed)

## 2019-12-18 ENCOUNTER — Ambulatory Visit: Payer: Medicare PPO | Admitting: Podiatry

## 2019-12-18 ENCOUNTER — Other Ambulatory Visit: Payer: Self-pay

## 2019-12-18 DIAGNOSIS — L03031 Cellulitis of right toe: Secondary | ICD-10-CM

## 2019-12-18 DIAGNOSIS — M2041 Other hammer toe(s) (acquired), right foot: Secondary | ICD-10-CM

## 2019-12-18 NOTE — Progress Notes (Signed)
He presents today 2 weeks status post paronychia hallux right.  Has been soaking until last week states that is doing well concerned about going back.  Objective: Vital signs are stable alert and oriented x3.  There is no erythema edema cellulitis drainage or odor.  Hammertoe deformities painful and bad at night #2 #3 and #4 of the right foot.  They appear to be arthritic and rigid and fixed.  Assessment: Hammertoe deformity #2 #3 #4 the right foot well-healing nail avulsion tibial border hallux right.  Plan: Encouraged him to follow-up with me on an as-needed basis and also to use Voltaren gel as an anti-inflammatory for his painful toes at night.

## 2019-12-25 ENCOUNTER — Telehealth: Payer: Self-pay | Admitting: *Deleted

## 2019-12-25 NOTE — Telephone Encounter (Signed)
Patient inquired as to when he can receive the COVID vaccine? Thought it was being given at [facility]. He is already on the waiting list. Informed him that he will be contacted by email or Mychart with appointment and it will be done at the [facility]. His care giver may drive him there, but she will not be given the vaccine (only 86 years old).

## 2020-01-04 ENCOUNTER — Ambulatory Visit: Payer: Medicare Other

## 2020-01-06 ENCOUNTER — Ambulatory Visit: Payer: Medicare PPO | Attending: Internal Medicine

## 2020-01-06 DIAGNOSIS — Z23 Encounter for immunization: Secondary | ICD-10-CM | POA: Insufficient documentation

## 2020-01-06 NOTE — Progress Notes (Signed)
   Covid-19 Vaccination Clinic  Name:  [name] [name]    MRN: [id number] DOB: 09/24/1925  01/06/2020  Mr. [name] [name] was observed post Covid-19 immunization for 15 minutes without incidence. He was provided with Vaccine Information Sheet and instruction to access the V-Safe system.   Mr. [name] [name] was instructed to call 911 with any severe reactions post vaccine: Marland Kitchen Difficulty breathing  . Swelling of your face and throat  . A fast heartbeat  . A bad rash all over your body  . Dizziness and weakness    Immunizations Administered    Name Date Dose VIS Date Route   Pfizer COVID-19 Vaccine 01/06/2020  8:53 AM 0.3 mL 10/26/2019 Intramuscular   Manufacturer: [place]   Lot: [id number]   [place]: [id number]

## 2020-01-14 ENCOUNTER — Other Ambulatory Visit: Payer: Self-pay

## 2020-01-14 MED ORDER — METOPROLOL SUCCINATE ER 25 MG PO TB24: 12.5000 mg | ORAL_TABLET | Freq: Every day | ORAL | 1 refills | Status: DC

## 2020-01-15 ENCOUNTER — Other Ambulatory Visit: Payer: Self-pay

## 2020-01-30 ENCOUNTER — Ambulatory Visit: Payer: Medicare PPO | Attending: Internal Medicine

## 2020-01-30 DIAGNOSIS — Z23 Encounter for immunization: Secondary | ICD-10-CM

## 2020-01-30 NOTE — Progress Notes (Signed)
   Covid-19 Vaccination Clinic  Name:  DEMARKO LUMBARD    MRN: [id number] DOB: Jul 11, 1925  01/30/2020  Mr. Pearce was observed post Covid-19 immunization for 15 minutes without incident. He was provided with Vaccine Information Sheet and instruction to access the V-Safe system.   Mr. Gudaitis was instructed to call 911 with any severe reactions post vaccine: Marland Kitchen Difficulty breathing  . Swelling of face and throat  . A fast heartbeat  . A bad rash all over body  . Dizziness and weakness   Immunizations Administered    Name Date Dose VIS Date Route   Pfizer COVID-19 Vaccine 01/30/2020  9:51 AM 0.3 mL 10/26/2019 Intramuscular   Manufacturer: [place]   Lot: [id number]   [place]: [id number]

## 2020-02-08 NOTE — Progress Notes (Signed)
HPI: Follow-up atrial fibrillation. Had cardiac catheterization in August 2011 that showed nonobstructive coronary disease. Abdominal ultrasound July 2014 showed no aneurysm. Echocardiogram repeated 2/18. LV function normal; mild AS, mild to moderate MR, moderate LAE and mild to moderate AI. Patient had successful cardioversion again on 02/24/2017.Holter monitor December 2018 showed atrial fibrillation with elevated rate. Toprol 25 mg daily added.PET scan December 2019 showed enlargement of the pulmonary artery, 3 mm right lung nodule, 4.1 cm ascending thoracic aorta. Since last seen,pt denies dyspnea, CP, palpitations or syncope.  Current Outpatient Medications  Medication Sig Dispense Refill  . amiodarone (PACERONE) 200 MG tablet TAKE 1 TABLET(200 MG) BY MOUTH DAILY 90 tablet 1  . atorvastatin (LIPITOR) 10 MG tablet TAKE 1 TABLET BY MOUTH EVERY DAY 90 tablet 3  . calcium-vitamin D (OSCAL WITH D) 500-200 MG-UNIT tablet Take 1 tablet by mouth 2 (two) times daily. 60 tablet 6  . ELIQUIS 2.5 MG TABS tablet TAKE 1 TABLET BY MOUTH TWICE DAILY 180 tablet 1  . metoprolol succinate (TOPROL XL) 25 MG 24 hr tablet Take 0.5 tablets (12.5 mg total) by mouth daily. 45 tablet 1  . primidone (MYSOLINE) 50 MG tablet TAKE 1 TABLET BY MOUTH TWICE DAILY 10 AM AND 5 PM. 60 tablet 2  . senna (SENOKOT) 8.6 MG tablet Take 1 tablet by mouth daily as needed for constipation.     No current facility-administered medications for this visit.     Past Medical History:  Diagnosis Date  . Acute bronchitis treated with antibiotics in the past 60 days 05/25/2017   Persistent cough despite two courses of antibiotics 05/25/17  . Anemia   . Aortic insufficiency    Mild, echo, July, 2011  . Aortic stenosis    Mild, echo, July, 2011  . Atrial fibrillation [facility]) 2011, 2015   Multaq Started September, 2011, dose adjusted October, 2011, 200 mg a.m., 400 mg p.o.  . Bradycardia    Sinus bradycardia while on 25  Lopressor twice a day October, 2013  . Carotid artery disease ([place])    Doppler, 2008 no significant abnormality  . Cervical spine disease   . Chronic anticoagulation 01/27/2015   pradaxa 75 mg BID for A fib  . Coronary artery disease    minimal, cath 2005 / catheterization August, 2011, 30% in 3 vessels, normal LV function  . Diarrhea    Chronic  . Drug therapy    Pradaxa Started July, 2011  . Ejection fraction    EF 60%, echo, 2009  /  TEE normal August, 2011 /   EF 60%, echo, July, 2011  . GERD (gastroesophageal reflux disease)    Esophageal dilatation in 1992, some esophageal spasm  . Hyperlipidemia   . Incomplete right bundle branch block    August, 2012  . Leg fatigue    June, 2014  . Memory change    June, 2014  . Monoclonal gammopathy    granfortuna  . MR (mitral regurgitation) 2009   mild, Echo, July, 2011  . Painless hematuria 03/27/2012   Occurred x 2 5/12 & 5/13 on Pradaxa 75 mg BID  . Prostate cancer ([place]) 11/22/2011  . Renal insufficiency    Prior creatinine 1.2 /  September, 2011.. 1.7  /  October, 2011.. 1.8  . Rib pain on right side 09/25/2018   Suspect path fx 09/23/18  . Right rib fracture 09/25/2018   09/25/18 non traumatic; not obviously pathologic  . S/P transurethral resection of prostate 2004  2004  . TIA (transient ischemic attack)    Dr. Erling Cruz,, question in the past. when off ASA for 10 days    Past Surgical History:  Procedure Laterality Date  . CARDIAC CATHETERIZATION    . CARDIOVERSION  08/01/2012   Procedure: CARDIOVERSION;  Surgeon: Carlena Bjornstad, MD;  Location: [facility] ENDOSCOPY;  Service: Cardiovascular;  Laterality: N/A;  . CARDIOVERSION N/A 11/29/2013   Procedure: CARDIOVERSION;  Surgeon: Carlena Bjornstad, MD;  Location[facility] ENDOSCOPY;  Service: Cardiovascular;  Laterality: N/A;  . CARDIOVERSION N/A 11/27/2014   Procedure: CARDIOVERSION;  Surgeon: Carlena Bjornstad, MD;  Location: [facility] ENDOSCOPY;  Service: Cardiovascular;  Laterality: N/A;  .  CARDIOVERSION N/A 03/12/2015   Procedure: CARDIOVERSION;  Surgeon: Carlena Bjornstad, MD;  Location: [place];  Service: Cardiovascular;  Laterality: N/A;  . CARDIOVERSION N/A 12/12/2015   Procedure: CARDIOVERSION;  Surgeon: Fay Records, MD;  Location: [place];  Service: Cardiovascular;  Laterality: N/A;  . CARDIOVERSION N/A 02/24/2017   Procedure: CARDIOVERSION;  Surgeon: Pixie Casino, MD;  Location[facility] ENDOSCOPY;  Service: Cardiovascular;  Laterality: N/A;  . CATARACT EXTRACTION    . COLONOSCOPY    . EYE SURGERY Bilateral    cataract extraction  . Fibular fracture repair    . HERNIA REPAIR    . History of TURP    . OPEN REDUCTION INTERNAL FIXATION (ORIF) METACARPAL Left 06/22/2014   Procedure: OPEN REDUCTION INTERNAL FIXATION (ORIF) LEFT HAND METACARPAL;  Surgeon: Roseanne Kaufman, MD;  Location: [place];  Service: Orthopedics;  Laterality: Left;  . ORIF ORBITAL FRACTURE Bilateral 06/22/2014   Procedure: OPEN REDUCTION INTERNAL FIXATION (ORIF) BILATERAL LEFORTE1 FRACTURE OF MAXILLA, HYBRID ARCH BARS ;  Surgeon: Izora Gala, MD;  Location: [place];  Service: ENT;  Laterality: Bilateral;  . SHOULDER ARTHROSCOPY WITH ROTATOR CUFF REPAIR Right 01/23/2014   DR Noemi Chapel   . SHOULDER ARTHROSCOPY WITH ROTATOR CUFF REPAIR Right 01/23/2014   Procedure: RIGHT SHOULDER ARTHROSCOPY WITH DEBRIDEMENT AND ROTATOR CUFF REPAIR;  Surgeon: Lorn Junes, MD;  Location: [place];  Service: Orthopedics;  Laterality: Right;    Social History   Socioeconomic History  . Marital status: Widowed    Spouse name: Not on file  . Number of children: Not on file  . Years of education: Not on file  . Highest education level: Not on file  Occupational History  . Occupation: retired    Comment: physician  Tobacco Use  . Smoking status: Former Research scientist (life sciences)  . Smokeless tobacco: Never Used  Substance and Sexual Activity  . Alcohol use: Yes    Alcohol/week: 2.0 standard drinks    Types: 1 Glasses of wine, 1 Shots of liquor  per week    Comment: Evenings.  . Drug use: No  . Sexual activity: Not Currently  Other Topics Concern  . Not on file  Social History Narrative  . Not on file   Social Determinants of Health   Financial Resource Strain:   . Difficulty of Paying Living Expenses:   Food Insecurity:   . Worried About Charity fundraiser in the Last Year:   . Arboriculturist in the Last Year:   Transportation Needs:   . Film/video editor (Medical):   Marland Kitchen Lack of Transportation (Non-Medical):   Physical Activity:   . Days of Exercise per Week:   . Minutes of Exercise per Session:   Stress:   . Feeling of Stress :   Social Connections:   . Frequency  of Communication with Friends and Family:   . Frequency of Social Gatherings with Friends and Family:   . Attends Religious Services:   . Active Member of Clubs or Organizations:   . Attends Archivist Meetings:   Marland Kitchen Marital Status:   Intimate Partner Violence:   . Fear of Current or Ex-Partner:   . Emotionally Abused:   Marland Kitchen Physically Abused:   . Sexually Abused:     History reviewed. No pertinent family history.  ROS: no fevers or chills, productive cough, hemoptysis, dysphasia, odynophagia, melena, hematochezia, dysuria, hematuria, rash, seizure activity, orthopnea, PND, pedal edema, claudication. Remaining systems are negative.  Physical Exam: Well-developed frail in no acute distress.  Skin is warm and dry.  HEENT is normal.  Neck is supple.  Chest is clear to auscultation with normal expansion.  Cardiovascular exam is irregular, 2/6 systolic murmur left sternal border. Abdominal exam nontender or distended. No masses palpated. Extremities show no edema. neuro grossly intact  ECG-atrial fibrillation at a rate of 97, left anterior fascicular block, right bundle branch block.  Personally reviewed  A/P  1 permanent atrial fibrillation-continue amiodarone and Toprol for rate control.  Patient did not tolerate higher doses of  Toprol in the past and therefore amiodarone has been continued to assist with rate control.  He is due to have laboratories drawn with Dr. Learta Codding in the near future.  He declines chest x-ray.  2 history of mild aortic stenosis/mild to moderate aortic insufficiency-we are treating conservatively given patient's age and overall medical condition.  He also expressed that he would never consider TAVR in the past and therefore follow-up echocardiograms will not be pursued.  3 thoracic aortic aneurysm-4.1 on most recent study.  We will not pursue further imaging as he would not be a candidate for aortic root replacement if needed.  4 hyperlipidemia-continue statin.  5 nonobstructive coronary disease-continue statin.  He is not on aspirin given need for apixaban.  Kirk Ruths, MD

## 2020-02-15 ENCOUNTER — Encounter: Payer: Self-pay | Admitting: Cardiology

## 2020-02-15 ENCOUNTER — Other Ambulatory Visit: Payer: Self-pay

## 2020-02-15 ENCOUNTER — Ambulatory Visit (INDEPENDENT_AMBULATORY_CARE_PROVIDER_SITE_OTHER): Payer: Medicare PPO | Admitting: Cardiology

## 2020-02-15 VITALS — BP 122/84 | HR 97 | Ht 68.0 in | Wt 192.4 lb

## 2020-02-15 DIAGNOSIS — E7800 Pure hypercholesterolemia, unspecified: Secondary | ICD-10-CM

## 2020-02-15 DIAGNOSIS — I4821 Permanent atrial fibrillation: Secondary | ICD-10-CM | POA: Diagnosis not present

## 2020-02-15 DIAGNOSIS — I350 Nonrheumatic aortic (valve) stenosis: Secondary | ICD-10-CM

## 2020-02-15 NOTE — Patient Instructions (Signed)

## 2020-02-19 ENCOUNTER — Inpatient Hospital Stay: Payer: Medicare PPO

## 2020-02-19 ENCOUNTER — Other Ambulatory Visit: Payer: Self-pay

## 2020-02-19 ENCOUNTER — Inpatient Hospital Stay: Payer: Medicare PPO | Attending: Oncology | Admitting: Oncology

## 2020-02-19 VITALS — BP 152/91 | HR 73 | Temp 98.5°F | Resp 17 | Ht 68.0 in | Wt 187.7 lb

## 2020-02-19 DIAGNOSIS — R600 Localized edema: Secondary | ICD-10-CM | POA: Diagnosis not present

## 2020-02-19 DIAGNOSIS — D539 Nutritional anemia, unspecified: Secondary | ICD-10-CM | POA: Diagnosis not present

## 2020-02-19 DIAGNOSIS — N289 Disorder of kidney and ureter, unspecified: Secondary | ICD-10-CM | POA: Insufficient documentation

## 2020-02-19 DIAGNOSIS — I352 Nonrheumatic aortic (valve) stenosis with insufficiency: Secondary | ICD-10-CM | POA: Insufficient documentation

## 2020-02-19 DIAGNOSIS — I4891 Unspecified atrial fibrillation: Secondary | ICD-10-CM | POA: Diagnosis not present

## 2020-02-19 DIAGNOSIS — M1990 Unspecified osteoarthritis, unspecified site: Secondary | ICD-10-CM | POA: Diagnosis not present

## 2020-02-19 DIAGNOSIS — D472 Monoclonal gammopathy: Secondary | ICD-10-CM | POA: Diagnosis not present

## 2020-02-19 DIAGNOSIS — Z8546 Personal history of malignant neoplasm of prostate: Secondary | ICD-10-CM | POA: Diagnosis not present

## 2020-02-19 LAB — CBC WITH DIFFERENTIAL (CANCER CENTER ONLY)
Abs Immature Granulocytes: 0.03 10*3/uL (ref 0.00–0.07)
Basophils Absolute: 0.1 10*3/uL (ref 0.0–0.1)
Basophils Relative: 1 %
Eosinophils Absolute: 1 10*3/uL — ABNORMAL HIGH (ref 0.0–0.5)
Eosinophils Relative: 12 %
HCT: 37.5 % — ABNORMAL LOW (ref 39.0–52.0)
Hemoglobin: 12.5 g/dL — ABNORMAL LOW (ref 13.0–17.0)
Immature Granulocytes: 0 %
Lymphocytes Relative: 32 %
Lymphs Abs: 2.7 10*3/uL (ref 0.7–4.0)
MCH: 36 pg — ABNORMAL HIGH (ref 26.0–34.0)
MCHC: 33.3 g/dL (ref 30.0–36.0)
MCV: 108.1 fL — ABNORMAL HIGH (ref 80.0–100.0)
Monocytes Absolute: 0.7 10*3/uL (ref 0.1–1.0)
Monocytes Relative: 9 %
Neutro Abs: 3.8 10*3/uL (ref 1.7–7.7)
Neutrophils Relative %: 46 %
Platelet Count: 208 10*3/uL (ref 150–400)
RBC: 3.47 MIL/uL — ABNORMAL LOW (ref 4.22–5.81)
RDW: 13.3 % (ref 11.5–15.5)
WBC Count: 8.2 10*3/uL (ref 4.0–10.5)
nRBC: 0 % (ref 0.0–0.2)

## 2020-02-19 LAB — CMP (CANCER CENTER ONLY)
ALT: 25 U/L (ref 0–44)
AST: 26 U/L (ref 15–41)
Albumin: 3.4 g/dL — ABNORMAL LOW (ref 3.5–5.0)
Alkaline Phosphatase: 70 U/L (ref 38–126)
Anion gap: 5 (ref 5–15)
BUN: 17 mg/dL (ref 8–23)
CO2: 24 mmol/L (ref 22–32)
Calcium: 9.1 mg/dL (ref 8.9–10.3)
Chloride: 106 mmol/L (ref 98–111)
Creatinine: 1.55 mg/dL — ABNORMAL HIGH (ref 0.61–1.24)
GFR, Est AFR Am: 44 mL/min — ABNORMAL LOW (ref >60)
GFR, Estimated: 38 mL/min — ABNORMAL LOW (ref >60)
Glucose, Bld: 92 mg/dL (ref 70–99)
Potassium: 4.7 mmol/L (ref 3.5–5.1)
Sodium: 135 mmol/L (ref 135–145)
Total Bilirubin: 0.7 mg/dL (ref 0.3–1.2)
Total Protein: 8.4 g/dL — ABNORMAL HIGH (ref 6.5–8.1)

## 2020-02-19 LAB — TSH: TSH: 1.91 u[IU]/mL (ref 0.320–4.118)

## 2020-02-19 NOTE — Progress Notes (Signed)
  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  No new pain or recent infection.  He has "arthritis "pain at multiple sites.  He reports chronic edema at the right greater than left lower leg.  He saw Dr. Stanford Breed last week.  Objective:  Vital signs in last 24 hours:  Blood pressure (!) 152/91, pulse 73, temperature 98.5 F (36.9 C), temperature source Oral, resp. rate 17, height '5\' 8"'$  (1.727 m), weight 187 lb 11.2 oz (85.1 kg), SpO2 99 %.   Lymphatics: No cervical, supraclavicular, right axillary, or inguinal nodes.  Soft mobile 1 cm left axillary node GI: No hepatosplenomegaly, no mass, nontender Vascular: Pitting edema at the right greater than left lower leg and ankle    Lab Results:  Lab Results  Component Value Date   WBC 8.2 02/19/2020   HGB 12.5 (L) 02/19/2020   HCT 37.5 (L) 02/19/2020   MCV 108.1 (H) 02/19/2020   PLT 208 02/19/2020   NEUTROABS 3.8 02/19/2020    CMP  Lab Results  Component Value Date   NA 135 02/19/2020   K 4.7 02/19/2020   CL 106 02/19/2020   CO2 24 02/19/2020   GLUCOSE 92 02/19/2020   BUN 17 02/19/2020   CREATININE 1.55 (H) 02/19/2020   CALCIUM 9.1 02/19/2020   PROT 8.4 (H) 02/19/2020   ALBUMIN 3.4 (L) 02/19/2020   AST 26 02/19/2020   ALT 25 02/19/2020   ALKPHOS 70 02/19/2020   BILITOT 0.7 02/19/2020   GFRNONAA 38 (L) 02/19/2020   GFRAA 44 (L) 02/19/2020     Medications: I have reviewed the patient's current medications.   Assessment/Plan: 1. IgG kappa serum monoclonal protein  2. Mild macrocytic anemia- chronic 3. Renal insufficiency 4. Atrial fibrillation 5. History of prostate cancer- treated with radiofrequency ablation in February 2000 6. Aortic stenosis/aortic insufficiency 7. 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  He is stable from a hematologic standpoint.  There is no clinical  evidence for progression to multiple myeloma or another lymphoproliferative disorder.  We will follow up on the IgG level from today.  He will return for an office and lab visit in 8 months.  He has received both doses of the COVID-19 vaccine.  Betsy Coder, MD  02/19/2020  12:30 PM

## 2020-02-20 LAB — KAPPA/LAMBDA LIGHT CHAINS
Kappa free light chain: 103.1 mg/L — ABNORMAL HIGH (ref 3.3–19.4)
Kappa, lambda light chain ratio: 8.12 — ABNORMAL HIGH (ref 0.26–1.65)
Lambda free light chains: 12.7 mg/L (ref 5.7–26.3)

## 2020-02-20 LAB — PROSTATE-SPECIFIC AG, SERUM (LABCORP): Prostate Specific Ag, Serum: 2.6 ng/mL (ref 0.0–4.0)

## 2020-02-21 ENCOUNTER — Telehealth: Payer: Self-pay | Admitting: Oncology

## 2020-02-21 LAB — IMMUNOFIXATION ELECTROPHORESIS
IgA: 69 mg/dL (ref 61–437)
IgG (Immunoglobin G), Serum: 3461 mg/dL — ABNORMAL HIGH (ref 603–1613)
IgM (Immunoglobulin M), Srm: 76 mg/dL (ref 15–143)
Total Protein ELP: 8.1 g/dL (ref 6.0–8.5)

## 2020-02-21 NOTE — Telephone Encounter (Signed)
Scheduled per los. Called and left msg. Mailed printout  °

## 2020-03-11 ENCOUNTER — Other Ambulatory Visit: Payer: Self-pay | Admitting: Internal Medicine

## 2020-03-11 ENCOUNTER — Other Ambulatory Visit: Payer: Self-pay | Admitting: Oncology

## 2020-04-10 ENCOUNTER — Other Ambulatory Visit: Payer: Self-pay | Admitting: Oncology

## 2020-04-10 ENCOUNTER — Other Ambulatory Visit: Payer: Self-pay

## 2020-04-10 DIAGNOSIS — I4891 Unspecified atrial fibrillation: Secondary | ICD-10-CM

## 2020-04-11 MED ORDER — AMIODARONE HCL 200 MG PO TABS: ORAL_TABLET | ORAL | 2 refills | Status: AC

## 2020-07-02 IMAGING — DX DG BONE SURVEY MET
9 of 10 series · 9 of 10 positions shown · non-contrast
Comparison: July 25, 2018.

CLINICAL DATA: History of serum monoclonal protein with possible
bone lesions.

EXAM:
METASTATIC BONE SURVEY

[skull lat]
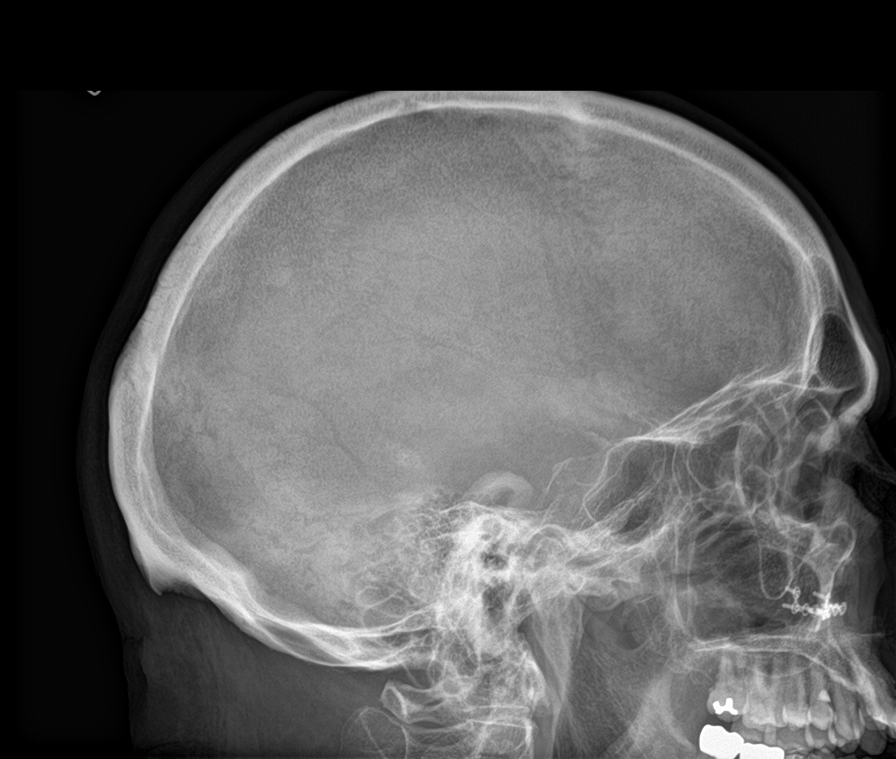

[shoulder ap (1 of 2)]
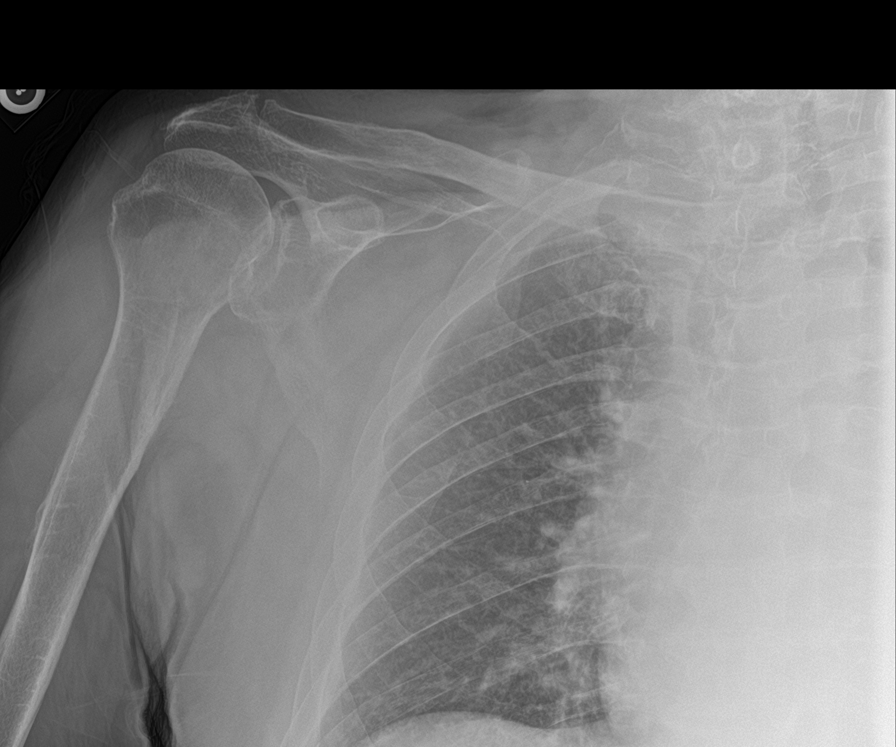

[shoulder ap (2 of 2)]
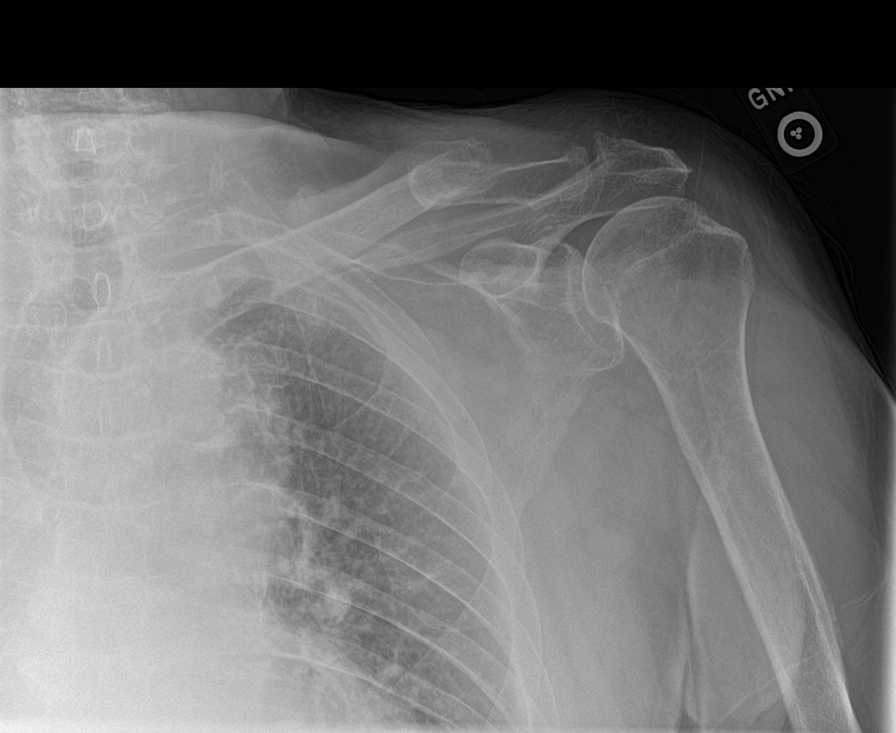

[humerus ap (1 of 2)]
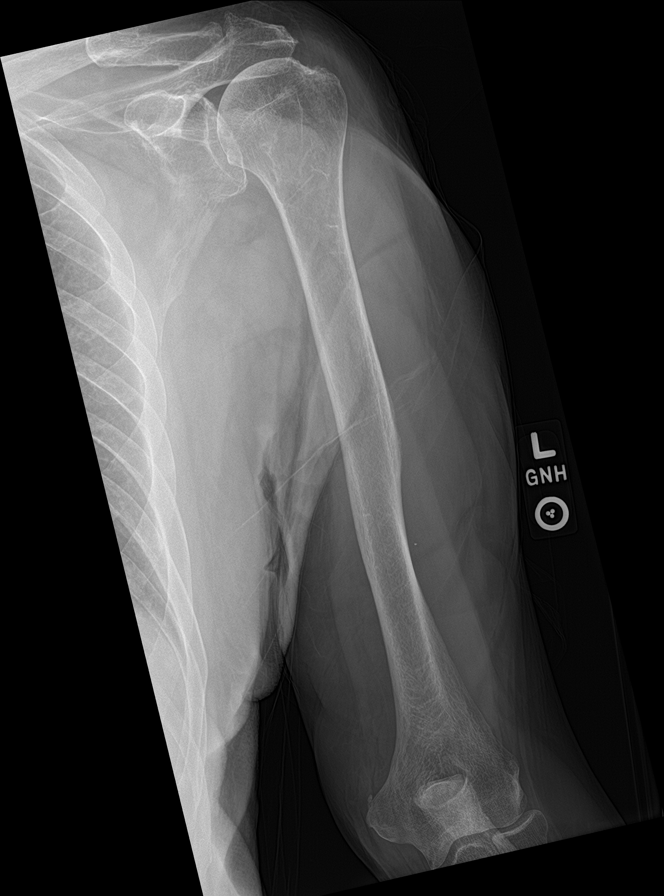

[humerus ap (2 of 2)]
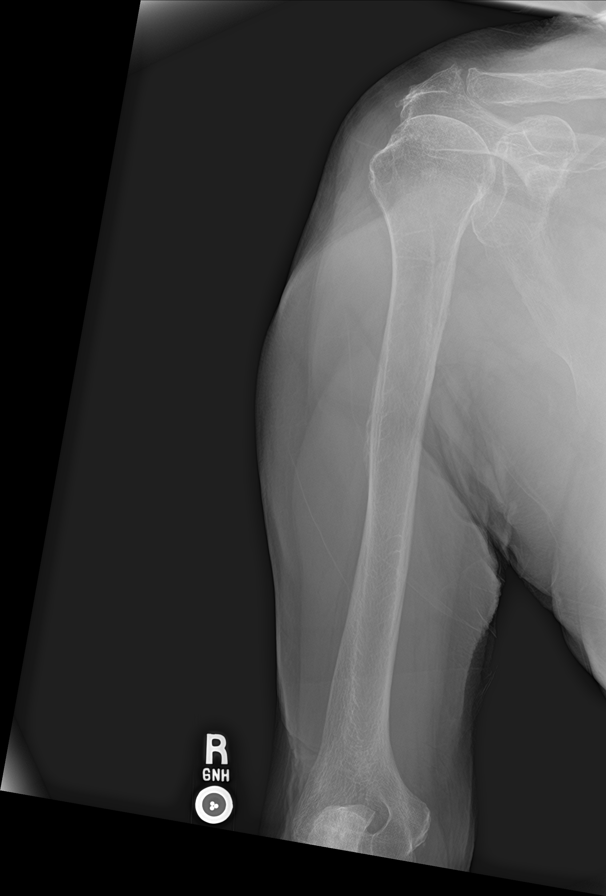

[forearm ap (1 of 2)]
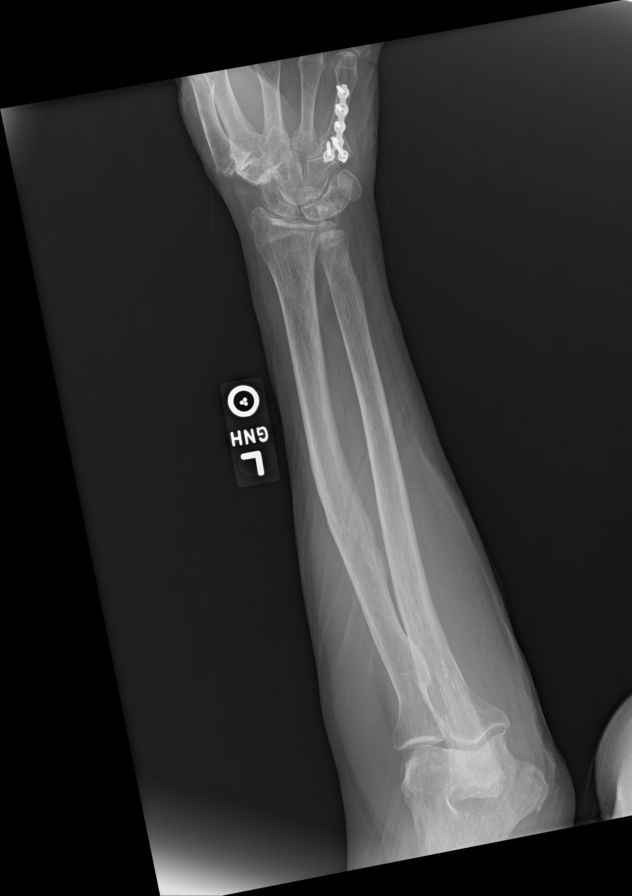

[forearm ap (2 of 2)]
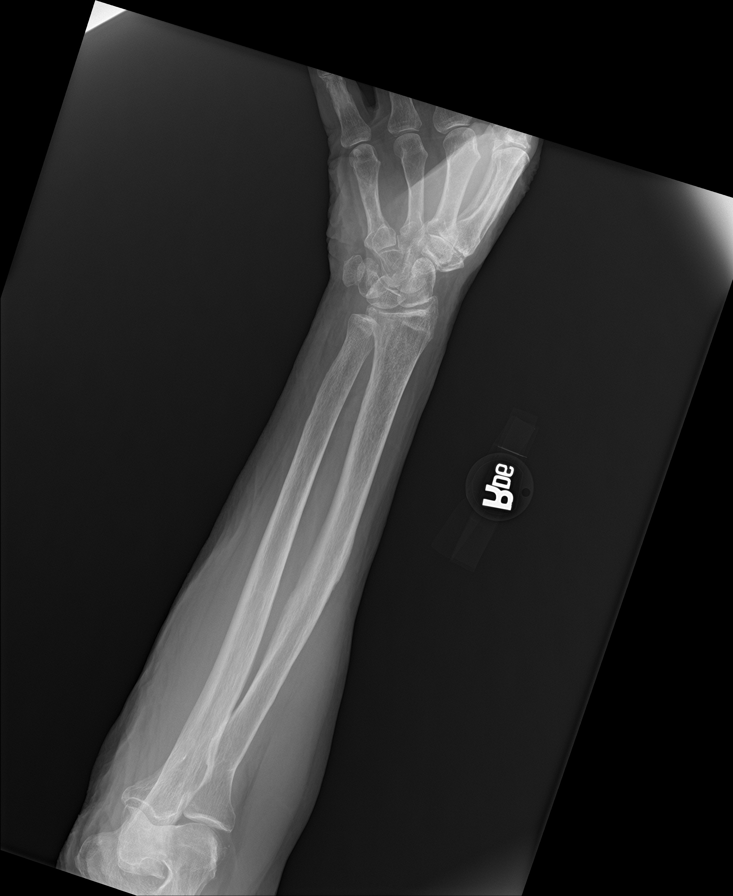

[c-spine ap]
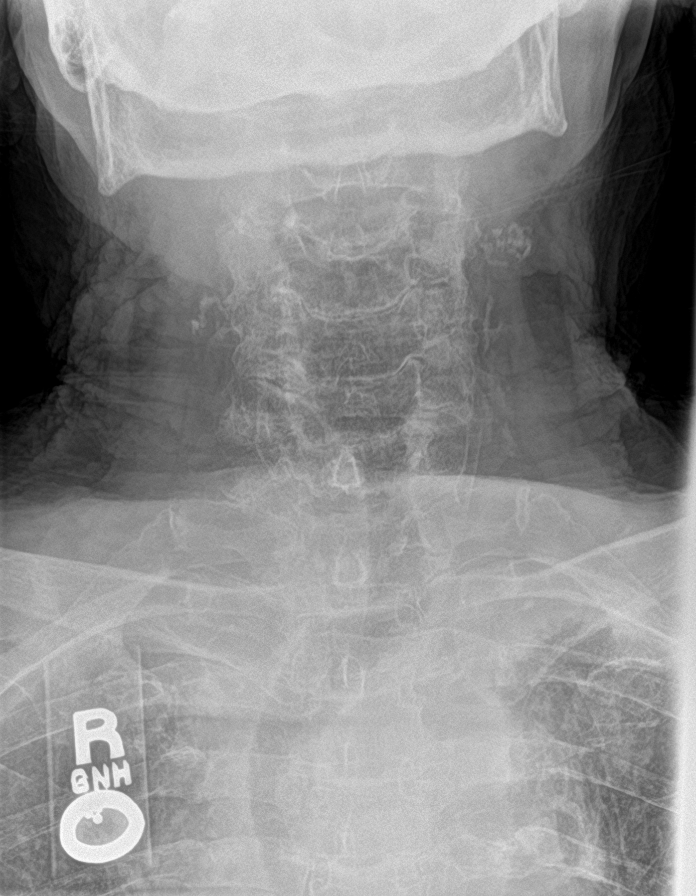

[c-spine lat]
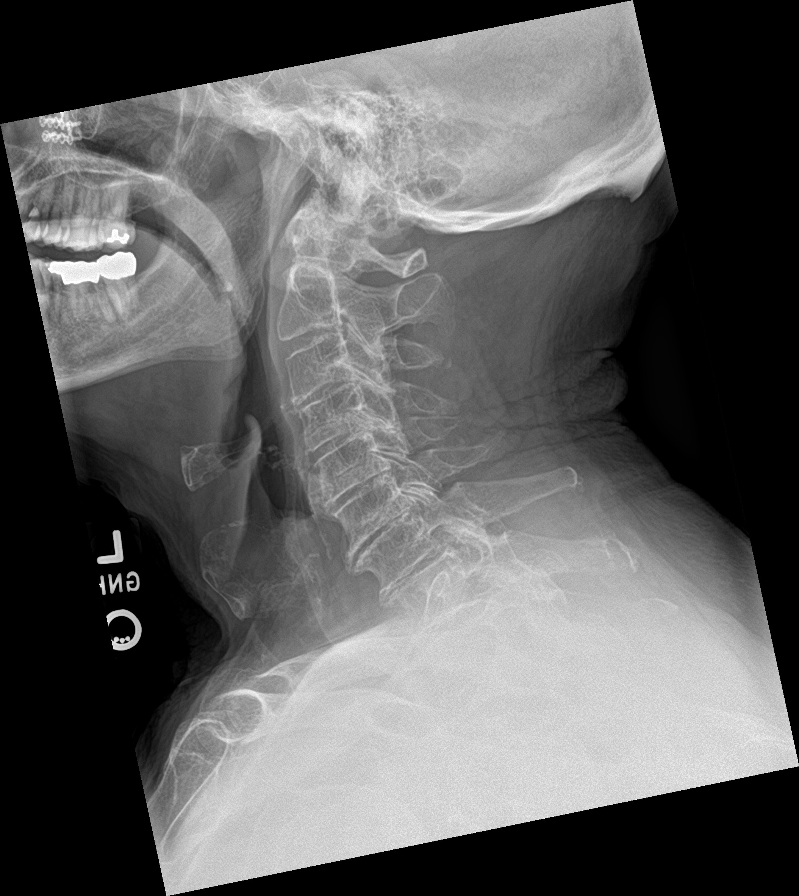

[9 of 10 positions shown; findings below may reference images not displayed]

FINDINGS: Old left clavicular fracture is noted. Multilevel degenerative
changes are noted in the cervical, thoracic and lumbar spine.
Vascular calcifications are noted. Status post surgical internal
fixation of distal left fibula. Atherosclerosis of abdominal aorta
is noted. No definite lytic or sclerotic lesion is seen within the
skeleton.
IMPRESSION: No definite lytic or sclerotic lesion is seen within the skeleton.

## 2020-07-07 ENCOUNTER — Other Ambulatory Visit: Payer: Self-pay | Admitting: Cardiology

## 2020-07-08 ENCOUNTER — Other Ambulatory Visit: Payer: Self-pay | Admitting: *Deleted

## 2020-07-08 MED ORDER — PRIMIDONE 50 MG PO TABS: ORAL_TABLET | ORAL | 0 refills | Status: AC

## 2020-08-20 ENCOUNTER — Other Ambulatory Visit: Payer: Self-pay | Admitting: Oncology

## 2020-08-26 ENCOUNTER — Ambulatory Visit: Payer: Medicare PPO | Admitting: Podiatry

## 2020-08-28 ENCOUNTER — Telehealth: Payer: Self-pay | Admitting: *Deleted

## 2020-08-28 NOTE — Telephone Encounter (Signed)
Left VM requesting to speak w/Dr. Benay Spice regarding his decline in condition and discuss potential hospice referral. Family had requested he evaluate patient today. Dr. Benay Spice notified.

## 2020-08-29 ENCOUNTER — Telehealth: Payer: Self-pay | Admitting: *Deleted

## 2020-08-29 NOTE — Telephone Encounter (Signed)
Called daughter, [name] and confirmed that he needs Hospice referral "asap". Had made a dramatic decline over the past 24 hours. Needs to be seen today. Gave daughter phone # for [facility] to f/u on referral. Sherian Maroon at [facility] with referral and need to be seen asap today--seems to be actively dying. DX: aortic stenosis and renal failure and he is considered terminal. Provided contact information for daughter.

## 2020-09-01 ENCOUNTER — Encounter: Payer: Self-pay | Admitting: *Deleted

## 2020-09-01 NOTE — Progress Notes (Signed)
Received fax from [facility] that patient died on 2020/09/10. MD notified.

## 2020-09-15 DEATH — deceased

## 2020-10-20 ENCOUNTER — Ambulatory Visit: Payer: Medicare PPO | Admitting: Oncology

## 2020-10-20 ENCOUNTER — Other Ambulatory Visit: Payer: Medicare PPO

## 2020-12-01 ENCOUNTER — Ambulatory Visit: Payer: Medicare PPO | Admitting: Cardiology
